# Patient Record
Sex: Male | Born: 1935 | Race: White | Hispanic: No | Marital: Married | Smoking: Former smoker
Health system: Southern US, Community
[De-identification: ages and names within clinical notes are randomized; demographics above are authoritative.]

## PROBLEM LIST (undated history)

## (undated) DIAGNOSIS — K219 Gastro-esophageal reflux disease without esophagitis: Secondary | ICD-10-CM

## (undated) DIAGNOSIS — F4310 Post-traumatic stress disorder, unspecified: Secondary | ICD-10-CM

## (undated) DIAGNOSIS — I6521 Occlusion and stenosis of right carotid artery: Secondary | ICD-10-CM

## (undated) DIAGNOSIS — J439 Emphysema, unspecified: Secondary | ICD-10-CM

## (undated) DIAGNOSIS — G5793 Unspecified mononeuropathy of bilateral lower limbs: Secondary | ICD-10-CM

## (undated) DIAGNOSIS — R0602 Shortness of breath: Secondary | ICD-10-CM

## (undated) DIAGNOSIS — G459 Transient cerebral ischemic attack, unspecified: Secondary | ICD-10-CM

## (undated) DIAGNOSIS — E785 Hyperlipidemia, unspecified: Secondary | ICD-10-CM

## (undated) DIAGNOSIS — J189 Pneumonia, unspecified organism: Secondary | ICD-10-CM

## (undated) DIAGNOSIS — D649 Anemia, unspecified: Secondary | ICD-10-CM

## (undated) DIAGNOSIS — N400 Enlarged prostate without lower urinary tract symptoms: Secondary | ICD-10-CM

## (undated) DIAGNOSIS — M1990 Unspecified osteoarthritis, unspecified site: Secondary | ICD-10-CM

## (undated) DIAGNOSIS — C801 Malignant (primary) neoplasm, unspecified: Secondary | ICD-10-CM

## (undated) DIAGNOSIS — J449 Chronic obstructive pulmonary disease, unspecified: Secondary | ICD-10-CM

## (undated) DIAGNOSIS — I10 Essential (primary) hypertension: Secondary | ICD-10-CM

## (undated) DIAGNOSIS — N3941 Urge incontinence: Secondary | ICD-10-CM

## (undated) DIAGNOSIS — L723 Sebaceous cyst: Secondary | ICD-10-CM

## (undated) HISTORY — DX: Gastro-esophageal reflux disease without esophagitis: K21.9

## (undated) HISTORY — PX: CYST EXCISION: SHX5701

## (undated) HISTORY — DX: Unspecified osteoarthritis, unspecified site: M19.90

## (undated) HISTORY — PX: SINUSOTOMY: SHX291

## (undated) HISTORY — DX: Sebaceous cyst: L72.3

## (undated) HISTORY — DX: Chronic obstructive pulmonary disease, unspecified: J44.9

## (undated) HISTORY — PX: HEMORRHOID SURGERY: SHX153

## (undated) HISTORY — DX: Emphysema, unspecified: J43.9

## (undated) HISTORY — DX: Anemia, unspecified: D64.9

## (undated) HISTORY — DX: Pneumonia, unspecified organism: J18.9

## (undated) HISTORY — DX: Essential (primary) hypertension: I10

## (undated) HISTORY — DX: Hyperlipidemia, unspecified: E78.5

---

## 2004-06-08 ENCOUNTER — Other Ambulatory Visit: Payer: Self-pay

## 2007-07-09 ENCOUNTER — Emergency Department: Payer: Self-pay | Admitting: Emergency Medicine

## 2008-07-27 ENCOUNTER — Ambulatory Visit: Payer: Self-pay | Admitting: Family Medicine

## 2009-07-11 ENCOUNTER — Ambulatory Visit: Payer: Self-pay | Admitting: Internal Medicine

## 2009-09-19 ENCOUNTER — Ambulatory Visit: Payer: Self-pay | Admitting: Gastroenterology

## 2010-04-25 ENCOUNTER — Ambulatory Visit: Payer: Self-pay | Admitting: Internal Medicine

## 2011-08-05 ENCOUNTER — Ambulatory Visit (INDEPENDENT_AMBULATORY_CARE_PROVIDER_SITE_OTHER): Payer: Medicare Other | Admitting: Internal Medicine

## 2011-08-05 DIAGNOSIS — Z23 Encounter for immunization: Secondary | ICD-10-CM

## 2011-08-14 ENCOUNTER — Other Ambulatory Visit: Payer: Self-pay | Admitting: Internal Medicine

## 2011-08-14 MED ORDER — AMLODIPINE BESYLATE 5 MG PO TABS: 5.0000 mg | ORAL_TABLET | Freq: Two times a day (BID) | ORAL | Status: DC

## 2011-08-14 NOTE — Telephone Encounter (Signed)
I phoned the Rx to [facility].

## 2011-08-14 NOTE — Telephone Encounter (Signed)
Patient stated pharmacy [facility]) faxed over a refill for amlodopine on Monday. Please call patient when complete. Patient upset and states that he always has issues with his refills. Please follow up.

## 2011-08-16 ENCOUNTER — Other Ambulatory Visit: Payer: Self-pay | Admitting: Internal Medicine

## 2011-09-10 ENCOUNTER — Ambulatory Visit (INDEPENDENT_AMBULATORY_CARE_PROVIDER_SITE_OTHER): Payer: Medicare Other | Admitting: Internal Medicine

## 2011-09-10 ENCOUNTER — Encounter: Payer: Self-pay | Admitting: Internal Medicine

## 2011-09-10 VITALS — BP 140/70 | HR 77 | Temp 98.6°F | Resp 16 | Ht 68.0 in | Wt 177.0 lb

## 2011-09-10 DIAGNOSIS — E785 Hyperlipidemia, unspecified: Secondary | ICD-10-CM

## 2011-09-10 DIAGNOSIS — I10 Essential (primary) hypertension: Secondary | ICD-10-CM

## 2011-09-10 MED ORDER — AMLODIPINE BESYLATE 5 MG PO TABS: 5.0000 mg | ORAL_TABLET | Freq: Two times a day (BID) | ORAL | Status: DC

## 2011-09-10 NOTE — Progress Notes (Signed)
  Subjective:    Patient ID: Donald [name], male    DOB: 1933-05-29, 77 y.o.   MRN: [id number]  HPI 77YO male with history of hypertension presents for followup. He reports that he's been feeling well. He denies any chest pain, palpitations, diaphoresis. He reports full compliance with his medications. When he checks his blood pressure, it typically runs less than 140/80. He notes occasional headache which he describes as fleeting lasting only a few minutes with no other associated symptoms. Headaches are described as mild and diffuse. He has not taken any medication for this.  Outpatient Encounter Prescriptions as of 09/10/2011  Medication Sig Dispense Refill  . amLODipine (NORVASC) 5 MG tablet Take 1 tablet (5 mg total) by mouth 2 (two) times daily.  180 tablet  4  . citalopram (CELEXA) 20 MG tablet Take 20 mg by mouth daily.        Marland Kitchen dutasteride (AVODART) 0.5 MG capsule Take 0.5 mg by mouth. Takes twice a week        . omeprazole (PRILOSEC) 20 MG capsule Take 20 mg by mouth daily.          Review of Systems  Constitutional: Negative for fever, chills, activity change, appetite change, fatigue and unexpected weight change.  Eyes: Negative for visual disturbance.  Respiratory: Negative for cough and shortness of breath.   Cardiovascular: Negative for chest pain, palpitations and leg swelling.  Gastrointestinal: Negative for abdominal pain and abdominal distention.  Genitourinary: Negative for dysuria, urgency and difficulty urinating.  Musculoskeletal: Negative for arthralgias and gait problem.  Skin: Negative for color change and rash.  Neurological: Positive for headaches (occasional, fleeting, does not take medication for).  Hematological: Negative for adenopathy.  Psychiatric/Behavioral: Negative for sleep disturbance and dysphoric mood. The patient is not nervous/anxious.    BP 140/70  Pulse 77  Temp(Src) 98.6 F (37 C) (Oral)  Resp 16  Ht 5\' 8"  (1.727 m)  Wt 177 lb (80.287  kg)  BMI 26.91 kg/m2  SpO2 99%     Objective:   Physical Exam  Constitutional: He is oriented to person, place, and time. He appears well-developed and well-nourished. No distress.  HENT:  Head: Normocephalic and atraumatic.  Right Ear: External ear normal.  Left Ear: External ear normal.  Nose: Nose normal.  Mouth/Throat: Oropharynx is clear and moist. No oropharyngeal exudate.  Eyes: Conjunctivae and EOM are normal. Pupils are equal, round, and reactive to light. Right eye exhibits no discharge. Left eye exhibits no discharge. No scleral icterus.  Neck: Normal range of motion. Neck supple. No tracheal deviation present. No thyromegaly present.  Cardiovascular: Normal rate, regular rhythm and normal heart sounds.  Exam reveals no gallop and no friction rub.   No murmur heard. Pulmonary/Chest: Effort normal and breath sounds normal. No respiratory distress. He has no wheezes. He has no rales. He exhibits no tenderness.  Musculoskeletal: Normal range of motion. He exhibits no edema.  Lymphadenopathy:    He has no cervical adenopathy.  Neurological: He is alert and oriented to person, place, and time. No cranial nerve deficit. Coordination normal.  Skin: Skin is warm and dry. No rash noted. He is not diaphoretic. No erythema. No pallor.  Psychiatric: He has a normal mood and affect. His behavior is normal. Judgment and thought content normal.          Assessment & Plan:  1. Hypertension - Well controlled on Amlodipine. Will check renal function with labs today. Follow up in 6 months.

## 2011-09-11 ENCOUNTER — Other Ambulatory Visit (INDEPENDENT_AMBULATORY_CARE_PROVIDER_SITE_OTHER): Payer: Medicare Other | Admitting: *Deleted

## 2011-09-11 DIAGNOSIS — E785 Hyperlipidemia, unspecified: Secondary | ICD-10-CM

## 2011-09-11 DIAGNOSIS — I10 Essential (primary) hypertension: Secondary | ICD-10-CM

## 2011-09-11 LAB — COMPREHENSIVE METABOLIC PANEL
ALT: 18 U/L (ref 0–53)
AST: 26 U/L (ref 0–37)
Albumin: 4.5 g/dL (ref 3.5–5.2)
Alkaline Phosphatase: 60 U/L (ref 39–117)
Potassium: 4.4 mEq/L (ref 3.5–5.1)
Sodium: 136 mEq/L (ref 135–145)
Total Bilirubin: 0.4 mg/dL (ref 0.3–1.2)
Total Protein: 7.8 g/dL (ref 6.0–8.3)

## 2011-09-11 LAB — CBC WITH DIFFERENTIAL/PLATELET
Basophils Absolute: 0.1 10*3/uL (ref 0.0–0.1)
Eosinophils Absolute: 0.6 10*3/uL (ref 0.0–0.7)
Eosinophils Relative: 5.4 % — ABNORMAL HIGH (ref 0.0–5.0)
MCHC: 33.2 g/dL (ref 30.0–36.0)
MCV: 90.3 fl (ref 78.0–100.0)
Monocytes Absolute: 0.6 10*3/uL (ref 0.1–1.0)
Neutrophils Relative %: 51.7 % (ref 43.0–77.0)
Platelets: 269 10*3/uL (ref 150.0–400.0)
RDW: 13.7 % (ref 11.5–14.6)
WBC: 10.3 10*3/uL (ref 4.5–10.5)

## 2011-09-11 LAB — LIPID PANEL
Total CHOL/HDL Ratio: 4
VLDL: 35.6 mg/dL (ref 0.0–40.0)

## 2011-09-11 LAB — LDL CHOLESTEROL, DIRECT: Direct LDL: 166.8 mg/dL

## 2011-09-13 ENCOUNTER — Telehealth: Payer: Self-pay | Admitting: *Deleted

## 2011-09-13 MED ORDER — ATORVASTATIN CALCIUM 20 MG PO TABS: 20.0000 mg | ORAL_TABLET | Freq: Every day | ORAL | Status: DC

## 2011-09-13 NOTE — Telephone Encounter (Signed)
Message copied by Vernie Murders on Fri Sep 13, 2011  6:59 PM ------      Message from: Ronna Polio A      Created: Fri Sep 13, 2011  4:13 PM       Start Lipitor 20mg  po daily, disp 30 with 3 refill. Repeat lipids and LFTs in 1 month.

## 2011-10-14 ENCOUNTER — Other Ambulatory Visit (INDEPENDENT_AMBULATORY_CARE_PROVIDER_SITE_OTHER): Payer: Medicare Other | Admitting: *Deleted

## 2011-10-14 DIAGNOSIS — E785 Hyperlipidemia, unspecified: Secondary | ICD-10-CM

## 2011-10-14 DIAGNOSIS — Z79899 Other long term (current) drug therapy: Secondary | ICD-10-CM

## 2011-10-14 LAB — LIPID PANEL
HDL: 51.3 mg/dL (ref >39.00)
Total CHOL/HDL Ratio: 3
Triglycerides: 116 mg/dL (ref 0.0–149.0)
VLDL: 23.2 mg/dL (ref 0.0–40.0)

## 2011-10-14 LAB — HEPATIC FUNCTION PANEL
Albumin: 4 g/dL (ref 3.5–5.2)
Alkaline Phosphatase: 61 U/L (ref 39–117)
Bilirubin, Direct: 0 mg/dL (ref 0.0–0.3)

## 2011-10-15 ENCOUNTER — Other Ambulatory Visit: Payer: Medicare Other

## 2011-10-22 ENCOUNTER — Telehealth: Payer: Self-pay | Admitting: *Deleted

## 2011-10-22 NOTE — Telephone Encounter (Signed)
Thanks. Cholesterol is excellent.

## 2011-10-22 NOTE — Telephone Encounter (Signed)
Wife was in for OV today and dropped off lab readings from the [place].  Total Cholesterol 153 LDL  71 HDL  58 Trig  118

## 2011-11-07 ENCOUNTER — Encounter: Payer: Self-pay | Admitting: Internal Medicine

## 2011-12-11 ENCOUNTER — Ambulatory Visit (INDEPENDENT_AMBULATORY_CARE_PROVIDER_SITE_OTHER): Payer: Medicare Other | Admitting: Internal Medicine

## 2011-12-11 ENCOUNTER — Encounter: Payer: Self-pay | Admitting: Internal Medicine

## 2011-12-11 DIAGNOSIS — F32A Depression, unspecified: Secondary | ICD-10-CM

## 2011-12-11 DIAGNOSIS — I10 Essential (primary) hypertension: Secondary | ICD-10-CM

## 2011-12-11 DIAGNOSIS — E7849 Other hyperlipidemia: Secondary | ICD-10-CM | POA: Insufficient documentation

## 2011-12-11 DIAGNOSIS — K219 Gastro-esophageal reflux disease without esophagitis: Secondary | ICD-10-CM

## 2011-12-11 DIAGNOSIS — F329 Major depressive disorder, single episode, unspecified: Secondary | ICD-10-CM

## 2011-12-11 DIAGNOSIS — E785 Hyperlipidemia, unspecified: Secondary | ICD-10-CM

## 2011-12-11 DIAGNOSIS — N400 Enlarged prostate without lower urinary tract symptoms: Secondary | ICD-10-CM

## 2011-12-11 MED ORDER — AMLODIPINE BESYLATE 5 MG PO TABS: 5.0000 mg | ORAL_TABLET | Freq: Two times a day (BID) | ORAL | Status: DC

## 2011-12-11 MED ORDER — OMEPRAZOLE 20 MG PO CPDR: 20.0000 mg | DELAYED_RELEASE_CAPSULE | Freq: Every day | ORAL | Status: DC

## 2011-12-11 MED ORDER — DUTASTERIDE 0.5 MG PO CAPS: 0.5000 mg | ORAL_CAPSULE | Freq: Every day | ORAL | Status: DC

## 2011-12-11 MED ORDER — CITALOPRAM HYDROBROMIDE 20 MG PO TABS: 20.0000 mg | ORAL_TABLET | Freq: Every day | ORAL | Status: DC

## 2011-12-11 MED ORDER — ATORVASTATIN CALCIUM 20 MG PO TABS: 20.0000 mg | ORAL_TABLET | Freq: Every day | ORAL | Status: DC

## 2011-12-11 NOTE — Progress Notes (Signed)
Subjective:    Patient ID: Donald [name], male    DOB: 12/28/1932, 78 y.o.   MRN: [id number]  HPI 78 year old male with history of hypertension and hyperlipidemia presents for followup. He reports that he has been feeling well. In regards to his blood pressure, he denies any recent chest pain, headache, or palpitations. He has been compliant with his medication. In regards to his cholesterol, he recently had lipid profile performed which showed marked improvement in his total cholesterol with reduction of 100 point and a total cholesterol of 145. His LDL cholesterol was 69. He has also been trying to improve his diet with decrease intake of saturated fat and increasing intake of fiber. He has been walking for exercise 2 miles daily. He denies any myalgia associated with statin medication.  Outpatient Encounter Prescriptions as of 12/11/2011  Medication Sig Dispense Refill  . amLODipine (NORVASC) 5 MG tablet Take 1 tablet (5 mg total) by mouth 2 (two) times daily.  180 tablet  4  . atorvastatin (LIPITOR) 20 MG tablet Take 1 tablet (20 mg total) by mouth daily.  30 tablet  6  . dutasteride (AVODART) 0.5 MG capsule Take 1 capsule (0.5 mg total) by mouth daily. Takes twice a week  30 capsule  6  . omeprazole (PRILOSEC) 20 MG capsule Take 1 capsule (20 mg total) by mouth daily.  30 capsule  6  . citalopram (CELEXA) 20 MG tablet Take 1 tablet (20 mg total) by mouth daily.  30 tablet  6    Review of Systems  Constitutional: Negative for fever, chills, activity change, appetite change, fatigue and unexpected weight change.  Eyes: Negative for visual disturbance.  Respiratory: Negative for cough and shortness of breath.   Cardiovascular: Negative for chest pain, palpitations and leg swelling.  Gastrointestinal: Negative for abdominal pain and abdominal distention.  Genitourinary: Negative for dysuria, urgency and difficulty urinating.  Musculoskeletal: Negative for arthralgias and gait problem.    Skin: Negative for color change and rash.  Hematological: Negative for adenopathy.  Psychiatric/Behavioral: Negative for sleep disturbance and dysphoric mood. The patient is not nervous/anxious.    BP 120/66  Pulse 80  Temp 98.6 F (37 C)  Resp 14  Ht 5\' 8"  (1.727 m)  Wt 184 lb 4 oz (83.575 kg)  BMI 28.02 kg/m2  SpO2 99%     Objective:   Physical Exam  Constitutional: He is oriented to person, place, and time. He appears well-developed and well-nourished. No distress.  HENT:  Head: Normocephalic and atraumatic.  Right Ear: External ear normal.  Left Ear: External ear normal.  Nose: Nose normal.  Mouth/Throat: Oropharynx is clear and moist. No oropharyngeal exudate.  Eyes: Conjunctivae and EOM are normal. Pupils are equal, round, and reactive to light. Right eye exhibits no discharge. Left eye exhibits no discharge. No scleral icterus.  Neck: Normal range of motion. Neck supple. No tracheal deviation present. No thyromegaly present.  Cardiovascular: Normal rate, regular rhythm and normal heart sounds.  Exam reveals no gallop and no friction rub.   No murmur heard. Pulmonary/Chest: Effort normal and breath sounds normal. No respiratory distress. He has no wheezes. He has no rales. He exhibits no tenderness.  Musculoskeletal: Normal range of motion. He exhibits no edema.  Lymphadenopathy:    He has no cervical adenopathy.  Neurological: He is alert and oriented to person, place, and time. No cranial nerve deficit. Coordination normal.  Skin: Skin is warm and dry. No rash noted. He is not diaphoretic.  No erythema. No pallor.  Psychiatric: He has a normal mood and affect. His behavior is normal. Judgment and thought content normal.          Assessment & Plan:

## 2011-12-11 NOTE — Assessment & Plan Note (Signed)
BP well controlled. Will continue current medications. Follow up with renal function in 3 months.

## 2011-12-11 NOTE — Assessment & Plan Note (Signed)
Lipids extremely well controlled with diet, exercise, and medication. Will continue. Repeat lipids in 1 year, LFTs in 02/2012.

## 2011-12-23 ENCOUNTER — Encounter: Payer: Self-pay | Admitting: Internal Medicine

## 2011-12-23 ENCOUNTER — Ambulatory Visit (INDEPENDENT_AMBULATORY_CARE_PROVIDER_SITE_OTHER): Payer: Medicare Other | Admitting: Internal Medicine

## 2011-12-23 VITALS — BP 122/52 | HR 72 | Temp 98.0°F | Ht 67.5 in | Wt 180.0 lb

## 2011-12-23 DIAGNOSIS — F4310 Post-traumatic stress disorder, unspecified: Secondary | ICD-10-CM

## 2011-12-23 DIAGNOSIS — E785 Hyperlipidemia, unspecified: Secondary | ICD-10-CM

## 2011-12-23 MED ORDER — ATORVASTATIN CALCIUM 20 MG PO TABS: 20.0000 mg | ORAL_TABLET | Freq: Every day | ORAL | Status: DC

## 2011-12-23 MED ORDER — BUPROPION HCL ER (XL) 150 MG PO TB24: 150.0000 mg | ORAL_TABLET | Freq: Every day | ORAL | Status: DC

## 2011-12-23 NOTE — Progress Notes (Signed)
Subjective:    Patient ID: Donald [name], male    DOB: 04-Mar-1933, 78 y.o.   MRN: [id number]  HPI 78YO male with h/o PTSD presents for acute visit.  He first developed PTSD after [place].  He has been previously treated at the [facility] for this.  He experiences irritability and anxiety associated with PTSD. He had been taking Celexa with improvement in symptoms until recently when the [place] insisted on reducing dose of Celexa to 20mg  daily because of his age.  He now has recurrent irritability and anxiety.  He would like to try another medication or increase his dose.  He notes impotence with Celexa, however so would be open to changing medications in effort to improve this.  Outpatient Encounter Prescriptions as of 12/23/2011  Medication Sig Dispense Refill  . amLODipine (NORVASC) 5 MG tablet Take 1 tablet (5 mg total) by mouth 2 (two) times daily.  180 tablet  4  . atorvastatin (LIPITOR) 20 MG tablet Take 1 tablet (20 mg total) by mouth daily.  30 tablet  6  . citalopram (CELEXA) 20 MG tablet Take 1 tablet (20 mg total) by mouth daily.  30 tablet  6  . dutasteride (AVODART) 0.5 MG capsule Take 0.5 mg by mouth 2 (two) times a week.      . Multiple Vitamins-Minerals (MEGA MULTIVITAMIN FOR MEN) TABS Take 2 capsules by mouth daily.      Marland Kitchen omeprazole (PRILOSEC) 20 MG capsule Take 1 capsule (20 mg total) by mouth daily.  30 capsule  6  . Tamsulosin HCl (FLOMAX) 0.4 MG CAPS Take 0.4 mg by mouth daily.      . valACYclovir (VALTREX) 500 MG tablet Take 500 mg by mouth every other day.      Marland Kitchen DISCONTD: atorvastatin (LIPITOR) 20 MG tablet Take 1 tablet (20 mg total) by mouth daily.  30 tablet  6  . DISCONTD: dutasteride (AVODART) 0.5 MG capsule Take 1 capsule (0.5 mg total) by mouth daily. Takes twice a week  30 capsule  6  . buPROPion (WELLBUTRIN XL) 150 MG 24 hr tablet Take 1 tablet (150 mg total) by mouth daily.  30 tablet  3    Review of Systems  Constitutional: Negative for fever, chills and  fatigue.  Respiratory: Negative for cough and shortness of breath.   Cardiovascular: Negative for chest pain.  Psychiatric/Behavioral: Positive for dysphoric mood and agitation. Negative for suicidal ideas and sleep disturbance. The patient is nervous/anxious.    BP 122/52  Pulse 72  Temp(Src) 98 F (36.7 C) (Oral)  Ht 5' 7.5" (1.715 m)  Wt 180 lb (81.647 kg)  BMI 27.78 kg/m2  SpO2 97%     Objective:   Physical Exam  Constitutional: He is oriented to person, place, and time. He appears well-developed and well-nourished. No distress.  HENT:  Head: Normocephalic and atraumatic.  Right Ear: External ear normal.  Left Ear: External ear normal.  Nose: Nose normal.  Mouth/Throat: Oropharynx is clear and moist. No oropharyngeal exudate.  Eyes: Conjunctivae and EOM are normal. Pupils are equal, round, and reactive to light. Right eye exhibits no discharge. Left eye exhibits no discharge. No scleral icterus.  Neck: Normal range of motion. Neck supple. No tracheal deviation present. No thyromegaly present.  Cardiovascular: Normal rate, regular rhythm and normal heart sounds.  Exam reveals no gallop and no friction rub.   No murmur heard. Pulmonary/Chest: Effort normal and breath sounds normal. No respiratory distress. He has no wheezes. He  has no rales. He exhibits no tenderness.  Musculoskeletal: Normal range of motion. He exhibits no edema.  Lymphadenopathy:    He has no cervical adenopathy.  Neurological: He is alert and oriented to person, place, and time. No cranial nerve deficit. Coordination normal.  Skin: Skin is warm and dry. No rash noted. He is not diaphoretic. No erythema. No pallor.  Psychiatric: He has a normal mood and affect. His behavior is normal. Judgment and thought content normal.          Assessment & Plan:

## 2011-12-23 NOTE — Assessment & Plan Note (Signed)
Symptoms worsened after decreasing dose of Celexa. Will try changing to Wellbutrin. Start by adding 150mg  wellbutrin daily.  If doing well, stop Celexa after 1 week. Follow up 1 month or sooner if any problems.

## 2012-01-23 ENCOUNTER — Ambulatory Visit: Payer: Medicare Other | Admitting: Internal Medicine

## 2012-01-23 ENCOUNTER — Ambulatory Visit (INDEPENDENT_AMBULATORY_CARE_PROVIDER_SITE_OTHER): Payer: Medicare Other | Admitting: Internal Medicine

## 2012-01-23 ENCOUNTER — Encounter: Payer: Self-pay | Admitting: Internal Medicine

## 2012-01-23 VITALS — BP 128/50 | HR 57 | Temp 97.4°F | Ht 68.0 in | Wt 180.0 lb

## 2012-01-23 DIAGNOSIS — F4310 Post-traumatic stress disorder, unspecified: Secondary | ICD-10-CM

## 2012-01-23 NOTE — Assessment & Plan Note (Signed)
Symptoms well controlled using Wellbutrin and Celexa. Will continue to monitor. Followup in 3 months.

## 2012-01-23 NOTE — Progress Notes (Signed)
Subjective:    Patient ID: Donald [name], male    DOB: Oct 24, 1933, 78 y.o.   MRN: [id number]  HPI 78 year old male with history of post-traumatic stress disorder presents for followup. We recently changed his medication and added Wellbutrin in the morning. He reports that he has been taking Wellbutrin in the morning and Celexa at night. He reports excellent control of his symptoms, including anxiety, with use of these medications. He denies any noted side effects from the medicines. He denies any concerns today.  Outpatient Encounter Prescriptions as of 01/23/2012  Medication Sig Dispense Refill  . amLODipine (NORVASC) 5 MG tablet Take 1 tablet (5 mg total) by mouth 2 (two) times daily.  180 tablet  4  . atorvastatin (LIPITOR) 20 MG tablet Take 1 tablet (20 mg total) by mouth daily.  30 tablet  6  . buPROPion (WELLBUTRIN XL) 150 MG 24 hr tablet Take 1 tablet (150 mg total) by mouth daily.  30 tablet  3  . citalopram (CELEXA) 20 MG tablet Take 1 tablet (20 mg total) by mouth daily.  30 tablet  6  . dutasteride (AVODART) 0.5 MG capsule Take 0.5 mg by mouth 2 (two) times a week.      . Multiple Vitamins-Minerals (MEGA MULTIVITAMIN FOR MEN) TABS Take 2 capsules by mouth daily.      Marland Kitchen omeprazole (PRILOSEC) 20 MG capsule Take 1 capsule (20 mg total) by mouth daily.  30 capsule  6  . Tamsulosin HCl (FLOMAX) 0.4 MG CAPS Take 0.4 mg by mouth daily.      . valACYclovir (VALTREX) 500 MG tablet Take 500 mg by mouth every other day.        Review of Systems  Constitutional: Negative for fever, chills, activity change, appetite change, fatigue and unexpected weight change.  Eyes: Negative for visual disturbance.  Respiratory: Negative for cough and shortness of breath.   Cardiovascular: Negative for chest pain, palpitations and leg swelling.  Gastrointestinal: Negative for abdominal pain and abdominal distention.  Genitourinary: Negative for dysuria, urgency and difficulty urinating.    Musculoskeletal: Negative for arthralgias and gait problem.  Skin: Negative for color change and rash.  Hematological: Negative for adenopathy.  Psychiatric/Behavioral: Negative for sleep disturbance and dysphoric mood. The patient is not nervous/anxious.    BP 128/50  Pulse 57  Temp(Src) 97.4 F (36.3 C) (Oral)  Ht 5\' 8"  (1.727 m)  Wt 180 lb (81.647 kg)  BMI 27.37 kg/m2  SpO2 98%     Objective:   Physical Exam  Constitutional: He is oriented to person, place, and time. He appears well-developed and well-nourished. No distress.  HENT:  Head: Normocephalic and atraumatic.  Right Ear: External ear normal.  Left Ear: External ear normal.  Nose: Nose normal.  Mouth/Throat: Oropharynx is clear and moist. No oropharyngeal exudate.  Eyes: Conjunctivae and EOM are normal. Pupils are equal, round, and reactive to light. Right eye exhibits no discharge. Left eye exhibits no discharge. No scleral icterus.  Neck: Normal range of motion. Neck supple. No tracheal deviation present. No thyromegaly present.  Cardiovascular: Normal rate, regular rhythm and normal heart sounds.  Exam reveals no gallop and no friction rub.   No murmur heard. Pulmonary/Chest: Effort normal and breath sounds normal. No respiratory distress. He has no wheezes. He has no rales. He exhibits no tenderness.  Musculoskeletal: Normal range of motion. He exhibits no edema.  Lymphadenopathy:    He has no cervical adenopathy.  Neurological: He is alert and oriented to  person, place, and time. No cranial nerve deficit. Coordination normal.  Skin: Skin is warm and dry. No rash noted. He is not diaphoretic. No erythema. No pallor.  Psychiatric: He has a normal mood and affect. His behavior is normal. Judgment and thought content normal.          Assessment & Plan:

## 2012-03-12 ENCOUNTER — Telehealth: Payer: Self-pay | Admitting: Internal Medicine

## 2012-03-12 ENCOUNTER — Ambulatory Visit: Payer: Medicare Other | Admitting: Internal Medicine

## 2012-03-12 MED ORDER — VALACYCLOVIR HCL 500 MG PO TABS: 500.0000 mg | ORAL_TABLET | ORAL | Status: DC

## 2012-03-12 NOTE — Telephone Encounter (Signed)
Patient notified, Rx has been called in. 

## 2012-03-12 NOTE — Telephone Encounter (Signed)
[phone] Pt has ? On dosage of meds valacycolvir Please advise pt

## 2012-03-12 NOTE — Telephone Encounter (Signed)
Patient stated valacyclovir was changed to take one tablet twice a week, he said he still has outbreaks with that and wants it changed back to take it every other day.  Please advise.

## 2012-03-12 NOTE — Telephone Encounter (Signed)
Fine to change back to every other day and refill 1 month supply

## 2012-03-16 ENCOUNTER — Telehealth: Payer: Self-pay | Admitting: Internal Medicine

## 2012-03-16 NOTE — Telephone Encounter (Signed)
This was filled on on 03-12-12. I called and spoke with pharmacy and they said that they did receive this on 5-2 and that they are not sure where the patient go the information that it had not been called in. I called patient back and notified him that this was called in.

## 2012-03-16 NOTE — Telephone Encounter (Signed)
[phone] Pt called to check on his rx for Donald [name] Valacyclovir Pt stated he call the drug store this morning and they told him they had not heard from [name]

## 2012-04-24 ENCOUNTER — Other Ambulatory Visit: Payer: Self-pay | Admitting: *Deleted

## 2012-04-24 DIAGNOSIS — F4310 Post-traumatic stress disorder, unspecified: Secondary | ICD-10-CM

## 2012-04-26 MED ORDER — BUPROPION HCL ER (XL) 150 MG PO TB24: 150.0000 mg | ORAL_TABLET | Freq: Every day | ORAL | Status: DC

## 2012-08-19 ENCOUNTER — Ambulatory Visit (INDEPENDENT_AMBULATORY_CARE_PROVIDER_SITE_OTHER): Payer: Medicare Other

## 2012-08-19 DIAGNOSIS — Z23 Encounter for immunization: Secondary | ICD-10-CM

## 2012-09-11 DIAGNOSIS — R972 Elevated prostate specific antigen [PSA]: Secondary | ICD-10-CM | POA: Insufficient documentation

## 2012-09-11 DIAGNOSIS — N3941 Urge incontinence: Secondary | ICD-10-CM | POA: Insufficient documentation

## 2012-09-11 DIAGNOSIS — N403 Nodular prostate with lower urinary tract symptoms: Secondary | ICD-10-CM

## 2012-09-11 DIAGNOSIS — N529 Male erectile dysfunction, unspecified: Secondary | ICD-10-CM | POA: Insufficient documentation

## 2012-09-11 DIAGNOSIS — E291 Testicular hypofunction: Secondary | ICD-10-CM | POA: Insufficient documentation

## 2012-09-11 DIAGNOSIS — N411 Chronic prostatitis: Secondary | ICD-10-CM | POA: Insufficient documentation

## 2012-09-11 DIAGNOSIS — N138 Other obstructive and reflux uropathy: Secondary | ICD-10-CM | POA: Insufficient documentation

## 2012-09-11 DIAGNOSIS — R339 Retention of urine, unspecified: Secondary | ICD-10-CM | POA: Insufficient documentation

## 2012-09-11 DIAGNOSIS — A6001 Herpesviral infection of penis: Secondary | ICD-10-CM | POA: Insufficient documentation

## 2012-10-12 ENCOUNTER — Other Ambulatory Visit: Payer: Self-pay | Admitting: General Practice

## 2012-10-12 ENCOUNTER — Telehealth: Payer: Self-pay | Admitting: General Practice

## 2012-10-12 MED ORDER — CLOBETASOL PROP EMOLLIENT BASE 0.05 % EX CREA: TOPICAL_CREAM | CUTANEOUS | Status: DC

## 2012-10-12 NOTE — Telephone Encounter (Signed)
Fine to fill as faxed 3 refill

## 2012-10-12 NOTE — Telephone Encounter (Signed)
Medication Request for Clobetasol. Ok to fill and with what sig and dose?

## 2012-10-12 NOTE — Telephone Encounter (Signed)
Med filled.  

## 2012-11-20 ENCOUNTER — Encounter: Payer: Self-pay | Admitting: Internal Medicine

## 2012-11-20 ENCOUNTER — Ambulatory Visit (INDEPENDENT_AMBULATORY_CARE_PROVIDER_SITE_OTHER): Payer: Medicare Other | Admitting: Internal Medicine

## 2012-11-20 VITALS — BP 135/60 | HR 49 | Temp 97.8°F | Ht 68.0 in | Wt 196.0 lb

## 2012-11-20 DIAGNOSIS — L301 Dyshidrosis [pompholyx]: Secondary | ICD-10-CM

## 2012-11-20 MED ORDER — CLOBETASOL PROP EMOLLIENT BASE 0.05 % EX CREA: 1.0000 "application " | TOPICAL_CREAM | Freq: Two times a day (BID) | CUTANEOUS | Status: DC

## 2012-11-20 NOTE — Progress Notes (Signed)
Subjective:    Patient ID: Donald [name], male    DOB: [birth year], 79 y.o.   MRN: [id number]  HPI 79 year old male presents for acute visit complaining of intermittent peeling and pain over his hands. He reports that the symptoms typically occur during the winter months. They're described as peeling and itchy skin over his hands, particularly over his arms. He has been using clobetasol on occasion with some improvement in symptoms. He does not use any specific hand cleansers moisturizers.  Outpatient Encounter Prescriptions as of 11/20/2012  Medication Sig Dispense Refill  . amLODipine (NORVASC) 5 MG tablet Take 1 tablet (5 mg total) by mouth 2 (two) times daily.  180 tablet  4  . atorvastatin (LIPITOR) 20 MG tablet Take 1 tablet (20 mg total) by mouth daily.  30 tablet  6  . citalopram (CELEXA) 20 MG tablet Take 1 tablet (20 mg total) by mouth daily.  30 tablet  6  . Clobetasol Prop Emollient Base (CLOBETASOL PROPIONATE E) 0.05 % emollient cream Apply 1 application topically 2 (two) times daily. Use topically as needed.  60 g  1  . dutasteride (AVODART) 0.5 MG capsule Take 0.5 mg by mouth 2 (two) times a week.      . fluocinonide cream (LIDEX) 0.05 % Apply topically 2 (two) times daily.      . Multiple Vitamins-Minerals (MEGA MULTIVITAMIN FOR MEN) TABS Take 2 capsules by mouth daily.      Marland Kitchen omeprazole (PRILOSEC) 20 MG capsule Take 1 capsule (20 mg total) by mouth daily.  30 capsule  6  . Tamsulosin HCl (FLOMAX) 0.4 MG CAPS Take 0.4 mg by mouth daily.      . valACYclovir (VALTREX) 500 MG tablet Take 1 tablet (500 mg total) by mouth every other day.  15 tablet  0  . [DISCONTINUED] Clobetasol Prop Emollient Base (CLOBETASOL PROPIONATE E) 0.05 % emollient cream Use topically as needed.  30 g  0  . buPROPion (WELLBUTRIN XL) 150 MG 24 hr tablet Take 1 tablet (150 mg total) by mouth daily.  30 tablet  3   BP 135/60  Pulse 49  Temp 97.8 F (36.6 C) (Oral)  Ht 5\' 8"  (1.727 m)  Wt 196 lb (88.905  kg)  BMI 29.80 kg/m2  SpO2 99%  Review of Systems  Constitutional: Negative for fever, chills, activity change, appetite change, fatigue and unexpected weight change.  Eyes: Negative for visual disturbance.  Respiratory: Negative for cough and shortness of breath.   Cardiovascular: Negative for chest pain, palpitations and leg swelling.  Gastrointestinal: Negative for abdominal pain and abdominal distention.  Genitourinary: Negative for dysuria, urgency and difficulty urinating.  Musculoskeletal: Negative for arthralgias and gait problem.  Skin: Positive for rash. Negative for color change.  Hematological: Negative for adenopathy.  Psychiatric/Behavioral: Negative for sleep disturbance and dysphoric mood. The patient is not nervous/anxious.        Objective:   Physical Exam  Constitutional: He is oriented to person, place, and time. He appears well-developed and well-nourished. No distress.  HENT:  Head: Normocephalic and atraumatic.  Right Ear: External ear normal.  Left Ear: External ear normal.  Nose: Nose normal.  Mouth/Throat: Oropharynx is clear and moist. No oropharyngeal exudate.  Eyes: Conjunctivae normal and EOM are normal. Pupils are equal, round, and reactive to light. Right eye exhibits no discharge. Left eye exhibits no discharge. No scleral icterus.  Neck: Normal range of motion. Neck supple. No tracheal deviation present. No thyromegaly present.  Cardiovascular: Normal  rate, regular rhythm and normal heart sounds.  Exam reveals no gallop and no friction rub.   No murmur heard. Pulmonary/Chest: Effort normal and breath sounds normal. No respiratory distress. He has no wheezes. He has no rales. He exhibits no tenderness.  Musculoskeletal: Normal range of motion. He exhibits no edema.  Lymphadenopathy:    He has no cervical adenopathy.  Neurological: He is alert and oriented to person, place, and time. No cranial nerve deficit. Coordination normal.  Skin: Skin is warm  and dry. Rash (over bilateral hands, most prominent over palms) noted. Rash is macular and maculopapular. He is not diaphoretic. There is erythema. No pallor.  Psychiatric: He has a normal mood and affect. His behavior is normal. Judgment and thought content normal.          Assessment & Plan:

## 2012-11-20 NOTE — Patient Instructions (Addendum)
Wash hands with Cetaphil. Avoid hand sanitizers. Apply Vanicream or Eucerin as a moisturizer at least twice daily. Apply Clobetasol twice daily.  Hand Dermatitis Hand dermatitis (dyshidrotic eczema) is a skin condition in which small, itchy, raised dots or fluid-filled blisters form over the palms of the hands. Outbreaks of hand dermatitis can last 3 to 4 weeks. CAUSES  The cause of hand dermatitis is unknown. However, it occurs most often in patients with a history of allergies such as:  Hay fever.  Allergic asthma.  Allergies to latex. Chemical exposure, injuries, and environmental irritants can make hand dermatitis worse. Washing your hands too frequently can remove natural oils, which can dry out the skin and contribute to outbreaks of hand dermatitis. SYMPTOMS  The most common symptom of hand dermatitis is intense itching. Cracks or grooves (fissures) on the fingers can also develop. Affected areas can be painful, especially areas where large blisters have formed. DIAGNOSIS Your caregiver can usually tell what the problem is by doing a physical exam. PREVENTION  Avoid excessive hand washing.  Avoid the use of harsh chemicals.  Wear protective gloves when handling products that can irritate your skin. TREATMENT  Steroid creams and ointments, such as over-the-counter 1% hydrocortisone cream, can reduce inflammation and improve moisture retention. These should be applied at least 2 to 4 times per day. Your caregiver may ask you to use a stronger prescription steroid cream to help speed the healing of blistered and cracked skin. In severe cases, oral steroid medicine may be needed. If you have an infection, antibiotics may be needed. Your caregiver may also prescribe antihistamines. These medicines help reduce itching. HOME CARE INSTRUCTIONS  Only take over-the-counter or prescription medicines as directed by your caregiver.  You may use wet or cold compresses. This can  help:  Alleviate itching.  Increase the effectiveness of topical creams.  Minimize blisters. SEEK MEDICAL CARE IF:  The rash is not better after 1 week of treatment.  Signs of infection develop, such as redness, tenderness, or yellowish-white fluid (pus).  The rash is spreading. Document Released: 10/28/2005 Document Revised: 01/20/2012 Document Reviewed: 03/27/2011 [facility] Patient Information 2013 [place], [place].

## 2012-11-20 NOTE — Assessment & Plan Note (Signed)
Symptoms most consistent with dyshidrotic eczema. Recommended use of gentle cleanser such as cetaphil. Recommended avoidance of harsh cleansers and hand sanitizer. Will treat with Clobetasol twice daily x 1 week. Will use moisturizing cream such as Vanicream. If no improvement, discussed use of Tacrolimus. Follow up prn.

## 2012-11-30 ENCOUNTER — Ambulatory Visit (INDEPENDENT_AMBULATORY_CARE_PROVIDER_SITE_OTHER): Payer: Medicare Other | Admitting: Internal Medicine

## 2012-11-30 ENCOUNTER — Encounter: Payer: Self-pay | Admitting: Internal Medicine

## 2012-11-30 VITALS — BP 112/60 | HR 56 | Temp 99.2°F | Ht 68.0 in | Wt 202.5 lb

## 2012-11-30 DIAGNOSIS — J069 Acute upper respiratory infection, unspecified: Secondary | ICD-10-CM

## 2012-11-30 DIAGNOSIS — J40 Bronchitis, not specified as acute or chronic: Secondary | ICD-10-CM

## 2012-11-30 MED ORDER — AMOXICILLIN-POT CLAVULANATE 875-125 MG PO TABS: 1.0000 | ORAL_TABLET | Freq: Two times a day (BID) | ORAL | Status: DC

## 2012-11-30 MED ORDER — HYDROCOD POLST-CHLORPHEN POLST 10-8 MG/5ML PO LQCR: 5.0000 mL | Freq: Two times a day (BID) | ORAL | Status: DC | PRN

## 2012-11-30 MED ORDER — BENZONATATE 200 MG PO CAPS: 200.0000 mg | ORAL_CAPSULE | Freq: Two times a day (BID) | ORAL | Status: DC | PRN

## 2012-11-30 NOTE — Progress Notes (Signed)
Subjective:    Patient ID: Donald [name], male    DOB: 06-10-1933, 79 y.o.   MRN: [id number]  HPI 79 year old male presents for acute visit complaining of three-day history of fever, chills, general malaise, fatigue, runny nose, and cough. He reports that symptoms began about 3 days ago with fever and chills. Over the weekend, he reports feeling extremely fatigued with cough that was initially productive of clear sputum which is now more purulent. He denies shortness of breath or chest pain. He denies myalgia. He denies any known sick contacts. He has been taking Tussionex at night with improvement in cough. He reports that, in general, symptoms seem slightly improved today.  Outpatient Encounter Prescriptions as of 11/30/2012  Medication Sig Dispense Refill  . amLODipine (NORVASC) 5 MG tablet Take 1 tablet (5 mg total) by mouth 2 (two) times daily.  180 tablet  4  . atorvastatin (LIPITOR) 20 MG tablet Take 1 tablet (20 mg total) by mouth daily.  30 tablet  6  . buPROPion (WELLBUTRIN XL) 150 MG 24 hr tablet Take 1 tablet (150 mg total) by mouth daily.  30 tablet  3  . citalopram (CELEXA) 20 MG tablet Take 10 mg by mouth daily.      . Clobetasol Prop Emollient Base (CLOBETASOL PROPIONATE E) 0.05 % emollient cream Apply 1 application topically 2 (two) times daily. Use topically as needed.  60 g  1  . dutasteride (AVODART) 0.5 MG capsule Take 0.5 mg by mouth 2 (two) times a week.      . fluocinonide cream (LIDEX) 0.05 % Apply topically 2 (two) times daily.      . Multiple Vitamins-Minerals (MEGA MULTIVITAMIN FOR MEN) TABS Take 2 capsules by mouth daily.      Marland Kitchen omeprazole (PRILOSEC) 20 MG capsule Take 1 capsule (20 mg total) by mouth daily.  30 capsule  6  . Tamsulosin HCl (FLOMAX) 0.4 MG CAPS Take 0.4 mg by mouth daily.      . valACYclovir (VALTREX) 500 MG tablet Take 1 tablet (500 mg total) by mouth every other day.  15 tablet  0  . [DISCONTINUED] citalopram (CELEXA) 20 MG tablet Take 1 tablet  (20 mg total) by mouth daily.  30 tablet  6  . amoxicillin-clavulanate (AUGMENTIN) 875-125 MG per tablet Take 1 tablet by mouth 2 (two) times daily.  20 tablet  0  . benzonatate (TESSALON) 200 MG capsule Take 1 capsule (200 mg total) by mouth 2 (two) times daily as needed for cough.  20 capsule  0  . chlorpheniramine-HYDROcodone (TUSSIONEX) 10-8 MG/5ML LQCR Take 5 mLs by mouth every 12 (twelve) hours as needed.  200 mL  0   BP 112/60  Pulse 56  Temp 99.2 F (37.3 C) (Oral)  Ht 5\' 8"  (1.727 m)  Wt 202 lb 8 oz (91.853 kg)  BMI 30.79 kg/m2  SpO2 97%  Review of Systems  Constitutional: Positive for fever, chills and fatigue. Negative for activity change.  HENT: Positive for congestion, rhinorrhea and postnasal drip. Negative for hearing loss, ear pain, nosebleeds, sore throat, sneezing, trouble swallowing, neck pain, neck stiffness, voice change, sinus pressure, tinnitus and ear discharge.   Eyes: Negative for discharge, redness, itching and visual disturbance.  Respiratory: Positive for cough. Negative for chest tightness, shortness of breath, wheezing and stridor.   Cardiovascular: Negative for chest pain and leg swelling.  Musculoskeletal: Negative for myalgias and arthralgias.  Skin: Negative for color change and rash.  Neurological: Negative for dizziness, facial  asymmetry and headaches.  Psychiatric/Behavioral: Negative for sleep disturbance.       Objective:   Physical Exam  Constitutional: He is oriented to person, place, and time. He appears well-developed and well-nourished. No distress.  HENT:  Head: Normocephalic and atraumatic.  Right Ear: External ear normal.  Left Ear: External ear normal.  Nose: Nose normal.  Mouth/Throat: Oropharynx is clear and moist. No oropharyngeal exudate.  Eyes: Conjunctivae normal and EOM are normal. Pupils are equal, round, and reactive to light. Right eye exhibits no discharge. Left eye exhibits no discharge. No scleral icterus.  Neck:  Normal range of motion. Neck supple. No tracheal deviation present. No thyromegaly present.  Cardiovascular: Normal rate, regular rhythm and normal heart sounds.  Exam reveals no gallop and no friction rub.   No murmur heard. Pulmonary/Chest: Effort normal and breath sounds normal. No accessory muscle usage. Not tachypneic. No respiratory distress. He has no decreased breath sounds. He has no wheezes. He has no rhonchi. He has no rales. He exhibits no tenderness.  Musculoskeletal: Normal range of motion. He exhibits no edema.  Lymphadenopathy:    He has no cervical adenopathy.  Neurological: He is alert and oriented to person, place, and time. No cranial nerve deficit. Coordination normal.  Skin: Skin is warm and dry. No rash noted. He is not diaphoretic. No erythema. No pallor.  Psychiatric: He has a normal mood and affect. His behavior is normal. Judgment and thought content normal.          Assessment & Plan:

## 2012-11-30 NOTE — Assessment & Plan Note (Signed)
Symptoms are most consistent with viral upper respiratory infection with cough. Encouraged patient to continue to use Tussionex as needed at night for cough. He will use Tessalon during the day as needed. He will use over-the-counter antihistamines. Recommended increased fluid intake and rest. If symptoms are persistent with productive cough, shortness of breath, and fever over the next 48 hours, he will start Augmentin for secondary bronchitis. He will e-mail with an update in 48 hours.

## 2012-12-10 ENCOUNTER — Other Ambulatory Visit: Payer: Self-pay | Admitting: *Deleted

## 2012-12-10 DIAGNOSIS — E785 Hyperlipidemia, unspecified: Secondary | ICD-10-CM

## 2012-12-10 MED ORDER — ATORVASTATIN CALCIUM 20 MG PO TABS: 20.0000 mg | ORAL_TABLET | Freq: Every day | ORAL | Status: DC

## 2012-12-10 NOTE — Telephone Encounter (Signed)
Med filled.  

## 2013-02-10 ENCOUNTER — Encounter: Payer: Self-pay | Admitting: Internal Medicine

## 2013-02-10 ENCOUNTER — Ambulatory Visit (INDEPENDENT_AMBULATORY_CARE_PROVIDER_SITE_OTHER): Payer: Medicare Other | Admitting: Internal Medicine

## 2013-02-10 VITALS — BP 120/60 | HR 52 | Temp 97.8°F | Wt 195.0 lb

## 2013-02-10 DIAGNOSIS — F4310 Post-traumatic stress disorder, unspecified: Secondary | ICD-10-CM

## 2013-02-10 MED ORDER — CITALOPRAM HYDROBROMIDE 20 MG PO TABS: 20.0000 mg | ORAL_TABLET | Freq: Every day | ORAL | Status: DC

## 2013-02-10 NOTE — Progress Notes (Signed)
Subjective:    Patient ID: Donald [name], male    DOB: Mar 12, 1933, 79 y.o.   MRN: [id number]  HPI 79 year old male with history of PTSD presents for acute visit. He is also followed at the [facility]. He reports that they have tried to taper down his dose of citalopram from 20 mg to 5 mg. With that taper, he has developed increased irritability, outbursts of anger, and depression. He would like to resume 20 mg dose of citalopram. He reports that symptoms had previously been well-controlled.  Outpatient Encounter Prescriptions as of 02/10/2013  Medication Sig Dispense Refill  . amLODipine (NORVASC) 5 MG tablet Take 1 tablet (5 mg total) by mouth 2 (two) times daily.  180 tablet  4  . atorvastatin (LIPITOR) 20 MG tablet Take 1 tablet (20 mg total) by mouth daily.  30 tablet  6  . citalopram (CELEXA) 20 MG tablet Take 1 tablet (20 mg total) by mouth daily.  90 tablet  3  . clobetasol cream (TEMOVATE) 0.05 %       . Clobetasol Prop Emollient Base (CLOBETASOL PROPIONATE E) 0.05 % emollient cream Apply 1 application topically 2 (two) times daily. Use topically as needed.  60 g  1  . dutasteride (AVODART) 0.5 MG capsule Take 0.5 mg by mouth 2 (two) times a week.      . fluocinonide cream (LIDEX) 0.05 % Apply topically 2 (two) times daily.      . Multiple Vitamins-Minerals (MEGA MULTIVITAMIN FOR MEN) TABS Take 2 capsules by mouth daily.      Marland Kitchen omeprazole (PRILOSEC) 20 MG capsule Take 1 capsule (20 mg total) by mouth daily.  30 capsule  6  . Tamsulosin HCl (FLOMAX) 0.4 MG CAPS Take 0.4 mg by mouth daily.      . valACYclovir (VALTREX) 500 MG tablet Take 1 tablet (500 mg total) by mouth every other day.  15 tablet  0  . benzonatate (TESSALON) 200 MG capsule Take 1 capsule (200 mg total) by mouth 2 (two) times daily as needed for cough.  20 capsule  0  . buPROPion (WELLBUTRIN XL) 150 MG 24 hr tablet Take 1 tablet (150 mg total) by mouth daily.  30 tablet  3   No facility-administered encounter  medications on file as of 02/10/2013.   BP 120/60  Pulse 52  Temp(Src) 97.8 F (36.6 C) (Oral)  Wt 195 lb (88.451 kg)  BMI 29.66 kg/m2  SpO2 97%  Review of Systems  Constitutional: Negative for fever, chills, activity change, appetite change, fatigue and unexpected weight change.  Eyes: Negative for visual disturbance.  Respiratory: Negative for cough and shortness of breath.   Cardiovascular: Negative for chest pain, palpitations and leg swelling.  Gastrointestinal: Negative for abdominal pain and abdominal distention.  Genitourinary: Negative for dysuria, urgency and difficulty urinating.  Musculoskeletal: Negative for arthralgias and gait problem.  Skin: Negative for color change and rash.  Hematological: Negative for adenopathy.  Psychiatric/Behavioral: Positive for behavioral problems, dysphoric mood and agitation. Negative for sleep disturbance. The patient is nervous/anxious.        Objective:   Physical Exam  Constitutional: He is oriented to person, place, and time. He appears well-developed and well-nourished. No distress.  HENT:  Head: Normocephalic and atraumatic.  Right Ear: External ear normal.  Left Ear: External ear normal.  Nose: Nose normal.  Mouth/Throat: Oropharynx is clear and moist.  Eyes: Conjunctivae and EOM are normal. Pupils are equal, round, and reactive to light. Right eye  exhibits no discharge. Left eye exhibits no discharge. No scleral icterus.  Neck: Normal range of motion. Neck supple. No tracheal deviation present. No thyromegaly present.  Cardiovascular: Normal rate, regular rhythm and normal heart sounds.  Exam reveals no gallop and no friction rub.   No murmur heard. Pulmonary/Chest: Effort normal and breath sounds normal. No respiratory distress. He has no wheezes. He has no rales. He exhibits no tenderness.  Musculoskeletal: Normal range of motion. He exhibits no edema.  Lymphadenopathy:    He has no cervical adenopathy.  Neurological: He  is alert and oriented to person, place, and time. No cranial nerve deficit. Coordination normal.  Skin: Skin is warm and dry. No rash noted. He is not diaphoretic. No erythema. No pallor.  Psychiatric: He has a normal mood and affect. His speech is normal and behavior is normal. Judgment and thought content normal. Cognition and memory are normal.          Assessment & Plan:

## 2013-02-10 NOTE — Assessment & Plan Note (Signed)
Will increase Citalopram back to 20mg  daily. Pt will call if any problems with this medication. Follow up in 1-2 months.

## 2013-02-12 ENCOUNTER — Other Ambulatory Visit: Payer: Self-pay | Admitting: *Deleted

## 2013-02-12 DIAGNOSIS — I10 Essential (primary) hypertension: Secondary | ICD-10-CM

## 2013-02-12 MED ORDER — AMLODIPINE BESYLATE 5 MG PO TABS: 5.0000 mg | ORAL_TABLET | Freq: Two times a day (BID) | ORAL | Status: DC

## 2013-02-19 ENCOUNTER — Other Ambulatory Visit: Payer: Self-pay | Admitting: *Deleted

## 2013-02-19 DIAGNOSIS — L301 Dyshidrosis [pompholyx]: Secondary | ICD-10-CM

## 2013-02-19 MED ORDER — CLOBETASOL PROP EMOLLIENT BASE 0.05 % EX CREA: 1.0000 "application " | TOPICAL_CREAM | Freq: Two times a day (BID) | CUTANEOUS | Status: DC

## 2013-02-19 NOTE — Telephone Encounter (Signed)
Eprescribed.

## 2013-05-05 ENCOUNTER — Ambulatory Visit (INDEPENDENT_AMBULATORY_CARE_PROVIDER_SITE_OTHER): Payer: Medicare Other | Admitting: Internal Medicine

## 2013-05-05 ENCOUNTER — Encounter: Payer: Self-pay | Admitting: Internal Medicine

## 2013-05-05 VITALS — BP 144/60 | HR 58 | Temp 97.7°F | Ht 67.25 in | Wt 197.0 lb

## 2013-05-05 DIAGNOSIS — R1314 Dysphagia, pharyngoesophageal phase: Secondary | ICD-10-CM

## 2013-05-05 DIAGNOSIS — Z0000 Encounter for general adult medical examination without abnormal findings: Secondary | ICD-10-CM

## 2013-05-05 DIAGNOSIS — I10 Essential (primary) hypertension: Secondary | ICD-10-CM

## 2013-05-05 DIAGNOSIS — E785 Hyperlipidemia, unspecified: Secondary | ICD-10-CM

## 2013-05-05 DIAGNOSIS — N390 Urinary tract infection, site not specified: Secondary | ICD-10-CM

## 2013-05-05 DIAGNOSIS — F4310 Post-traumatic stress disorder, unspecified: Secondary | ICD-10-CM

## 2013-05-05 LAB — COMPREHENSIVE METABOLIC PANEL
Albumin: 4.2 g/dL (ref 3.5–5.2)
BUN: 18 mg/dL (ref 6–23)
CO2: 23 mEq/L (ref 19–32)
Calcium: 9.8 mg/dL (ref 8.4–10.5)
Chloride: 103 mEq/L (ref 96–112)
GFR: 56.18 mL/min — ABNORMAL LOW (ref >60.00)
Glucose, Bld: 102 mg/dL — ABNORMAL HIGH (ref 70–99)
Potassium: 4.7 mEq/L (ref 3.5–5.1)
Sodium: 136 mEq/L (ref 135–145)
Total Protein: 6.9 g/dL (ref 6.0–8.3)

## 2013-05-05 LAB — POCT URINALYSIS DIPSTICK
Bilirubin, UA: NEGATIVE
Blood, UA: NEGATIVE
Ketones, UA: NEGATIVE
Nitrite, UA: NEGATIVE
Spec Grav, UA: <=1.005
pH, UA: 6

## 2013-05-05 LAB — LDL CHOLESTEROL, DIRECT: Direct LDL: 47.4 mg/dL

## 2013-05-05 LAB — MICROALBUMIN / CREATININE URINE RATIO: Microalb Creat Ratio: 1 mg/g (ref 0.0–30.0)

## 2013-05-05 LAB — LIPID PANEL
Cholesterol: 187 mg/dL (ref 0–200)
Triglycerides: 204 mg/dL — ABNORMAL HIGH (ref 0.0–149.0)

## 2013-05-05 MED ORDER — ATORVASTATIN CALCIUM 20 MG PO TABS: 20.0000 mg | ORAL_TABLET | Freq: Every day | ORAL | Status: DC

## 2013-05-05 NOTE — Assessment & Plan Note (Signed)
BP Readings from Last 3 Encounters:  05/05/13 144/60  02/10/13 120/60  11/30/12 112/60   Blood pressure slightly elevated today but generally has been well-controlled in the past and at home. We'll continue amlodipine. Patient will call if blood pressure consistently greater than 140/90.

## 2013-05-05 NOTE — Assessment & Plan Note (Signed)
Symptoms of PTSD well-controlled with Celexa. We'll continue.

## 2013-05-05 NOTE — Progress Notes (Signed)
Subjective:    Patient ID: Donald [name], male    DOB: 1933-08-03, 79 y.o.   MRN: [id number]  HPI The patient is here for annual Medicare wellness examination and management of other chronic and acute problems.   The risk factors are reflected in the social history.  The roster of all physicians providing medical care to patient - is listed in the Snapshot section of the chart.  Activities of daily living:  The patient is 100% independent in all ADLs: dressing, toileting, feeding as well as independent mobility  Home safety : The patient has smoke detectors in the home. They wear seatbelts.  There are no firearms at home. There is no violence in the home.   There is no risks for hepatitis, STDs or HIV. There is no history of blood transfusion. They have no travel history to infectious disease endemic areas of the world.  The patient has not seen their dentist in the last six month. Has dentures in place. They have [facility] their [facility] Has hearing aids, but does not wear them.  They have deferred audiologic testing in the last year.   They do not  have excessive sun exposure. Discussed the need for sun protection: hats, long sleeves and use of sunscreen if there is significant sun exposure.   Diet: the importance of a healthy diet is discussed. They do have a healthy diet.  The benefits of regular aerobic exercise were discussed. Walks 2 miles every morning.  Depression screen: there are no signs or vegative symptoms of depression- irritability, change in appetite, anhedonia, sadness/tearfullness.  Cognitive assessment: the patient manages all their financial and personal affairs and is actively engaged. They could relate day,date,year and events.  The following portions of the patient's history were reviewed and updated as appropriate: allergies, current medications, past family history, past medical history,  past surgical history, past social  history  and problem list.  Visual acuity was not assessed per patient preference since he has regular follow up with his ophthalmologist. Hearing and body mass index were assessed and reviewed.   During the course of the visit the patient was educated and counseled about appropriate screening and preventive services including : fall prevention , diabetes screening, nutrition counseling, colorectal cancer screening, and recommended immunizations.    Patient is concerned today about several week history of difficulty swallowing. He reports that he gets choked frequently on both liquids and small solid foods such as peanuts. This occurs several times per day. He has never had an upper endoscopy. He has no history of esophageal narrowing. He denies pain with swallowing. He denies weight loss. He denies nausea, change in appetite, or change in bowel habits.  Outpatient Encounter Prescriptions as of 05/05/2013  Medication Sig Dispense Refill  . amLODipine (NORVASC) 5 MG tablet Take 1 tablet (5 mg total) by mouth 2 (two) times daily.  180 tablet  1  . atorvastatin (LIPITOR) 20 MG tablet Take 1 tablet (20 mg total) by mouth daily.  90 tablet  4  . citalopram (CELEXA) 20 MG tablet Take 1 tablet (20 mg total) by mouth daily.  90 tablet  3  . clobetasol cream (TEMOVATE) 0.05 %       . Clobetasol Prop Emollient Base (CLOBETASOL PROPIONATE E) 0.05 % emollient cream Apply 1 application topically 2 (two) times daily. Use topically as needed.  60 g  1  . dutasteride (AVODART) 0.5 MG capsule Take 0.5 mg  by mouth 2 (two) times a week.      . fluocinonide cream (LIDEX) 0.05 % Apply topically 2 (two) times daily.      . Multiple Vitamins-Minerals (MEGA MULTIVITAMIN FOR MEN) TABS Take 2 capsules by mouth daily.      Marland Kitchen omeprazole (PRILOSEC) 20 MG capsule Take 1 capsule (20 mg total) by mouth daily.  30 capsule  6  . Tamsulosin HCl (FLOMAX) 0.4 MG CAPS Take 0.4 mg by mouth daily.      . valACYclovir (VALTREX) 500 MG  tablet Take 1 tablet (500 mg total) by mouth every other day.  15 tablet  0  . buPROPion (WELLBUTRIN XL) 150 MG 24 hr tablet Take 1 tablet (150 mg total) by mouth daily.  30 tablet  3   No facility-administered encounter medications on file as of 05/05/2013.   BP 144/60  Pulse 58  Temp(Src) 97.7 F (36.5 C) (Oral)  Ht 5' 7.25" (1.708 m)  Wt 197 lb (89.359 kg)  BMI 30.63 kg/m2  SpO2 97%   Review of Systems  Constitutional: Negative for fever, chills, activity change, appetite change, fatigue and unexpected weight change.  HENT: Positive for trouble swallowing. Negative for congestion, sore throat, dental problem, voice change and postnasal drip.   Eyes: Negative for visual disturbance.  Respiratory: Negative for cough and shortness of breath.   Cardiovascular: Negative for chest pain, palpitations and leg swelling.  Gastrointestinal: Negative for abdominal pain and abdominal distention.  Genitourinary: Negative for dysuria, urgency and difficulty urinating.  Musculoskeletal: Negative for arthralgias and gait problem.  Skin: Negative for color change and rash.  Hematological: Negative for adenopathy.  Psychiatric/Behavioral: Negative for sleep disturbance and dysphoric mood. The patient is not nervous/anxious.        Objective:   Physical Exam  Constitutional: He is oriented to person, place, and time. He appears well-developed and well-nourished. No distress.  HENT:  Head: Normocephalic and atraumatic.  Right Ear: External ear normal.  Left Ear: External ear normal.  Nose: Nose normal.  Mouth/Throat: Oropharynx is clear and moist. No oropharyngeal exudate.  Eyes: Conjunctivae and EOM are normal. Pupils are equal, round, and reactive to light. Right eye exhibits no discharge. Left eye exhibits no discharge. No scleral icterus.  Neck: Normal range of motion. Neck supple. No tracheal deviation present. No thyromegaly present.  Cardiovascular: Normal rate, regular rhythm and normal  heart sounds.  Exam reveals no gallop and no friction rub.   No murmur heard. Pulmonary/Chest: Effort normal and breath sounds normal. No respiratory distress. He has no wheezes. He has no rales. He exhibits no tenderness.  Abdominal: Soft. Bowel sounds are normal. He exhibits no distension and no mass. There is no tenderness. There is no rebound and no guarding.  Musculoskeletal: Normal range of motion. He exhibits no edema.  Lymphadenopathy:    He has no cervical adenopathy.  Neurological: He is alert and oriented to person, place, and time. No cranial nerve deficit. Coordination normal.  Skin: Skin is warm and dry. No rash noted. He is not diaphoretic. No erythema. No pallor.  Psychiatric: He has a normal mood and affect. His behavior is normal. Judgment and thought content normal.          Assessment & Plan:  .

## 2013-05-05 NOTE — Assessment & Plan Note (Signed)
Will check lipids and LFTs today. Continue atorvastatin. 

## 2013-05-05 NOTE — Assessment & Plan Note (Addendum)
General medical exam normal today. Health maintenance is UTD. Note that prostate screening is performed by urologist. Appropriate screening performed. Will check labs today including CMP, CBC, lipids.  Encouraged healthy diet and regular physical activity.

## 2013-05-05 NOTE — Assessment & Plan Note (Signed)
Symptoms concerned for esophageal narrowing. Will get barium swallow for initial evaluation. Continue PPI. Discussed potential referral for upper endoscopy if symptoms persist. Follow up 4 weeks.

## 2013-05-07 ENCOUNTER — Encounter: Payer: Self-pay | Admitting: *Deleted

## 2013-05-07 LAB — URINE CULTURE: Organism ID, Bacteria: NO GROWTH

## 2013-05-10 ENCOUNTER — Ambulatory Visit
Admission: RE | Admit: 2013-05-10 | Discharge: 2013-05-10 | Disposition: A | Discharge status: Home / Self Care | Payer: Medicare Other | Source: Ambulatory Visit | Attending: Internal Medicine | Admitting: Internal Medicine

## 2013-05-10 DIAGNOSIS — Z0000 Encounter for general adult medical examination without abnormal findings: Secondary | ICD-10-CM

## 2013-05-10 DIAGNOSIS — R1314 Dysphagia, pharyngoesophageal phase: Secondary | ICD-10-CM

## 2013-05-12 ENCOUNTER — Encounter: Payer: Self-pay | Admitting: *Deleted

## 2013-07-07 ENCOUNTER — Telehealth: Payer: Self-pay | Admitting: *Deleted

## 2013-07-07 MED ORDER — FLUTICASONE PROPIONATE 50 MCG/ACT NA SUSP: 2.0000 | Freq: Every day | NASAL | Status: DC

## 2013-07-07 NOTE — Telephone Encounter (Signed)
Patient wife brought in a refill request for Flonase, not on med list and was originally prescribed by another physician. Ok to send in refill?

## 2013-07-07 NOTE — Telephone Encounter (Signed)
Rx sent to pharmacy per patient wife request.

## 2013-07-07 NOTE — Telephone Encounter (Signed)
Fine to send in refill x 6 months.

## 2013-08-06 ENCOUNTER — Encounter: Payer: Self-pay | Admitting: Internal Medicine

## 2013-08-06 ENCOUNTER — Ambulatory Visit (INDEPENDENT_AMBULATORY_CARE_PROVIDER_SITE_OTHER): Payer: Medicare Other | Admitting: Internal Medicine

## 2013-08-06 VITALS — BP 148/58 | HR 57 | Temp 98.3°F | Wt 206.0 lb

## 2013-08-06 DIAGNOSIS — R0600 Dyspnea, unspecified: Secondary | ICD-10-CM

## 2013-08-06 DIAGNOSIS — R0609 Other forms of dyspnea: Secondary | ICD-10-CM

## 2013-08-06 DIAGNOSIS — R0989 Other specified symptoms and signs involving the circulatory and respiratory systems: Secondary | ICD-10-CM

## 2013-08-06 DIAGNOSIS — R0602 Shortness of breath: Secondary | ICD-10-CM | POA: Insufficient documentation

## 2013-08-06 NOTE — Progress Notes (Signed)
Subjective:    Patient ID: Donald [name], male    DOB: [birth year], 79 y.o.   MRN: [id number]  HPI 79YO male with h/o hypertension, hyperlipidemia and former tobacco use presents for acute visit. He reports that over last several months, when he walks his 2 miles in the morning, it takes him nearly an hour, rather than his usual because he is short of breath and has to take frequent stops. He also notes shortness of breath when walking across his home. He denies chest pain, palpitations, diaphoresis. He had PFTs in distant past which reportedly showed "COPD." However, he has never been treated for this. He also had a stress test many years ago which he reports was normal.  Outpatient Encounter Prescriptions as of 08/06/2013  Medication Sig Dispense Refill  . amLODipine (NORVASC) 5 MG tablet Take 1 tablet (5 mg total) by mouth 2 (two) times daily.  180 tablet  1  . atorvastatin (LIPITOR) 20 MG tablet Take 1 tablet (20 mg total) by mouth daily.  90 tablet  4  . buPROPion (WELLBUTRIN XL) 150 MG 24 hr tablet Take 1 tablet (150 mg total) by mouth daily.  30 tablet  3  . citalopram (CELEXA) 20 MG tablet Take 1 tablet (20 mg total) by mouth daily.  90 tablet  3  . clobetasol cream (TEMOVATE) 0.05 %       . Clobetasol Prop Emollient Base (CLOBETASOL PROPIONATE E) 0.05 % emollient cream Apply 1 application topically 2 (two) times daily. Use topically as needed.  60 g  1  . dutasteride (AVODART) 0.5 MG capsule Take 0.5 mg by mouth 2 (two) times a week.      . Multiple Vitamins-Minerals (MEGA MULTIVITAMIN FOR MEN) TABS Take 2 capsules by mouth daily.      Marland Kitchen omeprazole (PRILOSEC) 20 MG capsule Take 1 capsule (20 mg total) by mouth daily.  30 capsule  6  . Tamsulosin HCl (FLOMAX) 0.4 MG CAPS Take 0.4 mg by mouth daily.      . valACYclovir (VALTREX) 500 MG tablet Take 1 tablet (500 mg total) by mouth every other day.  15 tablet  0  . fluocinonide cream (LIDEX) 0.05 % Apply topically 2 (two) times  daily.      . fluticasone (FLONASE) 50 MCG/ACT nasal spray Place 2 sprays into the nose daily.  16 g  6   No facility-administered encounter medications on file as of 08/06/2013.   BP 148/58  Pulse 57  Temp(Src) 98.3 F (36.8 C) (Oral)  Wt 206 lb (93.441 kg)  BMI 32.03 kg/m2  SpO2 96%  Review of Systems  Constitutional: Negative for fever, chills, activity change, appetite change, fatigue and unexpected weight change.  Eyes: Negative for visual disturbance.  Respiratory: Positive for shortness of breath. Negative for cough.   Cardiovascular: Negative for chest pain, palpitations and leg swelling.  Gastrointestinal: Negative for abdominal pain and abdominal distention.  Genitourinary: Negative for dysuria, urgency and difficulty urinating.  Musculoskeletal: Negative for arthralgias and gait problem.  Skin: Negative for color change and rash.  Hematological: Negative for adenopathy.  Psychiatric/Behavioral: Negative for sleep disturbance and dysphoric mood. The patient is not nervous/anxious.        Objective:   Physical Exam  Constitutional: He is oriented to person, place, and time. He appears well-developed and well-nourished. No distress.  HENT:  Head: Normocephalic and atraumatic.  Right Ear: External ear normal.  Left Ear: External ear normal.  Nose: Nose normal.  Mouth/Throat:  Oropharynx is clear and moist. No oropharyngeal exudate.  Eyes: Conjunctivae and EOM are normal. Pupils are equal, round, and reactive to light. Right eye exhibits no discharge. Left eye exhibits no discharge. No scleral icterus.  Neck: Normal range of motion. Neck supple. No tracheal deviation present. No thyromegaly present.  Cardiovascular: Normal rate, regular rhythm and normal heart sounds.  Exam reveals no gallop and no friction rub.   No murmur heard. Pulmonary/Chest: Effort normal and breath sounds normal. No respiratory distress. He has no wheezes. He has no rales. He exhibits no tenderness.   Musculoskeletal: Normal range of motion. He exhibits no edema.  Lymphadenopathy:    He has no cervical adenopathy.  Neurological: He is alert and oriented to person, place, and time. No cranial nerve deficit. Coordination normal.  Skin: Skin is warm and dry. No rash noted. He is not diaphoretic. No erythema. No pallor.  Psychiatric: He has a normal mood and affect. His behavior is normal. Judgment and thought content normal.          Assessment & Plan:

## 2013-08-06 NOTE — Assessment & Plan Note (Signed)
New onset dyspnea on exertion. Exam normal today. Previously diagnosed with COPD, however has never been treated for this. Quit smoking approx 2000. Recent CXR 04/2013 was normal. Discussed potentially trying medications such as Advair to see if any improvement, but pt prefers to proceed with testing first. Will set up pulmonary evaluation with PFTs.  We also discussed possibility of CAD with anginal equivalent as cause of dyspnea. Will set up cardiology evaluation with stress test. Follow up 4 weeks and prn.

## 2013-08-11 ENCOUNTER — Ambulatory Visit (INDEPENDENT_AMBULATORY_CARE_PROVIDER_SITE_OTHER): Payer: Medicare Other | Admitting: Cardiovascular Disease

## 2013-08-11 ENCOUNTER — Encounter: Payer: Self-pay | Admitting: Cardiovascular Disease

## 2013-08-11 VITALS — BP 100/69 | HR 59 | Ht 68.0 in | Wt 204.5 lb

## 2013-08-11 DIAGNOSIS — R079 Chest pain, unspecified: Secondary | ICD-10-CM

## 2013-08-11 DIAGNOSIS — E785 Hyperlipidemia, unspecified: Secondary | ICD-10-CM

## 2013-08-11 DIAGNOSIS — R0609 Other forms of dyspnea: Secondary | ICD-10-CM

## 2013-08-11 DIAGNOSIS — Z87891 Personal history of nicotine dependence: Secondary | ICD-10-CM | POA: Insufficient documentation

## 2013-08-11 DIAGNOSIS — R0602 Shortness of breath: Secondary | ICD-10-CM

## 2013-08-11 DIAGNOSIS — I10 Essential (primary) hypertension: Secondary | ICD-10-CM

## 2013-08-11 DIAGNOSIS — R0600 Dyspnea, unspecified: Secondary | ICD-10-CM

## 2013-08-11 DIAGNOSIS — R0989 Other specified symptoms and signs involving the circulatory and respiratory systems: Secondary | ICD-10-CM

## 2013-08-11 MED ORDER — ALBUTEROL SULFATE HFA 108 (90 BASE) MCG/ACT IN AERS: 2.0000 | INHALATION_SPRAY | Freq: Four times a day (QID) | RESPIRATORY_TRACT | Status: DC | PRN

## 2013-08-11 NOTE — Assessment & Plan Note (Signed)
50 year smoking history. Likely has some component of COPD which could be contributing to his symptoms. Currently on no inhalers. He is scheduled to see Dr. Kendrick Fries

## 2013-08-11 NOTE — Assessment & Plan Note (Signed)
Etiology of his shortness of breath, mild chest tightness with exertion it is uncertain. Concerning for ischemia given his long smoking history. By symptoms, uncertain if he would be able to treadmill. We will order a Myoview. Subtle EKG changes also noted. If stress test is normal, would need pulmonary workup.

## 2013-08-11 NOTE — Assessment & Plan Note (Signed)
We have encouraged him to stay on his Lipitor 

## 2013-08-11 NOTE — Progress Notes (Signed)
Patient ID: Donald [name], male    DOB: 06/02/1933, 79 y.o.   MRN: [id number]  HPI Comments: 79 YO male with h/o hypertension, hyperlipidemia, 50 years of smoking who stopped 14 years ago, who presents by referral from Dr. walker with symptoms of shortness of breath.  He reports that typically he is very active. He walks 2 miles per day every day. In the past 2 or 3 months, he reports having increasing shortness of breath with his walking as well as with other activities. His usual to mile walk  takes him nearly an hour, rather than his usual because he is short of breath and has to take frequent stops. 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 This is a change for him. Now he has shortness of breath, has to stop frequently. With his shortness of breath, he will pant, some chest tightness. He's not on any inhalers. Denies any wheezing with exertion, just unable to catch his breath. Denies any weight change, lower extremity edema, abdominal swelling. No PND or orthopnea.  Reports having PFTs in the past which showed "COPD" per the notes. Prior stress test many years ago. Is uncertain if he would be able to complete a treadmill stress test but would like to try. He reports that his blood pressure at home is typically well controlled, 130/60 on routine basis with heart rates in the high 50s  EKG shows normal sinus rhythm with rate 59 beats per minute, right bundle branch block, T-wave abnormality anterior precordial leads   Outpatient Encounter Prescriptions as of 08/11/2013  Medication Sig Dispense Refill  . amLODipine (NORVASC) 5 MG tablet Take 1 tablet (5 mg total) by mouth 2 (two) times daily.  180 tablet  1  . atorvastatin (LIPITOR) 20 MG tablet Take 1 tablet (20 mg total) by mouth daily.  90 tablet  4  . citalopram (CELEXA) 20 MG tablet Take 1 tablet (20 mg total) by mouth daily.  90 tablet  3  . clobetasol cream (TEMOVATE) 0.05 %       . Clobetasol Prop Emollient Base (CLOBETASOL  PROPIONATE E) 0.05 % emollient cream Apply 1 application topically 2 (two) times daily. Use topically as needed.  60 g  1  . dutasteride (AVODART) 0.5 MG capsule Take 0.5 mg by mouth 2 (two) times a week.      . fluocinonide cream (LIDEX) 0.05 % Apply topically 2 (two) times daily.      . fluticasone (FLONASE) 50 MCG/ACT nasal spray Place 2 sprays into the nose daily.  16 g  6  . Multiple Vitamins-Minerals (MEGA MULTIVITAMIN FOR MEN) TABS Take 2 capsules by mouth daily.      Marland Kitchen omeprazole (PRILOSEC) 20 MG capsule Take 1 capsule (20 mg total) by mouth daily.  30 capsule  6  . Tamsulosin HCl (FLOMAX) 0.4 MG CAPS Take 0.4 mg by mouth daily.      . valACYclovir (VALTREX) 500 MG tablet Take 1 tablet (500 mg total) by mouth every other day.  15 tablet  0  . albuterol (PROVENTIL HFA;VENTOLIN HFA) 108 (90 BASE) MCG/ACT inhaler Inhale 2 puffs into the lungs every 6 (six) hours as needed for wheezing.  1 Inhaler  2  . [DISCONTINUED] buPROPion (WELLBUTRIN XL) 150 MG 24 hr tablet Take 1 tablet (150 mg total) by mouth daily.  30 tablet  3   No facility-administered encounter medications on file as of 08/11/2013.     Review of Systems  Constitutional: Negative.   HENT: Negative.   Eyes: Negative.   Respiratory: Positive for chest tightness and shortness of breath.   Cardiovascular: Negative.   Gastrointestinal: Negative.   Endocrine: Negative.   Musculoskeletal: Negative.   Skin: Negative.   Allergic/Immunologic: Negative.   Neurological: Negative.   Hematological: Negative.   Psychiatric/Behavioral: Negative.   All other systems reviewed and are negative.    BP 100/69  Pulse 59  Ht 5\' 8"  (1.727 m)  Wt 204 lb 8 oz (92.761 kg)  BMI 31.1 kg/m2  Physical Exam  Nursing note and vitals reviewed. Constitutional: He is oriented to person, place, and time. He appears well-developed and well-nourished.  HENT:  Head: Normocephalic.  Nose: Nose normal.  Mouth/Throat: Oropharynx is clear and moist.   Eyes: Conjunctivae are normal. Pupils are equal, round, and reactive to light.  Neck: Normal range of motion. Neck supple. No JVD present.  Cardiovascular: Normal rate, regular rhythm, S1 normal, S2 normal, normal heart sounds and intact distal pulses.  Exam reveals no gallop and no friction rub.   No murmur heard. Pulmonary/Chest: Effort normal and breath sounds normal. No respiratory distress. He has no wheezes. He has no rales. He exhibits no tenderness.  Abdominal: Soft. Bowel sounds are normal. He exhibits no distension. There is no tenderness.  Musculoskeletal: Normal range of motion. He exhibits no edema and no tenderness.  Lymphadenopathy:    He has no cervical adenopathy.  Neurological: He is alert and oriented to person, place, and time. Coordination normal.  Skin: Skin is warm and dry. No rash noted. No erythema.  Psychiatric: He has a normal mood and affect. His behavior is normal. Judgment and thought content normal.      Assessment and Plan

## 2013-08-11 NOTE — Assessment & Plan Note (Signed)
Blood pressure is well controlled on today's visit. No changes made to the medications. 

## 2013-08-11 NOTE — Patient Instructions (Addendum)
You are doing well. No medication changes were made.  We will schedule a stress test at [facility] Hold the amlodipine the night before and morning of the test No caffeine for 24 hours before the test  Please call us if you have new issues that need to be addressed before your next appt.    [facility] MYOVIEW  Your caregiver has ordered a Stress Test with nuclear imaging. The purpose of this test is to evaluate the blood supply to your heart muscle. This procedure is referred to as a "Non-Invasive Stress Test." This is because other than having an IV started in your vein, nothing is inserted or "invades" your body. Cardiac stress tests are done to find areas of poor blood flow to the heart by determining the extent of coronary artery disease (CAD). Some patients exercise on a treadmill, which naturally increases the blood flow to your heart, while others who are  unable to walk on a treadmill due to physical limitations have a pharmacologic/chemical stress agent called Lexiscan . This medicine will mimic walking on a treadmill by temporarily increasing your coronary blood flow.   Please note: these test may take anywhere between 2-4 hours to complete  PLEASE REPORT TO [facility] ENTRANCE  THE VOLUNTEERS AT THE FIRST DESK WILL DIRECT YOU WHERE TO GO  Date of Procedure:_______TUESDAY, OCT 7______________________________  Arrival Time for Procedure:________7:00 am______________________  Instructions regarding medication:    ____:  Hold AMLODIPINE the night before and morning of test.   PLEASE NOTIFY THE OFFICE AT LEAST 24 HOURS IN ADVANCE IF YOU ARE UNABLE TO KEEP YOUR APPOINTMENT.  [phone] AND  PLEASE NOTIFY NUCLEAR MEDICINE AT [facility] AT LEAST 24 HOURS IN ADVANCE IF YOU ARE UNABLE TO KEEP YOUR APPOINTMENT. [phone]  How to prepare for your Myoview test:  1. Do not eat or drink after midnight 2. No caffeine for 24 hours prior to test 3. No smoking 24 hours prior to test. 4. Your  medication may be taken with water.  If your doctor stopped a medication because of this test, do not take that medication. 5. Ladies, please do not wear dresses.  Skirts or pants are appropriate. Please wear a short sleeve shirt. 6. No perfume, cologne or lotion. 7. Wear comfortable walking shoes. No heels!

## 2013-08-17 ENCOUNTER — Ambulatory Visit: Payer: Self-pay | Admitting: Cardiovascular Disease

## 2013-08-17 ENCOUNTER — Other Ambulatory Visit: Payer: Self-pay

## 2013-08-17 DIAGNOSIS — R079 Chest pain, unspecified: Secondary | ICD-10-CM

## 2013-08-17 DIAGNOSIS — R0602 Shortness of breath: Secondary | ICD-10-CM

## 2013-08-20 ENCOUNTER — Telehealth: Payer: Self-pay | Admitting: *Deleted

## 2013-08-20 NOTE — Telephone Encounter (Signed)
Patient wants results of stress test. Thanks

## 2013-08-25 ENCOUNTER — Telehealth: Payer: Self-pay | Admitting: Internal Medicine

## 2013-08-25 ENCOUNTER — Telehealth: Payer: Self-pay

## 2013-08-25 NOTE — Telephone Encounter (Signed)
Normal stress test

## 2013-08-25 NOTE — Telephone Encounter (Signed)
FYI to Dr. Dan Humphreys

## 2013-08-25 NOTE — Telephone Encounter (Signed)
We can add him as an overbook at [facility].

## 2013-08-25 NOTE — Telephone Encounter (Signed)
Pt would like stress test results.  

## 2013-08-25 NOTE — Telephone Encounter (Signed)
Called to make an appt for coughing up white phlegm x2 weeks.  No appt available today.  Donald [name] states if he keeps it up he will just go to the walk-in clinic at [place].

## 2013-08-26 NOTE — Telephone Encounter (Signed)
Left message to call back  

## 2013-08-26 NOTE — Telephone Encounter (Signed)
Spoke w/ pt.  He is aware of results.  

## 2013-08-30 ENCOUNTER — Telehealth: Payer: Self-pay

## 2013-08-30 NOTE — Telephone Encounter (Signed)
Message copied by Marilynne Halsted on Mon Aug 30, 2013  9:58 AM ------      Message from: Antonieta Iba      Created: Sun Aug 29, 2013  6:59 PM       Normal stress myoview on 10/7 ------

## 2013-08-30 NOTE — Telephone Encounter (Signed)
Spoke with patient, he state he never did go to the walk- in clinic. He is feeling better, took him some OTC cough syrup and aspirin.

## 2013-08-31 ENCOUNTER — Encounter: Payer: Self-pay | Admitting: Pulmonary Disease

## 2013-08-31 ENCOUNTER — Ambulatory Visit (INDEPENDENT_AMBULATORY_CARE_PROVIDER_SITE_OTHER): Payer: Medicare Other | Admitting: Pulmonary Disease

## 2013-08-31 VITALS — BP 110/62 | HR 32 | Temp 98.6°F | Ht 67.5 in | Wt 206.0 lb

## 2013-08-31 DIAGNOSIS — R0989 Other specified symptoms and signs involving the circulatory and respiratory systems: Secondary | ICD-10-CM

## 2013-08-31 DIAGNOSIS — R0609 Other forms of dyspnea: Secondary | ICD-10-CM

## 2013-08-31 DIAGNOSIS — I498 Other specified cardiac arrhythmias: Secondary | ICD-10-CM

## 2013-08-31 DIAGNOSIS — R0600 Dyspnea, unspecified: Secondary | ICD-10-CM

## 2013-08-31 DIAGNOSIS — R001 Bradycardia, unspecified: Secondary | ICD-10-CM

## 2013-08-31 NOTE — Patient Instructions (Signed)
Use the spiriva daily We will set up a pulmonary function test for you at [facility] If you develop light headedness, ankle swelling, or worsening shortness of breath call us, Dr. Dan Humphreys, or Dr. Mariah Milling  We will see you back in 4-6 weeks or sooner if needed

## 2013-08-31 NOTE — Assessment & Plan Note (Signed)
He was asymptomatic today and had some bigeminy with a HR of 57.  Discussed with cariology and reviewed old EKG's which showed prior episodes of this.  At this point will watch carefully and have him come back to [name] or cardiology if he develops lightheadedness, chest tightness, or leg swelling.

## 2013-08-31 NOTE — Progress Notes (Signed)
Subjective:    Patient ID: Donald [name], male    DOB: 02/17/1933, 79 y.o.   MRN: [id number]  HPI  This is a very pleasant 79 year old male who comes to our clinic today for evaluation of gradual onset of shortness of breath. He was referred to [name] by Dr. Mariah Milling with cardiology. He states that this is been a gradually progressive process for many years. Several years ago he was told that he had COPD after seeing Dr. Maple Hudson in [place] and having spirometry. He was given albuterol and used it for about 6 months or so but has not used any inhaler since then. He has noticed that when he walks from the kitchen table and to the living room he feels winded. However despite his shortness of breath he manages to walk 2 miles every morning for exercise which takes him about 40 minutes. During this time he does feel short of breath but he manages to press on the right. To his knowledge, he has never been on any sort of long acting bronchodilator or inhaled steroid. Several weeks ago he had a bad cough after experiencing a cold, but this resolved. He does not have a cough on a daily basis. Every once in a while he has food get "hung up in his throat". He had a recent barium swallow which showed a hiatal hernia and normal esophageal motility.  He previously smoked up to 2 packs a day for over 50 years and quit in the year 2000.    Past Medical History  Diagnosis Date  . Hyperlipidemia   . Sebaceous cyst   . Arthritis   . Esophageal reflux   . Hypertension      Family History  Problem Relation Age of Onset  . Heart disease Mother   . Cancer Maternal Aunt     lung     History   Social History  . Marital Status: Married    Spouse Name: N/A    Number of Children: N/A  . Years of Education: N/A   Occupational History  . Not on file.   Social History Main Topics  . Smoking status: Former Smoker -- 1.50 packs/day for 0 years    Types: Cigarettes    Quit date: 09/10/1999  . Smokeless  tobacco: Never Used  . Alcohol Use: No  . Drug Use: No  . Sexual Activity: Not on file   Other Topics Concern  . Not on file   Social History Narrative   Lives in [place] with wife. Dog and cat in home.      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  Recruitment consultant.      Diet - regular      Exercise - Walking     No Known Allergies   Outpatient Prescriptions Prior to Visit  Medication Sig Dispense Refill  . albuterol (PROVENTIL HFA;VENTOLIN HFA) 108 (90 BASE) MCG/ACT inhaler Inhale 2 puffs into the lungs every 6 (six) hours as needed for wheezing.  1 Inhaler  2  . amLODipine (NORVASC) 5 MG tablet Take 1 tablet (5 mg total) by mouth 2 (two) times daily.  180 tablet  1  . atorvastatin (LIPITOR) 20 MG tablet Take 1 tablet (20 mg total) by mouth daily.  90 tablet  4  . citalopram (CELEXA) 20 MG tablet Take 1 tablet (20 mg total) by mouth daily.  90 tablet  3  . clobetasol cream (TEMOVATE) 0.05 %       . Clobetasol Prop  Emollient Base (CLOBETASOL PROPIONATE E) 0.05 % emollient cream Apply 1 application topically 2 (two) times daily. Use topically as needed.  60 g  1  . dutasteride (AVODART) 0.5 MG capsule Take 0.5 mg by mouth 2 (two) times a week.      . fluocinonide cream (LIDEX) 0.05 % Apply topically 2 (two) times daily.      . fluticasone (FLONASE) 50 MCG/ACT nasal spray Place 2 sprays into the nose daily.  16 g  6  . Multiple Vitamins-Minerals (MEGA MULTIVITAMIN FOR MEN) TABS Take 2 capsules by mouth daily.      Marland Kitchen omeprazole (PRILOSEC) 20 MG capsule Take 1 capsule (20 mg total) by mouth daily.  30 capsule  6  . Tamsulosin HCl (FLOMAX) 0.4 MG CAPS Take 0.4 mg by mouth daily.      . valACYclovir (VALTREX) 500 MG tablet Take 1 tablet (500 mg total) by mouth every other day.  15 tablet  0   No facility-administered medications prior to visit.      Review of Systems  Constitutional: Negative for fever, chills, diaphoresis, activity change, appetite change, fatigue and unexpected  weight change.  HENT: Positive for congestion and trouble swallowing. Negative for dental problem, ear discharge, ear pain, facial swelling, hearing loss, mouth sores, nosebleeds, postnasal drip, rhinorrhea, sinus pressure, sneezing, sore throat, tinnitus and voice change.   Eyes: Negative for photophobia, discharge, itching and visual disturbance.  Respiratory: Positive for cough and shortness of breath. Negative for apnea, choking, chest tightness, wheezing and stridor.   Cardiovascular: Negative for chest pain, palpitations and leg swelling.  Gastrointestinal: Negative for nausea, vomiting, abdominal pain, constipation, blood in stool and abdominal distention.  Genitourinary: Negative for dysuria, urgency, frequency, hematuria, flank pain, decreased urine volume and difficulty urinating.  Musculoskeletal: Positive for arthralgias and joint swelling. Negative for back pain, gait problem, myalgias, neck pain and neck stiffness.  Skin: Negative for color change, pallor and rash.  Neurological: Negative for dizziness, tremors, seizures, syncope, speech difficulty, weakness, light-headedness, numbness and headaches.  Hematological: Negative for adenopathy. Does not bruise/bleed easily.  Psychiatric/Behavioral: Negative for confusion, sleep disturbance and agitation. The patient is not nervous/anxious.        Objective:   Physical Exam  Filed Vitals:   08/31/13 0948  BP: 110/62  Pulse: 32  Temp: 98.6 F (37 C)  TempSrc: Oral  Height: 5' 7.5" (1.715 m)  Weight: 206 lb (93.441 kg)  SpO2: 97%   Gen: well appearing, no acute distress HEENT: NCAT, PERRL, EOMi, OP clear, neck supple without masses PULM: CTA B CV: bradycardic, no mgr, no JVD AB: BS+, soft, nontender, no hsm Ext: warm, no edema, no clubbing, no cyanosis Derm: no rash or skin breakdown Neuro: A&Ox4, CN II-XII intact, strength 5/5 in all 4 extremities       Assessment & Plan:   Dyspnea This is most likely due to COPD  given his prior heavy smoking history and prior spirometry results showing COPD.  His recent Chest X-ray did not show other obvious pathology.    Plan: -full PFT -flu shot UTD -Spiriva samples given with albuterol -stay active -f/u 4 weeks  Bradycardia He was asymptomatic today and had some bigeminy with a HR of 57.  Discussed with cariology and reviewed old EKG's which showed prior episodes of this.  At this point will watch carefully and have him come back to [name] or cardiology if he develops lightheadedness, chest tightness, or leg swelling.   Updated Medication List Outpatient  Encounter Prescriptions as of 08/31/2013  Medication Sig Dispense Refill  . albuterol (PROVENTIL HFA;VENTOLIN HFA) 108 (90 BASE) MCG/ACT inhaler Inhale 2 puffs into the lungs every 6 (six) hours as needed for wheezing.  1 Inhaler  2  . amLODipine (NORVASC) 5 MG tablet Take 1 tablet (5 mg total) by mouth 2 (two) times daily.  180 tablet  1  . atorvastatin (LIPITOR) 20 MG tablet Take 1 tablet (20 mg total) by mouth daily.  90 tablet  4  . citalopram (CELEXA) 20 MG tablet Take 1 tablet (20 mg total) by mouth daily.  90 tablet  3  . clobetasol cream (TEMOVATE) 0.05 %       . Clobetasol Prop Emollient Base (CLOBETASOL PROPIONATE E) 0.05 % emollient cream Apply 1 application topically 2 (two) times daily. Use topically as needed.  60 g  1  . dutasteride (AVODART) 0.5 MG capsule Take 0.5 mg by mouth 2 (two) times a week.      . fluocinonide cream (LIDEX) 0.05 % Apply topically 2 (two) times daily.      . fluticasone (FLONASE) 50 MCG/ACT nasal spray Place 2 sprays into the nose daily.  16 g  6  . Multiple Vitamins-Minerals (MEGA MULTIVITAMIN FOR MEN) TABS Take 2 capsules by mouth daily.      Marland Kitchen omeprazole (PRILOSEC) 20 MG capsule Take 1 capsule (20 mg total) by mouth daily.  30 capsule  6  . Tamsulosin HCl (FLOMAX) 0.4 MG CAPS Take 0.4 mg by mouth daily.      . valACYclovir (VALTREX) 500 MG tablet Take 1 tablet (500 mg  total) by mouth every other day.  15 tablet  0   No facility-administered encounter medications on file as of 08/31/2013.

## 2013-08-31 NOTE — Assessment & Plan Note (Addendum)
This is most likely due to COPD given his prior heavy smoking history and prior spirometry results showing COPD.  His recent Chest X-ray did not show other obvious pathology.    Plan: -full PFT -flu shot UTD -Spiriva samples given with albuterol -stay active -f/u 4 weeks

## 2013-09-03 ENCOUNTER — Ambulatory Visit (INDEPENDENT_AMBULATORY_CARE_PROVIDER_SITE_OTHER): Payer: Medicare Other | Admitting: Internal Medicine

## 2013-09-03 ENCOUNTER — Encounter: Payer: Self-pay | Admitting: Internal Medicine

## 2013-09-03 VITALS — BP 122/40 | HR 30 | Temp 98.1°F | Wt 206.0 lb

## 2013-09-03 DIAGNOSIS — R0600 Dyspnea, unspecified: Secondary | ICD-10-CM

## 2013-09-03 DIAGNOSIS — R0989 Other specified symptoms and signs involving the circulatory and respiratory systems: Secondary | ICD-10-CM

## 2013-09-03 DIAGNOSIS — R001 Bradycardia, unspecified: Secondary | ICD-10-CM

## 2013-09-03 DIAGNOSIS — R0609 Other forms of dyspnea: Secondary | ICD-10-CM

## 2013-09-03 DIAGNOSIS — I498 Other specified cardiac arrhythmias: Secondary | ICD-10-CM

## 2013-09-03 MED ORDER — VALACYCLOVIR HCL 500 MG PO TABS: 500.0000 mg | ORAL_TABLET | ORAL | Status: DC

## 2013-09-03 NOTE — Assessment & Plan Note (Signed)
Persistent bradycardia. HR initially measured by nurse in 30s today, 55 on EKG. Will forward EKG from today to his cardiologist. Question if bradycardia might be contributing to dyspnea. Question if Holter monitor might be helpful for further evaluation.

## 2013-09-03 NOTE — Progress Notes (Signed)
Subjective:    Patient ID: Donald [name], male    DOB: October 28, 1933, 79 y.o.   MRN: [id number]  HPI 79 year old male with history of COPD, hypertension, hyperlipidemia, and PTSD presents for followup of recent dyspnea on exertion. He was recently evaluated by pulmonary specialist who felt symptoms were most consistent with COPD. He was started on Spiriva. He reports significant improvement and shortness of breath with walking with use of spiriva. No side effects noted from this medication. He denies any chest pain, palpitations. He reports he is generally feeling well.  Outpatient Prescriptions Prior to Visit  Medication Sig Dispense Refill  . albuterol (PROVENTIL HFA;VENTOLIN HFA) 108 (90 BASE) MCG/ACT inhaler Inhale 2 puffs into the lungs every 6 (six) hours as needed for wheezing.  1 Inhaler  2  . amLODipine (NORVASC) 5 MG tablet Take 1 tablet (5 mg total) by mouth 2 (two) times daily.  180 tablet  1  . atorvastatin (LIPITOR) 20 MG tablet Take 1 tablet (20 mg total) by mouth daily.  90 tablet  4  . citalopram (CELEXA) 20 MG tablet Take 1 tablet (20 mg total) by mouth daily.  90 tablet  3  . clobetasol cream (TEMOVATE) 0.05 %       . Clobetasol Prop Emollient Base (CLOBETASOL PROPIONATE E) 0.05 % emollient cream Apply 1 application topically 2 (two) times daily. Use topically as needed.  60 g  1  . dutasteride (AVODART) 0.5 MG capsule Take 0.5 mg by mouth 2 (two) times a week.      . fluocinonide cream (LIDEX) 0.05 % Apply topically 2 (two) times daily.      . fluticasone (FLONASE) 50 MCG/ACT nasal spray Place 2 sprays into the nose daily.  16 g  6  . Multiple Vitamins-Minerals (MEGA MULTIVITAMIN FOR MEN) TABS Take 2 capsules by mouth daily.      Marland Kitchen omeprazole (PRILOSEC) 20 MG capsule Take 1 capsule (20 mg total) by mouth daily.  30 capsule  6  . Tamsulosin HCl (FLOMAX) 0.4 MG CAPS Take 0.4 mg by mouth daily.      . valACYclovir (VALTREX) 500 MG tablet Take 1 tablet (500 mg total) by mouth  every other day.  15 tablet  0   No facility-administered medications prior to visit.      Review of Systems  Constitutional: Negative for fever, chills, activity change, appetite change, fatigue and unexpected weight change.  Eyes: Negative for visual disturbance.  Respiratory: Negative for cough and shortness of breath.   Cardiovascular: Negative for chest pain, palpitations and leg swelling.  Gastrointestinal: Negative for abdominal pain and abdominal distention.  Genitourinary: Negative for dysuria, urgency and difficulty urinating.  Musculoskeletal: Negative for arthralgias and gait problem.  Skin: Negative for color change and rash.  Hematological: Negative for adenopathy.  Psychiatric/Behavioral: Negative for sleep disturbance and dysphoric mood. The patient is not nervous/anxious.        Objective:   Physical Exam  Constitutional: He is oriented to person, place, and time. He appears well-developed and well-nourished. No distress.  HENT:  Head: Normocephalic and atraumatic.  Right Ear: External ear normal.  Left Ear: External ear normal.  Nose: Nose normal.  Mouth/Throat: Oropharynx is clear and moist. No oropharyngeal exudate.  Eyes: Conjunctivae and EOM are normal. Pupils are equal, round, and reactive to light. Right eye exhibits no discharge. Left eye exhibits no discharge. No scleral icterus.  Neck: Normal range of motion. Neck supple. No tracheal deviation present. No thyromegaly present.  Cardiovascular: Regular rhythm and normal heart sounds.  Bradycardia present.  Exam reveals no gallop and no friction rub.   No murmur heard. Pulmonary/Chest: Effort normal and breath sounds normal. No respiratory distress. He has no wheezes. He has no rales. He exhibits no tenderness.  Musculoskeletal: Normal range of motion. He exhibits no edema.  Lymphadenopathy:    He has no cervical adenopathy.  Neurological: He is alert and oriented to person, place, and time. No cranial  nerve deficit. Coordination normal.  Skin: Skin is warm and dry. No rash noted. He is not diaphoretic. No erythema. No pallor.  Psychiatric: He has a normal mood and affect. His behavior is normal. Judgment and thought content normal.          Assessment & Plan:

## 2013-09-03 NOTE — Assessment & Plan Note (Signed)
Likely secondary to COPD. Marked improvement with Spiriva. Will continue to monitor.

## 2013-09-07 ENCOUNTER — Telehealth: Payer: Self-pay

## 2013-09-07 NOTE — Telephone Encounter (Signed)
Spoke w/ pt.  He is aware of results.   He states that his heart rate has been consistently running in the 30-40s.  He feels like he has a "teeny iny headache in my temple and a wisp of dizziness, but not enough to make it feel like I'm gonna fall out." States that he just found out that his rate was low and he wants to know if there is an explanation. On visit w/ Dr. Dan Humphreys last week, she mentioned to pt that he wear a monitor.   Informed pt that I would find out if Dr. Mariah Milling would like for him to wear one, and if so, for how long.

## 2013-09-07 NOTE — Telephone Encounter (Signed)
Pt would like stress test results. States also his HR averages in the 40. Please call

## 2013-09-08 ENCOUNTER — Emergency Department: Payer: Self-pay | Admitting: Emergency Medicine

## 2013-09-08 ENCOUNTER — Telehealth: Payer: Self-pay | Admitting: Internal Medicine

## 2013-09-08 ENCOUNTER — Other Ambulatory Visit: Payer: Self-pay

## 2013-09-08 ENCOUNTER — Ambulatory Visit: Payer: Medicare Other | Admitting: Cardiovascular Disease

## 2013-09-08 ENCOUNTER — Ambulatory Visit (INDEPENDENT_AMBULATORY_CARE_PROVIDER_SITE_OTHER): Payer: Medicare Other

## 2013-09-08 ENCOUNTER — Telehealth: Payer: Self-pay

## 2013-09-08 DIAGNOSIS — I498 Other specified cardiac arrhythmias: Secondary | ICD-10-CM

## 2013-09-08 DIAGNOSIS — R001 Bradycardia, unspecified: Secondary | ICD-10-CM

## 2013-09-08 LAB — CBC
HCT: 36.2 % — ABNORMAL LOW (ref 40.0–52.0)
MCH: 30.3 pg (ref 26.0–34.0)
MCV: 88 fL (ref 80–100)
Platelet: 179 10*3/uL (ref 150–440)

## 2013-09-08 LAB — COMPREHENSIVE METABOLIC PANEL
Albumin: 4.1 g/dL (ref 3.4–5.0)
Anion Gap: 3 — ABNORMAL LOW (ref 7–16)
Bilirubin,Total: 0.3 mg/dL (ref 0.2–1.0)
Chloride: 107 mmol/L (ref 98–107)
Co2: 25 mmol/L (ref 21–32)
Creatinine: 1.36 mg/dL — ABNORMAL HIGH (ref 0.60–1.30)
EGFR (Non-African Amer.): 49 — ABNORMAL LOW
Glucose: 96 mg/dL (ref 65–99)
Osmolality: 273 (ref 275–301)
Potassium: 4.2 mmol/L (ref 3.5–5.1)
SGPT (ALT): 22 U/L (ref 12–78)
Sodium: 135 mmol/L — ABNORMAL LOW (ref 136–145)
Total Protein: 7.3 g/dL (ref 6.4–8.2)

## 2013-09-08 LAB — CK TOTAL AND CKMB (NOT AT ARMC): CK-MB: 2.3 ng/mL (ref 0.5–3.6)

## 2013-09-08 NOTE — Telephone Encounter (Signed)
Amber - Can you set up referral to Dr. Gwen Pounds?

## 2013-09-08 NOTE — Telephone Encounter (Signed)
Please read below and advise.

## 2013-09-08 NOTE — Telephone Encounter (Signed)
He presented to the emergency room Has been set up with a 48-hour Holter

## 2013-09-08 NOTE — Telephone Encounter (Signed)
The patient wants to be referred to [facility] . He is not happy with [facility]

## 2013-09-08 NOTE — Telephone Encounter (Signed)
Pt called back, stating that he wanted to cancel his 2:30 appt this afternoon. When I asked why, he stated "I'm going somewhere else" and hung up.

## 2013-09-08 NOTE — Telephone Encounter (Signed)
LVM for patient to call our office  

## 2013-09-08 NOTE — Telephone Encounter (Signed)
Pt to see Dr. Gwen Pounds 09/14/13 @ 1045 am

## 2013-09-08 NOTE — Telephone Encounter (Signed)
Pt called this am stating the he is very anxious about finding out what is making his heart rate so slow.  Pt upset that the has not received a reply from his call yesterday. States that several doctors have seen him, but "nobody can figure it out". Reports that his wife went to a class today at the hospital and "it scared her to death.  She wants me to go to the emergency room, but that will cost too much." Offered pt appt this afternoon with Dr. Mariah Milling, but pt hung up on me.  Pt called back inquiring if I made the appt for him.   Pt sched to see Dr. Mariah Milling today at 2:30.

## 2013-09-09 ENCOUNTER — Ambulatory Visit: Payer: Self-pay | Admitting: Pulmonary Disease

## 2013-09-09 LAB — PULMONARY FUNCTION TEST

## 2013-09-13 ENCOUNTER — Other Ambulatory Visit: Payer: Self-pay | Admitting: Internal Medicine

## 2013-09-13 NOTE — Telephone Encounter (Signed)
Patient called checking on status of this prescription. He says this is there 2nd request.

## 2013-09-15 ENCOUNTER — Encounter: Payer: Self-pay | Admitting: Pulmonary Disease

## 2013-09-15 ENCOUNTER — Telehealth: Payer: Self-pay

## 2013-09-15 ENCOUNTER — Telehealth: Payer: Self-pay | Admitting: *Deleted

## 2013-09-15 NOTE — Telephone Encounter (Signed)
Patient called wanting the 24 hr monitor results. Thanks

## 2013-09-15 NOTE — Telephone Encounter (Signed)
Called Pt on mobile phone to discuss PFT results.  He is aware and will continue on Spiriva as directed.   Donald [name]

## 2013-09-15 NOTE — Telephone Encounter (Signed)
Message copied by Velvet Bathe on Wed Sep 15, 2013 10:20 AM ------      Message from: Max Fickle B      Created: Wed Sep 15, 2013 10:14 AM       L,            Please let him know that his PFTs showed COPD and that he needs to continue taking the Spiriva we gave him.            Thanks,      B ------

## 2013-09-15 NOTE — Telephone Encounter (Signed)
Will call pt w results

## 2013-09-16 ENCOUNTER — Telehealth: Payer: Self-pay | Admitting: Pulmonary Disease

## 2013-09-16 MED ORDER — TIOTROPIUM BROMIDE MONOHYDRATE 18 MCG IN CAPS: 18.0000 ug | ORAL_CAPSULE | Freq: Every day | RESPIRATORY_TRACT | Status: DC

## 2013-09-16 NOTE — Telephone Encounter (Signed)
Pt aware RX will be sent. Nothing further needed

## 2013-09-17 ENCOUNTER — Ambulatory Visit (INDEPENDENT_AMBULATORY_CARE_PROVIDER_SITE_OTHER): Payer: Medicare Other | Admitting: Cardiovascular Disease

## 2013-09-17 ENCOUNTER — Ambulatory Visit (INDEPENDENT_AMBULATORY_CARE_PROVIDER_SITE_OTHER): Payer: Medicare Other | Admitting: *Deleted

## 2013-09-17 ENCOUNTER — Telehealth: Payer: Self-pay

## 2013-09-17 ENCOUNTER — Encounter: Payer: Self-pay | Admitting: Cardiovascular Disease

## 2013-09-17 VITALS — BP 110/60

## 2013-09-17 VITALS — BP 128/64 | HR 53 | Ht 68.0 in | Wt 210.0 lb

## 2013-09-17 DIAGNOSIS — R001 Bradycardia, unspecified: Secondary | ICD-10-CM

## 2013-09-17 DIAGNOSIS — R0600 Dyspnea, unspecified: Secondary | ICD-10-CM

## 2013-09-17 DIAGNOSIS — I10 Essential (primary) hypertension: Secondary | ICD-10-CM

## 2013-09-17 DIAGNOSIS — R0989 Other specified symptoms and signs involving the circulatory and respiratory systems: Secondary | ICD-10-CM

## 2013-09-17 DIAGNOSIS — I498 Other specified cardiac arrhythmias: Secondary | ICD-10-CM

## 2013-09-17 DIAGNOSIS — R0609 Other forms of dyspnea: Secondary | ICD-10-CM

## 2013-09-17 DIAGNOSIS — E785 Hyperlipidemia, unspecified: Secondary | ICD-10-CM

## 2013-09-17 NOTE — Telephone Encounter (Signed)
Spoke w/ pt's wife.  She states that pt's heart rate is 36.   Reports he is "feeling tired", but otherwise feels fine. Pt very upset that he has not gotten holter results.  I can hear pt yelling in the background and he refuses to speak with me on the phone. Pt agrees to come in this afternoon to see Dr. Mariah Milling, but states "I ain't coming if you don't have those results.  Call somebody right now and get them."

## 2013-09-17 NOTE — Progress Notes (Signed)
Patient ID: Donald [name], male    DOB: [birth year], 79 y.o.   MRN: [id number]  HPI Comments: 79 Yo male with h/o hypertension, hyperlipidemia, 50 years of smoking who stopped 14 years ago, previously presenting for shortness of breath, presents for followup for bradycardia.  He reports that typically he is very active. He walks 2 miles per day every day. n in general he reports today that he is doing well with no complaints. He does report that his wife was recently at a conference about cardiac issues and pacemakers. He knew that his heart rate ran low at times and she encouraged him to go to the emergency room for evaluation. Our office was called from the emergency room and a detailed that his heart rate was in the 50s, blood pressure was excellent and he was asymptomatic he was discharged to our office for a Holter monitor. He presents today for followup of his Holter monitor. Unfortunately the Holter monitor is not available from the [place] office.   Review of the records shows that his heart rate typically runs in the 50s, blood pressure typically 110-120 systolic. There was a report of heart rate of 32 when he saw Dr. Kendrick Fries in pulmonary. At the time he was having bigeminal PVCs. Previous EKGs have shown sporadic PVCs. He is asymptomatic from his ectopy. He reports having heart rates in the 50s for many years with no significant symptoms. He does report having very rare episodes of dizziness if he stands up too quickly.  Reports having PFTs in the past which showed "COPD" per the notes. Prior stress test many years ago.  EKG shows normal sinus rhythm with rate 53 beats per minute, right bundle branch block   Outpatient Encounter Prescriptions as of 09/17/2013  Medication Sig  . albuterol (PROVENTIL HFA;VENTOLIN HFA) 108 (90 BASE) MCG/ACT inhaler Inhale 2 puffs into the lungs every 6 (six) hours as needed for wheezing.  Marland Kitchen amLODipine (NORVASC) 5 MG tablet TAKE ONE TABLET TWICE DAILY   . atorvastatin (LIPITOR) 20 MG tablet Take 1 tablet (20 mg total) by mouth daily.  . citalopram (CELEXA) 20 MG tablet Take 1 tablet (20 mg total) by mouth daily.  . clobetasol cream (TEMOVATE) 0.05 %   . Clobetasol Prop Emollient Base (CLOBETASOL PROPIONATE E) 0.05 % emollient cream Apply 1 application topically 2 (two) times daily. Use topically as needed.  . dutasteride (AVODART) 0.5 MG capsule Take 0.5 mg by mouth 2 (two) times a week.  . fluocinonide cream (LIDEX) 0.05 % Apply topically 2 (two) times daily.  . fluticasone (FLONASE) 50 MCG/ACT nasal spray Place 2 sprays into the nose daily.  . Multiple Vitamins-Minerals (MEGA MULTIVITAMIN FOR MEN) TABS Take 2 capsules by mouth daily.  Marland Kitchen omeprazole (PRILOSEC) 20 MG capsule Take 1 capsule (20 mg total) by mouth daily.  . Tamsulosin HCl (FLOMAX) 0.4 MG CAPS Take 0.4 mg by mouth daily.  Marland Kitchen tiotropium (SPIRIVA) 18 MCG inhalation capsule Place 1 capsule (18 mcg total) into inhaler and inhale daily.  . valACYclovir (VALTREX) 500 MG tablet Take 1 tablet (500 mg total) by mouth every other day.     Review of Systems  Constitutional: Negative.   HENT: Negative.   Eyes: Negative.   Cardiovascular: Negative.   Gastrointestinal: Negative.   Endocrine: Negative.   Musculoskeletal: Negative.   Skin: Negative.   Allergic/Immunologic: Negative.   Neurological: Negative.   Hematological: Negative.   Psychiatric/Behavioral: Negative.   All other systems reviewed and are negative.  BP 128/64  Pulse 53  Ht 5\' 8"  (1.727 m)  Wt 210 lb (95.255 kg)  BMI 31.94 kg/m2  Physical Exam  Nursing note and vitals reviewed. Constitutional: He is oriented to person, place, and time. He appears well-developed and well-nourished.  HENT:  Head: Normocephalic.  Nose: Nose normal.  Mouth/Throat: Oropharynx is clear and moist.  Eyes: Conjunctivae are normal. Pupils are equal, round, and reactive to light.  Neck: Normal range of motion. Neck supple. No JVD  present.  Cardiovascular: Regular rhythm, S1 normal, S2 normal, normal heart sounds and intact distal pulses.  Bradycardia present.  Exam reveals no gallop and no friction rub.   No murmur heard. Pulmonary/Chest: Effort normal and breath sounds normal. No respiratory distress. He has no wheezes. He has no rales. He exhibits no tenderness.  Abdominal: Soft. Bowel sounds are normal. He exhibits no distension. There is no tenderness.  Musculoskeletal: Normal range of motion. He exhibits no edema and no tenderness.  Lymphadenopathy:    He has no cervical adenopathy.  Neurological: He is alert and oriented to person, place, and time. Coordination normal.  Skin: Skin is warm and dry. No rash noted. No erythema.  Psychiatric: He has a normal mood and affect. His behavior is normal. Judgment and thought content normal.      Assessment and Plan

## 2013-09-17 NOTE — Patient Instructions (Signed)
You are doing well. No medication changes were made.  Please call us if you have new issues that need to be addressed before your next appt.  Your physician wants you to follow-up in: 6 months.  You will receive a reminder letter in the mail two months in advance. If you don't receive a letter, please call our office to schedule the follow-up appointment.   

## 2013-09-17 NOTE — Assessment & Plan Note (Signed)
Blood pressure is well controlled on today's visit. No changes made to the medications. 

## 2013-09-17 NOTE — Assessment & Plan Note (Signed)
Currently on inhalers. Followed by Dr. Kendrick Fries

## 2013-09-17 NOTE — Assessment & Plan Note (Signed)
Suggested he stay on his Lipitor

## 2013-09-17 NOTE — Assessment & Plan Note (Signed)
Asymptomatic bradycardia. Typically heart rate runs in the 50s, blood pressure 110 to 120s. Very rare episodes of dizziness if he stands up too quickly. Prior EKGs showing very low heart rates in the setting of bigeminal PVCs. He is asymptomatic from his PVCs. We have suggested to he and his wife that we monitor him for now as he is asymptomatic. We'll try to obtain the Holter monitor that he has recently worn for further evaluation.

## 2013-09-17 NOTE — Telephone Encounter (Signed)
Pt wife called and would like monitor results

## 2013-09-28 ENCOUNTER — Telehealth: Payer: Self-pay

## 2013-09-28 NOTE — Telephone Encounter (Signed)
Spoke w/ pt.  Informed him of holter results.   He is aware and will call the office with any problems or concerns.

## 2013-09-29 ENCOUNTER — Encounter: Payer: Self-pay | Admitting: Pulmonary Disease

## 2013-10-05 ENCOUNTER — Ambulatory Visit: Payer: Medicare Other | Admitting: Pulmonary Disease

## 2013-10-12 ENCOUNTER — Encounter: Payer: Self-pay | Admitting: Pulmonary Disease

## 2013-10-12 ENCOUNTER — Ambulatory Visit (INDEPENDENT_AMBULATORY_CARE_PROVIDER_SITE_OTHER): Payer: Medicare Other | Admitting: Pulmonary Disease

## 2013-10-12 VITALS — BP 116/54 | HR 45 | Temp 97.8°F | Ht 69.0 in | Wt 209.0 lb

## 2013-10-12 DIAGNOSIS — J449 Chronic obstructive pulmonary disease, unspecified: Secondary | ICD-10-CM | POA: Insufficient documentation

## 2013-10-12 DIAGNOSIS — J441 Chronic obstructive pulmonary disease with (acute) exacerbation: Secondary | ICD-10-CM

## 2013-10-12 MED ORDER — DOXYCYCLINE HYCLATE 100 MG PO TABS: 100.0000 mg | ORAL_TABLET | Freq: Two times a day (BID) | ORAL | Status: DC

## 2013-10-12 MED ORDER — TIOTROPIUM BROMIDE MONOHYDRATE 18 MCG IN CAPS: 18.0000 ug | ORAL_CAPSULE | Freq: Every day | RESPIRATORY_TRACT | Status: DC

## 2013-10-12 NOTE — Assessment & Plan Note (Signed)
He is having a flare of COPD right now.  It is mild.  Plan: -doxycycline 100mg  po bid bid with yogurt

## 2013-10-12 NOTE — Progress Notes (Signed)
Subjective:    Patient ID: Donald [name], male    DOB: [birth year], 79 y.o.   MRN: [id number]  Synopsis: Donald [name] smoked heavily for many years and was diagnosed with COPD on his first visit to the [facility] pulmonary office in 09/2013.  He Smoked 1.5 ppd for 55 years, quit 2000 09/09/2013 PFT > ratio 61%, FEV1 2.22 L (89% pred), TLC 5.59 L (95% pred) RV 1.98L (78% pred), DLCO 16.2 (80% pred)  HPI  10/12/2013 ROV > Donald [name] says that the Spiriva is helping with this breathing.  He has been taking it every morning before a 2 mile walk and says that he is not feeling as short of breath now. He is currently having some cough with mucus production.  He is a little more short of breath than usual.    Past Medical History  Diagnosis Date  . Hyperlipidemia   . Sebaceous cyst   . Arthritis   . Esophageal reflux   . Hypertension      Review of Systems  Constitutional: Negative for fever, chills and fatigue.  HENT: Negative for postnasal drip, rhinorrhea and sinus pressure.   Respiratory: Positive for cough and shortness of breath. Negative for wheezing.   Cardiovascular: Negative for chest pain, palpitations and leg swelling.       Objective:   Physical Exam Filed Vitals:   10/12/13 0845  BP: 116/54  Pulse: 45  Temp: 97.8 F (36.6 C)  TempSrc: Oral  Height: 5\' 9"  (1.753 m)  Weight: 209 lb (94.802 kg)  SpO2: 97%  RA  Gen: well appearing HEENT: NCAT, OP clear PULM: CTA B, no wheezing CV: Slow, regular, no mgr AB: BS+, soft, nontender Ext: warm, acyanotic       Assessment & Plan:   COPD (chronic obstructive pulmonary disease) Based on his spirometry results and smoking history he has COPD which is Mild by the old spirometry staging criteria.  Given that he has had two exacerbations this year (both mild), he is a GOLD grade C.  He is benefiting from the Spiriva.  Plan: -Continue spiriva daily -flu shot UTD -stay active    COPD exacerbation He  is having a flare of COPD right now.  It is mild.  Plan: -doxycycline 100mg  po bid bid with yogurt   Updated Medication List Outpatient Encounter Prescriptions as of 10/12/2013  Medication Sig  . albuterol (PROVENTIL HFA;VENTOLIN HFA) 108 (90 BASE) MCG/ACT inhaler Inhale 2 puffs into the lungs every 6 (six) hours as needed for wheezing.  Marland Kitchen amLODipine (NORVASC) 5 MG tablet TAKE ONE TABLET TWICE DAILY  . atorvastatin (LIPITOR) 20 MG tablet Take 1 tablet (20 mg total) by mouth daily.  . citalopram (CELEXA) 20 MG tablet Take 1 tablet (20 mg total) by mouth daily.  . clobetasol cream (TEMOVATE) 0.05 %   . Clobetasol Prop Emollient Base (CLOBETASOL PROPIONATE E) 0.05 % emollient cream Apply 1 application topically 2 (two) times daily. Use topically as needed.  . dutasteride (AVODART) 0.5 MG capsule Take 0.5 mg by mouth 2 (two) times a week.  . fluocinonide cream (LIDEX) 0.05 % Apply topically 2 (two) times daily.  . fluticasone (FLONASE) 50 MCG/ACT nasal spray Place 2 sprays into the nose daily.  . Multiple Vitamins-Minerals (MEGA MULTIVITAMIN FOR MEN) TABS Take 2 capsules by mouth daily.  Marland Kitchen omeprazole (PRILOSEC) 20 MG capsule Take 1 capsule (20 mg total) by mouth daily.  . Tamsulosin HCl (FLOMAX) 0.4 MG CAPS Take 0.4 mg by  mouth daily.  Marland Kitchen tiotropium (SPIRIVA) 18 MCG inhalation capsule Place 1 capsule (18 mcg total) into inhaler and inhale daily.  . valACYclovir (VALTREX) 500 MG tablet Take 1 tablet (500 mg total) by mouth every other day.  . [DISCONTINUED] tiotropium (SPIRIVA) 18 MCG inhalation capsule Place 1 capsule (18 mcg total) into inhaler and inhale daily.  Marland Kitchen doxycycline (VIBRA-TABS) 100 MG tablet Take 1 tablet (100 mg total) by mouth 2 (two) times daily.

## 2013-10-12 NOTE — Assessment & Plan Note (Signed)
Based on his spirometry results and smoking history he has COPD which is Mild by the old spirometry staging criteria.  Given that he has had two exacerbations this year (both mild), he is a GOLD grade C.  He is benefiting from the Spiriva.  Plan: -Continue spiriva daily -flu shot UTD -stay active

## 2013-10-12 NOTE — Patient Instructions (Signed)
Take the doxycycline 100mg  by mouth twice a day for 7 days with yogurt for the bronchitis Take spiriva every day no matter how you feel We will see you back in one year or sooner if needed

## 2013-12-08 ENCOUNTER — Ambulatory Visit: Payer: Medicare Other | Admitting: Internal Medicine

## 2013-12-16 ENCOUNTER — Encounter: Payer: Self-pay | Admitting: Internal Medicine

## 2013-12-16 ENCOUNTER — Ambulatory Visit (INDEPENDENT_AMBULATORY_CARE_PROVIDER_SITE_OTHER): Payer: Medicare HMO | Admitting: Internal Medicine

## 2013-12-16 VITALS — BP 140/60 | HR 59 | Temp 97.7°F | Wt 205.0 lb

## 2013-12-16 DIAGNOSIS — I498 Other specified cardiac arrhythmias: Secondary | ICD-10-CM

## 2013-12-16 DIAGNOSIS — I10 Essential (primary) hypertension: Secondary | ICD-10-CM

## 2013-12-16 DIAGNOSIS — E785 Hyperlipidemia, unspecified: Secondary | ICD-10-CM

## 2013-12-16 DIAGNOSIS — R001 Bradycardia, unspecified: Secondary | ICD-10-CM

## 2013-12-16 LAB — COMPREHENSIVE METABOLIC PANEL WITH GFR
ALT: 19 U/L (ref 0–53)
AST: 23 U/L (ref 0–37)
Albumin: 4.2 g/dL (ref 3.5–5.2)
Alkaline Phosphatase: 57 U/L (ref 39–117)
BUN: 23 mg/dL (ref 6–23)
CO2: 24 meq/L (ref 19–32)
Calcium: 9.4 mg/dL (ref 8.4–10.5)
Chloride: 107 meq/L (ref 96–112)
Creatinine, Ser: 1.1 mg/dL (ref 0.4–1.5)
GFR: 67.21 mL/min
Glucose, Bld: 104 mg/dL — ABNORMAL HIGH (ref 70–99)
Potassium: 4.5 meq/L (ref 3.5–5.1)
Sodium: 137 meq/L (ref 135–145)
Total Bilirubin: 0.5 mg/dL (ref 0.3–1.2)
Total Protein: 6.7 g/dL (ref 6.0–8.3)

## 2013-12-16 LAB — CBC WITH DIFFERENTIAL/PLATELET
Basophils Absolute: 0.1 K/uL (ref 0.0–0.1)
Basophils Relative: 0.7 % (ref 0.0–3.0)
Eosinophils Absolute: 0.6 K/uL (ref 0.0–0.7)
Eosinophils Relative: 6.4 % — ABNORMAL HIGH (ref 0.0–5.0)
HCT: 38.5 % — ABNORMAL LOW (ref 39.0–52.0)
Hemoglobin: 12.4 g/dL — ABNORMAL LOW (ref 13.0–17.0)
Lymphocytes Relative: 40 % (ref 12.0–46.0)
Lymphs Abs: 3.8 K/uL (ref 0.7–4.0)
MCHC: 32.4 g/dL (ref 30.0–36.0)
MCV: 90.1 fl (ref 78.0–100.0)
Monocytes Absolute: 0.8 K/uL (ref 0.1–1.0)
Monocytes Relative: 8.2 % (ref 3.0–12.0)
Neutro Abs: 4.3 K/uL (ref 1.4–7.7)
Neutrophils Relative %: 44.7 % (ref 43.0–77.0)
Platelets: 188 K/uL (ref 150.0–400.0)
RBC: 4.27 Mil/uL (ref 4.22–5.81)
RDW: 13.7 % (ref 11.5–14.6)
WBC: 9.5 K/uL (ref 4.5–10.5)

## 2013-12-16 LAB — TSH: TSH: 1.61 u[IU]/mL (ref 0.35–5.50)

## 2013-12-16 NOTE — Progress Notes (Signed)
Subjective:    Patient ID: Donald [name], male    DOB: 04-22-1933, 80 y.o.   MRN: [id number]  HPI 80YO male with hypertension and hyperlipidemia presents for follow up.  Concerned about heart skipping beats, when he checks pulse. No chest pain, dyspnea. Denies noticeable palpitations except on rare occasion. Denies use of new medications, supplements.  Review of Systems  Constitutional: Negative for fever, chills, activity change, appetite change, fatigue and unexpected weight change.  Eyes: Negative for visual disturbance.  Respiratory: Negative for cough and shortness of breath.   Cardiovascular: Positive for palpitations. Negative for chest pain and leg swelling.  Gastrointestinal: Negative for abdominal pain and abdominal distention.  Genitourinary: Negative for dysuria, urgency and difficulty urinating.  Musculoskeletal: Negative for arthralgias and gait problem.  Skin: Negative for color change and rash.  Hematological: Negative for adenopathy.  Psychiatric/Behavioral: Negative for sleep disturbance and dysphoric mood. The patient is not nervous/anxious.        Objective:    BP 140/60  Pulse 59  Temp(Src) 97.7 F (36.5 C) (Oral)  Wt 205 lb (92.987 kg)  SpO2 97% Physical Exam  Constitutional: He is oriented to person, place, and time. He appears well-developed and well-nourished. No distress.  HENT:  Head: Normocephalic and atraumatic.  Right Ear: External ear normal.  Left Ear: External ear normal.  Nose: Nose normal.  Mouth/Throat: Oropharynx is clear and moist. No oropharyngeal exudate.  Eyes: Conjunctivae and EOM are normal. Pupils are equal, round, and reactive to light. Right eye exhibits no discharge. Left eye exhibits no discharge. No scleral icterus.  Neck: Normal range of motion. Neck supple. No tracheal deviation present. No thyromegaly present.  Cardiovascular: Regular rhythm and normal heart sounds.  Frequent extrasystoles are present. Bradycardia  present.  Exam reveals no gallop and no friction rub.   No murmur heard. Pulmonary/Chest: Effort normal and breath sounds normal. No accessory muscle usage. Not tachypneic. No respiratory distress. He has no decreased breath sounds. He has no wheezes. He has no rhonchi. He has no rales. He exhibits no tenderness.  Musculoskeletal: Normal range of motion. He exhibits no edema.  Lymphadenopathy:    He has no cervical adenopathy.  Neurological: He is alert and oriented to person, place, and time. No cranial nerve deficit. Coordination normal.  Skin: Skin is warm and dry. No rash noted. He is not diaphoretic. No erythema. No pallor.  Psychiatric: He has a normal mood and affect. His behavior is normal. Judgment and thought content normal.          Assessment & Plan:   Problem List Items Addressed This Visit   Bradycardia - Primary     Exam and EKG today consistent with bradycardia with frequent PVCs. Pt is generally asymptomatic. No change in EKG compared with previous. Reviewed recent EKGs and Myoview with pt today. Reviewed recent Holter monitor. If symptoms worsening, then we discussed referral back to his cardiologist. Will also check electrolytes, TSH, CBC with labs today.    Relevant Orders      EKG 12-Lead (Completed)      Comp Met (CMET)      CBC w/Diff      TSH   Hyperlipidemia     Will check LFTs with labs today. Continue Atrovastatin.    Hypertension      BP Readings from Last 3 Encounters:  12/16/13 140/60  10/12/13 116/54  09/17/13 128/64   BP well controlled generally on Amlodipine. Will continue.  Return in about 3 months (around 03/15/2014) for Recheck.

## 2013-12-16 NOTE — Assessment & Plan Note (Signed)
Exam and EKG today consistent with bradycardia with frequent PVCs. Pt is generally asymptomatic. No change in EKG compared with previous. Reviewed recent EKGs and Myoview with pt today. Reviewed recent Holter monitor. If symptoms worsening, then we discussed referral back to his cardiologist. Will also check electrolytes, TSH, CBC with labs today.

## 2013-12-16 NOTE — Progress Notes (Signed)
Pre-visit discussion using our clinic review tool. No additional management support is needed unless otherwise documented below in the visit note.  

## 2013-12-16 NOTE — Assessment & Plan Note (Signed)
BP Readings from Last 3 Encounters:  12/16/13 140/60  10/12/13 116/54  09/17/13 128/64   BP well controlled generally on Amlodipine. Will continue.

## 2013-12-16 NOTE — Assessment & Plan Note (Signed)
Will check LFTs with labs today. Continue Atrovastatin.

## 2013-12-17 ENCOUNTER — Encounter: Payer: Self-pay | Admitting: *Deleted

## 2013-12-17 ENCOUNTER — Telehealth: Payer: Self-pay | Admitting: Internal Medicine

## 2013-12-17 NOTE — Telephone Encounter (Signed)
Relevant patient education mailed to patient.  

## 2014-02-01 LAB — LIPID PANEL
Cholesterol: 204 mg/dL — AB (ref 0–200)
HDL: 54 mg/dL (ref 35–70)
LDL Cholesterol: 122 mg/dL
TRIGLYCERIDES: 174 mg/dL — AB (ref 40–160)

## 2014-02-15 ENCOUNTER — Other Ambulatory Visit: Payer: Self-pay | Admitting: Internal Medicine

## 2014-02-15 ENCOUNTER — Telehealth: Payer: Self-pay | Admitting: Internal Medicine

## 2014-02-22 ENCOUNTER — Ambulatory Visit (INDEPENDENT_AMBULATORY_CARE_PROVIDER_SITE_OTHER): Payer: Commercial Managed Care - HMO | Admitting: Internal Medicine

## 2014-02-22 ENCOUNTER — Encounter: Payer: Self-pay | Admitting: Internal Medicine

## 2014-02-22 VITALS — BP 140/70 | HR 53 | Temp 98.3°F | Wt 206.0 lb

## 2014-02-22 DIAGNOSIS — L259 Unspecified contact dermatitis, unspecified cause: Secondary | ICD-10-CM

## 2014-02-22 DIAGNOSIS — L309 Dermatitis, unspecified: Secondary | ICD-10-CM

## 2014-02-22 NOTE — Progress Notes (Signed)
   Subjective:    Patient ID: Donald [name], male    DOB: April 28, 1933, 80 y.o.   MRN: [id number]  HPI 80YO male presents for acute visit.  Rash - Scaling, itchy rash left thumb for several weeks. Tried topical anti-fungal and steroid cream including hydrocortisone and clobetasol with no improvement.  Review of Systems  Constitutional: Negative for fever and chills.  Skin: Positive for color change and rash.       Objective:    BP 140/70  Pulse 53  Temp(Src) 98.3 F (36.8 C) (Oral)  Wt 206 lb (93.441 kg)  SpO2 95% Physical Exam  Constitutional: He is oriented to person, place, and time. He appears well-developed and well-nourished. No distress.  HENT:  Head: Normocephalic and atraumatic.  Eyes: Conjunctivae and EOM are normal. Pupils are equal, round, and reactive to light.  Neck: Normal range of motion.  Musculoskeletal: Normal range of motion.  Neurological: He is alert and oriented to person, place, and time.  Skin: Skin is warm and dry. Rash noted. He is not diaphoretic. There is erythema.             Assessment & Plan:   Problem List Items Addressed This Visit   Dermatitis - Primary     Rash is most consistent in appearance with psoriasis versus dyshidrotic eczema. However, no improvement over 8 weeks with topical steroids applied by patient. Will set up dermatology referral. Question if biopsy might be helpful for diagnosis.    Relevant Orders      Ambulatory referral to Dermatology       Return if symptoms worsen or fail to improve.

## 2014-02-22 NOTE — Assessment & Plan Note (Signed)
Rash is most consistent in appearance with psoriasis versus dyshidrotic eczema. However, no improvement over 8 weeks with topical steroids applied by patient. Will set up dermatology referral. Question if biopsy might be helpful for diagnosis.

## 2014-02-23 ENCOUNTER — Encounter: Payer: Self-pay | Admitting: Emergency Medicine

## 2014-03-16 ENCOUNTER — Ambulatory Visit (INDEPENDENT_AMBULATORY_CARE_PROVIDER_SITE_OTHER): Payer: Medicare HMO | Admitting: Cardiovascular Disease

## 2014-03-16 ENCOUNTER — Encounter: Payer: Self-pay | Admitting: Cardiovascular Disease

## 2014-03-16 VITALS — BP 120/60 | HR 56 | Ht 68.0 in | Wt 206.5 lb

## 2014-03-16 DIAGNOSIS — I498 Other specified cardiac arrhythmias: Secondary | ICD-10-CM

## 2014-03-16 DIAGNOSIS — E785 Hyperlipidemia, unspecified: Secondary | ICD-10-CM

## 2014-03-16 DIAGNOSIS — R001 Bradycardia, unspecified: Secondary | ICD-10-CM

## 2014-03-16 DIAGNOSIS — J449 Chronic obstructive pulmonary disease, unspecified: Secondary | ICD-10-CM

## 2014-03-16 DIAGNOSIS — I10 Essential (primary) hypertension: Secondary | ICD-10-CM

## 2014-03-16 NOTE — Assessment & Plan Note (Signed)
Blood pressure is well controlled on today's visit. No changes made to the medications. 

## 2014-03-16 NOTE — Assessment & Plan Note (Addendum)
Labs pending. Suggested he continue on his Lipitor

## 2014-03-16 NOTE — Assessment & Plan Note (Signed)
Reports that he uses albuterol periodically, does not use Spiriva

## 2014-03-16 NOTE — Progress Notes (Signed)
Patient ID: Donald [name], male    DOB: 1932/11/25, 80 y.o.   MRN: [id number]  HPI Comments: 80 Yo male with h/o hypertension, hyperlipidemia, 50 years of smoking who stopped 14 years ago, previously presenting for shortness of breath, presents for routine followup. Previously noted bradycardia, he symptomatic  He continues to be very active, walks daily. Trying to get his wife to walk with him. No new complaints.  heart rate typically runs in the 50s, blood pressure typically 628-366 systolic.  There was a report of heart rate of 32 when he saw Dr. Lake Bells in pulmonary. At the time he was having bigeminal PVCs.  Previous EKGs have shown sporadic PVCs. He is asymptomatic from his ectopy. No new changes on today's visit.  Reports having PFTs in the past which showed "COPD" per the notes. Prior stress test many years ago.  EKG shows normal sinus rhythm with rate 56 beats per minute, right bundle branch block, he continues to have PVCs in a bigeminal pattern, asymptomatic   Outpatient Encounter Prescriptions as of 03/16/2014  Medication Sig  . albuterol (PROVENTIL HFA;VENTOLIN HFA) 108 (90 BASE) MCG/ACT inhaler Inhale 2 puffs into the lungs every 6 (six) hours as needed for wheezing.  Marland Kitchen amLODipine (NORVASC) 5 MG tablet TAKE ONE TABLET TWICE DAILY  . atorvastatin (LIPITOR) 20 MG tablet Take 1 tablet (20 mg total) by mouth daily.  . citalopram (CELEXA) 20 MG tablet TAKE ONE (1) TABLET EACH DAY  . clobetasol cream (TEMOVATE) 0.05 %   . Clobetasol Prop Emollient Base (CLOBETASOL PROPIONATE E) 0.05 % emollient cream Apply 1 application topically 2 (two) times daily. Use topically as needed.  . dutasteride (AVODART) 0.5 MG capsule Take 0.5 mg by mouth 2 (two) times a week.  . fluocinonide cream (LIDEX) 0.05 % Apply topically 2 (two) times daily.  . fluticasone (FLONASE) 50 MCG/ACT nasal spray Place 2 sprays into the nose daily.  . Multiple Vitamins-Minerals (MEGA MULTIVITAMIN FOR MEN) TABS Take  2 capsules by mouth daily.  Marland Kitchen omeprazole (PRILOSEC) 20 MG capsule Take 1 capsule (20 mg total) by mouth daily.  . Tamsulosin HCl (FLOMAX) 0.4 MG CAPS Take 0.4 mg by mouth daily.  Marland Kitchen tiotropium (SPIRIVA) 18 MCG inhalation capsule Place 1 capsule (18 mcg total) into inhaler and inhale daily.  . valACYclovir (VALTREX) 500 MG tablet Take 1 tablet (500 mg total) by mouth every other day.     Review of Systems  Constitutional: Negative.   HENT: Negative.   Eyes: Negative.   Respiratory: Negative.   Cardiovascular: Negative.   Gastrointestinal: Negative.   Endocrine: Negative.   Musculoskeletal: Negative.   Skin: Negative.   Allergic/Immunologic: Negative.   Neurological: Negative.   Hematological: Negative.   Psychiatric/Behavioral: Negative.   All other systems reviewed and are negative.   BP 120/60  Pulse 56  Ht 5\' 8"  (1.727 m)  Wt 206 lb 8 oz (93.668 kg)  BMI 31.41 kg/m2  Physical Exam  Nursing note and vitals reviewed. Constitutional: He is oriented to person, place, and time. He appears well-developed and well-nourished.  HENT:  Head: Normocephalic.  Nose: Nose normal.  Mouth/Throat: Oropharynx is clear and moist.  Eyes: Conjunctivae are normal. Pupils are equal, round, and reactive to light.  Neck: Normal range of motion. Neck supple. No JVD present.  Cardiovascular: S1 normal, S2 normal, normal heart sounds and intact distal pulses.  A regularly irregular rhythm present. Bradycardia present.  Exam reveals no gallop and no friction rub.  No murmur heard. Pulmonary/Chest: Effort normal and breath sounds normal. No respiratory distress. He has no wheezes. He has no rales. He exhibits no tenderness.  Abdominal: Soft. Bowel sounds are normal. He exhibits no distension. There is no tenderness.  Musculoskeletal: Normal range of motion. He exhibits no edema and no tenderness.  Lymphadenopathy:    He has no cervical adenopathy.  Neurological: He is alert and oriented to person,  place, and time. Coordination normal.  Skin: Skin is warm and dry. No rash noted. No erythema.  Psychiatric: He has a normal mood and affect. His behavior is normal. Judgment and thought content normal.      Assessment and Plan

## 2014-03-16 NOTE — Patient Instructions (Signed)
You are doing well. No medication changes were made.  Please call us if you have new issues that need to be addressed before your next appt.  Your physician wants you to follow-up in: 6 months.  You will receive a reminder letter in the mail two months in advance. If you don't receive a letter, please call our office to schedule the follow-up appointment.   

## 2014-03-16 NOTE — Assessment & Plan Note (Signed)
Asymptomatic bradycardia. Rate can appear low in the setting of bigeminal PVCs. No pacer at this time as he is asymptomatic.

## 2014-03-18 ENCOUNTER — Ambulatory Visit (INDEPENDENT_AMBULATORY_CARE_PROVIDER_SITE_OTHER): Payer: Medicare HMO | Admitting: Internal Medicine

## 2014-03-18 ENCOUNTER — Encounter: Payer: Self-pay | Admitting: Internal Medicine

## 2014-03-18 VITALS — BP 102/58 | HR 62 | Temp 98.3°F | Wt 205.0 lb

## 2014-03-18 DIAGNOSIS — I10 Essential (primary) hypertension: Secondary | ICD-10-CM

## 2014-03-18 DIAGNOSIS — Z23 Encounter for immunization: Secondary | ICD-10-CM

## 2014-03-18 DIAGNOSIS — E785 Hyperlipidemia, unspecified: Secondary | ICD-10-CM

## 2014-03-18 DIAGNOSIS — L259 Unspecified contact dermatitis, unspecified cause: Secondary | ICD-10-CM

## 2014-03-18 DIAGNOSIS — Z136 Encounter for screening for cardiovascular disorders: Secondary | ICD-10-CM | POA: Insufficient documentation

## 2014-03-18 DIAGNOSIS — L309 Dermatitis, unspecified: Secondary | ICD-10-CM

## 2014-03-18 NOTE — Progress Notes (Signed)
Subjective:    Patient ID: Donald [name], male    DOB: 12/22/1932, 80 y.o.   MRN: [id number]  HPI 80YO male presents for follow up. Feeling well. No concerns today. Brought records on recent lipids from [place]. Cholesterol has been stable. Compliant with meds. Continues to follow healthy diet and walks daily. Recently followed up with cardiology for bradycardia. No further interventions at present, as he is asymptomatic.  Red rash on hands has improved without intervention. Typically improves during warmer months per his report. No change noted with steroid creams.  Review of Systems  Constitutional: Negative for fever, chills, activity change, appetite change, fatigue and unexpected weight change.  Eyes: Negative for visual disturbance.  Respiratory: Negative for cough and shortness of breath.   Cardiovascular: Negative for chest pain, palpitations and leg swelling.  Gastrointestinal: Negative for abdominal pain and abdominal distention.  Genitourinary: Negative for dysuria, urgency and difficulty urinating.  Musculoskeletal: Negative for arthralgias and gait problem.  Skin: Negative for color change and rash.  Hematological: Negative for adenopathy.  Psychiatric/Behavioral: Negative for sleep disturbance and dysphoric mood. The patient is not nervous/anxious.        Objective:    BP 102/58  Pulse 62  Temp(Src) 98.3 F (36.8 C) (Oral)  Wt 205 lb (92.987 kg)  SpO2 96% Physical Exam  Constitutional: He is oriented to person, place, and time. He appears well-developed and well-nourished. No distress.  HENT:  Head: Normocephalic and atraumatic.  Right Ear: External ear normal.  Left Ear: External ear normal.  Nose: Nose normal.  Mouth/Throat: Oropharynx is clear and moist. No oropharyngeal exudate.  Eyes: Conjunctivae and EOM are normal. Pupils are equal, round, and reactive to light. Right eye exhibits no discharge. Left eye exhibits no discharge. No scleral icterus.    Neck: Normal range of motion. Neck supple. No tracheal deviation present. No thyromegaly present.  Cardiovascular: Normal rate, regular rhythm and normal heart sounds.  Exam reveals no gallop and no friction rub.   No murmur heard. Pulmonary/Chest: Effort normal and breath sounds normal. No accessory muscle usage. Not tachypneic. No respiratory distress. He has no decreased breath sounds. He has no wheezes. He has no rhonchi. He has no rales. He exhibits no tenderness.  Musculoskeletal: Normal range of motion. He exhibits no edema.  Lymphadenopathy:    He has no cervical adenopathy.  Neurological: He is alert and oriented to person, place, and time. No cranial nerve deficit. Coordination normal.  Skin: Skin is warm and dry. No rash noted. He is not diaphoretic. No erythema. No pallor.  Psychiatric: He has a normal mood and affect. His behavior is normal. Judgment and thought content normal.          Assessment & Plan:   Problem List Items Addressed This Visit   Dermatitis     Symptoms have improved without intervention. Most consistent with dyshidrotic eczema. Will continue to monitor.    Hyperlipidemia     Cholesterol has been stable. Continue Atorvastatin. Repeat lipids and LFTs in 6 months.    Hypertension - Primary      BP Readings from Last 3 Encounters:  03/18/14 102/58  03/16/14 120/60  02/22/14 140/70   BP well controlled on Amlodipine. Will continue.    Screening for abdominal aortic aneurysm     Current guidelines would recommend screening for AAA with [name] given h/o tobacco use. Will schedule.    Relevant Orders      Ambulatory referral to Vascular Surgery  Other Visit Diagnoses   Need for prophylactic vaccination against Streptococcus pneumoniae (pneumococcus)        Relevant Orders       Pneumococcal conjugate vaccine 13-valent (Completed)        Return in about 3 months (around 06/18/2014) for Wellness Visit.

## 2014-03-18 NOTE — Assessment & Plan Note (Signed)
BP Readings from Last 3 Encounters:  03/18/14 102/58  03/16/14 120/60  02/22/14 140/70   BP well controlled on Amlodipine. Will continue.

## 2014-03-18 NOTE — Progress Notes (Signed)
Pre visit review using our clinic review tool, if applicable. No additional management support is needed unless otherwise documented below in the visit note. 

## 2014-03-18 NOTE — Assessment & Plan Note (Signed)
Current guidelines would recommend screening for AAA with [name] given h/o tobacco use. Will schedule.

## 2014-03-18 NOTE — Assessment & Plan Note (Signed)
Symptoms have improved without intervention. Most consistent with dyshidrotic eczema. Will continue to monitor.

## 2014-03-18 NOTE — Assessment & Plan Note (Signed)
Cholesterol has been stable. Continue Atorvastatin. Repeat lipids and LFTs in 6 months.

## 2014-03-19 ENCOUNTER — Telehealth: Payer: Self-pay | Admitting: Internal Medicine

## 2014-03-19 NOTE — Telephone Encounter (Signed)
Relevant patient education assigned to patient using Emmi. ° °

## 2014-03-24 ENCOUNTER — Telehealth: Payer: Self-pay | Admitting: Internal Medicine

## 2014-03-24 NOTE — Telephone Encounter (Signed)
Received fax from [facility] back Treating Provider Leotis Pain # of Visits 6 Start Date 03/28/2014 End Date 06/26/2014 CPT 99499 DX V81.2

## 2014-04-05 ENCOUNTER — Telehealth: Payer: Self-pay | Admitting: Internal Medicine

## 2014-04-05 NOTE — Telephone Encounter (Signed)
The patient is needing his results from [facility] . He has been requesting them since last Thursday.

## 2014-04-05 NOTE — Telephone Encounter (Signed)
They have not sent any results over yet. We can request them.

## 2014-04-08 ENCOUNTER — Telehealth: Payer: Self-pay | Admitting: Internal Medicine

## 2014-04-08 NOTE — Telephone Encounter (Signed)
Ultrasound of the aorta was normal

## 2014-04-19 ENCOUNTER — Other Ambulatory Visit: Payer: Self-pay | Admitting: Internal Medicine

## 2014-04-19 DIAGNOSIS — L301 Dyshidrosis [pompholyx]: Secondary | ICD-10-CM

## 2014-04-19 MED ORDER — CLOBETASOL PROP EMOLLIENT BASE 0.05 % EX CREA: 1.0000 "application " | TOPICAL_CREAM | Freq: Two times a day (BID) | CUTANEOUS | Status: DC

## 2014-06-20 ENCOUNTER — Encounter: Payer: Self-pay | Admitting: Internal Medicine

## 2014-06-20 ENCOUNTER — Ambulatory Visit (INDEPENDENT_AMBULATORY_CARE_PROVIDER_SITE_OTHER): Payer: Medicare HMO | Admitting: Internal Medicine

## 2014-06-20 VITALS — BP 124/58 | HR 55 | Temp 98.3°F | Ht 68.0 in | Wt 205.8 lb

## 2014-06-20 DIAGNOSIS — E785 Hyperlipidemia, unspecified: Secondary | ICD-10-CM

## 2014-06-20 DIAGNOSIS — E669 Obesity, unspecified: Secondary | ICD-10-CM | POA: Insufficient documentation

## 2014-06-20 DIAGNOSIS — Z0000 Encounter for general adult medical examination without abnormal findings: Secondary | ICD-10-CM

## 2014-06-20 DIAGNOSIS — I10 Essential (primary) hypertension: Secondary | ICD-10-CM

## 2014-06-20 LAB — COMPREHENSIVE METABOLIC PANEL
ALT: 16 U/L (ref 0–53)
AST: 26 U/L (ref 0–37)
Albumin: 4.2 g/dL (ref 3.5–5.2)
Alkaline Phosphatase: 63 U/L (ref 39–117)
BILIRUBIN TOTAL: 0.5 mg/dL (ref 0.2–1.2)
BUN: 22 mg/dL (ref 6–23)
CALCIUM: 9.5 mg/dL (ref 8.4–10.5)
CHLORIDE: 105 meq/L (ref 96–112)
CO2: 26 mEq/L (ref 19–32)
CREATININE: 1.4 mg/dL (ref 0.4–1.5)
GFR: 51.04 mL/min — ABNORMAL LOW (ref >60.00)
Glucose, Bld: 100 mg/dL — ABNORMAL HIGH (ref 70–99)
Potassium: 4.9 mEq/L (ref 3.5–5.1)
Sodium: 136 mEq/L (ref 135–145)
Total Protein: 7.1 g/dL (ref 6.0–8.3)

## 2014-06-20 LAB — LIPID PANEL
Cholesterol: 180 mg/dL (ref 0–200)
HDL: 48 mg/dL (ref >39.00)
NonHDL: 132
Total CHOL/HDL Ratio: 4
Triglycerides: 205 mg/dL — ABNORMAL HIGH (ref 0.0–149.0)
VLDL: 41 mg/dL — ABNORMAL HIGH (ref 0.0–40.0)

## 2014-06-20 LAB — MICROALBUMIN / CREATININE URINE RATIO
Creatinine,U: 99.3 mg/dL
Microalb Creat Ratio: 2.4 mg/g (ref 0.0–30.0)
Microalb, Ur: 2.4 mg/dL — ABNORMAL HIGH (ref 0.0–1.9)

## 2014-06-20 LAB — LDL CHOLESTEROL, DIRECT: LDL DIRECT: 110.3 mg/dL

## 2014-06-20 NOTE — Assessment & Plan Note (Signed)
Wt Readings from Last 3 Encounters:  06/20/14 205 lb 12 oz (93.328 kg)  03/18/14 205 lb (92.987 kg)  03/16/14 206 lb 8 oz (93.668 kg)   Body mass index is 31.29 kg/(m^2).   Encouraged healthy diet and regular physical activity with goal of weight loss.

## 2014-06-20 NOTE — Progress Notes (Signed)
Pre visit review using our clinic review tool, if applicable. No additional management support is needed unless otherwise documented below in the visit note. 

## 2014-06-20 NOTE — Progress Notes (Signed)
Subjective:    Patient ID: Donald [name], male    DOB: 24-Oct-1933, 80 y.o.   MRN: [id number]  HPI The patient is here for annual Medicare wellness examination and management of other chronic and acute problems.   The risk factors are reflected in the social history.  The roster of all physicians providing medical care to patient - is listed in the Snapshot section of the chart.  Activities of daily living:  The patient is 100% independent in all ADLs: dressing, toileting, feeding as well as independent mobility.  Lives in home, 1 story, with wife and two dogs.  Hardwood floors.  Home safety : The patient has smoke detectors in the home. They wear seatbelts.  There are no firearms at home. There is no violence in the home.   There is no risks for hepatitis, STDs or HIV. There is no history of blood transfusion. They have no travel history to infectious disease endemic areas of the world.  The patient has not seen their dentist in the last six month. Has dentures in place. They have [facility] their [facility]. Has hearing aids, but does not wear them.  They have deferred audiologic testing in the last year.   They do not  have excessive sun exposure. Discussed the need for sun protection: hats, long sleeves and use of sunscreen if there is significant sun exposure.  Dermatology - Dr. Nehemiah Massed.  Diet: the importance of a healthy diet is discussed. They do have a healthy diet.  The benefits of regular aerobic exercise were discussed. Walks 2 miles every morning.  Depression screen: there are no signs or vegative symptoms of depression- irritability, change in appetite, anhedonia, sadness/tearfullness.  Cognitive assessment: the patient manages all their financial and personal affairs and is actively engaged. They could relate day,date,year and events.  The following portions of the patient's history were reviewed and updated as appropriate: allergies,  current medications, past family history, past medical history,  past surgical history, past social history  and problem list.  Visual acuity was not assessed per patient preference since he has regular follow up with his ophthalmologist. Hearing and body mass index were assessed and reviewed.   During the course of the visit the patient was educated and counseled about appropriate screening and preventive services including : fall prevention , diabetes screening, nutrition counseling, colorectal cancer screening, and recommended immunizations.     Review of Systems  Constitutional: Negative for fever, chills, activity change, appetite change, fatigue and unexpected weight change.  Eyes: Negative for visual disturbance.  Respiratory: Negative for cough and shortness of breath.   Cardiovascular: Negative for chest pain, palpitations and leg swelling.  Gastrointestinal: Negative for abdominal pain and abdominal distention.  Genitourinary: Negative for dysuria, urgency and difficulty urinating.  Musculoskeletal: Negative for arthralgias and gait problem.  Skin: Negative for color change and rash.  Hematological: Negative for adenopathy.  Psychiatric/Behavioral: Negative for sleep disturbance and dysphoric mood. The patient is not nervous/anxious.        Objective:    BP 124/58  Pulse 55  Temp(Src) 98.3 F (36.8 C) (Oral)  Ht 5\' 8"  (1.727 m)  Wt 205 lb 12 oz (93.328 kg)  BMI 31.29 kg/m2  SpO2 96% Physical Exam  Constitutional: He is oriented to person, place, and time. He appears well-developed and well-nourished. No distress.  HENT:  Head: Normocephalic and atraumatic.  Right Ear: External ear normal.  Left Ear: External  ear normal.  Nose: Nose normal.  Mouth/Throat: Oropharynx is clear and moist. No oropharyngeal exudate.  Eyes: Conjunctivae and EOM are normal. Pupils are equal, round, and reactive to light. Right eye exhibits no discharge. Left eye exhibits no discharge. No  scleral icterus.  Neck: Normal range of motion. Neck supple. No tracheal deviation present. No thyromegaly present.  Cardiovascular: Normal rate, regular rhythm and normal heart sounds.  Exam reveals no gallop and no friction rub.   No murmur heard. Pulmonary/Chest: Effort normal and breath sounds normal. No respiratory distress. He has no wheezes. He has no rales. He exhibits no tenderness.  Abdominal: Soft. Bowel sounds are normal. He exhibits no distension and no mass. There is no tenderness. There is no rebound and no guarding.  Musculoskeletal: Normal range of motion. He exhibits no edema.  Lymphadenopathy:    He has no cervical adenopathy.  Neurological: He is alert and oriented to person, place, and time. No cranial nerve deficit. Coordination normal.  Skin: Skin is warm and dry. No rash noted. He is not diaphoretic. No erythema. No pallor.  Psychiatric: He has a normal mood and affect. His behavior is normal. Judgment and thought content normal.          Assessment & Plan:   Problem List Items Addressed This Visit     Unprioritized   Hyperlipidemia   Relevant Orders      Lipid panel   Hypertension   Relevant Orders      Comprehensive metabolic panel      Microalbumin / creatinine urine ratio   Medicare annual wellness visit, subsequent - Primary     General medical exam normal today. Health maintenance is UTD. Note that prostate screening is performed by urologist. Appropriate screening performed. Will check labs today including CMP, CBC, lipids.  Encouraged healthy diet and regular physical activity. Immunizations are UTD.      Obesity (BMI 30-39.9)      Wt Readings from Last 3 Encounters:  06/20/14 205 lb 12 oz (93.328 kg)  03/18/14 205 lb (92.987 kg)  03/16/14 206 lb 8 oz (93.668 kg)   Body mass index is 31.29 kg/(m^2).   Encouraged healthy diet and regular physical activity with goal of weight loss.        Return in about 6 months (around 12/21/2014) for  Recheck.

## 2014-06-20 NOTE — Assessment & Plan Note (Addendum)
General medical exam normal today. Health maintenance is UTD. Note that prostate screening is performed by urologist. Appropriate screening performed. Will check labs today including CMP, CBC, lipids.  Encouraged healthy diet and regular physical activity. Immunizations are UTD.

## 2014-06-20 NOTE — Patient Instructions (Signed)

## 2014-07-12 ENCOUNTER — Telehealth: Payer: Self-pay

## 2014-07-12 DIAGNOSIS — L500 Allergic urticaria: Secondary | ICD-10-CM

## 2014-07-12 NOTE — Telephone Encounter (Signed)
The patient asked if he could be referred to [facility] for ongoing hives and skin rash.

## 2014-07-20 ENCOUNTER — Ambulatory Visit (INDEPENDENT_AMBULATORY_CARE_PROVIDER_SITE_OTHER): Payer: Medicare HMO | Admitting: *Deleted

## 2014-07-20 DIAGNOSIS — Z23 Encounter for immunization: Secondary | ICD-10-CM

## 2014-11-23 ENCOUNTER — Ambulatory Visit: Payer: Medicare HMO | Admitting: Cardiovascular Disease

## 2014-11-24 ENCOUNTER — Ambulatory Visit (INDEPENDENT_AMBULATORY_CARE_PROVIDER_SITE_OTHER): Payer: Commercial Managed Care - HMO | Admitting: Cardiovascular Disease

## 2014-11-24 ENCOUNTER — Encounter: Payer: Self-pay | Admitting: Cardiovascular Disease

## 2014-11-24 VITALS — BP 130/60 | HR 52 | Ht 68.0 in | Wt 214.0 lb

## 2014-11-24 DIAGNOSIS — I10 Essential (primary) hypertension: Secondary | ICD-10-CM | POA: Diagnosis not present

## 2014-11-24 DIAGNOSIS — R001 Bradycardia, unspecified: Secondary | ICD-10-CM | POA: Diagnosis not present

## 2014-11-24 DIAGNOSIS — E785 Hyperlipidemia, unspecified: Secondary | ICD-10-CM | POA: Diagnosis not present

## 2014-11-24 DIAGNOSIS — J441 Chronic obstructive pulmonary disease with (acute) exacerbation: Secondary | ICD-10-CM | POA: Diagnosis not present

## 2014-11-24 DIAGNOSIS — E669 Obesity, unspecified: Secondary | ICD-10-CM

## 2014-11-24 NOTE — Progress Notes (Signed)
Patient ID: Donald [name], male    DOB: 07/25/1933, 81 y.o.   MRN: [id number]  HPI Comments: 81 Yo male with h/o hypertension, hyperlipidemia, 50 years of smoking who stopped 14 years ago, previously presenting for shortness of breath, presents for routine followup of his bradycardia.  In follow-up today, he reports that he has a upper respiratory infection. He has almost finished antibiotics. Also had cough syrup, nasal spray. Wife also coming down with the same thing. His cough has gone from bloody now to brown Typically he walks 2 miles per day. Wife does not walk with him. Denies any chest pain or shortness of breath with exertion. Recently he has changed his diet, cutting back on his sweets/desserts  Prior total cholesterol 180. 3 years ago was 130s Continues to take Lipitor 20 g daily EKG today shows normal sinus rhythm with rate 52 bpm, right bundle branch block, no significant ST or T-wave changes Previously had PVCs. He denies any palpitations on today's visit Blood pressure typically well controlled at home  Other past medical history Previous heart rate of 32 when he saw Dr. Lake Bells in pulmonary. At the time he was having bigeminal PVCs.  He is asymptomatic from his ectopy.  Reports having PFTs in the past which showed "COPD" per the notes. Prior stress test many years ago.   Outpatient Encounter Prescriptions as of 11/24/2014  Medication Sig  . albuterol (PROVENTIL HFA;VENTOLIN HFA) 108 (90 BASE) MCG/ACT inhaler Inhale 2 puffs into the lungs every 6 (six) hours as needed for wheezing.  Marland Kitchen amLODipine (NORVASC) 5 MG tablet TAKE ONE TABLET TWICE DAILY  . atorvastatin (LIPITOR) 20 MG tablet Take 1 tablet (20 mg total) by mouth daily.  . citalopram (CELEXA) 20 MG tablet TAKE ONE (1) TABLET EACH DAY  . dutasteride (AVODART) 0.5 MG capsule Take 0.5 mg by mouth 2 (two) times a week.  . Multiple Vitamins-Minerals (MEGA MULTIVITAMIN FOR MEN) TABS Take 2 capsules by mouth daily.  Marland Kitchen  omeprazole (PRILOSEC) 20 MG capsule Take 1 capsule (20 mg total) by mouth daily.  . Tamsulosin HCl (FLOMAX) 0.4 MG CAPS Take 0.4 mg by mouth daily.  . valACYclovir (VALTREX) 500 MG tablet Take 1 tablet (500 mg total) by mouth every other day.    Review of Systems  Constitutional: Negative.   HENT: Positive for postnasal drip and rhinorrhea.   Respiratory: Positive for cough.   Cardiovascular: Negative.   Gastrointestinal: Negative.   Endocrine: Negative.   Musculoskeletal: Negative.   Neurological: Negative.   Hematological: Negative.   Psychiatric/Behavioral: Negative.   All other systems reviewed and are negative.   BP 130/60 mmHg  Pulse 52  Ht 5\' 8"  (1.727 m)  Wt 214 lb (97.07 kg)  BMI 32.55 kg/m2   Physical Exam  Constitutional: He is oriented to person, place, and time. He appears well-developed and well-nourished.  HENT:  Head: Normocephalic.  Nose: Nose normal.  Mouth/Throat: Oropharynx is clear and moist.  Eyes: Conjunctivae are normal. Pupils are equal, round, and reactive to light.  Neck: Normal range of motion. Neck supple. No JVD present.  Cardiovascular: S1 normal, S2 normal, normal heart sounds and intact distal pulses.  A regularly irregular rhythm present. Bradycardia present.  Exam reveals no gallop and no friction rub.   No murmur heard. Pulmonary/Chest: Effort normal and breath sounds normal. No respiratory distress. He has no wheezes. He has no rales. He exhibits no tenderness.  Abdominal: Soft. Bowel sounds are normal. He exhibits no distension.  There is no tenderness.  Musculoskeletal: Normal range of motion. He exhibits no edema or tenderness.  Lymphadenopathy:    He has no cervical adenopathy.  Neurological: He is alert and oriented to person, place, and time. Coordination normal.  Skin: Skin is warm and dry. No rash noted. No erythema.  Psychiatric: He has a normal mood and affect. His behavior is normal. Judgment and thought content normal.       Assessment and Plan   Nursing note and vitals reviewed.

## 2014-11-24 NOTE — Assessment & Plan Note (Signed)
Blood pressure is well controlled on today's visit. No changes made to the medications. 

## 2014-11-24 NOTE — Assessment & Plan Note (Signed)
Asymptomatic bradycardia. Will monitor for now

## 2014-11-24 NOTE — Assessment & Plan Note (Signed)
Underlying COPD, recent bronchitis. Slowly getting better  almost completed antibiotics

## 2014-11-24 NOTE — Patient Instructions (Signed)
You are doing well. No medication changes were made.  Please call us if you have new issues that need to be addressed before your next appt.  Your physician wants you to follow-up in: 12 months.  You will receive a reminder letter in the mail two months in advance. If you don't receive a letter, please call our office to schedule the follow-up appointment. 

## 2014-11-24 NOTE — Assessment & Plan Note (Signed)
Cholesterol has increased over the past several years. Encouraged weight loss, diet He is high risk of coronary artery disease given his 50 years of smoking. Discussed cholesterol goals with him. He will continue his strict diet which she started 3 weeks ago Follow-up lab work with Dr. Gilford Rile

## 2014-11-24 NOTE — Assessment & Plan Note (Signed)
We have encouraged continued exercise, careful diet management in an effort to lose weight. 

## 2014-12-07 ENCOUNTER — Telehealth: Payer: Self-pay

## 2014-12-07 ENCOUNTER — Other Ambulatory Visit: Payer: Self-pay | Admitting: *Deleted

## 2014-12-07 DIAGNOSIS — E785 Hyperlipidemia, unspecified: Secondary | ICD-10-CM

## 2014-12-07 MED ORDER — AMLODIPINE BESYLATE 5 MG PO TABS: 5.0000 mg | ORAL_TABLET | Freq: Two times a day (BID) | ORAL | Status: DC

## 2014-12-07 MED ORDER — ATORVASTATIN CALCIUM 20 MG PO TABS: 20.0000 mg | ORAL_TABLET | Freq: Every day | ORAL | Status: DC

## 2014-12-07 NOTE — Telephone Encounter (Signed)
The patient is hoping to get a refill on his Amlodipine and Atorvastatin medication.  He is hoping for 10mg  tabs for his atorvastain so he does have to cut his tablet in half.  He states he only has one day left before he is completely out.  [facility]   Pt callback - [phone]

## 2015-01-27 ENCOUNTER — Telehealth: Payer: Self-pay | Admitting: Internal Medicine

## 2015-02-13 ENCOUNTER — Ambulatory Visit (INDEPENDENT_AMBULATORY_CARE_PROVIDER_SITE_OTHER): Payer: Commercial Managed Care - HMO | Admitting: Cardiovascular Disease

## 2015-02-13 ENCOUNTER — Encounter: Payer: Self-pay | Admitting: Cardiovascular Disease

## 2015-02-13 VITALS — BP 140/58 | HR 55 | Ht 68.0 in | Wt 215.2 lb

## 2015-02-13 DIAGNOSIS — E785 Hyperlipidemia, unspecified: Secondary | ICD-10-CM

## 2015-02-13 DIAGNOSIS — I10 Essential (primary) hypertension: Secondary | ICD-10-CM

## 2015-02-13 DIAGNOSIS — E669 Obesity, unspecified: Secondary | ICD-10-CM

## 2015-02-13 DIAGNOSIS — J449 Chronic obstructive pulmonary disease, unspecified: Secondary | ICD-10-CM

## 2015-02-13 NOTE — Progress Notes (Signed)
Patient ID: Donald [name], male    DOB: 28-May-1933, 81 y.o.   MRN: [id number]  HPI Comments: 81 Yo male with h/o hypertension, hyperlipidemia, 50 years of smoking who stopped 14 years ago, previously presenting for shortness of breath, presents for routine followup of his bradycardia.  In follow-up today, he reports that he is doing well. Recently seen by the [facility] hospital, started on vitamin D. Reports that his wife is not doing as well, does not want to go out very much, likely depressed. He continues to do his walking daily with no symptoms of shortness of breath or chest pain. No excessive fatigue. No lightheadedness or dizziness. Review of lab work with him shows total cholesterol 180, LDL 110  EKG on today's visit shows normal sinus rhythm with rate 55 bpm, right bundle branch block, no significant ST or T-wave changes  Other past medical history  Continues to take Lipitor 20 mg daily Previously had PVCs.  Blood pressure typically well controlled at home  Previous heart rate of 32 when he saw Dr. Lake Bells in pulmonary. At the time he was having bigeminal PVCs.  He is asymptomatic from his ectopy.  Reports having PFTs in the past which showed "COPD" per the notes. Prior stress test many years ago.   No Known Allergies  Outpatient Encounter Prescriptions as of 02/13/2015  Medication Sig  . albuterol (PROVENTIL HFA;VENTOLIN HFA) 108 (90 BASE) MCG/ACT inhaler Inhale 2 puffs into the lungs every 6 (six) hours as needed for wheezing.  Marland Kitchen amLODipine (NORVASC) 5 MG tablet Take 1 tablet (5 mg total) by mouth 2 (two) times daily.  Marland Kitchen atorvastatin (LIPITOR) 20 MG tablet Take 1 tablet (20 mg total) by mouth daily.  . cholecalciferol (VITAMIN D) 1000 UNITS tablet Take 1,000 Units by mouth daily.  . citalopram (CELEXA) 20 MG tablet TAKE ONE (1) TABLET EACH DAY  . dutasteride (AVODART) 0.5 MG capsule Take 0.5 mg by mouth 2 (two) times a week.  . Multiple Vitamins-Minerals (MEGA MULTIVITAMIN  FOR MEN) TABS Take 2 capsules by mouth daily.  Marland Kitchen omeprazole (PRILOSEC) 20 MG capsule Take 1 capsule (20 mg total) by mouth daily.  . Tamsulosin HCl (FLOMAX) 0.4 MG CAPS Take 0.4 mg by mouth daily.  . valACYclovir (VALTREX) 500 MG tablet Take 1 tablet (500 mg total) by mouth every other day.    Past Medical History  Diagnosis Date  . Hyperlipidemia   . Sebaceous cyst   . Arthritis   . Esophageal reflux   . Hypertension     Past Surgical History  Procedure Laterality Date  . Hemorrhoid surgery      Social History  reports that he quit smoking about 15 years ago. His smoking use included Cigarettes. He smoked 1.50 packs per day for 0 years. He has never used smokeless tobacco. He reports that he does not drink alcohol or use illicit drugs.  Family History family history includes Cancer in his maternal aunt; Heart disease in his mother.   Review of Systems  Constitutional: Negative.   HENT: Negative.   Respiratory: Negative.   Cardiovascular: Negative.   Gastrointestinal: Negative.   Musculoskeletal: Negative.   Neurological: Negative.   Hematological: Negative.   Psychiatric/Behavioral: Negative.   All other systems reviewed and are negative.   BP 140/58 mmHg  Pulse 55  Ht 5\' 8"  (1.727 m)  Wt 215 lb 4 oz (97.637 kg)  BMI 32.74 kg/m2   Physical Exam  Constitutional: He is oriented to person, place,  and time. He appears well-developed and well-nourished.  HENT:  Head: Normocephalic.  Nose: Nose normal.  Mouth/Throat: Oropharynx is clear and moist.  Eyes: Conjunctivae are normal. Pupils are equal, round, and reactive to light.  Neck: Normal range of motion. Neck supple. No JVD present.  Cardiovascular: S1 normal, S2 normal, normal heart sounds and intact distal pulses.  A regularly irregular rhythm present. Bradycardia present.  Exam reveals no gallop and no friction rub.   No murmur heard. Pulmonary/Chest: Effort normal and breath sounds normal. No respiratory  distress. He has no wheezes. He has no rales. He exhibits no tenderness.  Abdominal: Soft. Bowel sounds are normal. He exhibits no distension. There is no tenderness.  Musculoskeletal: Normal range of motion. He exhibits no edema or tenderness.  Lymphadenopathy:    He has no cervical adenopathy.  Neurological: He is alert and oriented to person, place, and time. Coordination normal.  Skin: Skin is warm and dry. No rash noted. No erythema.  Psychiatric: He has a normal mood and affect. His behavior is normal. Judgment and thought content normal.      Assessment and Plan   Nursing note and vitals reviewed.

## 2015-02-13 NOTE — Assessment & Plan Note (Signed)
He reports his breathing is stable. Denies having any COPD exacerbation

## 2015-02-13 NOTE — Assessment & Plan Note (Signed)
Recommended that he stay on his Lipitor, work on his diet and weight loss

## 2015-02-13 NOTE — Assessment & Plan Note (Signed)
Blood pressure is well controlled on today's visit. No changes made to the medications. 

## 2015-02-13 NOTE — Patient Instructions (Signed)
You are doing well. No medication changes were made.  Please call us if you have new issues that need to be addressed before your next appt.  Your physician wants you to follow-up in: 6 months.  You will receive a reminder letter in the mail two months in advance. If you don't receive a letter, please call our office to schedule the follow-up appointment.   

## 2015-02-13 NOTE — Assessment & Plan Note (Signed)
We have encouraged continued exercise, careful diet management in an effort to lose weight. 

## 2015-03-03 DIAGNOSIS — N3941 Urge incontinence: Secondary | ICD-10-CM | POA: Diagnosis not present

## 2015-03-03 DIAGNOSIS — N400 Enlarged prostate without lower urinary tract symptoms: Secondary | ICD-10-CM | POA: Diagnosis not present

## 2015-03-03 DIAGNOSIS — N411 Chronic prostatitis: Secondary | ICD-10-CM | POA: Diagnosis not present

## 2015-03-03 DIAGNOSIS — N138 Other obstructive and reflux uropathy: Secondary | ICD-10-CM | POA: Diagnosis not present

## 2015-03-03 DIAGNOSIS — R3914 Feeling of incomplete bladder emptying: Secondary | ICD-10-CM | POA: Diagnosis not present

## 2015-03-03 DIAGNOSIS — R972 Elevated prostate specific antigen [PSA]: Secondary | ICD-10-CM | POA: Diagnosis not present

## 2015-03-03 DIAGNOSIS — N403 Nodular prostate with lower urinary tract symptoms: Secondary | ICD-10-CM | POA: Diagnosis not present

## 2015-03-03 DIAGNOSIS — R339 Retention of urine, unspecified: Secondary | ICD-10-CM | POA: Diagnosis not present

## 2015-03-03 DIAGNOSIS — E291 Testicular hypofunction: Secondary | ICD-10-CM | POA: Diagnosis not present

## 2015-03-03 DIAGNOSIS — A6001 Herpesviral infection of penis: Secondary | ICD-10-CM | POA: Diagnosis not present

## 2015-06-14 DIAGNOSIS — J440 Chronic obstructive pulmonary disease with acute lower respiratory infection: Secondary | ICD-10-CM | POA: Diagnosis not present

## 2015-08-11 ENCOUNTER — Ambulatory Visit: Payer: Commercial Managed Care - HMO | Admitting: *Deleted

## 2015-08-14 ENCOUNTER — Encounter (INDEPENDENT_AMBULATORY_CARE_PROVIDER_SITE_OTHER): Payer: Commercial Managed Care - HMO | Admitting: Cardiovascular Disease

## 2015-08-14 ENCOUNTER — Encounter: Payer: Self-pay | Admitting: Cardiovascular Disease

## 2015-08-14 VITALS — BP 132/60 | HR 52 | Ht 68.0 in | Wt 206.0 lb

## 2015-08-14 DIAGNOSIS — I10 Essential (primary) hypertension: Secondary | ICD-10-CM

## 2015-08-16 DIAGNOSIS — H04121 Dry eye syndrome of right lacrimal gland: Secondary | ICD-10-CM | POA: Diagnosis not present

## 2015-09-06 ENCOUNTER — Encounter: Payer: Self-pay | Admitting: Cardiovascular Disease

## 2015-09-06 ENCOUNTER — Ambulatory Visit (INDEPENDENT_AMBULATORY_CARE_PROVIDER_SITE_OTHER): Payer: Commercial Managed Care - HMO | Admitting: Cardiovascular Disease

## 2015-09-06 VITALS — BP 124/50 | HR 53 | Ht 68.0 in | Wt 211.5 lb

## 2015-09-06 DIAGNOSIS — I10 Essential (primary) hypertension: Secondary | ICD-10-CM | POA: Diagnosis not present

## 2015-09-06 DIAGNOSIS — E785 Hyperlipidemia, unspecified: Secondary | ICD-10-CM

## 2015-09-06 DIAGNOSIS — J449 Chronic obstructive pulmonary disease, unspecified: Secondary | ICD-10-CM

## 2015-09-06 DIAGNOSIS — J441 Chronic obstructive pulmonary disease with (acute) exacerbation: Secondary | ICD-10-CM | POA: Diagnosis not present

## 2015-09-06 DIAGNOSIS — E669 Obesity, unspecified: Secondary | ICD-10-CM

## 2015-09-06 DIAGNOSIS — R001 Bradycardia, unspecified: Secondary | ICD-10-CM

## 2015-09-06 MED ORDER — ATORVASTATIN CALCIUM 40 MG PO TABS: 40.0000 mg | ORAL_TABLET | Freq: Every day | ORAL | Status: DC

## 2015-09-06 NOTE — Assessment & Plan Note (Signed)
We will try to obtain his cholesterol for our records from 2016. High risk of coronary disease given long history of smoking, male. Recommended he increase his Lipitor up to 40 mg daily

## 2015-09-06 NOTE — Progress Notes (Signed)
Patient ID: Donald [name], male    DOB: November 11, 1933, 81 y.o.   MRN: [id number]  HPI Comments: 81 Yo male with h/o hypertension, hyperlipidemia, 50 years of smoking who stopped 14 years ago, previously presenting for shortness of breath, presents for routine followup of his bradycardia. Receives some of his medications from the [facility] Asymptomatic bradycardia, occasional PVCs  In follow-up today, he reports that he is doing well.he does have some very mild shortness of breath. Previously on inhalers for COPD, does not take any at this time. Most recent cholesterol panel not available. Previous total cholesterol in 2015 was 180 Walks everyday, in fact went for a walk this morning. Denies any chest pain symptoms He reports blood pressure is well controlled  EKG on today's visit shows normal sinus rhythm with rate 53 bpm, right bundle branch block, no significant ST or T-wave changes  Other past medical history Continues to take Lipitor 20 mg daily  Previous heart rate of 32 when he saw Dr. Lake Bells in pulmonary. At the time he was having bigeminal PVCs.  He is asymptomatic from his ectopy.  Reports having PFTs in the past which showed "COPD" per the notes. Prior stress test many years ago.   Allergies  Allergen Reactions  . Cephalexin Rash    Outpatient Encounter Prescriptions as of 09/06/2015  Medication Sig  . amLODipine (NORVASC) 5 MG tablet Take 1 tablet (5 mg total) by mouth 2 (two) times daily.  Marland Kitchen atorvastatin (LIPITOR) 40 MG tablet Take 1 tablet (40 mg total) by mouth daily.  . cholecalciferol (VITAMIN D) 1000 UNITS tablet Take 1,000 Units by mouth daily.  . citalopram (CELEXA) 20 MG tablet TAKE ONE (1) TABLET EACH DAY  . dutasteride (AVODART) 0.5 MG capsule Take 0.5 mg by mouth 2 (two) times a week.  . Multiple Vitamins-Minerals (MEGA MULTIVITAMIN FOR MEN) TABS Take 2 capsules by mouth daily.  Marland Kitchen omeprazole (PRILOSEC) 20 MG capsule Take 1 capsule (20 mg total) by mouth  daily.  . Tamsulosin HCl (FLOMAX) 0.4 MG CAPS Take 0.4 mg by mouth daily.  . valACYclovir (VALTREX) 500 MG tablet Take 1 tablet (500 mg total) by mouth every other day.  . [DISCONTINUED] atorvastatin (LIPITOR) 20 MG tablet Take 1 tablet (20 mg total) by mouth daily.   No facility-administered encounter medications on file as of 09/06/2015.    Past Medical History  Diagnosis Date  . Hyperlipidemia   . Sebaceous cyst   . Arthritis   . Esophageal reflux   . Hypertension     Past Surgical History  Procedure Laterality Date  . Hemorrhoid surgery      Social History  reports that he quit smoking about 16 years ago. His smoking use included Cigarettes. He smoked 1.50 packs per day for 0 years. He has never used smokeless tobacco. He reports that he does not drink alcohol or use illicit drugs.  Family History family history includes Cancer in his maternal aunt; Heart disease in his mother.   Review of Systems  Constitutional: Negative.   Respiratory: Negative.   Cardiovascular: Negative.   Gastrointestinal: Negative.   Musculoskeletal: Negative.   Neurological: Negative.   Hematological: Negative.   Psychiatric/Behavioral: Negative.   All other systems reviewed and are negative.   BP 124/50 mmHg  Pulse 53  Ht '5\' 8"'$  (1.727 m)  Wt 211 lb 8 oz (95.936 kg)  BMI 32.17 kg/m2   Physical Exam  Constitutional: He is oriented to person, place, and time. He  appears well-developed and well-nourished.  HENT:  Head: Normocephalic.  Nose: Nose normal.  Mouth/Throat: Oropharynx is clear and moist.  Eyes: Conjunctivae are normal. Pupils are equal, round, and reactive to light.  Neck: Normal range of motion. Neck supple. No JVD present.  Cardiovascular: Regular rhythm, S1 normal, S2 normal, normal heart sounds and intact distal pulses.  Bradycardia present.  Exam reveals no gallop and no friction rub.   No murmur heard. Pulmonary/Chest: Effort normal and breath sounds normal. No  respiratory distress. He has no wheezes. He has no rales. He exhibits no tenderness.  Abdominal: Soft. Bowel sounds are normal. He exhibits no distension. There is no tenderness.  Musculoskeletal: Normal range of motion. He exhibits no edema or tenderness.  Lymphadenopathy:    He has no cervical adenopathy.  Neurological: He is alert and oriented to person, place, and time. Coordination normal.  Skin: Skin is warm and dry. No rash noted. No erythema.  Psychiatric: He has a normal mood and affect. His behavior is normal. Judgment and thought content normal.      Assessment and Plan   Nursing note and vitals reviewed.

## 2015-09-06 NOTE — Assessment & Plan Note (Signed)
Currently not on inhalers. Reports that he has mild shortness of breath with exertion, stable Does not feel that he needs inhalers at this time

## 2015-09-06 NOTE — Assessment & Plan Note (Addendum)
Blood pressure is well controlled on today's visit. No changes made to the medications. Suggested he monitor his blood pressure. If this runs low, would decrease amlodipine down to 5 mg daily. This was discussed with him

## 2015-09-06 NOTE — Assessment & Plan Note (Signed)
Recommended he continue his exercise program for weight loss  Dietary changes

## 2015-09-06 NOTE — Patient Instructions (Signed)
You are doing well.  Please increase the lipitor/atorvastatin up to 40 mg once a day Aspirin every other day  Please call us if you have new issues that need to be addressed before your next appt.  Your physician wants you to follow-up in: 12 months.  You will receive a reminder letter in the mail two months in advance. If you don't receive a letter, please call our office to schedule the follow-up appointment.

## 2015-09-06 NOTE — Assessment & Plan Note (Signed)
Asymptomatic bradycardia. Currently not on any beta blockers or calcium channel blockers

## 2015-09-08 DIAGNOSIS — H26492 Other secondary cataract, left eye: Secondary | ICD-10-CM | POA: Diagnosis not present

## 2015-09-20 NOTE — Progress Notes (Signed)
This encounter was created in error - please disregard.

## 2015-10-02 ENCOUNTER — Ambulatory Visit (INDEPENDENT_AMBULATORY_CARE_PROVIDER_SITE_OTHER): Payer: Commercial Managed Care - HMO

## 2015-10-02 VITALS — BP 130/62 | HR 55 | Temp 98.0°F | Resp 14 | Ht 68.0 in | Wt 212.8 lb

## 2015-10-02 DIAGNOSIS — Z0000 Encounter for general adult medical examination without abnormal findings: Secondary | ICD-10-CM

## 2015-10-02 NOTE — Progress Notes (Signed)
Annual Wellness Visit as completed by Health Coach was reviewed in full.  

## 2015-10-02 NOTE — Progress Notes (Signed)
Subjective:   Donald [name] is a 81 y.o. male who presents for Medicare Annual/Subsequent preventive examination.  Review of Systems:  No ROS.  Medicare Wellness Visit.  Cardiac Risk Factors include: none;advanced age (>21mn, >>48women);male gender     Objective:    Vitals: BP 130/62 mmHg  Pulse 55  Temp(Src) 98 F (36.7 C) (Oral)  Resp 14  Ht '5\' 8"'$  (1.727 m)  Wt 212 lb 12.8 oz (96.525 kg)  BMI 32.36 kg/m2  SpO2 96%  Tobacco History  Smoking status  . Former Smoker -- 1.50 packs/day for 0 years  . Types: Cigarettes  . Quit date: 09/10/1999  Smokeless tobacco  . Never Used     Counseling given: Not Answered   Past Medical History  Diagnosis Date  . Hyperlipidemia   . Sebaceous cyst   . Arthritis   . Esophageal reflux   . Hypertension    Past Surgical History  Procedure Laterality Date  . Hemorrhoid surgery     Family History  Problem Relation Age of Onset  . Heart disease Mother   . Cancer Maternal Aunt     lung   History  Sexual Activity  . Sexual Activity: No    Outpatient Encounter Prescriptions as of 10/02/2015  Medication Sig  . amLODipine (NORVASC) 5 MG tablet Take 1 tablet (5 mg total) by mouth 2 (two) times daily.  .Marland Kitchenatorvastatin (LIPITOR) 40 MG tablet Take 1 tablet (40 mg total) by mouth daily.  . cholecalciferol (VITAMIN D) 1000 UNITS tablet Take 1,000 Units by mouth daily.  . citalopram (CELEXA) 20 MG tablet TAKE ONE (1) TABLET EACH DAY  . dutasteride (AVODART) 0.5 MG capsule Take 0.5 mg by mouth 2 (two) times a week.  . Multiple Vitamins-Minerals (MEGA MULTIVITAMIN FOR MEN) TABS Take 2 capsules by mouth daily.  .Marland Kitchenomeprazole (PRILOSEC) 20 MG capsule Take 1 capsule (20 mg total) by mouth daily.  . Tamsulosin HCl (FLOMAX) 0.4 MG CAPS Take 0.4 mg by mouth daily.  . valACYclovir (VALTREX) 500 MG tablet Take 1 tablet (500 mg total) by mouth every other day.   No facility-administered encounter medications on file as of 10/02/2015.     Activities of Daily Living In your present state of health, do you have any difficulty performing the following activities: 10/02/2015  Hearing? Y  Vision? N  Difficulty concentrating or making decisions? N  Walking or climbing stairs? N  Dressing or bathing? N  Doing errands, shopping? N  Preparing Food and eating ? N  Using the Toilet? N  In the past six months, have you accidently leaked urine? N  Do you have problems with loss of bowel control? N  Managing your Medications? N  Managing your Finances? N  Housekeeping or managing your Housekeeping? N    Patient Care Team: JJackolyn Confer MD as PCP - General (Internal Medicine)   Assessment:    This is a routine wellness examination for KAmed The goal of the wellness visit is to assist the patient how to close the gaps in care and create a preventative care plan for the patient.   Bone Density/Osteoporosis risk discussed; taking calcium VIT D.  Medications reviewed; taking as prescribed and without barriers.  Safety issues reviewed; smoke detectors in the home. Firearms locked in a secure area. Wears seatbelts when driving or riding with others. No violence in the home.  The patient was oriented x 3; appropriate in dress and manner and no objective failures at  ADL's or IADL's.   Patient concerns: None at this time.  Follow up with PCP as needed.   Exercise Activities and Dietary recommendations Current Exercise Habits:: Home exercise routine, Type of exercise: walking, Time (Minutes): 45, Frequency (Times/Week): 6, Weekly Exercise (Minutes/Week): 270, Intensity: Mild  Goals    . Reduce sugar intake     Patient centered goal is to decrease sugar intake by cutting portions in half and/or switching to fudge pops to help with portion control.      Fall Risk Fall Risk  10/02/2015 06/20/2014 05/05/2013 11/20/2012  Falls in the past year? No No No No   Depression Screen PHQ 2/9 Scores 10/02/2015 06/20/2014  05/05/2013 11/20/2012  PHQ - 2 Score 0 0 0 0    Cognitive Testing MMSE - Mini Mental State Exam 10/02/2015  Orientation to time 5  Orientation to Place 5  Registration 3  Attention/ Calculation 5  Recall 3  Language- name 2 objects 2  Language- repeat 1  Language- follow 3 step command 3  Language- read & follow direction 1  Write a sentence 1  Copy design 1  Total score 30    Immunization History  Administered Date(s) Administered  . Influenza Split 08/05/2011, 08/19/2012, 07/29/2013  . Influenza,inj,Quad PF,36+ Mos 07/20/2014  . Influenza-Unspecified 08/11/2015  . Pneumococcal Conjugate-13 03/18/2014  . Pneumococcal Polysaccharide-23 03/18/2008  . Tdap 09/10/2011   Screening Tests Health Maintenance  Topic Date Due  . INFLUENZA VACCINE  06/11/2016  . TETANUS/TDAP  09/09/2021  . ZOSTAVAX  Addressed  . PNA vac Low Risk Adult  Completed      Plan:    End of life planning; Advance aging; Advanced directives discussed. Living Will complete. Copy requested.   Return in 2 months for scheduled CPE with PCP.  During the course of the visit the patient was educated and counseled about the following appropriate screening and preventive services:   Vaccines to include Pneumoccal, Influenza, Hepatitis B, Td, Zostavax, HCV  Electrocardiogram  Cardiovascular Disease  Colorectal cancer screening  Diabetes screening  Prostate Cancer Screening  Glaucoma screening  Nutrition counseling   Smoking cessation counseling  Patient Instructions (the written plan) was given to the patient.    Varney Biles, LPN  [id number]

## 2015-10-02 NOTE — Patient Instructions (Addendum)
[name],  Thank you for taking time to come for your Medicare Wellness Visit.  I appreciate your ongoing commitment to your health goals. Please review the following plan we discussed and let me know if I can assist you in the future.  Bring a copy of advanced directives  Return in 2 months for physical and fasting labs with PCP  Return in 1 year for annual wellness visit with [facility], Male A healthy lifestyle and preventative care can promote health and wellness.  Maintain regular health, dental, and eye exams.  Eat a healthy diet. Foods like vegetables, fruits, whole grains, low-fat dairy products, and lean protein foods contain the nutrients you need and are low in calories. Decrease your intake of foods high in solid fats, added sugars, and salt. Get information about a proper diet from your health care provider, if necessary.  Regular physical exercise is one of the most important things you can do for your health. Most adults should get at least 150 minutes of moderate-intensity exercise (any activity that increases your heart rate and causes you to sweat) each week. In addition, most adults need muscle-strengthening exercises on 2 or more days a week.   Maintain a healthy weight. The body mass index (BMI) is a screening tool to identify possible weight problems. It provides an estimate of body fat based on height and weight. Your health care provider can find your BMI and can help you achieve or maintain a healthy weight. For males 20 years and older:  A BMI below 18.5 is considered underweight.  A BMI of 18.5 to 24.9 is normal.  A BMI of 25 to 29.9 is considered overweight.  A BMI of 30 and above is considered obese.  Maintain normal blood lipids and cholesterol by exercising and minimizing your intake of saturated fat. Eat a balanced diet with plenty of fruits and vegetables. Blood tests for lipids and cholesterol should begin at age 42 and be  repeated every 5 years. If your lipid or cholesterol levels are high, you are over age 87, or you are at high risk for heart disease, you may need your cholesterol levels checked more frequently.Ongoing high lipid and cholesterol levels should be treated with medicines if diet and exercise are not working.  If you smoke, find out from your health care provider how to quit. If you do not use tobacco, do not start.  Lung cancer screening is recommended for adults aged 98-80 years who are at high risk for developing lung cancer because of a history of smoking. A yearly low-dose CT scan of the lungs is recommended for people who have at least a 30-pack-year history of smoking and are current smokers or have quit within the past 15 years. A pack year of smoking is smoking an average of 1 pack of cigarettes a day for 1 year (for example, a 30-pack-year history of smoking could mean smoking 1 pack a day for 30 years or 2 packs a day for 15 years). Yearly screening should continue until the smoker has stopped smoking for at least 15 years. Yearly screening should be stopped for people who develop a health problem that would prevent them from having lung cancer treatment.  If you choose to drink alcohol, do not have more than 2 drinks per day. One drink is considered to be 12 oz (360 mL) of beer, 5 oz (150 mL) of wine, or 1.5 oz (45 mL) of liquor.  Avoid the use  of street drugs. Do not share needles with anyone. Ask for help if you need support or instructions about stopping the use of drugs.  High blood pressure causes heart disease and increases the risk of stroke. High blood pressure is more likely to develop in:  People who have blood pressure in the end of the normal range (100-139/85-89 mm Hg).  People who are overweight or obese.  People who are African American.  If you are 32-70 years of age, have your blood pressure checked every 3-5 years. If you are 66 years of age or older, have your blood  pressure checked every year. You should have your blood pressure measured twice--once when you are at a hospital or clinic, and once when you are not at a hospital or clinic. Record the average of the two measurements. To check your blood pressure when you are not at a hospital or clinic, you can use:  An automated blood pressure machine at a pharmacy.  A home blood pressure monitor.  If you are 66-65 years old, ask your health care provider if you should take aspirin to prevent heart disease.  Diabetes screening involves taking a blood sample to check your fasting blood sugar level. This should be done once every 3 years after age 18 if you are at a normal weight and without risk factors for diabetes. Testing should be considered at a younger age or be carried out more frequently if you are overweight and have at least 1 risk factor for diabetes.  Colorectal cancer can be detected and often prevented. Most routine colorectal cancer screening begins at the age of 73 and continues through age 61. However, your health care provider may recommend screening at an earlier age if you have risk factors for colon cancer. On a yearly basis, your health care provider may provide home test kits to check for hidden blood in the stool. A small camera at the end of a tube may be used to directly examine the colon (sigmoidoscopy or colonoscopy) to detect the earliest forms of colorectal cancer. Talk to your health care provider about this at age 81 when routine screening begins. A direct exam of the colon should be repeated every 5-10 years through age 46, unless early forms of precancerous polyps or small growths are found.  People who are at an increased risk for hepatitis B should be screened for this virus. You are considered at high risk for hepatitis B if:  You were born in a country where hepatitis B occurs often. Talk with your health care provider about which countries are considered high risk.  Your  parents were born in a high-risk country and you have not received a shot to protect against hepatitis B (hepatitis B vaccine).  You have HIV or AIDS.  You use needles to inject street drugs.  You live with, or have sex with, someone who has hepatitis B.  You are a man who has sex with other men (MSM).  You get hemodialysis treatment.  You take certain medicines for conditions like cancer, organ transplantation, and autoimmune conditions.  Hepatitis C blood testing is recommended for all people born from [birth year] through 1963 and any individual with known risk factors for hepatitis C.  Healthy men should no longer receive prostate-specific antigen (PSA) blood tests as part of routine cancer screening. Talk to your health care provider about prostate cancer screening.  Testicular cancer screening is not recommended for adolescents or adult males who have no symptoms.  Screening includes self-exam, a health care provider exam, and other screening tests. Consult with your health care provider about any symptoms you have or any concerns you have about testicular cancer.  Practice safe sex. Use condoms and avoid high-risk sexual practices to reduce the spread of sexually transmitted infections (STIs).  You should be screened for STIs, including gonorrhea and chlamydia if:  You are sexually active and are younger than 24 years.  You are older than 24 years, and your health care provider tells you that you are at risk for this type of infection.  Your sexual activity has changed since you were last screened, and you are at an increased risk for chlamydia or gonorrhea. Ask your health care provider if you are at risk.  If you are at risk of being infected with HIV, it is recommended that you take a prescription medicine daily to prevent HIV infection. This is called pre-exposure prophylaxis (PrEP). You are considered at risk if:  You are a man who has sex with other men (MSM).  You are a  heterosexual man who is sexually active with multiple partners.  You take drugs by injection.  You are sexually active with a partner who has HIV.  Talk with your health care provider about whether you are at high risk of being infected with HIV. If you choose to begin PrEP, you should first be tested for HIV. You should then be tested every 3 months for as long as you are taking PrEP.  Use sunscreen. Apply sunscreen liberally and repeatedly throughout the day. You should seek shade when your shadow is shorter than you. Protect yourself by wearing long sleeves, pants, a wide-brimmed hat, and sunglasses year round whenever you are outdoors.  Tell your health care provider of new moles or changes in moles, especially if there is a change in shape or color. Also, tell your health care provider if a mole is larger than the size of a pencil eraser.  A one-time screening for abdominal aortic aneurysm (AAA) and surgical repair of large AAAs by ultrasound is recommended for men aged 24-75 years who are current or former smokers.  Stay current with your vaccines (immunizations).   This information is not intended to replace advice given to you by your health care provider. Make sure you discuss any questions you have with your health care provider.   Document Released: 04/25/2008 Document Revised: 11/18/2014 Document Reviewed: 03/25/2011 Elsevier Interactive Patient Education [employer].

## 2015-10-26 ENCOUNTER — Telehealth: Payer: Self-pay | Admitting: *Deleted

## 2015-10-26 NOTE — Telephone Encounter (Signed)
Spoke with patient ,he will come tomorrow to see you.

## 2015-10-26 NOTE — Telephone Encounter (Signed)
Patient requested a referral to the pulmonary,- Dr Rosita Fire is preferred . Please Advise

## 2015-10-26 NOTE — Telephone Encounter (Signed)
We should make a 70mn follow up with me first. He may need other evaluation for dyspnea, such as a cardiac workup.

## 2015-10-26 NOTE — Telephone Encounter (Signed)
Spoke with the patient, he stated he was Diagnosised with Copd, years ago, and used to use breathing treatments (Referred to the pulmonary doctors that used to be in our office) from the past providers and then was told he didn;t have COPD anymore.  He has been feeling like he gets SOB at times and would like the breathing treatments back and to be evaluated by a pulmonologist .   Please advise?

## 2015-10-26 NOTE — Telephone Encounter (Signed)
Please advise referral?  

## 2015-10-26 NOTE — Telephone Encounter (Signed)
What is this for

## 2015-10-27 ENCOUNTER — Encounter: Payer: Self-pay | Admitting: Internal Medicine

## 2015-10-27 ENCOUNTER — Ambulatory Visit (INDEPENDENT_AMBULATORY_CARE_PROVIDER_SITE_OTHER): Payer: Commercial Managed Care - HMO | Admitting: Internal Medicine

## 2015-10-27 VITALS — BP 146/50 | HR 55 | Temp 97.7°F | Wt 217.0 lb

## 2015-10-27 DIAGNOSIS — J441 Chronic obstructive pulmonary disease with (acute) exacerbation: Secondary | ICD-10-CM | POA: Diagnosis not present

## 2015-10-27 MED ORDER — AMOXICILLIN-POT CLAVULANATE 875-125 MG PO TABS: 1.0000 | ORAL_TABLET | Freq: Two times a day (BID) | ORAL | Status: DC

## 2015-10-27 MED ORDER — HYDROCOD POLST-CPM POLST ER 10-8 MG/5ML PO SUER: 5.0000 mL | Freq: Two times a day (BID) | ORAL | Status: DC | PRN

## 2015-10-27 MED ORDER — PREDNISONE 10 MG PO TABS: ORAL_TABLET | ORAL | Status: DC

## 2015-10-27 MED ORDER — ALBUTEROL SULFATE HFA 108 (90 BASE) MCG/ACT IN AERS: 2.0000 | INHALATION_SPRAY | Freq: Four times a day (QID) | RESPIRATORY_TRACT | Status: DC | PRN

## 2015-10-27 NOTE — Patient Instructions (Signed)
Start Augmentin and Prednisone. Use Tussionex for cough. Use Albuterol as needed for shortness of breath. We will set up referral to Pulmonary.  Acute Bronchitis Bronchitis is inflammation of the airways that extend from the windpipe into the lungs (bronchi). The inflammation often causes mucus to develop. This leads to a cough, which is the most common symptom of bronchitis.  In acute bronchitis, the condition usually develops suddenly and goes away over time, usually in a couple weeks. Smoking, allergies, and asthma can make bronchitis worse. Repeated episodes of bronchitis may cause further lung problems.  CAUSES Acute bronchitis is most often caused by the same virus that causes a cold. The virus can spread from person to person (contagious) through coughing, sneezing, and touching contaminated objects. SIGNS AND SYMPTOMS   Cough.   Fever.   Coughing up mucus.   Body aches.   Chest congestion.   Chills.   Shortness of breath.   Sore throat.  DIAGNOSIS  Acute bronchitis is usually diagnosed through a physical exam. Your health care provider will also ask you questions about your medical history. Tests, such as chest X-rays, are sometimes done to rule out other conditions.  TREATMENT  Acute bronchitis usually goes away in a couple weeks. Oftentimes, no medical treatment is necessary. Medicines are sometimes given for relief of fever or cough. Antibiotic medicines are usually not needed but may be prescribed in certain situations. In some cases, an inhaler may be recommended to help reduce shortness of breath and control the cough. A cool mist vaporizer may also be used to help thin bronchial secretions and make it easier to clear the chest.  HOME CARE INSTRUCTIONS  Get plenty of rest.   Drink enough fluids to keep your urine clear or pale yellow (unless you have a medical condition that requires fluid restriction). Increasing fluids may help thin your respiratory  secretions (sputum) and reduce chest congestion, and it will prevent dehydration.   Take medicines only as directed by your health care provider.  If you were prescribed an antibiotic medicine, finish it all even if you start to feel better.  Avoid smoking and secondhand smoke. Exposure to cigarette smoke or irritating chemicals will make bronchitis worse. If you are a smoker, consider using nicotine gum or skin patches to help control withdrawal symptoms. Quitting smoking will help your lungs heal faster.   Reduce the chances of another bout of acute bronchitis by washing your hands frequently, avoiding people with cold symptoms, and trying not to touch your hands to your mouth, nose, or eyes.   Keep all follow-up visits as directed by your health care provider.  SEEK MEDICAL CARE IF: Your symptoms do not improve after 1 week of treatment.  SEEK IMMEDIATE MEDICAL CARE IF:  You develop an increased fever or chills.   You have chest pain.   You have severe shortness of breath.  You have bloody sputum.   You develop dehydration.  You faint or repeatedly feel like you are going to pass out.  You develop repeated vomiting.  You develop a severe headache. MAKE SURE YOU:   Understand these instructions.  Will watch your condition.  Will get help right away if you are not doing well or get worse.   This information is not intended to replace advice given to you by your health care provider. Make sure you discuss any questions you have with your health care provider.   Document Released: 12/05/2004 Document Revised: 11/18/2014 Document Reviewed: 04/20/2013 Elsevier Interactive  Patient Education 2016 [employer].

## 2015-10-27 NOTE — Assessment & Plan Note (Signed)
Symptoms most consistent with COPD exacerbation with bronchitis. Will start Augmentin and Prednisone taper. Tussionex for cough. Albuterol prn for dyspnea. Will set up follow up with pulmonary. Follow up here if symptoms are not improving.

## 2015-10-27 NOTE — Progress Notes (Signed)
Subjective:    Patient ID: [place] [name], male    DOB: [birth year], 81 y.o.   MRN: [id number]  HPI  81YO male presents for acute visit.  Feeling more short of breath in general, and much worse last few days. Coughing over last few days. Cough productive of green mucous last few days. No fever. Previously seen by Dr. [name]. At that time, inhalers were stopped. Former smoker. Quit about 17 years ago. Not taking any medication for current symptoms.  Walks every morning and recently has to stop and rest after 1/10 mile. No chest pain.  Recently seen by Cardiology and evaluation was normal.  Wt Readings from Last 3 Encounters:  10/27/15 217 lb (98.431 kg)  10/02/15 212 lb 12.8 oz (96.525 kg)  09/06/15 211 lb 8 oz (95.936 kg)   BP Readings from Last 3 Encounters:  10/27/15 146/50  10/02/15 130/62  09/06/15 124/50    Past Medical History  Diagnosis Date  . Hyperlipidemia   . Sebaceous cyst   . Arthritis   . Esophageal reflux   . Hypertension    Family History  Problem Relation Age of Onset  . Heart disease Mother   . Cancer Maternal Aunt     lung   Past Surgical History  Procedure Laterality Date  . Hemorrhoid surgery     Social History   Social History  . Marital Status: Married    Spouse Name: N/A  . Number of Children: N/A  . Years of Education: N/A   Social History Main Topics  . Smoking status: Former Smoker -- 1.50 packs/day for 0 years    Types: Cigarettes    Quit date: 09/10/1999  . Smokeless tobacco: Never Used  . Alcohol Use: No  . Drug Use: No  . Sexual Activity: No   Other Topics Concern  . None   Social History Narrative   Lives in [name] with wife. Dog and cat in home.      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  Dietitian.      Diet - regular      Exercise - Walking    Review of Systems  Constitutional: Positive for fatigue. Negative for fever, chills and activity change.  HENT: Positive for congestion. Negative for ear  discharge, ear pain, hearing loss, nosebleeds, postnasal drip, rhinorrhea, sinus pressure, sneezing, sore throat, tinnitus, trouble swallowing and voice change.   Eyes: Negative for discharge, redness, itching and visual disturbance.  Respiratory: Positive for cough, chest tightness and shortness of breath. Negative for wheezing and stridor.   Cardiovascular: Negative for chest pain, palpitations and leg swelling.  Musculoskeletal: Negative for myalgias, arthralgias, neck pain and neck stiffness.  Skin: Negative for color change and rash.  Neurological: Negative for dizziness, facial asymmetry and headaches.  Psychiatric/Behavioral: Negative for sleep disturbance.       Objective:    BP 146/50 mmHg  Pulse 55  Temp(Src) 97.7 F (36.5 C) (Oral)  Wt 217 lb (98.431 kg)  SpO2 97% Physical Exam  Constitutional: He is oriented to person, place, and time. He appears well-developed and well-nourished. No distress.  HENT:  Head: Normocephalic and atraumatic.  Right Ear: External ear normal.  Left Ear: External ear normal.  Nose: Nose normal.  Mouth/Throat: Oropharynx is clear and moist. No oropharyngeal exudate.  Eyes: Conjunctivae and EOM are normal. Pupils are equal, round, and reactive to light. Right eye exhibits no discharge. Left eye exhibits no discharge. No scleral icterus.  Neck: Normal range  of motion. Neck supple. No tracheal deviation present. No thyromegaly present.  Cardiovascular: Normal rate, regular rhythm and normal heart sounds.  Exam reveals no gallop and no friction rub.   No murmur heard. Pulmonary/Chest: Effort normal. No accessory muscle usage. No tachypnea. No respiratory distress. He has decreased breath sounds (prolonged expiration). He has no wheezes. He has rhonchi (scattered). He has no rales. He exhibits no tenderness.  Musculoskeletal: Normal range of motion. He exhibits no edema.  Lymphadenopathy:    He has no cervical adenopathy.  Neurological: He is alert  and oriented to person, place, and time. No cranial nerve deficit. Coordination normal.  Skin: Skin is warm and dry. No rash noted. He is not diaphoretic. No erythema. No pallor.  Psychiatric: He has a normal mood and affect. His behavior is normal. Judgment and thought content normal.          Assessment & Plan:   Problem List Items Addressed This Visit      Unprioritized   COPD exacerbation ([name]) - Primary    Symptoms most consistent with COPD exacerbation with bronchitis. Will start Augmentin and Prednisone taper. Tussionex for cough. Albuterol prn for dyspnea. Will set up follow up with pulmonary. Follow up here if symptoms are not improving.      Relevant Medications   predniSONE (DELTASONE) 10 MG tablet   albuterol (PROVENTIL HFA;VENTOLIN HFA) 108 (90 BASE) MCG/ACT inhaler   amoxicillin-clavulanate (AUGMENTIN) 875-125 MG tablet   chlorpheniramine-HYDROcodone (TUSSIONEX PENNKINETIC ER) 10-8 MG/5ML SUER   Other Relevant Orders   Ambulatory referral to Pulmonology       Return if symptoms worsen or fail to improve.

## 2015-10-27 NOTE — Progress Notes (Signed)
Pre visit review using our clinic review tool, if applicable. No additional management support is needed unless otherwise documented below in the visit note. 

## 2015-11-06 DIAGNOSIS — J441 Chronic obstructive pulmonary disease with (acute) exacerbation: Secondary | ICD-10-CM | POA: Diagnosis not present

## 2015-11-06 DIAGNOSIS — R05 Cough: Secondary | ICD-10-CM | POA: Diagnosis not present

## 2015-11-17 ENCOUNTER — Encounter: Payer: Self-pay | Admitting: Internal Medicine

## 2015-11-17 ENCOUNTER — Ambulatory Visit (INDEPENDENT_AMBULATORY_CARE_PROVIDER_SITE_OTHER): Payer: Commercial Managed Care - HMO | Admitting: Internal Medicine

## 2015-11-17 VITALS — BP 150/63 | HR 56 | Temp 98.0°F | Ht 67.5 in | Wt 213.2 lb

## 2015-11-17 DIAGNOSIS — Z0000 Encounter for general adult medical examination without abnormal findings: Secondary | ICD-10-CM

## 2015-11-17 DIAGNOSIS — Z125 Encounter for screening for malignant neoplasm of prostate: Secondary | ICD-10-CM | POA: Diagnosis not present

## 2015-11-17 DIAGNOSIS — I10 Essential (primary) hypertension: Secondary | ICD-10-CM

## 2015-11-17 DIAGNOSIS — E785 Hyperlipidemia, unspecified: Secondary | ICD-10-CM

## 2015-11-17 LAB — LIPID PANEL
CHOL/HDL RATIO: 3
Cholesterol: 168 mg/dL (ref 0–200)
HDL: 50.7 mg/dL (ref >39.00)
LDL CALC: 83 mg/dL (ref 0–99)
NonHDL: 117.49
TRIGLYCERIDES: 172 mg/dL — AB (ref 0.0–149.0)
VLDL: 34.4 mg/dL (ref 0.0–40.0)

## 2015-11-17 LAB — COMPREHENSIVE METABOLIC PANEL
ALT: 16 U/L (ref 0–53)
AST: 24 U/L (ref 0–37)
Albumin: 4.3 g/dL (ref 3.5–5.2)
Alkaline Phosphatase: 72 U/L (ref 39–117)
BUN: 24 mg/dL — AB (ref 6–23)
CHLORIDE: 105 meq/L (ref 96–112)
CO2: 26 mEq/L (ref 19–32)
CREATININE: 1.34 mg/dL (ref 0.40–1.50)
Calcium: 9.8 mg/dL (ref 8.4–10.5)
GFR: 54.38 mL/min — ABNORMAL LOW (ref >60.00)
Glucose, Bld: 100 mg/dL — ABNORMAL HIGH (ref 70–99)
POTASSIUM: 4.9 meq/L (ref 3.5–5.1)
SODIUM: 137 meq/L (ref 135–145)
TOTAL PROTEIN: 6.6 g/dL (ref 6.0–8.3)
Total Bilirubin: 0.4 mg/dL (ref 0.2–1.2)

## 2015-11-17 NOTE — Assessment & Plan Note (Signed)
General medical exam normal today. Health maintenance is UTD. Note that prostate screening is performed by urologist. Discussed limitations of PSA screening. Will check PSA today. Additional labs today including CMP, CBC, lipids.  Encouraged healthy diet and regular physical activity. Immunizations are UTD.

## 2015-11-17 NOTE — Patient Instructions (Signed)

## 2015-11-17 NOTE — Progress Notes (Signed)
Subjective:    Patient ID: Donald [name], male    DOB: April 30, 1933, 82 y.o.   MRN: [id number]  HPI  The patient is here for annual Medicare wellness examination and management of other chronic and acute problems.   The risk factors are reflected in the social history.  The roster of all physicians providing medical care to patient - is listed in the Snapshot section of the chart.  Activities of daily living:  The patient is 100% independent in all ADLs: dressing, toileting, feeding as well as independent mobility.  Lives in home, 1 story, with wife and two dogs.  Hardwood floors.  Home safety : The patient has smoke detectors in the home. They wear seatbelts.  There are no firearms at home. There is no violence in the home.   There is no risks for hepatitis, STDs or HIV. There is no history of blood transfusion. They have no travel history to infectious disease endemic areas of the world.  The patient has not seen their dentist in the last six month. Has dentures in place. They [facility] Has hearing aids, but does not wear them.  They have deferred audiologic testing in the last year.   They do not  have excessive sun exposure. Discussed the need for sun protection: hats, long sleeves and use of sunscreen if there is significant sun exposure.  Dermatology - Dr. Nehemiah Massed. Pulmonary - evaluation pending  Diet: the importance of a healthy diet is discussed. They do have a healthy diet.  The benefits of regular aerobic exercise were discussed. Walks 2 miles every morning.  Depression screen: there are no signs or vegative symptoms of depression- irritability, change in appetite, anhedonia, sadness/tearfullness.  Cognitive assessment: the patient manages all their financial and personal affairs and is actively engaged. They could relate day,date,year and events.  The following portions of the patient's history were reviewed and updated  as appropriate: allergies, current medications, past family history, past medical history,  past surgical history, past social history  and problem list.  Visual acuity was not assessed per patient preference since he has regular follow up with his ophthalmologist. Hearing and body mass index were assessed and reviewed.   During the course of the visit the patient was educated and counseled about appropriate screening and preventive services including : fall prevention , diabetes screening, nutrition counseling, colorectal cancer screening, and recommended immunizations.      Wt Readings from Last 3 Encounters:  11/17/15 213 lb 4 oz (96.73 kg)  10/27/15 217 lb (98.431 kg)  10/02/15 212 lb 12.8 oz (96.525 kg)   BP Readings from Last 3 Encounters:  11/17/15 150/63  10/27/15 146/50  10/02/15 130/62    Past Medical History  Diagnosis Date  . Hyperlipidemia   . Sebaceous cyst   . Arthritis   . Esophageal reflux   . Hypertension    Family History  Problem Relation Age of Onset  . Heart disease Mother   . Cancer Maternal Aunt     lung   Past Surgical History  Procedure Laterality Date  . Hemorrhoid surgery     Social History   Social History  . Marital Status: Married    Spouse Name: N/A  . Number of Children: N/A  . Years of Education: N/A   Social History Main Topics  . Smoking status: Former Smoker -- 1.50 packs/day for 0 years    Types: Cigarettes  Quit date: 09/10/1999  . Smokeless tobacco: Never Used  . Alcohol Use: No  . Drug Use: No  . Sexual Activity: No   Other Topics Concern  . None   Social History Narrative   Lives in [place] with wife. Dog and cat in home.      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  Dietitian.      Diet - regular      Exercise - Walking    Review of Systems  Constitutional: Negative for fever, chills, activity change, appetite change, fatigue and unexpected weight change.  Eyes: Negative for visual disturbance.    Respiratory: Negative for cough, shortness of breath and wheezing.   Cardiovascular: Negative for chest pain, palpitations and leg swelling.  Gastrointestinal: Negative for nausea, vomiting, abdominal pain, diarrhea, constipation and abdominal distention.  Genitourinary: Negative for dysuria, urgency and difficulty urinating.  Musculoskeletal: Negative for arthralgias and gait problem.  Skin: Negative for color change and rash.  Hematological: Negative for adenopathy.  Psychiatric/Behavioral: Negative for suicidal ideas, sleep disturbance and dysphoric mood. The patient is not nervous/anxious.        Objective:    BP 150/63 mmHg  Pulse 56  Temp(Src) 98 F (36.7 C) (Oral)  Ht 5' 7.5" (1.715 m)  Wt 213 lb 4 oz (96.73 kg)  BMI 32.89 kg/m2  SpO2 99% Physical Exam  Constitutional: He is oriented to person, place, and time. He appears well-developed and well-nourished. No distress.  HENT:  Head: Normocephalic and atraumatic.  Right Ear: External ear normal.  Left Ear: External ear normal.  Nose: Nose normal.  Mouth/Throat: Oropharynx is clear and moist. No oropharyngeal exudate.  Eyes: Conjunctivae and EOM are normal. Pupils are equal, round, and reactive to light. Right eye exhibits no discharge. Left eye exhibits no discharge. No scleral icterus.  Neck: Normal range of motion. Neck supple. No tracheal deviation present. No thyromegaly present.  Cardiovascular: Normal rate, regular rhythm and normal heart sounds.  Exam reveals no gallop and no friction rub.   No murmur heard. Pulmonary/Chest: Effort normal and breath sounds normal. No accessory muscle usage. No tachypnea. No respiratory distress. He has no decreased breath sounds. He has no wheezes. He has no rhonchi. He has no rales. He exhibits no tenderness.  Abdominal: Soft. Bowel sounds are normal. He exhibits no distension and no mass. There is no tenderness. There is no rebound and no guarding.  Musculoskeletal: Normal range of  motion. He exhibits no edema.  Lymphadenopathy:    He has no cervical adenopathy.  Neurological: He is alert and oriented to person, place, and time. No cranial nerve deficit. Coordination normal.  Skin: Skin is warm and dry. No rash noted. He is not diaphoretic. No erythema. No pallor.  Psychiatric: He has a normal mood and affect. His behavior is normal. Judgment and thought content normal.          Assessment & Plan:  Patient was given a handout regarding current recommendations for health maintenance and preventative care on the AVS.  Problem List Items Addressed This Visit      Unprioritized   Hyperlipidemia   Relevant Orders   Comprehensive metabolic panel   Lipid panel   Hypertension   Relevant Orders   Microalbumin / creatinine urine ratio   Medicare annual wellness visit, subsequent - Primary    General medical exam normal today. Health maintenance is UTD. Note that prostate screening is performed by urologist. Discussed limitations of PSA screening. Will check PSA  today. Additional labs today including CMP, CBC, lipids.  Encouraged healthy diet and regular physical activity. Immunizations are UTD.           Other Visit Diagnoses    Screening for prostate cancer        Relevant Orders    PSA, total and free        Return in about 6 months (around 05/16/2016) for Recheck.

## 2015-11-17 NOTE — Progress Notes (Signed)
Pre visit review using our clinic review tool, if applicable. No additional management support is needed unless otherwise documented below in the visit note. 

## 2015-11-18 LAB — PSA, TOTAL AND FREE
PSA FREE PCT: 17 % — AB (ref >25)
PSA FREE: 0.3 ng/mL
PSA: 1.78 ng/mL (ref <=4.00)

## 2015-11-27 ENCOUNTER — Ambulatory Visit (INDEPENDENT_AMBULATORY_CARE_PROVIDER_SITE_OTHER): Payer: Commercial Managed Care - HMO | Admitting: Pulmonary Disease

## 2015-11-27 ENCOUNTER — Encounter: Payer: Self-pay | Admitting: Pulmonary Disease

## 2015-11-27 VITALS — BP 136/66 | HR 67 | Ht 68.0 in | Wt 216.0 lb

## 2015-11-27 DIAGNOSIS — Z87891 Personal history of nicotine dependence: Secondary | ICD-10-CM | POA: Diagnosis not present

## 2015-11-27 DIAGNOSIS — J410 Simple chronic bronchitis: Secondary | ICD-10-CM | POA: Diagnosis not present

## 2015-11-27 DIAGNOSIS — J449 Chronic obstructive pulmonary disease, unspecified: Secondary | ICD-10-CM | POA: Diagnosis not present

## 2015-11-27 DIAGNOSIS — R0600 Dyspnea, unspecified: Secondary | ICD-10-CM | POA: Diagnosis not present

## 2015-11-27 MED ORDER — UMECLIDINIUM BROMIDE 62.5 MCG/INH IN AEPB: 1.0000 | INHALATION_SPRAY | Freq: Every day | RESPIRATORY_TRACT | Status: AC

## 2015-11-27 MED ORDER — UMECLIDINIUM BROMIDE 62.5 MCG/INH IN AEPB
1.0000 | INHALATION_SPRAY | Freq: Every day | RESPIRATORY_TRACT | Status: DC
Start: 2015-11-27 — End: 2016-10-14

## 2015-11-27 MED ORDER — UMECLIDINIUM BROMIDE 62.5 MCG/INH IN AEPB
1.0000 | INHALATION_SPRAY | Freq: Every day | RESPIRATORY_TRACT | Status: DC
Start: 2015-11-27 — End: 2016-05-20

## 2015-11-27 NOTE — Addendum Note (Signed)
Addended by: Oscar La R on: 11/27/2015 09:54 AM   Modules accepted: Orders

## 2015-11-27 NOTE — Progress Notes (Signed)
PULMONARY CONSULT NOTE  Requesting MD/Service: self referred Date of consult: 11/27/15 Reason for consultation: dyspnea, cough, former smoker, mild COPD  PT PROFILE: 82 y.o. M smoker of > 50 PYs previously seen by Dr Lake Bells and diagnosed with COPD. Self referred for eval of DOE and occasional cough productive of scant discolored mucus. PFTs 2014 revealed very mild obstruction   HPI:  As above. Presents for eval of mild DOE with some day to day variation and occasional cough productive of scant discolored mucus. He is able to do activities od daily living without limitation. He is fully independent and remains active. He was followed years ago by Dr Lake Bells and diagnosed with COPD. He was previously on Spiriva and felt that it was beneficial. He has used albuterol in past and does not believe it was very helpful. He denies CP, fever, hemoptysis, LE edema, calf tenderness, PND, orthopnea. He had a respiratory tract infection a few weeks ago and had CXR performed @ [facility]  Past Medical History  Diagnosis Date  . Hyperlipidemia   . Sebaceous cyst   . Arthritis   . Esophageal reflux   . Hypertension        Sinus bradycardia      BPH  Past Surgical History  Procedure Laterality Date  . Hemorrhoid surgery      MEDICATIONS: I have reviewed all medications and confirmed regimen as documented  Social History   Social History  . Marital Status: Married    Spouse Name: N/A  . Number of Children: N/A  . Years of Education: N/A   Occupational History  . Not on file.   Social History Main Topics  . Smoking status: Former Smoker -- 1.50 packs/day for > 50 years    Types: Cigarettes    Quit date: 09/10/1999  . Smokeless tobacco: Never Used  . Alcohol Use: No  . Drug Use: No  . Sexual Activity: No   Other Topics Concern  . Not on file   Social History Narrative   Lives in [place] with wife. Dog and cat in home.      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  Actuary.      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 Heart disease Mother   . Cancer Maternal Aunt     lung    ROS: No fever, myalgias/arthralgias, unexplained weight loss or weight gain No new focal weakness or sensory deficits No otalgia, hearing loss, visual changes, nasal and sinus symptoms, mouth and throat problems No neck pain or adenopathy No abdominal pain, N/V/D, diarrhea, change in bowel pattern No dysuria, change in urinary pattern No LE edema or calf tenderness   Filed Vitals:   11/27/15 0858  BP: 136/66  Pulse: 67  Height: '5\' 8"'$  (1.727 m)  Weight: 216 lb (97.977 kg)  SpO2: 96%     EXAM:  Gen: Vigorous appearing, No respiratory distress HEENT: NCAT, TMs and canals normal, sclera white, nares and nasal mucosa normal, oropharynx normal Neck: Supple without LAN, thyromegaly, JVD Lungs: breath sounds slightly diminished, percussion note normal, No adventitious sounds Cardiovascular: Reg rhythm, rate normal, no murmurs Abdomen: Mildly obese, soft, nontender, normal BS Ext: without clubbing, cyanosis, edema Neuro: CNs grossly intact, motor and sensory intact, DTRs symmetric Skin: Limited exam, no lesions noted  DATA:   BMP Latest  Ref Rng 11/17/2015 06/20/2014 12/16/2013  Glucose 70 - 99 mg/dL 100(H) 100(H) 104(H)  BUN 6 - 23 mg/dL 24(H) 22 23  Creatinine 0.40 - 1.50 mg/dL 1.34 1.4 1.1  Sodium 135 - 145 mEq/L 137 136 137  Potassium 3.5 - 5.1 mEq/L 4.9 4.9 4.5  Chloride 96 - 112 mEq/L 105 105 107  CO2 19 - 32 mEq/L '26 26 24  '$ Calcium 8.4 - 10.5 mg/dL 9.8 9.5 9.4    CBC Latest Ref Rng 12/16/2013 09/08/2013 09/11/2011  WBC 4.5 - 10.5 K/uL 9.5 10.9(H) 10.3  Hemoglobin 13.0 - 17.0 g/dL 12.4(L) 12.5(L) 12.8(L)  Hematocrit 39.0 - 52.0 % 38.5(L) 36.2(L) 38.5(L)  Platelets 150.0 - 400.0 K/uL 188.0 179 269.0    CXR:  From 2014 - NACPD. Report from  11/06/15: mild atelectasis  IMPRESSION:   1) DOE, mild - overall he is well compensated and remains active and vigorous 2) Former smoker 3) COPD, chronic bronchitis, with mild obstruction by PFTs 2014  Previously benefited from Spiriva   PLAN:  Sample of Incruse provided and prescription entered  I will attempt to obtain images of recent CXR for my review He is outside the recommended age range for LDCT screening and this was not discussed ROV 6 weeks to assess response to Incruse   Wilhelmina Mcardle, MD [facility]

## 2016-01-09 ENCOUNTER — Ambulatory Visit: Payer: Commercial Managed Care - HMO | Admitting: Pulmonary Disease

## 2016-01-09 ENCOUNTER — Encounter: Payer: Self-pay | Admitting: *Deleted

## 2016-03-01 ENCOUNTER — Encounter: Payer: Self-pay | Admitting: Pulmonary Disease

## 2016-03-01 ENCOUNTER — Ambulatory Visit (INDEPENDENT_AMBULATORY_CARE_PROVIDER_SITE_OTHER): Payer: Commercial Managed Care - HMO | Admitting: Pulmonary Disease

## 2016-03-01 VITALS — BP 142/64 | HR 56 | Ht 68.0 in | Wt 207.0 lb

## 2016-03-01 DIAGNOSIS — J449 Chronic obstructive pulmonary disease, unspecified: Secondary | ICD-10-CM | POA: Diagnosis not present

## 2016-03-01 DIAGNOSIS — R0600 Dyspnea, unspecified: Secondary | ICD-10-CM

## 2016-03-04 NOTE — Progress Notes (Signed)
PULMONARY OFFICE FOLLOW UP NOTE  Requesting MD/Service: self referred Date of initial consultation: 11/27/15 Reason for consultation: dyspnea, cough, former smoker, mild COPD  PT PROFILE: 82 y.o. M smoker of > 50 PYs previously seen by Dr Lake Bells and diagnosed with COPD. Self referred for eval of DOE and occasional cough productive of scant discolored mucus. PFTs 2014 revealed very mild obstruction  INITIAL IMP/PLAN (11/27/15): 1) DOE, mild - overall he is well compensated and remains active and vigorous. 2) Former smoker, 3) COPD, chronic bronchitis, with mild obstruction by PFTs 2014.  Sample of Incruse provided and prescription entered, I will attempt to obtain images of recent CXR for my review. He is outside the recommended age range for LDCT screening and this was not discussed. ROV 6 weeks to assess response to Incruse  PROBLEMS: COPD - mild by PFTs 2014 Former smoker Class I dyspnea  INTERVAL HISTORY: No major events  SUBJ: Feels that his dyspnea is improved on Incruse. No new complaints. Denies CP, fever, purulent sputum, hemoptysis, LE edema and calf tenderness  OBJ: Filed Vitals:   03/01/16 1015  BP: 142/64  Pulse: 56  Height: '5\' 8"'$  (1.727 m)  Weight: 207 lb (93.895 kg)  SpO2: 97%   EXAM:  Gen: NAD HEENT: NCAT, sclera white, oropharynx normal Neck: Supple without LAN, thyromegaly, JVD Lungs: breath sounds slightly diminished, percussion note normal, no wheezes Cardiovascular: Reg rhythm, rate normal, no murmurs Abdomen: Mildly obese, soft, nontender, normal BS Ext: without clubbing, cyanosis, edema Neuro: grossly intact  DATA: No new data  IMPRESSION: Chronic obstructive pulmonary disease, unspecified COPD type (HCC)  Favorable response to Incruse Dyspnea - Class I. Little DTD variation  PLAN: Cont Incruse Cont PRN albuterol ROV 6 months    Merton Border, MD PCCM service Mobile [phone] Pager [phone] 03/04/2016

## 2016-04-02 DIAGNOSIS — N411 Chronic prostatitis: Secondary | ICD-10-CM | POA: Diagnosis not present

## 2016-04-02 DIAGNOSIS — R339 Retention of urine, unspecified: Secondary | ICD-10-CM | POA: Diagnosis not present

## 2016-04-02 DIAGNOSIS — N138 Other obstructive and reflux uropathy: Secondary | ICD-10-CM | POA: Diagnosis not present

## 2016-04-02 DIAGNOSIS — R972 Elevated prostate specific antigen [PSA]: Secondary | ICD-10-CM | POA: Diagnosis not present

## 2016-04-02 DIAGNOSIS — N403 Nodular prostate with lower urinary tract symptoms: Secondary | ICD-10-CM | POA: Diagnosis not present

## 2016-05-20 ENCOUNTER — Ambulatory Visit (INDEPENDENT_AMBULATORY_CARE_PROVIDER_SITE_OTHER): Payer: Commercial Managed Care - HMO | Admitting: Internal Medicine

## 2016-05-20 ENCOUNTER — Encounter: Payer: Self-pay | Admitting: Internal Medicine

## 2016-05-20 VITALS — BP 138/30 | HR 52 | Ht 68.0 in | Wt 205.6 lb

## 2016-05-20 DIAGNOSIS — Q846 Other congenital malformations of nails: Secondary | ICD-10-CM | POA: Diagnosis not present

## 2016-05-20 DIAGNOSIS — I10 Essential (primary) hypertension: Secondary | ICD-10-CM | POA: Diagnosis not present

## 2016-05-20 DIAGNOSIS — J449 Chronic obstructive pulmonary disease, unspecified: Secondary | ICD-10-CM

## 2016-05-20 LAB — COMPREHENSIVE METABOLIC PANEL
ALK PHOS: 66 U/L (ref 39–117)
ALT: 17 U/L (ref 0–53)
AST: 21 U/L (ref 0–37)
Albumin: 4.2 g/dL (ref 3.5–5.2)
BILIRUBIN TOTAL: 0.4 mg/dL (ref 0.2–1.2)
BUN: 27 mg/dL — ABNORMAL HIGH (ref 6–23)
CALCIUM: 9.9 mg/dL (ref 8.4–10.5)
CO2: 26 meq/L (ref 19–32)
Chloride: 109 mEq/L (ref 96–112)
Creatinine, Ser: 1.43 mg/dL (ref 0.40–1.50)
GFR: 50.39 mL/min — AB (ref >60.00)
Glucose, Bld: 98 mg/dL (ref 70–99)
POTASSIUM: 4.7 meq/L (ref 3.5–5.1)
Sodium: 141 mEq/L (ref 135–145)
TOTAL PROTEIN: 6.8 g/dL (ref 6.0–8.3)

## 2016-05-20 LAB — VITAMIN B12: Vitamin B-12: 482 pg/mL (ref 211–911)

## 2016-05-20 MED ORDER — AMLODIPINE BESYLATE 5 MG PO TABS: 5.0000 mg | ORAL_TABLET | Freq: Two times a day (BID) | ORAL | Status: DC

## 2016-05-20 NOTE — Progress Notes (Signed)
Pre visit review using our clinic review tool, if applicable. No additional management support is needed unless otherwise documented below in the visit note. 

## 2016-05-20 NOTE — Progress Notes (Signed)
Subjective:    Patient ID: Donald [name], male    DOB: [birth year], 82 y.o.   MRN: [id number]  HPI  82YO male presents for follow up.  Feeling well.  HTN - BP was higher at home 170/60. No CP, HA. Compliant with medications.  Worried about nails breaking and splitting on both hands. Sometimes applies clear polish with improvement. Ongoing for years off and on.  COPD - No recent dyspnea, cough. Compliant with medications.  Wt Readings from Last 3 Encounters:  05/20/16 205 lb 9.6 oz (93.26 kg)  03/01/16 207 lb (93.895 kg)  11/27/15 216 lb (97.977 kg)   BP Readings from Last 3 Encounters:  05/20/16 138/30  03/01/16 142/64  11/27/15 136/66    Past Medical History  Diagnosis Date  . Hyperlipidemia   . Sebaceous cyst   . Arthritis   . Esophageal reflux   . Hypertension    Family History  Problem Relation Age of Onset  . Heart disease Mother   . Cancer Maternal Aunt     lung   Past Surgical History  Procedure Laterality Date  . Hemorrhoid surgery     Social History   Social History  . Marital Status: Married    Spouse Name: N/A  . Number of Children: N/A  . Years of Education: N/A   Social History Main Topics  . Smoking status: Former Smoker -- 1.50 packs/day for 50 years    Types: Cigarettes    Quit date: 09/10/1999  . Smokeless tobacco: Never Used  . Alcohol Use: No  . Drug Use: No  . Sexual Activity: No   Other Topics Concern  . None   Social History Narrative   Lives in [place] with wife. Dog and cat in home.      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  Dietitian.      Diet - regular      Exercise - Walking    Review of Systems  Constitutional: Negative for fever, chills, activity change, appetite change, fatigue and unexpected weight change.  Eyes: Negative for visual disturbance.  Respiratory: Negative for cough and shortness of breath.   Cardiovascular: Negative for chest pain, palpitations and leg swelling.  Gastrointestinal:  Negative for nausea, vomiting, abdominal pain, diarrhea, constipation and abdominal distention.  Genitourinary: Negative for dysuria, urgency and difficulty urinating.  Musculoskeletal: Negative for arthralgias and gait problem.  Skin: Negative for color change and rash.  Hematological: Negative for adenopathy.  Psychiatric/Behavioral: Negative for sleep disturbance and dysphoric mood. The patient is not nervous/anxious.        Objective:    BP 138/30 mmHg  Pulse 52  Ht '5\' 8"'$  (1.727 m)  Wt 205 lb 9.6 oz (93.26 kg)  BMI 31.27 kg/m2  SpO2 97% Physical Exam  Constitutional: He is oriented to person, place, and time. He appears well-developed and well-nourished. No distress.  HENT:  Head: Normocephalic and atraumatic.  Right Ear: External ear normal.  Left Ear: External ear normal.  Nose: Nose normal.  Mouth/Throat: Oropharynx is clear and moist. No oropharyngeal exudate.  Eyes: Conjunctivae and EOM are normal. Pupils are equal, round, and reactive to light. Right eye exhibits no discharge. Left eye exhibits no discharge. No scleral icterus.  Neck: Normal range of motion. Neck supple. No tracheal deviation present. No thyromegaly present.  Cardiovascular: Normal rate, regular rhythm and normal heart sounds.  Exam reveals no gallop and no friction rub.   No murmur heard. Pulmonary/Chest: Effort normal and breath  sounds normal. No accessory muscle usage. No tachypnea. No respiratory distress. He has no decreased breath sounds. He has no wheezes. He has no rhonchi. He has no rales. He exhibits no tenderness.  Musculoskeletal: Normal range of motion. He exhibits no edema.  Lymphadenopathy:    He has no cervical adenopathy.  Neurological: He is alert and oriented to person, place, and time. No cranial nerve deficit. Coordination normal.  Skin: Skin is warm and dry. No rash noted. He is not diaphoretic. No erythema. No pallor.  Psychiatric: He has a normal mood and affect. His behavior is  normal. Judgment and thought content normal.          Assessment & Plan:   Problem List Items Addressed This Visit      Unprioritized   COPD (chronic obstructive pulmonary disease) (HCC) (Chronic)    Symptomatically, doing well. Continue Incruse and prn Albuterol. Follow up in 4 weeks with new PCP and prn.      Hypertension - Primary (Chronic)    BP Readings from Last 3 Encounters:  05/20/16 138/30  03/01/16 142/64  11/27/15 136/66   BP well controlled. Renal function with labs. Continue current medications.      Relevant Medications   amLODipine (NORVASC) 5 MG tablet   Other Relevant Orders   Comprehensive metabolic panel   Nail anomaly    Will check B12 with labs.      Relevant Orders   B12       Return in about 4 weeks (around 06/17/2016) for New Patient.  Ronette Deter, MD Internal Medicine [facility]

## 2016-05-20 NOTE — Assessment & Plan Note (Signed)
Will check B12 with labs.

## 2016-05-20 NOTE — Patient Instructions (Signed)
Labs today.  Follow up in 4 weeks with Dr. Caryl Bis.

## 2016-05-20 NOTE — Assessment & Plan Note (Signed)
BP Readings from Last 3 Encounters:  05/20/16 138/30  03/01/16 142/64  11/27/15 136/66   BP well controlled. Renal function with labs. Continue current medications.

## 2016-05-20 NOTE — Assessment & Plan Note (Signed)
Symptomatically, doing well. Continue Incruse and prn Albuterol. Follow up in 4 weeks with new PCP and prn.

## 2016-06-20 ENCOUNTER — Ambulatory Visit: Payer: Commercial Managed Care - HMO | Admitting: Family Medicine

## 2016-06-25 DIAGNOSIS — L501 Idiopathic urticaria: Secondary | ICD-10-CM | POA: Diagnosis not present

## 2016-08-05 ENCOUNTER — Ambulatory Visit (INDEPENDENT_AMBULATORY_CARE_PROVIDER_SITE_OTHER): Payer: Commercial Managed Care - HMO | Admitting: Family Medicine

## 2016-08-05 ENCOUNTER — Encounter: Payer: Self-pay | Admitting: Family Medicine

## 2016-08-05 ENCOUNTER — Other Ambulatory Visit: Payer: Self-pay | Admitting: Surgical

## 2016-08-05 DIAGNOSIS — I10 Essential (primary) hypertension: Secondary | ICD-10-CM

## 2016-08-05 DIAGNOSIS — J449 Chronic obstructive pulmonary disease, unspecified: Secondary | ICD-10-CM

## 2016-08-05 DIAGNOSIS — Z23 Encounter for immunization: Secondary | ICD-10-CM

## 2016-08-05 DIAGNOSIS — K219 Gastro-esophageal reflux disease without esophagitis: Secondary | ICD-10-CM | POA: Insufficient documentation

## 2016-08-05 DIAGNOSIS — E785 Hyperlipidemia, unspecified: Secondary | ICD-10-CM

## 2016-08-05 NOTE — Assessment & Plan Note (Signed)
Well-controlled at this time. Continue current medications. As needed albuterol.

## 2016-08-05 NOTE — Assessment & Plan Note (Signed)
Well-controlled at this time. Occasional cough with liquids. Continue omeprazole at this time. Continue to monitor.

## 2016-08-05 NOTE — Patient Instructions (Signed)
Nice to see you. Please continue your current blood pressure and COPD medications as well as your cholesterol medication. Please monitor for worsening of your COPD with increased cough, breathing issues, or productive cough. If this occurs let us know. If you have trouble breathing seek medical attention immediately.

## 2016-08-05 NOTE — Progress Notes (Signed)
Pre visit review using our clinic review tool, if applicable. No additional management support is needed unless otherwise documented below in the visit note. 

## 2016-08-05 NOTE — Progress Notes (Signed)
  Tommi Rumps, MD Phone: [phone]  Donald [name] is a 82 y.o. male who presents today for follow-up.  HYPERTENSION Disease Monitoring: Blood pressure range-120s over 60s Chest pain- no      Dyspnea- no Medications: Compliance- taking Norvasc    Edema- no  HYPERLIPIDEMIA Disease Monitoring: See symptoms for Hypertension Medications: Compliance- taking Lipitor Right upper quadrant pain- no  Muscle aches- no  COPD: Well controlled. Really only uses his albuterol in the winter. Has not used albuterol in some time. Is also on incruse. No cough.  GERD: Stable on omeprazole. Does get some cough occasionally when he swallows liquids though not solids. Reports he has had extensive workup for this in the past with EGD.   PMH: Former smoker   ROS see history of present illness  Objective  Physical Exam Vitals:   08/05/16 0912  BP: 124/66  Pulse: 62  Temp: 98.2 F (36.8 C)    BP Readings from Last 3 Encounters:  08/05/16 124/66  05/20/16 (!) 138/30  03/01/16 (!) 142/64   Wt Readings from Last 3 Encounters:  08/05/16 208 lb 3.2 oz (94.4 kg)  05/20/16 205 lb 9.6 oz (93.3 kg)  03/01/16 207 lb (93.9 kg)    Physical Exam  Constitutional: He is well-developed, well-nourished, and in no distress.  HENT:  Head: Normocephalic and atraumatic.  Cardiovascular: Normal rate, regular rhythm and normal heart sounds.   Pulmonary/Chest: Effort normal and breath sounds normal.  Abdominal: Soft. Bowel sounds are normal. He exhibits no distension. There is no tenderness.  Neurological: He is alert. Gait normal.  Skin: Skin is warm and dry. He is not diaphoretic.     Assessment/Plan: Please see individual problem list.  Hypertension At goal. Continue Norvasc.  COPD (chronic obstructive pulmonary disease) () Well-controlled at this time. Continue current medications. As needed albuterol.  Hyperlipidemia Tolerating Lipitor. Plan on rechecking labs in January.  GERD  (gastroesophageal reflux disease) Well-controlled at this time. Occasional cough with liquids. Continue omeprazole at this time. Continue to monitor.   Orders Placed This Encounter  Procedures  . Flu vaccine HIGH DOSE PF    Tommi Rumps, MD [place]

## 2016-08-05 NOTE — Assessment & Plan Note (Signed)
At goal. Continue Norvasc.

## 2016-08-05 NOTE — Assessment & Plan Note (Signed)
Tolerating Lipitor. Plan on rechecking labs in January.

## 2016-08-26 ENCOUNTER — Ambulatory Visit (INDEPENDENT_AMBULATORY_CARE_PROVIDER_SITE_OTHER): Payer: Commercial Managed Care - HMO

## 2016-08-26 ENCOUNTER — Ambulatory Visit (INDEPENDENT_AMBULATORY_CARE_PROVIDER_SITE_OTHER): Payer: Medicare HMO | Admitting: Family Medicine

## 2016-08-26 VITALS — BP 134/62 | HR 56 | Temp 98.2°F | Wt 209.4 lb

## 2016-08-26 DIAGNOSIS — M79662 Pain in left lower leg: Secondary | ICD-10-CM | POA: Diagnosis not present

## 2016-08-26 DIAGNOSIS — M25562 Pain in left knee: Secondary | ICD-10-CM

## 2016-08-26 DIAGNOSIS — M25572 Pain in left ankle and joints of left foot: Secondary | ICD-10-CM

## 2016-08-26 DIAGNOSIS — M19072 Primary osteoarthritis, left ankle and foot: Secondary | ICD-10-CM | POA: Diagnosis not present

## 2016-08-26 DIAGNOSIS — M898X6 Other specified disorders of bone, lower leg: Secondary | ICD-10-CM

## 2016-08-26 DIAGNOSIS — M179 Osteoarthritis of knee, unspecified: Secondary | ICD-10-CM | POA: Diagnosis not present

## 2016-08-26 NOTE — Assessment & Plan Note (Signed)
Patient with joint pain in left ankle, knee, and occasionally hip. Mild tenderness above his left ankle. No other obvious abnormalities. Does not sound as though this is coming from his back given his description. We will obtain x-rays of his knee, tibia, and ankle as these are bothering him the most. He can continue gabapentin. He'll increase to 300 mg nightly. He can continue Motrin as well though advised he take this with food. He'll continue to monitor. We'll determine the next step in management after x-rays return.

## 2016-08-26 NOTE — Progress Notes (Signed)
Pre visit review using our clinic review tool, if applicable. No additional management support is needed unless otherwise documented below in the visit note. 

## 2016-08-26 NOTE — Patient Instructions (Signed)
Nice to see you. We're going to obtain some x-rays to evaluate her knee, lower leg, and ankle. You can try Tylenol or Motrin over-the-counter. You need to take the Motrin with food. She should continue the gabapentin that the [place] prescribed. If you develop numbness, weakness, worsening pain, or any new or changing symptoms please seek medical attention.

## 2016-08-26 NOTE — Progress Notes (Signed)
Tommi Rumps, MD Phone: [phone]  Donald [name] is a 82 y.o. male who presents today for same-day visit.  Patient reports for the last 5-6 weeks he's had scattered pains in his left leg. Notes they bounce around from his ankle to his knee to his hip. Sometimes it's all 3 and other times it's 2 of the 3. Has had a couple cramps as well in his thigh. No swelling. No back pain. No numbness or weakness. No loss of bowel or bladder function, saddle anesthesia, or fevers. He saw his doctor at the [place] and they prescribed gabapentin. He is currently taking 200 mg of gabapentin at night with no benefit. He saw a chiropractor and they thought he had a pinched nerve in his back. They did stretch him. He additionally notes his anterior tibia hurts at times though he has no calf pain with this. He occasionally takes Motrin for this.   ROS see history of present illness  Objective  Physical Exam Vitals:   08/26/16 1312  BP: 134/62  Pulse: (!) 56  Temp: 98.2 F (36.8 C)    BP Readings from Last 3 Encounters:  08/26/16 134/62  08/05/16 124/66  05/20/16 (!) 138/30   Wt Readings from Last 3 Encounters:  08/26/16 209 lb 6.4 oz (95 kg)  08/05/16 208 lb 3.2 oz (94.4 kg)  05/20/16 205 lb 9.6 oz (93.3 kg)    Physical Exam  Constitutional: He is well-developed, well-nourished, and in no distress.  Cardiovascular: Normal rate, regular rhythm and normal heart sounds.   Pulmonary/Chest: Effort normal and breath sounds normal.  Musculoskeletal: He exhibits no edema.  Left lower extremity with no swelling, there is mild tenderness just superior of the left medial and lateral malleoli, no tenderness of the tibia or fibula, no calf tenderness or cords, no joint line tenderness of the knee, no swelling or warmth or erythema of the knee, no tenderness of the hip, no discomfort on internal or external range of motion of the left hip, right lower extremity is no swelling, ankle tenderness, calf  tenderness, tibial tenderness, knee tenderness, warmth, or swelling, or hip tenderness, right hip with no pain on internal or external range of motion, no midline spine tenderness or midline spine step-off, no muscular back tenderness  Neurological: He is alert. Gait normal.  5 out of 5 strength bilateral quads, hamstrings, plantar flexion, and dorsiflexion, sensation to light touch intact in bilateral lower extremities, absent patellar reflexes bilaterally     Assessment/Plan: Please see individual problem list.  Pain in joint involving left lower leg Patient with joint pain in left ankle, knee, and occasionally hip. Mild tenderness above his left ankle. No other obvious abnormalities. Does not sound as though this is coming from his back given his description. We will obtain x-rays of his knee, tibia, and ankle as these are bothering him the most. He can continue gabapentin. He'll increase to 300 mg nightly. He can continue Motrin as well though advised he take this with food. He'll continue to monitor. We'll determine the next step in management after x-rays return.   Orders Placed This Encounter  Procedures  . DG Knee Complete 4 Views Left    Standing Status:   Future    Number of Occurrences:   1    Standing Expiration Date:   10/26/2017    Order Specific Question:   Reason for Exam (SYMPTOM  OR DIAGNOSIS REQUIRED)    Answer:   left knee pain for 5-6 weeks  Order Specific Question:   Preferred imaging location?    Answer:   [facility]  . DG Ankle Complete Left    Standing Status:   Future    Number of Occurrences:   1    Standing Expiration Date:   10/26/2017    Order Specific Question:   Reason for Exam (SYMPTOM  OR DIAGNOSIS REQUIRED)    Answer:   left ankle pain for 5-6 weeks intermittently    Order Specific Question:   Preferred imaging location?    Answer:   [facility]  . DG Tibia/Fibula Left    Standing Status:   Future    Number of  Occurrences:   1    Standing Expiration Date:   10/26/2017    Order Specific Question:   Reason for Exam (SYMPTOM  OR DIAGNOSIS REQUIRED)    Answer:   left tibia pain for 5-6 weeks, no injury    Order Specific Question:   Preferred imaging location?    Answer:   [facility]    Tommi Rumps, MD [place]

## 2016-08-29 ENCOUNTER — Telehealth: Payer: Self-pay

## 2016-08-29 NOTE — Telephone Encounter (Signed)
Patient walked in requesting his Xray results from Monday.  Asked PCP for results, verbally reviewed with patient.  He will follow up at the [place] with a back Xray as it is free there.  He will continue to try the meds and let us know if they don't help (taking gabapentin and Motrin). And then will follow up with you. thanks

## 2016-09-06 ENCOUNTER — Ambulatory Visit (INDEPENDENT_AMBULATORY_CARE_PROVIDER_SITE_OTHER): Payer: Medicare HMO | Admitting: Cardiovascular Disease

## 2016-09-06 ENCOUNTER — Encounter: Payer: Self-pay | Admitting: Cardiovascular Disease

## 2016-09-06 VITALS — BP 140/58 | HR 58 | Ht 68.0 in | Wt 209.8 lb

## 2016-09-06 DIAGNOSIS — E7800 Pure hypercholesterolemia, unspecified: Secondary | ICD-10-CM | POA: Diagnosis not present

## 2016-09-06 DIAGNOSIS — I10 Essential (primary) hypertension: Secondary | ICD-10-CM | POA: Diagnosis not present

## 2016-09-06 DIAGNOSIS — R001 Bradycardia, unspecified: Secondary | ICD-10-CM | POA: Diagnosis not present

## 2016-09-06 DIAGNOSIS — J449 Chronic obstructive pulmonary disease, unspecified: Secondary | ICD-10-CM | POA: Diagnosis not present

## 2016-09-06 NOTE — Patient Instructions (Signed)

## 2016-09-06 NOTE — Progress Notes (Signed)
Cardiology Office Note  Date:  09/06/2016   ID:  Donald [name], DOB 04-01-1933, MRN [id number]  PCP:  Tommi Rumps, MD   Chief Complaint  Patient presents with  . other    1 year f/u.    HPI:  82 Yo male with h/o hypertension, hyperlipidemia, 50 years of smoking who stopped 14 years ago, previously presenting for shortness of breath, presents for routine followup of his bradycardia, Shortness of breath Receives some of his medications from the [facility] Asymptomatic bradycardia, occasional PVCs Previous stress test 10/ 2014 no ischemia  In follow-up today he reports that he feels well Chronic mild shortness of breath, feels this is stable on his current inhalers He has chronic Left leg pain, pain seems to move around, possibly from his lower back Feels like a nerve pain  SBP 120 - 130 at home Denies any chest pain concerning for angina  Labs reviewed with him Total chol 168, LDL 83 Watching his diet  EKG on today's visit shows normal sinus rhythm with rate 58 bpm, right bundle branch block  Other past medical history Continues to take Lipitor 40 mg daily  Previous heart rate of 32 when he saw Dr. Lake Bells in pulmonary. At the time he was having bigeminal PVCs.  He is asymptomatic from his ectopy.  Reports having PFTs in the past which showed "COPD" per the notes. Prior stress test many years ago.  PMH:   has a past medical history of Arthritis; Esophageal reflux; Hyperlipidemia; Hypertension; and Sebaceous cyst.  PSH:    Past Surgical History:  Procedure Laterality Date  . HEMORRHOID SURGERY      Current Outpatient Prescriptions  Medication Sig Dispense Refill  . albuterol (PROVENTIL HFA;VENTOLIN HFA) 108 (90 BASE) MCG/ACT inhaler Inhale 2 puffs into the lungs every 6 (six) hours as needed for wheezing or shortness of breath. 1 Inhaler 0  . amLODipine (NORVASC) 5 MG tablet Take 1 tablet (5 mg total) by mouth 2 (two) times daily. 180 tablet 1  .  atorvastatin (LIPITOR) 40 MG tablet Take 1 tablet (40 mg total) by mouth daily. 30 tablet 12  . cholecalciferol (VITAMIN D) 1000 UNITS tablet Take 1,000 Units by mouth daily.    . citalopram (CELEXA) 20 MG tablet TAKE ONE (1) TABLET EACH DAY 90 tablet 1  . dutasteride (AVODART) 0.5 MG capsule Take 0.5 mg by mouth 2 (two) times a week.    . Multiple Vitamins-Minerals (MEGA MULTIVITAMIN FOR MEN) TABS Take 2 capsules by mouth daily.    Marland Kitchen omeprazole (PRILOSEC) 20 MG capsule Take 1 capsule (20 mg total) by mouth daily. 30 capsule 6  . Tamsulosin HCl (FLOMAX) 0.4 MG CAPS Take 0.4 mg by mouth daily.    Marland Kitchen Umeclidinium Bromide (INCRUSE ELLIPTA) 62.5 MCG/INH AEPB Inhale 1 puff into the lungs daily. 30 each 5  . valACYclovir (VALTREX) 500 MG tablet Take 1 tablet (500 mg total) by mouth every other day. 15 tablet 3   No current facility-administered medications for this visit.      Allergies:   Cephalexin   Social History:  The patient  reports that he quit smoking about 17 years ago. His smoking use included Cigarettes. He has a 75.00 pack-year smoking history. He has never used smokeless tobacco. He reports that he does not drink alcohol or use drugs.   Family History:   family history includes Cancer in his maternal aunt; Heart disease in his mother.    Review of Systems: Review of  Systems  Constitutional: Negative.   Respiratory: Positive for shortness of breath.   Cardiovascular: Negative.   Gastrointestinal: Negative.   Musculoskeletal: Negative.   Neurological: Negative.   Psychiatric/Behavioral: Negative.   All other systems reviewed and are negative.    PHYSICAL EXAM: VS:  BP (!) 140/58 (BP Location: Left Arm, Patient Position: Sitting, Cuff Size: Normal)   Pulse (!) 58   Ht '5\' 8"'$  (1.727 m)   Wt 209 lb 12 oz (95.1 kg)   BMI 31.89 kg/m  , BMI Body mass index is 31.89 kg/m. GEN: Well nourished, well developed, in no acute distress  HEENT: normal  Neck: no JVD, carotid bruits,  or masses Cardiac: RRR; no murmurs, rubs, or gallops,no edema  Respiratory:  Mildly decreased breath sounds throughout,  normal work of breathing GI: soft, nontender, nondistended, + BS MS: no deformity or atrophy  Skin: warm and dry, no rash Neuro:  Strength and sensation are intact Psych: euthymic mood, full affect    Recent Labs: 05/20/2016: ALT 17; BUN 27; Creatinine, Ser 1.43; Potassium 4.7; Sodium 141    Lipid Panel Lab Results  Component Value Date   CHOL 168 11/17/2015   HDL 50.70 11/17/2015   LDLCALC 83 11/17/2015   TRIG 172.0 (H) 11/17/2015      Wt Readings from Last 3 Encounters:  09/06/16 209 lb 12 oz (95.1 kg)  08/26/16 209 lb 6.4 oz (95 kg)  08/05/16 208 lb 3.2 oz (94.4 kg)       ASSESSMENT AND PLAN:  Hypertension, unspecified type - Plan: EKG 12-Lead Blood pressure is well controlled on today's visit. No changes made to the medications.  Chronic obstructive pulmonary disease, unspecified COPD type ([place]) Managed by pulmonary, stable on his inhalers  Bradycardia Stable heart rate, asymptomatic  Pure hypercholesterolemia Cholesterol is at goal on the current lipid regimen. No changes to the medications were made.   Total encounter time more than 15 minutes  Greater than 50% was spent in counseling and coordination of care with the patient   Disposition:   F/U  6 months   Orders Placed This Encounter  Procedures  . EKG 12-Lead     Signed, Esmond Plants, M.D., Ph.D. 09/06/2016  [place], [place]

## 2016-09-11 ENCOUNTER — Telehealth: Payer: Self-pay | Admitting: Family Medicine

## 2016-09-11 DIAGNOSIS — M25562 Pain in left knee: Principal | ICD-10-CM

## 2016-09-11 DIAGNOSIS — G8929 Other chronic pain: Secondary | ICD-10-CM

## 2016-09-11 NOTE — Telephone Encounter (Signed)
Pt called and requested a referral for an orthopedic doctor in [place]. He c/o left leg pain still along with his knee and hip.  Call pt @ [phone]

## 2016-09-11 NOTE — Telephone Encounter (Signed)
You saw patient for this 08/26/16

## 2016-09-12 NOTE — Telephone Encounter (Signed)
Referral placed.

## 2016-09-24 ENCOUNTER — Telehealth: Payer: Self-pay | Admitting: *Deleted

## 2016-09-24 NOTE — Telephone Encounter (Signed)
Pt stated that he received a call in reference to a orthopedic referral. He requested a call to discuss this referral. Pt contact [phone]

## 2016-09-26 ENCOUNTER — Ambulatory Visit (INDEPENDENT_AMBULATORY_CARE_PROVIDER_SITE_OTHER): Payer: Medicare HMO | Admitting: Family Medicine

## 2016-09-26 DIAGNOSIS — M5416 Radiculopathy, lumbar region: Secondary | ICD-10-CM | POA: Diagnosis not present

## 2016-09-26 DIAGNOSIS — M25562 Pain in left knee: Secondary | ICD-10-CM

## 2016-09-26 NOTE — Patient Instructions (Signed)
Nice to see you. We will have you try ibuprofen as discussed. Please take this with food. Please proceed with the x-ray through the [place] on Monday. If your pain gets worse please seek medical attention.

## 2016-09-26 NOTE — Progress Notes (Signed)
  Tommi Rumps, MD Phone: [phone]  Donald [name] is a 82 y.o. male who presents today for follow-up.  Patient presents in follow-up for left leg pain. He was having pain in his hip, knee, and ankle previously. X-rays were obtained revealing degenerative changes in his knee and ankle. He notes the hip pain and knee pain have improved. Notes ankle still bothers him. Notes some tingling in the ankle. Hurts from his medial malleolus through to his lateral malleolus. Notes the more he walks more hurts. Is better when he sleeps in a recliner. He saw his physician at the [place] and they have ordered an x-ray of his low back to be obtained next week. They started him on gabapentin as well which has not been that beneficial. He has not been taking any ibuprofen. No numbness or weakness.   ROS see history of present illness  Objective  Physical Exam Vitals:   09/26/16 1603  BP: 128/62  Pulse: (!) 59  Temp: 98.2 F (36.8 C)    BP Readings from Last 3 Encounters:  09/26/16 128/62  09/06/16 (!) 140/58  08/26/16 134/62   Wt Readings from Last 3 Encounters:  09/26/16 209 lb 9.6 oz (95.1 kg)  09/06/16 209 lb 12 oz (95.1 kg)  08/26/16 209 lb 6.4 oz (95 kg)    Physical Exam  Constitutional: He is well-developed, well-nourished, and in no distress.  Musculoskeletal:  Left ankle with no swelling, there is tenderness of the medial and lateral malleolus, there is no bony defects, right ankle with no tenderness or swelling, bilateral knees with no tenderness or swelling, joint line tenderness, muscular tenderness, or ligamentous laxity, negative McMurray's, left hip with mild decreased range of motion on internal rotation, full external range of motion, no discomfort on range of motion internally or externally rotating     Assessment/Plan: Please see individual problem list.  Pain in joint involving left lower leg Has improved. Still some discomfort in his left ankle. Notes some tingling  in the ankle only. He has a scheduled x-ray of his low back. He will continue gabapentin. He will start on ibuprofen. He will take this with food. If he develops upset stomach he'll let us know. If he has worsening discomfort he will be evaluated again.   Tommi Rumps, MD [place]

## 2016-09-26 NOTE — Progress Notes (Signed)
Pre visit review using our clinic review tool, if applicable. No additional management support is needed unless otherwise documented below in the visit note. 

## 2016-09-26 NOTE — Assessment & Plan Note (Signed)
Has improved. Still some discomfort in his left ankle. Notes some tingling in the ankle only. He has a scheduled x-ray of his low back. He will continue gabapentin. He will start on ibuprofen. He will take this with food. If he develops upset stomach he'll let us know. If he has worsening discomfort he will be evaluated again.

## 2016-09-30 ENCOUNTER — Telehealth: Payer: Self-pay | Admitting: Family Medicine

## 2016-09-30 NOTE — Telephone Encounter (Signed)
Pt dropped off cd of his CT scan and results.Marland Kitchen wpuld like Dr. Caryl Bis to look over them and let him know what he thinks.. Placed in folder up front

## 2016-10-01 ENCOUNTER — Ambulatory Visit: Payer: Medicare HMO | Admitting: Pulmonary Disease

## 2016-10-01 ENCOUNTER — Ambulatory Visit: Payer: Commercial Managed Care - HMO

## 2016-10-01 NOTE — Telephone Encounter (Signed)
Disk put on your desk

## 2016-10-07 NOTE — Telephone Encounter (Signed)
Disc and report reviewed. Reveals degenerative changes and also reveals diffuse idiopathic skeletal hyperostosis. If patient has continued to have symptoms we could consider referral to orthopedics versus rheumatology given the findings of DISH. Please see if the patient has continued to have any symptoms.

## 2016-10-08 ENCOUNTER — Ambulatory Visit (INDEPENDENT_AMBULATORY_CARE_PROVIDER_SITE_OTHER): Payer: Medicare HMO

## 2016-10-08 VITALS — BP 126/66 | HR 60 | Temp 97.7°F | Resp 14 | Ht 68.0 in | Wt 210.2 lb

## 2016-10-08 DIAGNOSIS — Z0000 Encounter for general adult medical examination without abnormal findings: Secondary | ICD-10-CM

## 2016-10-08 NOTE — Telephone Encounter (Signed)
Notified patient of results during visit, verbalized understanding.  He is still having symptoms and has an appointment scheduled with ortho at the [place], [facility].  He plans to follow up with PCP once completed.

## 2016-10-08 NOTE — Patient Instructions (Addendum)
  Mr. Donald [name] , Thank you for taking time to come for your Medicare Wellness Visit. I appreciate your ongoing commitment to your health goals. Please review the following plan we discussed and let me know if I can assist you in the future.   These are the goals we discussed: Goals    . Healthy Lifestyle          Stay hydrated and drink plenty of fluids Stay active and continue walking for exercise Low carb foods     . Reduce sugar intake          Patient centered goal is to decrease sugar intake by cutting portions in half and/or switching to fudge pops to help with portion control.       This is a list of the screening recommended for you and due dates:  Health Maintenance  Topic Date Due  . Tetanus Vaccine  09/09/2021  . Flu Shot  Completed  . Shingles Vaccine  Addressed  . Pneumonia vaccines  Completed

## 2016-10-08 NOTE — Progress Notes (Signed)
Subjective:   Donald [name] is a 82 y.o. male who presents for Medicare Annual/Subsequent preventive examination.  Review of Systems:  No ROS.  Medicare Wellness Visit.  Cardiac Risk Factors include: advanced age (>69mn, >>85women);male gender;hypertension     Objective:    Vitals: BP 126/66 (BP Location: Left Arm, Patient Position: Sitting, Cuff Size: Normal)   Pulse 60   Temp 97.7 F (36.5 C) (Oral)   Resp 14   Ht '5\' 8"'$  (1.727 m)   Wt 210 lb 3.2 oz (95.3 kg)   SpO2 95%   BMI 31.96 kg/m   Body mass index is 31.96 kg/m.  Tobacco History  Smoking Status  . Former Smoker  . Packs/day: 1.50  . Years: 50.00  . Types: Cigarettes  . Quit date: 09/10/1999  Smokeless Tobacco  . Never Used     Counseling given: Not Answered   Past Medical History:  Diagnosis Date  . Arthritis   . Esophageal reflux   . Hyperlipidemia   . Hypertension   . Sebaceous cyst    Past Surgical History:  Procedure Laterality Date  . HEMORRHOID SURGERY     Family History  Problem Relation Age of Onset  . Heart disease Mother   . Cancer Maternal Aunt     lung   History  Sexual Activity  . Sexual activity: No    Outpatient Encounter Prescriptions as of 10/08/2016  Medication Sig  . amLODipine (NORVASC) 5 MG tablet Take 1 tablet (5 mg total) by mouth 2 (two) times daily.  .Marland Kitchenatorvastatin (LIPITOR) 40 MG tablet Take 1 tablet (40 mg total) by mouth daily.  . cholecalciferol (VITAMIN D) 1000 UNITS tablet Take 1,000 Units by mouth daily.  . citalopram (CELEXA) 20 MG tablet TAKE ONE (1) TABLET EACH DAY  . dutasteride (AVODART) 0.5 MG capsule Take 0.5 mg by mouth 2 (two) times a week.  . Multiple Vitamins-Minerals (MEGA MULTIVITAMIN FOR MEN) TABS Take 2 capsules by mouth daily.  .Marland Kitchenomeprazole (PRILOSEC) 20 MG capsule Take 1 capsule (20 mg total) by mouth daily.  . Tamsulosin HCl (FLOMAX) 0.4 MG CAPS Take 0.4 mg by mouth daily.  .Marland KitchenUmeclidinium Bromide (INCRUSE ELLIPTA) 62.5 MCG/INH AEPB  Inhale 1 puff into the lungs daily.  . valACYclovir (VALTREX) 500 MG tablet Take 1 tablet (500 mg total) by mouth every other day.   No facility-administered encounter medications on file as of 10/08/2016.     Activities of Daily Living In your present state of health, do you have any difficulty performing the following activities: 10/08/2016  Hearing? Y  Vision? N  Difficulty concentrating or making decisions? N  Walking or climbing stairs? N  Dressing or bathing? N  Doing errands, shopping? N  Preparing Food and eating ? N  Using the Toilet? N  In the past six months, have you accidently leaked urine? N  Do you have problems with loss of bowel control? N  Managing your Medications? N  Managing your Finances? N  Housekeeping or managing your Housekeeping? N  Some recent data might be hidden    Patient Care Team: ELeone Haven MD as PCP - General (Family Medicine) TMinna Merritts MD as Consulting Physician (Cardiology)   Assessment:    This is a routine wellness examination for Donald [name] The goal of the wellness visit is to assist the patient how to close the gaps in care and create a preventative care plan for the patient.   Taking calcium VIT D  as appropriate/Osteoporosis reviewed.  Medications reviewed; taking without issues or barriers.  Safety issues reviewed; lives with wife. Smoke detectors in the home. No firearms in the home. Wears seatbelts when driving or riding with others. No violence in the home.  No identified risk were noted; The patient was oriented x 3; appropriate in dress and manner and no objective failures at ADL's or IADL's.   BMI; discussed the importance of a healthy diet, water intake and exercise. Educational material provided.  HTN; controlled.  Stable and followed by PCP.  Health maintenance gaps; closed.  Patient Concerns: None at this time. Follow up with PCP as needed.  Exercise Activities and Dietary  recommendations Current Exercise Habits: Home exercise routine, Type of exercise: walking, Time (Minutes): 30, Frequency (Times/Week): 7, Weekly Exercise (Minutes/Week): 210, Intensity: Mild  Goals    . Healthy Lifestyle          Stay hydrated and drink plenty of fluids Stay active and continue walking for exercise Low carb foods     . Reduce sugar intake          Patient centered goal is to decrease sugar intake by cutting portions in half and/or switching to fudge pops to help with portion control.      Fall Risk Fall Risk  10/08/2016 05/20/2016 11/17/2015 10/02/2015 06/20/2014  Falls in the past year? No No No No No   Depression Screen PHQ 2/9 Scores 10/08/2016 05/20/2016 11/17/2015 10/02/2015  PHQ - 2 Score 0 0 0 0    Cognitive Function MMSE - Mini Mental State Exam 10/02/2015  Orientation to time 5  Orientation to Place 5  Registration 3  Attention/ Calculation 5  Recall 3  Language- name 2 objects 2  Language- repeat 1  Language- follow 3 step command 3  Language- read & follow direction 1  Write a sentence 1  Copy design 1  Total score 30     6CIT Screen 10/08/2016  What Year? 0 points  What month? 0 points  What time? 0 points  Count back from 20 0 points  Months in reverse 0 points  Repeat phrase 0 points  Total Score 0    Immunization History  Administered Date(s) Administered  . Influenza Split 08/05/2011, 08/19/2012, 07/29/2013  . Influenza, High Dose Seasonal PF 08/05/2016  . Influenza,inj,Quad PF,36+ Mos 07/20/2014  . Influenza-Unspecified 08/11/2015  . Pneumococcal Conjugate-13 03/18/2014  . Pneumococcal Polysaccharide-23 03/18/2008  . Tdap 09/10/2011   Screening Tests Health Maintenance  Topic Date Due  . TETANUS/TDAP  09/09/2021  . INFLUENZA VACCINE  Completed  . ZOSTAVAX  Addressed  . PNA vac Low Risk Adult  Completed      Plan:    End of life planning; Advance aging; Advanced directives discussed. Copy of current HCPOA/Living Will  requested.  Medicare Attestation I have personally reviewed: The patient's medical and social history Their use of alcohol, tobacco or illicit drugs Their current medications and supplements The patient's functional ability including ADLs,fall risks, home safety risks, cognitive, and hearing and visual impairment Diet and physical activities Evidence for depression   The patient's weight, height, BMI, and visual acuity have been recorded in the chart.  I have made referrals and provided education to the patient based on review of the above and I have provided the patient with a written personalized care plan for preventive services.    During the course of the visit the patient was educated and counseled about the following appropriate screening and preventive services:  Vaccines to include Pneumoccal, Influenza, Hepatitis B, Td, Zostavax, HCV  Electrocardiogram  Cardiovascular Disease  Colorectal cancer screening  Diabetes screening  Prostate Cancer Screening  Glaucoma screening  Nutrition counseling   Smoking cessation counseling  Patient Instructions (the written plan) was given to the patient.    Varney Biles, LPN  03/52/4818

## 2016-10-08 NOTE — Progress Notes (Signed)
Care was provided under my supervision. I agree with the management as indicated in the note.  Areana Kosanke DO  

## 2016-10-08 NOTE — Telephone Encounter (Signed)
Patient came in for appointment with Health Coach so she gave results to patient.

## 2016-10-14 ENCOUNTER — Ambulatory Visit (INDEPENDENT_AMBULATORY_CARE_PROVIDER_SITE_OTHER): Payer: Medicare HMO | Admitting: Pulmonary Disease

## 2016-10-14 ENCOUNTER — Encounter: Payer: Self-pay | Admitting: Pulmonary Disease

## 2016-10-14 VITALS — BP 132/72 | HR 60 | Ht 68.0 in | Wt 211.8 lb

## 2016-10-14 DIAGNOSIS — J449 Chronic obstructive pulmonary disease, unspecified: Secondary | ICD-10-CM | POA: Diagnosis not present

## 2016-10-14 MED ORDER — UMECLIDINIUM BROMIDE 62.5 MCG/INH IN AEPB: 1.0000 | INHALATION_SPRAY | Freq: Every day | RESPIRATORY_TRACT | 5 refills | Status: DC

## 2016-10-14 MED ORDER — ALBUTEROL SULFATE HFA 108 (90 BASE) MCG/ACT IN AERS: 1.0000 | INHALATION_SPRAY | RESPIRATORY_TRACT | 10 refills | Status: DC | PRN

## 2016-10-14 NOTE — Progress Notes (Signed)
PULMONARY OFFICE FOLLOW UP NOTE  Requesting MD/Service: self referred Date of initial consultation: 11/27/15 Reason for consultation: dyspnea, cough, former smoker, mild COPD  PT PROFILE: 82 y.o. M smoker of > 50 PYs previously seen by Dr Lake Bells and diagnosed with COPD. Self referred for eval of DOE and occasional cough productive of scant discolored mucus. PFTs 2014 revealed very mild obstruction  PROBLEMS: COPD - mild by PFTs 2014 Former smoker Class I dyspnea  INTERVAL HISTORY: No major events  SUBJ: Remains on Incruse. Overall no major changes in pulmonary/respiratory status but has noted increased DOE when ambulating in the cold morning air. Denies CP, fever, purulent sputum, hemoptysis, LE edema and calf tenderness.  OBJ: Vitals:   10/14/16 1009  BP: 132/72  Pulse: 60  SpO2: 96%  Weight: 211 lb 12.8 oz (96.1 kg)  Height: '5\' 8"'$  (1.727 m)   EXAM:  Gen: NAD HEENT: WNL Neck: No JVD Lungs: mildly diminished BS, no wheezes Cardiovascular: Reg, no murmurs Abdomen: NABS, soft Ext: without clubbing, cyanosis, edema Neuro: grossly intact  DATA: No new data  IMPRESSION: Mild to moderate COPD. Overall well compensated but with increased symptoms when exerting himself in the cold air. He has no rescue inhaler  PLAN: Cont Incruse - refill prescribed PRN albuterol ordered this visit ROV 6 months or PRN    Merton Border, MD PCCM service Mobile [phone] Pager [phone] 10/14/2016

## 2016-10-14 NOTE — Patient Instructions (Addendum)
-   Continue Incruse inhaler - I have refilled today  - Add albuterol rescue inhaler - 2 sprays as needed but you may use it in anticipation if you know you are going out to walk in the cold weather  - Merry Christmas, Happy Holidays  - Follow up in 6 months or as needed

## 2016-12-05 ENCOUNTER — Encounter: Payer: Self-pay | Admitting: Family Medicine

## 2016-12-05 ENCOUNTER — Ambulatory Visit (INDEPENDENT_AMBULATORY_CARE_PROVIDER_SITE_OTHER): Payer: Medicare HMO | Admitting: Family Medicine

## 2016-12-05 VITALS — BP 160/66 | HR 60 | Temp 97.8°F | Wt 216.4 lb

## 2016-12-05 DIAGNOSIS — I10 Essential (primary) hypertension: Secondary | ICD-10-CM | POA: Diagnosis not present

## 2016-12-05 DIAGNOSIS — E7800 Pure hypercholesterolemia, unspecified: Secondary | ICD-10-CM | POA: Diagnosis not present

## 2016-12-05 DIAGNOSIS — M25562 Pain in left knee: Secondary | ICD-10-CM

## 2016-12-05 DIAGNOSIS — R0989 Other specified symptoms and signs involving the circulatory and respiratory systems: Secondary | ICD-10-CM | POA: Diagnosis not present

## 2016-12-05 DIAGNOSIS — F4310 Post-traumatic stress disorder, unspecified: Secondary | ICD-10-CM | POA: Diagnosis not present

## 2016-12-05 NOTE — Assessment & Plan Note (Signed)
Above goal today though has been very well controlled at home. Was well controlled yesterday per his report at the [place]. We will have him monitor his blood pressure at home. He was given parameters to call with. He'll continue amlodipine. He notes he is due for lab work through the [place] next month and he will bring a copy to [name] when this is done.

## 2016-12-05 NOTE — Assessment & Plan Note (Signed)
Slight worsening of depression. He sees his psychiatrist in the next several weeks at the [place] and will discuss this further with them. Continue Celexa for now.

## 2016-12-05 NOTE — Progress Notes (Signed)
Pre visit review using our clinic review tool, if applicable. No additional management support is needed unless otherwise documented below in the visit note. 

## 2016-12-05 NOTE — Assessment & Plan Note (Signed)
Tolerating Lipitor. Possible claudication. Does have some decreased pedal pulses in his bilateral feet and thus we'll obtain ABIs to evaluate this. He will continue his Lipitor and have lab work as planned next month through the [place].

## 2016-12-05 NOTE — Patient Instructions (Signed)
Nice to see you. We'll get you set up for ABIs to evaluate your blood flow in your legs. Please keep your appointment for MRI. Please keep your appointment with psychiatrist in February. When you get blood work through the [place] please contact us.

## 2016-12-05 NOTE — Assessment & Plan Note (Signed)
Continues to have issues in his left ankle. Sounds as though it may be neuropathy particularly given the reported numbness in his plantar surfaces of his feet. He did not respond to gabapentin. Discussed that we may try Lyrica though we will obtain ABIs first to evaluate blood flow. He'll continue to monitor.

## 2016-12-05 NOTE — Progress Notes (Signed)
Tommi Rumps, MD Phone: [phone]  Donald [name] is a 83 y.o. male who presents today for follow-up.  Hypertension: Patient checks his blood pressure infrequently though notes it is typically 130-140/60's. Notes it was 120/56 yesterday at the [place]. Taking his amlodipine. No chest pain, shortness of breath. Has some swelling in his left ankle though this is been chronic and unchanged.  Hyperlipidemia: Taking Lipitor. Does note his legs do hurt somewhat when he walks. No right upper quadrant pain. No myalgias.  Continues to have issues with his left ankle. Describes it as a tingling sensation to start with and then a pain. His left hip and knee pain has resolved. Notes they tried gabapentin previously and he did titrate up to fairly high-dose though there is no benefit. He notes some numbness in his bilateral plantar surfaces of his feet as well. Notes he has an MRI of his lumbar spine scheduled for later today.  PTSD: Taking Celexa. Does note some mild worsening of depression over the last year. No SI or HI. Sees a psychiatrist at the [place] and sees them in February. Does not see a Social worker.  PMH: Former smoker   ROS see history of present illness  Objective  Physical Exam Vitals:   12/05/16 0802  BP: (!) 160/66  Pulse: 60  Temp: 97.8 F (36.6 C)    BP Readings from Last 3 Encounters:  12/05/16 (!) 160/66  10/14/16 132/72  10/08/16 126/66   Wt Readings from Last 3 Encounters:  12/05/16 216 lb 6.4 oz (98.2 kg)  10/14/16 211 lb 12.8 oz (96.1 kg)  10/08/16 210 lb 3.2 oz (95.3 kg)    Physical Exam  Constitutional: No distress.  Cardiovascular: Normal rate, regular rhythm and normal heart sounds.   2+ right DP pulse, decreased right PT pulse and left DP and PT pulses, bilateral feet are warm and well-perfused, sensation to light touch intact in bilateral feet  Pulmonary/Chest: Effort normal and breath sounds normal.  Musculoskeletal: He exhibits no edema.  Left ankle  nontender with no swelling, warmth, or erythema  Neurological: He is alert. Gait normal.  Skin: Skin is warm and dry. He is not diaphoretic.     Assessment/Plan: Please see individual problem list.  Hypertension Above goal today though has been very well controlled at home. Was well controlled yesterday per his report at the [place]. We will have him monitor his blood pressure at home. He was given parameters to call with. He'll continue amlodipine. He notes he is due for lab work through the [place] next month and he will bring a copy to [name] when this is done.  Hyperlipidemia Tolerating Lipitor. Possible claudication. Does have some decreased pedal pulses in his bilateral feet and thus we'll obtain ABIs to evaluate this. He will continue his Lipitor and have lab work as planned next month through the [place].  PTSD (post-traumatic stress disorder) Slight worsening of depression. He sees his psychiatrist in the next several weeks at the [place] and will discuss this further with them. Continue Celexa for now.  Pain in joint involving left lower leg Continues to have issues in his left ankle. Sounds as though it may be neuropathy particularly given the reported numbness in his plantar surfaces of his feet. He did not respond to gabapentin. Discussed that we may try Lyrica though we will obtain ABIs first to evaluate blood flow. He'll continue to monitor.   Orders Placed This Encounter  Procedures  . [name] ABI W/WO TBI  Standing Status:   Future    Standing Expiration Date:   02/02/2018    Order Specific Question:   Reason for Exam (SYMPTOM  OR DIAGNOSIS REQUIRED)    Answer:   possible claudication, decreased left foot pulses    Order Specific Question:   Preferred imaging location?    Answer:   [facility]   Tommi Rumps, MD [place]

## 2017-01-13 DIAGNOSIS — M9903 Segmental and somatic dysfunction of lumbar region: Secondary | ICD-10-CM | POA: Diagnosis not present

## 2017-01-13 DIAGNOSIS — M5442 Lumbago with sciatica, left side: Secondary | ICD-10-CM | POA: Diagnosis not present

## 2017-01-13 DIAGNOSIS — M9904 Segmental and somatic dysfunction of sacral region: Secondary | ICD-10-CM | POA: Diagnosis not present

## 2017-01-13 DIAGNOSIS — M461 Sacroiliitis, not elsewhere classified: Secondary | ICD-10-CM | POA: Diagnosis not present

## 2017-01-17 DIAGNOSIS — M9903 Segmental and somatic dysfunction of lumbar region: Secondary | ICD-10-CM | POA: Diagnosis not present

## 2017-01-17 DIAGNOSIS — M5442 Lumbago with sciatica, left side: Secondary | ICD-10-CM | POA: Diagnosis not present

## 2017-01-17 DIAGNOSIS — M461 Sacroiliitis, not elsewhere classified: Secondary | ICD-10-CM | POA: Diagnosis not present

## 2017-01-17 DIAGNOSIS — M9904 Segmental and somatic dysfunction of sacral region: Secondary | ICD-10-CM | POA: Diagnosis not present

## 2017-03-05 ENCOUNTER — Ambulatory Visit (INDEPENDENT_AMBULATORY_CARE_PROVIDER_SITE_OTHER): Payer: Medicare HMO | Admitting: Family Medicine

## 2017-03-05 ENCOUNTER — Encounter: Payer: Self-pay | Admitting: Family Medicine

## 2017-03-05 DIAGNOSIS — J449 Chronic obstructive pulmonary disease, unspecified: Secondary | ICD-10-CM | POA: Diagnosis not present

## 2017-03-05 DIAGNOSIS — G8929 Other chronic pain: Secondary | ICD-10-CM

## 2017-03-05 DIAGNOSIS — I10 Essential (primary) hypertension: Secondary | ICD-10-CM

## 2017-03-05 DIAGNOSIS — M549 Dorsalgia, unspecified: Secondary | ICD-10-CM

## 2017-03-05 DIAGNOSIS — E7800 Pure hypercholesterolemia, unspecified: Secondary | ICD-10-CM

## 2017-03-05 DIAGNOSIS — M545 Low back pain: Secondary | ICD-10-CM | POA: Diagnosis not present

## 2017-03-05 NOTE — Progress Notes (Signed)
  Donald Rumps, MD Phone: [phone]  Donald [name] is a 83 y.o. male who presents today for follow-up.  HYPERTENSION  Disease Monitoring  Home BP Monitoring 135-140/60's Chest pain- no    Dyspnea- no Medications  Compliance-  Taking amlodipine, reports he is taking an additional medication through the [place] though he can't remember the name.  Edema- no  HYPERLIPIDEMIA Symptoms Chest pain on exertion:  no   Leg claudication:   no Medications: Compliance- taking lipitor Right upper quadrant pain- no  Muscle aches- no Patient reports he had ABIs done through the [place] and states they turned out okay.  The pain he was having in his legs with walking has gone away after the chiropractor has worked on his back. Does note some low back discomfort that is chronic that typically only bothers him when he walks. He notes no radiation down his legs. No numbness or weakness. No loss of bowel or bladder function. No saddle anesthesia. He takes Motrin occasionally for his back. Stops bothering him if he sits down. He sees the [place] for his back pain.  COPD: Taking Spiriva. Has not had to use his albuterol in the last month. Does note some minimal cough that is unchanged from previously. No production. No wheezing.  PMH: Former smoker   ROS see history of present illness  Objective  Physical Exam Vitals:   03/05/17 0846  BP: (!) 146/62  Pulse: (!) 57  Temp: 98.2 F (36.8 C)    BP Readings from Last 3 Encounters:  03/05/17 (!) 146/62  12/05/16 (!) 160/66  10/14/16 132/72   Wt Readings from Last 3 Encounters:  03/05/17 217 lb 9.6 oz (98.7 kg)  12/05/16 216 lb 6.4 oz (98.2 kg)  10/14/16 211 lb 12.8 oz (96.1 kg)    Physical Exam  Constitutional: No distress.  Cardiovascular: Normal rate, regular rhythm and normal heart sounds.   Pulmonary/Chest: Effort normal and breath sounds normal.  Musculoskeletal: He exhibits no edema.  No midline spine tenderness, no midline spine step-off,  no muscular back tenderness  Neurological: He is alert. Gait normal.  5 out of 5 strength bilateral quads, hamstrings, plantar flexion, and dorsiflexion, sensation light touch intact in bilateral lower extremities  Skin: Skin is warm and dry. He is not diaphoretic.     Assessment/Plan: Please see individual problem list.  COPD (chronic obstructive pulmonary disease) (HCC) Stable. Continue current medications.  Hypertension Well-controlled for age. He'll continue his current medications. He'll contact us to let us know what medication he is taking to the [place].  Hyperlipidemia Tolerating Lipitor. He'll continue this. Due for lab work through the [place] in the next 1-2 months. He'll have them fax it to [name].  Chronic back pain Patient with chronic low back pain. Followed by pain clinic at the [facility]. The pain that was moving down his legs has improved and potentially could've been nerve impingement or related to some kind of stenosis given that it was bothering him with walking. He had evaluation for claudication per his report through the [place] with negative ABIs. We'll request those records. [facility] see if [facility] would be beneficial.   Donald Rumps, MD [place]

## 2017-03-05 NOTE — Assessment & Plan Note (Signed)
Patient with chronic low back pain. Followed by pain clinic at the [facility]. The pain that was moving down his legs has improved and potentially could've been nerve impingement or related to some kind of stenosis given that it was bothering him with walking. He had evaluation for claudication per his report through the [place] with negative ABIs. We'll request those records. Encouraged [facility] to [facility] to see if injections would be beneficial.

## 2017-03-05 NOTE — Progress Notes (Signed)
Pre visit review using our clinic review tool, if applicable. No additional management support is needed unless otherwise documented below in the visit note. 

## 2017-03-05 NOTE — Patient Instructions (Signed)
Nice to see you. We will request records from the [place]. Please continue to monitor blood pressure.

## 2017-03-05 NOTE — Assessment & Plan Note (Signed)
Tolerating Lipitor. He'll continue this. Due for lab work through the [place] in the next 1-2 months. He'll have them fax it to [name].

## 2017-03-05 NOTE — Assessment & Plan Note (Signed)
Well-controlled for age. He'll continue his current medications. He'll contact us to let us know what medication he is taking to the [place].

## 2017-03-05 NOTE — Assessment & Plan Note (Signed)
Stable.  Continue current medications.

## 2017-04-25 ENCOUNTER — Other Ambulatory Visit: Payer: Self-pay | Admitting: Family Medicine

## 2017-04-25 DIAGNOSIS — M545 Low back pain: Principal | ICD-10-CM

## 2017-04-25 DIAGNOSIS — G8929 Other chronic pain: Secondary | ICD-10-CM

## 2017-05-02 ENCOUNTER — Telehealth: Payer: Self-pay | Admitting: Family Medicine

## 2017-05-08 ENCOUNTER — Other Ambulatory Visit: Payer: Self-pay | Admitting: *Deleted

## 2017-05-08 ENCOUNTER — Encounter: Payer: Self-pay | Admitting: *Deleted

## 2017-05-09 ENCOUNTER — Encounter: Payer: Self-pay | Admitting: Pulmonary Disease

## 2017-05-09 ENCOUNTER — Ambulatory Visit (INDEPENDENT_AMBULATORY_CARE_PROVIDER_SITE_OTHER): Payer: Medicare HMO | Admitting: Pulmonary Disease

## 2017-05-09 VITALS — BP 160/60 | HR 70 | Resp 16 | Ht 68.0 in | Wt 219.0 lb

## 2017-05-09 DIAGNOSIS — J449 Chronic obstructive pulmonary disease, unspecified: Secondary | ICD-10-CM | POA: Diagnosis not present

## 2017-05-09 NOTE — Patient Instructions (Signed)
Continue Spiriva Continue albuterol as needed Follow-up in 6 months or sooner as needed

## 2017-05-11 NOTE — Progress Notes (Signed)
PULMONARY OFFICE FOLLOW UP NOTE  Requesting MD/Service: self referred Date of initial consultation: 11/27/15 Reason for consultation: dyspnea, cough, former smoker, mild COPD  PT PROFILE: 83 y.o. M smoker of > 50 PYs previously seen by Dr Lake Bells and diagnosed with COPD. Self referred for eval of DOE and occasional cough productive of scant discolored mucus. PFTs 2014 revealed very mild obstruction  PROBLEMS: COPD - mild by PFTs 2014 Former smoker Class I dyspnea  INTERVAL HISTORY: No major events since last visit 10/14/16  SUBJ: No new complaints. Incruse changed to Spiriva by [facility]. Rarely uses albuterol. Limited more by back pain than dyspnea. Denies CP, fever, purulent sputum, hemoptysis, LE edema and calf tenderness.  OBJ: Vitals:   05/09/17 0944  BP: (!) 160/60  Pulse: 70  Resp: 16  SpO2: 91%  Weight: 219 lb (99.3 kg)  Height: 5\' 8"  (1.727 m)   EXAM:  Gen: NAD HEENT: WNL Neck: No JVD Lungs: mildly diminished BS, no wheezes Cardiovascular: Reg, no murmurs Abdomen: NABS, soft Ext: without clubbing, cyanosis, edema Neuro: grossly intact  DATA: No new data  IMPRESSION: Mild COPD. Overall well compensated  PLAN: Continue Spiriva Continue albuterol as needed Follow-up in 6 months or sooner as needed   Merton Border, MD PCCM service Mobile [phone] Pager [phone] 05/11/2017

## 2017-05-20 DIAGNOSIS — N3941 Urge incontinence: Secondary | ICD-10-CM | POA: Diagnosis not present

## 2017-05-20 DIAGNOSIS — N411 Chronic prostatitis: Secondary | ICD-10-CM | POA: Diagnosis not present

## 2017-05-20 DIAGNOSIS — R972 Elevated prostate specific antigen [PSA]: Secondary | ICD-10-CM | POA: Diagnosis not present

## 2017-05-20 DIAGNOSIS — N403 Nodular prostate with lower urinary tract symptoms: Secondary | ICD-10-CM | POA: Diagnosis not present

## 2017-05-20 DIAGNOSIS — N138 Other obstructive and reflux uropathy: Secondary | ICD-10-CM | POA: Diagnosis not present

## 2017-05-20 DIAGNOSIS — R339 Retention of urine, unspecified: Secondary | ICD-10-CM | POA: Diagnosis not present

## 2017-06-04 ENCOUNTER — Encounter: Payer: Self-pay | Admitting: Family Medicine

## 2017-06-04 ENCOUNTER — Ambulatory Visit (INDEPENDENT_AMBULATORY_CARE_PROVIDER_SITE_OTHER): Payer: Medicare HMO | Admitting: Family Medicine

## 2017-06-04 VITALS — BP 146/64 | HR 64 | Temp 98.6°F | Wt 221.2 lb

## 2017-06-04 DIAGNOSIS — R1314 Dysphagia, pharyngoesophageal phase: Secondary | ICD-10-CM

## 2017-06-04 DIAGNOSIS — G8929 Other chronic pain: Secondary | ICD-10-CM | POA: Diagnosis not present

## 2017-06-04 DIAGNOSIS — J449 Chronic obstructive pulmonary disease, unspecified: Secondary | ICD-10-CM

## 2017-06-04 DIAGNOSIS — M5442 Lumbago with sciatica, left side: Secondary | ICD-10-CM

## 2017-06-04 DIAGNOSIS — I10 Essential (primary) hypertension: Secondary | ICD-10-CM | POA: Diagnosis not present

## 2017-06-04 DIAGNOSIS — R1310 Dysphagia, unspecified: Secondary | ICD-10-CM | POA: Diagnosis not present

## 2017-06-04 NOTE — Assessment & Plan Note (Signed)
Chronic issue. Stable. He is awaiting to find out if he can be seen at [facility] clinic by physical medicine and rehabilitation. We will check into this for him.

## 2017-06-04 NOTE — Progress Notes (Signed)
  Tommi Rumps, MD Phone: [phone]  Donald [name] DOBIE is a 83 y.o. male who presents today for follow-up.  Hypertension: 140/55-60. Taking amlodipine. No chest pain. Notes stable breathing. Notes stable minimal edema bilateral ankles. No orthopnea or PND. Reports had lab work recently through the [place].  COPD: Patient has been on Spiriva. Seldomly uses his Combivent. No wheezing. Get somewhat short of breath when he exerts himself though this is similar to previous and has been stable since going back on Spiriva. He feels his back and leg pain limits him more on exertion than his breathing.  Low back and left leg pain: Has had an MRI. Notes chronic low back pain that does radiate down his left leg at times. Has been followed at the [facility] for this. Takes ibuprofen and Tylenol at times. He is awaiting referral to physical medicine and rehabilitation. No saddle anesthesia or bowel or bladder incontinence. No numbness or weakness.  GERD: Taking Prilosec. No abdominal pain, reflux symptoms, or blood in his stool. Does feel like he gets choked up at times. He left the cough when this occurs. Reports he had an evaluation where they directly looked at his throat 4-5 years ago. He notes this was normal.  PMH: Former smoker   ROS see history of present illness  Objective  Physical Exam Vitals:   06/04/17 0845  BP: (!) 146/64  Pulse: 64  Temp: 98.6 F (37 C)    BP Readings from Last 3 Encounters:  06/04/17 (!) 146/64  05/09/17 (!) 160/60  03/05/17 (!) 146/62   Wt Readings from Last 3 Encounters:  06/04/17 221 lb 3.2 oz (100.3 kg)  05/09/17 219 lb (99.3 kg)  03/05/17 217 lb 9.6 oz (98.7 kg)    Physical Exam  Constitutional: No distress.  Cardiovascular: Normal rate, regular rhythm and normal heart sounds.   Pulmonary/Chest: Effort normal and breath sounds normal.  Abdominal: Soft. Bowel sounds are normal. He exhibits no distension. There is no tenderness. There is no rebound and no  guarding.  Musculoskeletal:  No midline spine tenderness, no midline spine step-off, no muscular back tenderness  Neurological: He is alert. Gait normal.  5/5 strength bilateral quads, hamstrings, plantar flexion, and dorsiflexion, sensation to light touch intact bilaterally extremities  Skin: Skin is warm and dry. He is not diaphoretic.     Assessment/Plan: Please see individual problem list.  Chronic obstructive pulmonary disease (HCC) Stable. Continue to follow with pulmonology. Continue current inhalers.  Essential hypertension At goal for age. Continue current medication.  Dysphagia, pharyngoesophageal phase Continues to have issues with this. He had prior evaluation which he reports is negative. It appears this was a GI evaluation. Slight hiatal hernia on barium swallow and slight reflux. We'll refer to ENT for direct visualization of his throat. Advised to return precautions.  Chronic back pain Chronic issue. Stable. He is awaiting to find out if he can be seen at [facility] clinic by physical medicine and rehabilitation. We will check into this for him.   Orders Placed This Encounter  Procedures  . Ambulatory referral to ENT    Referral Priority:   Routine    Referral Type:   Consultation    Referral Reason:   Specialty Services Required    Requested Specialty:   Otolaryngology    Number of Visits Requested:   [place], MD [place]

## 2017-06-04 NOTE — Patient Instructions (Signed)
Nice to see you. We'll get you to see ENT for trouble swallowing. If you have persistent trouble with this please be evaluated immediately.  Please monitor your breathing and if it worsens or you develop chest pain please be evaluated as well.

## 2017-06-04 NOTE — Assessment & Plan Note (Signed)
At goal for age. Continue current medication.

## 2017-06-04 NOTE — Assessment & Plan Note (Signed)
Stable. Continue to follow with pulmonology. Continue current inhalers.

## 2017-06-04 NOTE — Assessment & Plan Note (Signed)
Continues to have issues with this. He had prior evaluation which he reports is negative. It appears this was a GI evaluation. Slight hiatal hernia on barium swallow and slight reflux. We'll refer to ENT for direct visualization of his throat. Advised to return precautions.

## 2017-06-23 DIAGNOSIS — K219 Gastro-esophageal reflux disease without esophagitis: Secondary | ICD-10-CM | POA: Diagnosis not present

## 2017-06-23 DIAGNOSIS — R1314 Dysphagia, pharyngoesophageal phase: Secondary | ICD-10-CM | POA: Diagnosis not present

## 2017-06-24 ENCOUNTER — Other Ambulatory Visit: Payer: Self-pay | Admitting: Otolaryngology

## 2017-06-24 DIAGNOSIS — R1310 Dysphagia, unspecified: Secondary | ICD-10-CM

## 2017-06-30 ENCOUNTER — Telehealth: Payer: Self-pay | Admitting: *Deleted

## 2017-06-30 NOTE — Telephone Encounter (Signed)
The medication is Buloxetinehcl 25mg  capsule.

## 2017-06-30 NOTE — Telephone Encounter (Signed)
Please advise 

## 2017-06-30 NOTE — Telephone Encounter (Signed)
Please see if it was duloxetine. Please see why they switch this and how it is not helping. Thanks.

## 2017-06-30 NOTE — Telephone Encounter (Signed)
Patient states he was taken off of citalopram and given a different medication which is not helping. Patient will call back with the name of the med he was given.

## 2017-06-30 NOTE — Telephone Encounter (Signed)
Pt has a question in regards to a medication -the wife called with this request and was not sure of the medication he had questions about  Pt contact [phone]

## 2017-07-01 NOTE — Telephone Encounter (Signed)
Patient states he is on duloxetine, he states with his ptsd his temper and moods are not improving. Patient states the celexa was no longer helping and they informed him that he was already on the maximum dose. He has been on Cymbalta 6-8 weeks

## 2017-07-01 NOTE — Telephone Encounter (Signed)
Please find out what other medications he has tried for PTSD. Please see if he has contacted the [place] regarding the Cymbalta. If he has please see what their plan was. Thanks.

## 2017-07-02 NOTE — Telephone Encounter (Signed)
Patient states he has not tried anything else, he was on citalopram for 20 years. He states the [place] informed him to give it more time and he does not have a follow up with them until December.

## 2017-07-03 NOTE — Telephone Encounter (Signed)
Please check to see if he would like to go back on the Celexa if he feels it was more beneficial. If he does not feel it was beneficial and he does not feel the Cymbalta has been beneficial and he does not want to give it any more time we could consider an alternative medication to these 2 medications. Thanks.

## 2017-07-03 NOTE — Telephone Encounter (Signed)
Patient states it did work better then duloxetine but it was not as effective any longer.

## 2017-07-04 NOTE — Telephone Encounter (Signed)
error 

## 2017-07-04 NOTE — Telephone Encounter (Signed)
Patient would like to try a new medication

## 2017-07-04 NOTE — Telephone Encounter (Signed)
Please see what the patient wants to do regarding my prior message. He could continue on the Cymbalta and see if it will be beneficial down the line or we could try another medication.

## 2017-07-06 NOTE — Telephone Encounter (Signed)
I am willing to try him on a new medication. Please confirm the dose of the Cymbalta. I do not believe there is a 25 mg dose. I will need to know what the dose is to consider tapering him off and then getting him on another medication. Thanks.

## 2017-07-07 MED ORDER — SERTRALINE HCL 50 MG PO TABS: 50.0000 mg | ORAL_TABLET | Freq: Every day | ORAL | 3 refills | Status: DC

## 2017-07-07 NOTE — Telephone Encounter (Signed)
Patient states the cymbalta was 20 mg, patient has been taking Cymbalta 6 weeks

## 2017-07-07 NOTE — Telephone Encounter (Signed)
Patient can discontinue the Cymbalta. I will send in Zoloft for him to start on. He should follow up in about a month. Thanks.

## 2017-07-08 NOTE — Telephone Encounter (Signed)
Patient notified and scheduled 

## 2017-07-10 ENCOUNTER — Ambulatory Visit: Admission: RE | Admit: 2017-07-10 | Payer: Medicare HMO | Source: Ambulatory Visit

## 2017-08-04 ENCOUNTER — Ambulatory Visit: Payer: Medicare HMO | Admitting: Family Medicine

## 2017-08-04 DIAGNOSIS — Z0289 Encounter for other administrative examinations: Secondary | ICD-10-CM

## 2017-08-05 DIAGNOSIS — M5126 Other intervertebral disc displacement, lumbar region: Secondary | ICD-10-CM | POA: Diagnosis not present

## 2017-08-05 DIAGNOSIS — M47816 Spondylosis without myelopathy or radiculopathy, lumbar region: Secondary | ICD-10-CM | POA: Diagnosis not present

## 2017-08-05 DIAGNOSIS — M5416 Radiculopathy, lumbar region: Secondary | ICD-10-CM | POA: Diagnosis not present

## 2017-08-05 DIAGNOSIS — M48062 Spinal stenosis, lumbar region with neurogenic claudication: Secondary | ICD-10-CM | POA: Diagnosis not present

## 2017-08-11 DIAGNOSIS — M5126 Other intervertebral disc displacement, lumbar region: Secondary | ICD-10-CM | POA: Diagnosis not present

## 2017-08-11 DIAGNOSIS — M48062 Spinal stenosis, lumbar region with neurogenic claudication: Secondary | ICD-10-CM | POA: Diagnosis not present

## 2017-08-11 DIAGNOSIS — M5416 Radiculopathy, lumbar region: Secondary | ICD-10-CM | POA: Diagnosis not present

## 2017-09-05 ENCOUNTER — Ambulatory Visit: Payer: Medicare HMO | Admitting: Family Medicine

## 2017-09-07 NOTE — Progress Notes (Signed)
Cardiology Office Note  Date:  09/08/2017   ID:  DENALI BECVAR, DOB 1932/12/27, MRN [id number]  PCP:  [name], MD   Chief Complaint  Patient presents with  . other    12 month follow up. Meds reviewed by the pt. verbally. "doing well."     HPI:  83 Yo male with h/o  hypertension,  hyperlipidemia,  50 years of smoking who stopped 14 years ago,  previously presenting for shortness of breath,  Frances [place] some of his medications from [facility] Asymptomatic bradycardia, occasional PVCs Previous stress test 10/ 2014 no ischemia presents for routine followup of his bradycardia, Shortness of breath  In follow-up today he reports that he feels well Denies any chest pain or claudication type symptoms Tolerating his cholesterol medication  Chronic mild shortness of breath, on inhalers No regular exercise program Prior history of chronic Left leg pain,  Feels like a nerve pain Denies any chest pain concerning for angina  Labs reviewed with him No recent cholesterol lab work since January 2017 Total chol 168, LDL 83  EKG on today's visit shows normal sinus rhythm with rate 59 bpm, right bundle branch block No change from previous EKG  Other past medical history Continues to take Lipitor 40 mg daily  Previous heart rate of 32 when he saw Dr. Lake Bells in pulmonary. At the time he was having bigeminal PVCs.  He is asymptomatic from his ectopy.  Reports having PFTs in the past which showed "COPD" per the notes. Prior stress test many years ago.  PMH:   has a past medical history of Arthritis; Esophageal reflux; Hyperlipidemia; Hypertension; and Sebaceous cyst.  PSH:    Past Surgical History:  Procedure Laterality Date  . HEMORRHOID SURGERY      Current Outpatient Prescriptions  Medication Sig Dispense Refill  . albuterol (PROVENTIL HFA;VENTOLIN HFA) 108 (90 Base) MCG/ACT inhaler Inhale 1-2 puffs into the lungs every 4 (four) hours as needed for wheezing or  shortness of breath. 1 Inhaler 10  . amLODipine (NORVASC) 5 MG tablet Take 1 tablet (5 mg total) by mouth 2 (two) times daily. 180 tablet 1  . atorvastatin (LIPITOR) 40 MG tablet Take 1 tablet (40 mg total) by mouth daily. 30 tablet 12  . cholecalciferol (VITAMIN D) 1000 UNITS tablet Take 1,000 Units by mouth daily.    Marland Kitchen dutasteride (AVODART) 0.5 MG capsule Take 0.5 mg by mouth 2 (two) times a week.    . Multiple Vitamins-Minerals (MEGA MULTIVITAMIN FOR MEN) TABS Take 2 capsules by mouth daily.    Marland Kitchen omeprazole (PRILOSEC) 20 MG capsule Take 1 capsule (20 mg total) by mouth daily. 30 capsule 6  . sertraline (ZOLOFT) 50 MG tablet Take 1 tablet (50 mg total) by mouth daily. 30 tablet 3  . Tamsulosin HCl (FLOMAX) 0.4 MG CAPS Take 0.4 mg by mouth daily.    Marland Kitchen tiotropium (SPIRIVA) 18 MCG inhalation capsule Place 18 mcg into inhaler and inhale daily.    . valACYclovir (VALTREX) 500 MG tablet Take 1 tablet (500 mg total) by mouth every other day. 15 tablet 3   No current facility-administered medications for this visit.      Allergies:   Cephalexin   Social History:  The patient  reports that he quit smoking about 18 years ago. His smoking use included Cigarettes. He has a 75.00 pack-year smoking history. He has never used smokeless tobacco. He reports that he does not drink alcohol or use drugs.   Family  History:   family history includes Cancer in his maternal aunt; Heart disease in his mother.    Review of Systems: Review of Systems  Constitutional: Negative.   Respiratory: Positive for shortness of breath.   Cardiovascular: Negative.   Gastrointestinal: Negative.   Musculoskeletal: Positive for back pain and joint pain.  Neurological: Negative.   Psychiatric/Behavioral: Negative.   All other systems reviewed and are negative. No new findings on review of systems   PHYSICAL EXAM: VS:  BP (!) 150/60 (BP Location: Left Arm, Patient Position: Sitting, Cuff Size: Normal)   Pulse (!) 59    Ht 5\' 8"  (1.727 m)   Wt 218 lb 8 oz (99.1 kg)   BMI 33.22 kg/m  , BMI Body mass index is 33.22 kg/m.  No change from previous exam GEN: Well nourished, well developed, in no acute distress , obese HEENT: normal  Neck: no JVD, carotid bruits, or masses Cardiac: RRR; no murmurs, rubs, or gallops,no edema  Respiratory:  Mildly decreased breath sounds throughout,  normal work of breathing GI: soft, nontender, nondistended, + BS MS: no deformity or atrophy  Skin: warm and dry, no rash Neuro:  Strength and sensation are intact Psych: euthymic mood, full affect    Recent Labs: No results found for requested labs within last 8760 hours.    Lipid Panel Lab Results  Component Value Date   CHOL 168 11/17/2015   HDL 50.70 11/17/2015   LDLCALC 83 11/17/2015   TRIG 172.0 (H) 11/17/2015      Wt Readings from Last 3 Encounters:  09/08/17 218 lb 8 oz (99.1 kg)  06/04/17 221 lb 3.2 oz (100.3 kg)  05/09/17 219 lb (99.3 kg)       ASSESSMENT AND PLAN:  Hypertension, unspecified type - Plan: EKG 12-Lead Blood pressure is well controlled on today's visit after repeat by myself.  Initial systolic was elevated, improved on recheck.  no changes made to the medications.  Chronic obstructive pulmonary disease, unspecified COPD type ([place]) Managed by pulmonary, stable on his inhalers Recommended regular walking program for weight loss and conditioning  Bradycardia Asymptomatic, rate is adequate on today's visit  Pure hypercholesterolemia Recommended he stay on his statin, repeat lab work with primary care and follow-up   Total encounter time more than 15 minutes  Greater than 50% was spent in counseling and coordination of care with the patient   Disposition:   F/U  12 months   No orders of the defined types were placed in this encounter.    Signed, Esmond Plants, M.D., Ph.D. 09/08/2017  [place], [place]

## 2017-09-08 ENCOUNTER — Encounter: Payer: Self-pay | Admitting: Cardiovascular Disease

## 2017-09-08 ENCOUNTER — Ambulatory Visit (INDEPENDENT_AMBULATORY_CARE_PROVIDER_SITE_OTHER): Payer: Medicare HMO | Admitting: Cardiovascular Disease

## 2017-09-08 VITALS — BP 140/60 | HR 59 | Ht 68.0 in | Wt 218.5 lb

## 2017-09-08 DIAGNOSIS — J449 Chronic obstructive pulmonary disease, unspecified: Secondary | ICD-10-CM

## 2017-09-08 DIAGNOSIS — E7849 Other hyperlipidemia: Secondary | ICD-10-CM | POA: Diagnosis not present

## 2017-09-08 DIAGNOSIS — Z87891 Personal history of nicotine dependence: Secondary | ICD-10-CM | POA: Diagnosis not present

## 2017-09-08 DIAGNOSIS — I10 Essential (primary) hypertension: Secondary | ICD-10-CM | POA: Diagnosis not present

## 2017-09-08 NOTE — Patient Instructions (Addendum)

## 2017-09-10 DIAGNOSIS — M5416 Radiculopathy, lumbar region: Secondary | ICD-10-CM | POA: Diagnosis not present

## 2017-09-10 DIAGNOSIS — M5126 Other intervertebral disc displacement, lumbar region: Secondary | ICD-10-CM | POA: Diagnosis not present

## 2017-09-10 DIAGNOSIS — M48062 Spinal stenosis, lumbar region with neurogenic claudication: Secondary | ICD-10-CM | POA: Diagnosis not present

## 2017-10-08 ENCOUNTER — Ambulatory Visit (INDEPENDENT_AMBULATORY_CARE_PROVIDER_SITE_OTHER): Payer: Medicare HMO

## 2017-10-08 VITALS — BP 140/64 | HR 73 | Temp 97.9°F | Resp 16 | Ht 68.0 in | Wt 217.8 lb

## 2017-10-08 DIAGNOSIS — Z0000 Encounter for general adult medical examination without abnormal findings: Secondary | ICD-10-CM

## 2017-10-08 NOTE — Progress Notes (Signed)
Subjective:   Donald [name] is a 83 y.o. male who presents for Medicare Annual/Subsequent preventive examination.  Review of Systems:  No ROS.  Medicare Wellness Visit. Additional risk factors are reflected in the social history.  Cardiac Risk Factors include: advanced age (>72men, >97 women);hypertension;obesity (BMI >30kg/m2)     Objective:    Vitals: BP 140/64 (BP Location: Left Arm, Patient Position: Sitting, Cuff Size: Normal)   Pulse 73   Temp 97.9 F (36.6 C)   Resp 16   Ht 5\' 8"  (1.727 m)   Wt 217 lb 12.8 oz (98.8 kg)   SpO2 94%   BMI 33.12 kg/m   Body mass index is 33.12 kg/m.  Tobacco Social History   Tobacco Use  Smoking Status Former Smoker  . Packs/day: 1.50  . Years: 50.00  . Pack years: 75.00  . Types: Cigarettes  . Last attempt to quit: 09/10/1999  . Years since quitting: 18.0  Smokeless Tobacco Never Used     Counseling given: Not Answered   Past Medical History:  Diagnosis Date  . Arthritis   . Esophageal reflux   . Hyperlipidemia   . Hypertension   . Sebaceous cyst    Past Surgical History:  Procedure Laterality Date  . HEMORRHOID SURGERY     Family History  Problem Relation Age of Onset  . Heart disease Mother   . Cancer Maternal Aunt        lung   Social History   Substance and Sexual Activity  Sexual Activity No    Outpatient Encounter Medications as of 10/08/2017  Medication Sig  . albuterol (PROVENTIL HFA;VENTOLIN HFA) 108 (90 Base) MCG/ACT inhaler Inhale 1-2 puffs into the lungs every 4 (four) hours as needed for wheezing or shortness of breath.  Marland Kitchen amLODipine (NORVASC) 5 MG tablet Take 1 tablet (5 mg total) by mouth 2 (two) times daily.  . cholecalciferol (VITAMIN D) 1000 UNITS tablet Take 1,000 Units by mouth daily.  Marland Kitchen dutasteride (AVODART) 0.5 MG capsule Take 0.5 mg by mouth 2 (two) times a week.  . Multiple Vitamins-Minerals (MEGA MULTIVITAMIN FOR MEN) TABS Take 2 capsules by mouth daily.  Marland Kitchen omeprazole  (PRILOSEC) 20 MG capsule Take 1 capsule (20 mg total) by mouth daily.  . sertraline (ZOLOFT) 50 MG tablet Take 1 tablet (50 mg total) by mouth daily.  . Tamsulosin HCl (FLOMAX) 0.4 MG CAPS Take 0.4 mg by mouth daily.  Marland Kitchen tiotropium (SPIRIVA) 18 MCG inhalation capsule Place 18 mcg into inhaler and inhale daily.  . valACYclovir (VALTREX) 500 MG tablet Take 1 tablet (500 mg total) by mouth every other day.  Marland Kitchen atorvastatin (LIPITOR) 40 MG tablet Take 1 tablet (40 mg total) by mouth daily.   No facility-administered encounter medications on file as of 10/08/2017.     Activities of Daily Living In your present state of health, do you have any difficulty performing the following activities: 10/08/2017 10/08/2016  Hearing? [name]  Comment [name]? N N  Difficulty concentrating or making decisions? N N  Walking or climbing stairs? N N  Dressing or bathing? N N  Doing errands, shopping? N N  Preparing Food and eating ? N N  Using the Toilet? N N  In the past six months, have you accidently leaked urine? N N  Do you have problems with loss of bowel control? N N  Managing your Medications? N N  Managing your Finances? N N  Housekeeping or managing your Housekeeping? N  N  Some recent data might be hidden    Patient Care Team: Donald Haven, MD as PCP - General (Family Medicine) Donald Merritts, MD as Consulting Physician (Cardiology)   Assessment:    This is a routine wellness examination for Donald [name]. The goal of the wellness visit is to assist the patient how to close the gaps in care and create a preventative care plan for the patient.   The roster of all physicians providing medical care to patient is listed in the Snapshot section of the chart.  Taking calcium VIT D as appropriate/Osteoporosis risk reviewed.    Safety issues reviewed; Smoke and carbon monoxide detectors in the home.  Firearms locked up in the home. Wears seatbelts when driving or riding with others.  Patient does wear sunscreen or protective clothing when in direct sunlight. No violence in the home.  Depression- PHQ 2 &9 complete.  No signs/symptoms or verbal communication regarding little pleasure in doing things, feeling down, depressed or hopeless. No changes in sleeping, energy, eating, concentrating.  No thoughts of self harm or harm towards others.  Time spent on this topic is 9 minutes.   Patient is alert, normal appearance, oriented to person/place/and time. Correctly identified the president of the [place], recall of 3/3 words, and performing simple calculations. Displays appropriate judgement and can read correct time from watch face.   No new identified risk were noted.  No failures at ADL's or IADL's.    BMI- discussed the importance of a healthy diet, water intake and the benefits of aerobic exercise. Educational material provided.   24 hour diet recall: Breakfast: cereal, fruit Lunch: deli sandwich, yogurt Dinner: carrots, green beans, boiled potatoes  Daily fluid intake: 4 cups of caffeine, 6 cups of water  Dental- dentures.  Eye- Visual acuity not assessed per patient preference since they have regular follow up with the ophthalmologist.  Wears corrective lenses.  Sleep patterns- Sleeps 6-7 hours at night. Nocturia x2. Wakes feeling rested.  Health maintenance gaps- closed.  Patient Concerns: None at this time. Follow up with PCP as needed.  Exercise Activities and Dietary recommendations Current Exercise Habits: Home exercise routine, Type of exercise: walking, Time (Minutes): 30, Frequency (Times/Week): 7, Weekly Exercise (Minutes/Week): 210, Intensity: Mild  Goals    . Increase physical activity     Walk for exercise      Fall Risk Fall Risk  10/08/2017 10/08/2016 05/20/2016 11/17/2015 10/02/2015  Falls in the past year? Yes No No No No  Number falls in past yr: 1 - - - -  Injury with Fall? No - - - -  Follow up Falls prevention discussed;Education provided  - - - -   Depression Screen PHQ 2/9 Scores 10/08/2017 10/08/2016 05/20/2016 11/17/2015  PHQ - 2 Score 0 0 0 0  PHQ- 9 Score 0 - - -    Cognitive Function MMSE - Mini Mental State Exam 10/08/2017 10/02/2015  Orientation to time 5 5  Orientation to Place 5 5  Registration 3 3  Attention/ Calculation 5 5  Recall 3 3  Language- name 2 objects 2 2  Language- repeat 1 1  Language- follow 3 step command 3 3  Language- read & follow direction 1 1  Write a sentence 1 1  Copy design 1 1  Total score 30 30     6CIT Screen 10/08/2016  What Year? 0 points  What month? 0 points  What time? 0 points  Count back from 20 0 points  Months in reverse 0 points  Repeat phrase 0 points  Total Score 0    Immunization History  Administered Date(s) Administered  . Influenza Split 08/05/2011, 08/19/2012, 07/29/2013  . Influenza, High Dose Seasonal PF 08/05/2016  . Influenza,inj,Quad PF,6+ Mos 07/20/2014  . Influenza-Unspecified 08/11/2015  . Pneumococcal Conjugate-13 03/18/2014  . Pneumococcal Polysaccharide-23 03/18/2008  . Tdap 09/10/2011   Screening Tests Health Maintenance  Topic Date Due  . TETANUS/TDAP  09/09/2021  . INFLUENZA VACCINE  Completed  . PNA vac Low Risk Adult  Completed      Plan:    End of life planning; Advance aging; Advanced directives discussed. Copy of current HCPOA/Living Will requested.    I have personally reviewed and noted the following in the patient's chart:   . Medical and social history . Use of alcohol, tobacco or illicit drugs  . Current medications and supplements . Functional ability and status . Nutritional status . Physical activity . Advanced directives . List of other physicians . Hospitalizations, surgeries, and ER visits in previous 12 months . Vitals . Screenings to include cognitive, depression, and falls . Referrals and appointments  In addition, I have reviewed and discussed with patient certain preventive protocols, quality  metrics, and best practice recommendations. A written personalized care plan for preventive services as well as general preventive health recommendations were provided to patient.     Varney Biles, LPN  05/22/1974

## 2017-10-08 NOTE — Patient Instructions (Addendum)
  Donald [name] , Thank you for taking time to come for your Medicare Wellness Visit. I appreciate your ongoing commitment to your health goals. Please review the following plan we discussed and let me know if I can assist you in the future.   These are the goals we discussed: Goals    . Increase physical activity     Walk for exercise       This is a list of the screening recommended for you and due dates:  Health Maintenance  Topic Date Due  . Tetanus Vaccine  09/09/2021  . Flu Shot  Completed  . Pneumonia vaccines  Completed

## 2017-10-09 ENCOUNTER — Ambulatory Visit: Payer: Medicare HMO | Admitting: Family Medicine

## 2017-10-09 ENCOUNTER — Encounter: Payer: Self-pay | Admitting: Family Medicine

## 2017-10-09 VITALS — BP 146/60 | HR 65 | Temp 97.7°F | Resp 14 | Wt 217.2 lb

## 2017-10-09 DIAGNOSIS — K219 Gastro-esophageal reflux disease without esophagitis: Secondary | ICD-10-CM | POA: Diagnosis not present

## 2017-10-09 DIAGNOSIS — E7849 Other hyperlipidemia: Secondary | ICD-10-CM | POA: Diagnosis not present

## 2017-10-09 DIAGNOSIS — N403 Nodular prostate with lower urinary tract symptoms: Secondary | ICD-10-CM

## 2017-10-09 DIAGNOSIS — J441 Chronic obstructive pulmonary disease with (acute) exacerbation: Secondary | ICD-10-CM | POA: Diagnosis not present

## 2017-10-09 DIAGNOSIS — I10 Essential (primary) hypertension: Secondary | ICD-10-CM | POA: Diagnosis not present

## 2017-10-09 DIAGNOSIS — N138 Other obstructive and reflux uropathy: Secondary | ICD-10-CM | POA: Diagnosis not present

## 2017-10-09 LAB — LIPID PANEL
CHOL/HDL RATIO: 4
CHOLESTEROL: 180 mg/dL (ref 0–200)
HDL: 49.2 mg/dL (ref >39.00)
NonHDL: 130.32
TRIGLYCERIDES: 229 mg/dL — AB (ref 0.0–149.0)
VLDL: 45.8 mg/dL — ABNORMAL HIGH (ref 0.0–40.0)

## 2017-10-09 LAB — COMPREHENSIVE METABOLIC PANEL
ALBUMIN: 4.1 g/dL (ref 3.5–5.2)
ALT: 36 U/L (ref 0–53)
AST: 32 U/L (ref 0–37)
Alkaline Phosphatase: 76 U/L (ref 39–117)
BUN: 21 mg/dL (ref 6–23)
CALCIUM: 9.6 mg/dL (ref 8.4–10.5)
CHLORIDE: 103 meq/L (ref 96–112)
CO2: 26 mEq/L (ref 19–32)
CREATININE: 1.15 mg/dL (ref 0.40–1.50)
GFR: 64.57 mL/min (ref >60.00)
Glucose, Bld: 112 mg/dL — ABNORMAL HIGH (ref 70–99)
Potassium: 4.8 mEq/L (ref 3.5–5.1)
Sodium: 136 mEq/L (ref 135–145)
Total Bilirubin: 0.4 mg/dL (ref 0.2–1.2)
Total Protein: 7 g/dL (ref 6.0–8.3)

## 2017-10-09 LAB — HEMOGLOBIN A1C: Hgb A1c MFr Bld: 6.6 % — ABNORMAL HIGH (ref 4.6–6.5)

## 2017-10-09 LAB — LDL CHOLESTEROL, DIRECT: Direct LDL: 88 mg/dL

## 2017-10-09 LAB — PSA, MEDICARE: PSA: 2.16 ng/ml (ref 0.10–4.00)

## 2017-10-09 MED ORDER — PREDNISONE 20 MG PO TABS: 40.0000 mg | ORAL_TABLET | Freq: Every day | ORAL | 0 refills | Status: DC

## 2017-10-09 NOTE — Assessment & Plan Note (Signed)
Due for labs. Well controlled for age.

## 2017-10-09 NOTE — Assessment & Plan Note (Signed)
Follows with urology.  Continue current regimen.

## 2017-10-09 NOTE — Patient Instructions (Signed)
Nice to see you. You likely have a mild COPD exacerbation.  We will treat you with prednisone. Please continue your omeprazole.  If you have trouble swallowing please let us know. If you develop shortness of breath, cough productive of blood, or fevers please be reevaluated.

## 2017-10-09 NOTE — Assessment & Plan Note (Signed)
Controlled.  No recent dysphasia.  Continue omeprazole.  If he has issues with swallowing he will let us know so we can refer back to ENT.

## 2017-10-09 NOTE — Assessment & Plan Note (Signed)
Due for labs

## 2017-10-09 NOTE — Assessment & Plan Note (Signed)
History and exam consistent with mild COPD exacerbation.  Will treat with prednisone.  He will use his inhalers.  If not improving could consider addition of antibiotic.  Given return precautions.

## 2017-10-09 NOTE — Progress Notes (Signed)
Tommi Rumps, MD Phone: [phone]  Donald [name] is a 83 y.o. male who presents today for follow-up.  COPD exacerbation: Patient notes over the last couple of weeks he has had a persistent cough.  Over the last 5 days has become productive of yellow mucus.  No wheezing.  No shortness of breath.  No significant nasal or chest congestion.  No fevers.  His wife has been sick.  He does not feel ill.  Some muscular soreness.  Albuterol is not been very beneficial.  GERD: Patient notes no reflux symptoms.  Omeprazole is very beneficial.  Has not had any issues with swallowing over the last year per his report.  Only has issues if he misses his omeprazole.  Occasionally he will gag.  He is not interested in seeing ENT.  He follows with urology for his prostate.  His PSA has been okay.  He urinates 1-2 times a night.  No straining.  Some urgency.  He does take his Flomax and Avodart.  Social History   Tobacco Use  Smoking Status Former Smoker  . Packs/day: 1.50  . Years: 50.00  . Pack years: 75.00  . Types: Cigarettes  . Last attempt to quit: 09/10/1999  . Years since quitting: 18.0  Smokeless Tobacco Never Used     ROS see history of present illness  Objective  Physical Exam Vitals:   10/09/17 0833  BP: (!) 146/60  Pulse: 65  Resp: 14  Temp: 97.7 F (36.5 C)  SpO2: 96%    BP Readings from Last 3 Encounters:  10/09/17 (!) 146/60  10/08/17 140/64  09/08/17 140/60   Wt Readings from Last 3 Encounters:  10/09/17 217 lb 3.2 oz (98.5 kg)  10/08/17 217 lb 12.8 oz (98.8 kg)  09/08/17 218 lb 8 oz (99.1 kg)    Physical Exam  Constitutional: No distress.  Cardiovascular: Normal rate, regular rhythm and normal heart sounds.  Pulmonary/Chest: Effort normal. No respiratory distress. He has no wheezes. He has no rales.  Scattered coarse breath sounds  Musculoskeletal: He exhibits no edema.  Neurological: He is alert. Gait normal.  Skin: He is not diaphoretic.      Assessment/Plan: Please see individual problem list.  COPD exacerbation History and exam consistent with mild COPD exacerbation.  Will treat with prednisone.  He will use his inhalers.  If not improving could consider addition of antibiotic.  Given return precautions.  GERD (gastroesophageal reflux disease) Controlled.  No recent dysphasia.  Continue omeprazole.  If he has issues with swallowing he will let us know so we can refer back to ENT.   Nodular prostate with urinary obstruction Follows with urology.  Continue current regimen.  Essential hypertension Due for labs. Well controlled for age.   Familial multiple lipoprotein-type hyperlipidemia Due for labs.    Donald [name] was seen today for follow-up and cough.  Diagnoses and all orders for this visit:  Familial multiple lipoprotein-type hyperlipidemia -     Comp Met (CMET) -     Lipid panel  COPD exacerbation ([place])  Gastroesophageal reflux disease, esophagitis presence not specified  Nodular prostate with urinary obstruction -     PSA, Medicare  Essential hypertension -     HgB A1c  Other orders -     predniSONE (DELTASONE) 20 MG tablet; Take 2 tablets (40 mg total) by mouth daily with breakfast.    Orders Placed This Encounter  Procedures  . PSA, Medicare  . Comp Met (CMET)  . Lipid panel  .  HgB A1c    Meds ordered this encounter  Medications  . predniSONE (DELTASONE) 20 MG tablet    Sig: Take 2 tablets (40 mg total) by mouth daily with breakfast.    Dispense:  10 tablet    Refill:  0     Tommi Rumps, MD [place]

## 2017-10-11 ENCOUNTER — Encounter: Payer: Self-pay | Admitting: Family Medicine

## 2017-10-11 DIAGNOSIS — E119 Type 2 diabetes mellitus without complications: Secondary | ICD-10-CM | POA: Insufficient documentation

## 2017-11-12 ENCOUNTER — Ambulatory Visit: Payer: Medicare HMO | Admitting: Pulmonary Disease

## 2017-11-12 ENCOUNTER — Encounter: Payer: Self-pay | Admitting: Pulmonary Disease

## 2017-11-12 VITALS — BP 164/70 | HR 77 | Resp 16 | Ht 68.0 in | Wt 218.0 lb

## 2017-11-12 DIAGNOSIS — J449 Chronic obstructive pulmonary disease, unspecified: Secondary | ICD-10-CM | POA: Diagnosis not present

## 2017-11-12 DIAGNOSIS — R0609 Other forms of dyspnea: Secondary | ICD-10-CM

## 2017-11-12 DIAGNOSIS — R0600 Dyspnea, unspecified: Secondary | ICD-10-CM

## 2017-11-12 MED ORDER — FLUTICASONE FUROATE-VILANTEROL 100-25 MCG/INH IN AEPB: 1.0000 | INHALATION_SPRAY | Freq: Every day | RESPIRATORY_TRACT | 0 refills | Status: DC

## 2017-11-12 NOTE — Progress Notes (Signed)
Patient seen in the office today and instructed on use of Breo.  Patient expressed understanding and demonstrated technique.

## 2017-11-12 NOTE — Progress Notes (Signed)
PULMONARY OFFICE FOLLOW UP NOTE  Requesting MD/Service: self referred Date of initial consultation: 11/27/15 Reason for consultation: dyspnea, cough, former smoker, mild COPD  PT PROFILE: 84 y.o. M smoker of > 50 PYs previously seen by Dr Lake Bells and diagnosed with COPD. Self referred for eval of DOE and occasional cough productive of scant discolored mucus. PFTs 2014 revealed very mild obstruction  PROBLEMS: COPD - mild by PFTs 2014 Former smoker Class I dyspnea  INTERVAL HISTORY: No major events since last visit 05/09/17  SUBJ: No new complaints. Remains on Spiriva. Continues to rarely use albuterol.  Stopped exercising due to LE pain.  Now he notes increased DOE.  Denies CP, fever, purulent sputum, hemoptysis, LE edema and calf tenderness.  OBJ: Vitals:   11/12/17 0930 11/12/17 0940  BP:  (!) 164/70  Pulse:  77  Resp: 16   SpO2:  96%  Weight: 98.9 kg (218 lb)   Height: 5\' 8"  (1.727 m)    EXAM:  Gen: NAD HEENT: WNL Neck: No JVD Lungs: mildly diminished BS, no wheezes Cardiovascular: Reg, no murmurs Abdomen: NABS, soft Ext: without clubbing, cyanosis, edema Neuro: grossly intact  DATA: No new data  IMPRESSION: 1) Moderate COPD  2) Worsening exertional dyspnea    Worsening dyspnea is likely due to deconditioning and loss of compensation  PLAN: Continue Spiriva inhaler Trial of Breo inhaler -sample provided. If he feels that the [facility] inhaler is beneficial, he is to contact our office and we will provide a prescription for this medicine or an equivalent (we could consolidate all three into Trelegy) Referral to pulmonary rehab program Follow-up in 3-4 months   Merton Border, MD PCCM service Mobile [phone] Pager [phone] 11/12/2017 4:30 PM

## 2017-11-12 NOTE — Patient Instructions (Signed)
Continue Spiriva inhaler Trial of Breo inhaler -sample provided. If you feel that the Breo inhaler was beneficial, contact our office and we will provide a prescription for this medicine or an equivalent Referral to pulmonary rehab program Follow-up in 3-4 months

## 2017-12-23 ENCOUNTER — Ambulatory Visit (INDEPENDENT_AMBULATORY_CARE_PROVIDER_SITE_OTHER): Payer: Medicare HMO | Admitting: Internal Medicine

## 2017-12-23 ENCOUNTER — Encounter: Payer: Self-pay | Admitting: Internal Medicine

## 2017-12-23 VITALS — BP 126/68 | HR 63 | Temp 97.5°F | Resp 18 | Wt 217.0 lb

## 2017-12-23 DIAGNOSIS — R490 Dysphonia: Secondary | ICD-10-CM

## 2017-12-23 DIAGNOSIS — K1370 Unspecified lesions of oral mucosa: Secondary | ICD-10-CM | POA: Diagnosis not present

## 2017-12-23 DIAGNOSIS — R1314 Dysphagia, pharyngoesophageal phase: Secondary | ICD-10-CM | POA: Diagnosis not present

## 2017-12-23 DIAGNOSIS — J449 Chronic obstructive pulmonary disease, unspecified: Secondary | ICD-10-CM

## 2017-12-23 DIAGNOSIS — K219 Gastro-esophageal reflux disease without esophagitis: Secondary | ICD-10-CM | POA: Diagnosis not present

## 2017-12-23 DIAGNOSIS — Z87891 Personal history of nicotine dependence: Secondary | ICD-10-CM | POA: Diagnosis not present

## 2017-12-23 MED ORDER — FIRST-DUKES MOUTHWASH MT SUSP: OROMUCOSAL | 0 refills | Status: DC
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
## 2017-12-25 ENCOUNTER — Encounter: Payer: Self-pay | Admitting: Internal Medicine

## 2017-12-25 NOTE — Assessment & Plan Note (Signed)
Persistent sore mouth lesion as outlined.  Small area of erythema - posterior oropharynx.  Dukes mouthwash.  Have ENT evaluate.  Pt agreeable.

## 2017-12-25 NOTE — Assessment & Plan Note (Signed)
History of tobacco use.  Given persistent mouth soreness/lesion, hoarseness and dysphagia/odynophagia, refer to ENT and GI as outlined.

## 2017-12-25 NOTE — Assessment & Plan Note (Signed)
On omeprazole.  GI evaluation as outlined.

## 2017-12-25 NOTE — Assessment & Plan Note (Signed)
Persistent issues with dysphagia.  Appears to be worsening.  Also with odynophagia.  Discussed with him today.  Per report has had GI evaluation previously with slight hernia and slight reflux found on barium swallow.  Given persistent and worsening issues, will refer to GI for reevaluation.  Pt comfortable with this plan.  On omeprazole.  Eat slowly.  Chew food well.  Small bites.

## 2017-12-25 NOTE — Assessment & Plan Note (Signed)
Breathing stable. On spiriva.  Just evaluated by pulmonary 11/12/17.

## 2017-12-25 NOTE — Assessment & Plan Note (Signed)
Hoarseness as outlined.  Continue omeprazole.  Refer to ENT for further evaluation.

## 2018-01-03 ENCOUNTER — Other Ambulatory Visit: Payer: Self-pay

## 2018-01-03 ENCOUNTER — Emergency Department: Payer: Medicare HMO

## 2018-01-03 ENCOUNTER — Encounter: Payer: Self-pay | Admitting: Emergency Medicine

## 2018-01-03 ENCOUNTER — Observation Stay
Admission: EM | Admit: 2018-01-03 | Discharge: 2018-01-04 | Disposition: A | Discharge status: Home / Self Care | Payer: Medicare HMO | Attending: Internal Medicine | Admitting: Internal Medicine

## 2018-01-03 DIAGNOSIS — Z87891 Personal history of nicotine dependence: Secondary | ICD-10-CM | POA: Diagnosis not present

## 2018-01-03 DIAGNOSIS — Z6831 Body mass index (BMI) 31.0-31.9, adult: Secondary | ICD-10-CM | POA: Insufficient documentation

## 2018-01-03 DIAGNOSIS — Z79899 Other long term (current) drug therapy: Secondary | ICD-10-CM | POA: Diagnosis not present

## 2018-01-03 DIAGNOSIS — I10 Essential (primary) hypertension: Secondary | ICD-10-CM | POA: Insufficient documentation

## 2018-01-03 DIAGNOSIS — F329 Major depressive disorder, single episode, unspecified: Secondary | ICD-10-CM | POA: Insufficient documentation

## 2018-01-03 DIAGNOSIS — N400 Enlarged prostate without lower urinary tract symptoms: Secondary | ICD-10-CM | POA: Insufficient documentation

## 2018-01-03 DIAGNOSIS — E119 Type 2 diabetes mellitus without complications: Secondary | ICD-10-CM | POA: Insufficient documentation

## 2018-01-03 DIAGNOSIS — D72829 Elevated white blood cell count, unspecified: Secondary | ICD-10-CM | POA: Diagnosis not present

## 2018-01-03 DIAGNOSIS — J440 Chronic obstructive pulmonary disease with acute lower respiratory infection: Secondary | ICD-10-CM | POA: Diagnosis not present

## 2018-01-03 DIAGNOSIS — J189 Pneumonia, unspecified organism: Principal | ICD-10-CM | POA: Diagnosis present

## 2018-01-03 DIAGNOSIS — K219 Gastro-esophageal reflux disease without esophagitis: Secondary | ICD-10-CM | POA: Diagnosis not present

## 2018-01-03 DIAGNOSIS — Z7951 Long term (current) use of inhaled steroids: Secondary | ICD-10-CM | POA: Insufficient documentation

## 2018-01-03 DIAGNOSIS — R531 Weakness: Secondary | ICD-10-CM

## 2018-01-03 DIAGNOSIS — R0602 Shortness of breath: Secondary | ICD-10-CM

## 2018-01-03 DIAGNOSIS — E669 Obesity, unspecified: Secondary | ICD-10-CM | POA: Diagnosis not present

## 2018-01-03 DIAGNOSIS — E7849 Other hyperlipidemia: Secondary | ICD-10-CM | POA: Diagnosis not present

## 2018-01-03 LAB — COMPREHENSIVE METABOLIC PANEL
ALK PHOS: 72 U/L (ref 38–126)
ALT: 23 U/L (ref 17–63)
ANION GAP: 11 (ref 5–15)
AST: 31 U/L (ref 15–41)
Albumin: 4.1 g/dL (ref 3.5–5.0)
BUN: 28 mg/dL — ABNORMAL HIGH (ref 6–20)
CALCIUM: 9.2 mg/dL (ref 8.9–10.3)
CO2: 18 mmol/L — AB (ref 22–32)
Chloride: 109 mmol/L (ref 101–111)
Creatinine, Ser: 1.46 mg/dL — ABNORMAL HIGH (ref 0.61–1.24)
GFR, EST AFRICAN AMERICAN: 49 mL/min — AB (ref >60)
GFR, EST NON AFRICAN AMERICAN: 43 mL/min — AB (ref >60)
Glucose, Bld: 167 mg/dL — ABNORMAL HIGH (ref 65–99)
Potassium: 4 mmol/L (ref 3.5–5.1)
SODIUM: 138 mmol/L (ref 135–145)
Total Bilirubin: 0.4 mg/dL (ref 0.3–1.2)
Total Protein: 7.4 g/dL (ref 6.5–8.1)

## 2018-01-03 LAB — CBC
HCT: 36.9 % — ABNORMAL LOW (ref 40.0–52.0)
HEMOGLOBIN: 12.1 g/dL — AB (ref 13.0–18.0)
MCH: 28.5 pg (ref 26.0–34.0)
MCHC: 32.9 g/dL (ref 32.0–36.0)
MCV: 86.7 fL (ref 80.0–100.0)
Platelets: 246 10*3/uL (ref 150–440)
RBC: 4.25 MIL/uL — ABNORMAL LOW (ref 4.40–5.90)
RDW: 13.9 % (ref 11.5–14.5)
WBC: 24.1 10*3/uL — ABNORMAL HIGH (ref 3.8–10.6)

## 2018-01-03 LAB — INFLUENZA PANEL BY PCR (TYPE A & B)
INFLAPCR: NEGATIVE
INFLBPCR: NEGATIVE

## 2018-01-03 LAB — PROCALCITONIN: PROCALCITONIN: 0.75 ng/mL

## 2018-01-03 LAB — TROPONIN I
TROPONIN I: 0.06 ng/mL — AB (ref <0.03)
TROPONIN I: 0.05 ng/mL — AB (ref <0.03)

## 2018-01-03 MED ORDER — HEPARIN SODIUM (PORCINE) 5000 UNIT/ML IJ SOLN
5000.0000 [IU] | Freq: Three times a day (TID) | INTRAMUSCULAR | Status: DC
  Administered 2018-01-03 – 2018-01-04 (×2): 5000 [IU] via SUBCUTANEOUS
  Filled 2018-01-03 (×2): qty 1

## 2018-01-03 MED ORDER — ONDANSETRON HCL 4 MG PO TABS: 4.0000 mg | ORAL_TABLET | Freq: Four times a day (QID) | ORAL | Status: DC | PRN

## 2018-01-03 MED ORDER — ACETAMINOPHEN 325 MG PO TABS: 650.0000 mg | ORAL_TABLET | Freq: Four times a day (QID) | ORAL | Status: DC | PRN

## 2018-01-03 MED ORDER — PANTOPRAZOLE SODIUM 40 MG PO TBEC
40.0000 mg | DELAYED_RELEASE_TABLET | Freq: Every day | ORAL | Status: DC
  Administered 2018-01-04: 40 mg via ORAL
  Filled 2018-01-03: qty 1

## 2018-01-03 MED ORDER — SERTRALINE HCL 50 MG PO TABS
50.0000 mg | ORAL_TABLET | Freq: Every day | ORAL | Status: DC
  Administered 2018-01-04: 50 mg via ORAL
  Filled 2018-01-03: qty 1

## 2018-01-03 MED ORDER — AMLODIPINE BESYLATE 5 MG PO TABS
5.0000 mg | ORAL_TABLET | Freq: Two times a day (BID) | ORAL | Status: DC
  Administered 2018-01-03 – 2018-01-04 (×2): 5 mg via ORAL
  Filled 2018-01-03 (×3): qty 1

## 2018-01-03 MED ORDER — ATORVASTATIN CALCIUM 20 MG PO TABS
40.0000 mg | ORAL_TABLET | Freq: Every day | ORAL | Status: DC
  Administered 2018-01-04: 40 mg via ORAL
  Filled 2018-01-03: qty 2

## 2018-01-03 MED ORDER — LEVOFLOXACIN IN D5W 750 MG/150ML IV SOLN
750.0000 mg | Freq: Once | INTRAVENOUS | Status: AC
Start: 2018-01-03 — End: 2018-01-03
  Administered 2018-01-03: 750 mg via INTRAVENOUS
  Filled 2018-01-03: qty 150

## 2018-01-03 MED ORDER — ACETAMINOPHEN 650 MG RE SUPP: 650.0000 mg | Freq: Four times a day (QID) | RECTAL | Status: DC | PRN

## 2018-01-03 MED ORDER — SODIUM CHLORIDE 0.9 % IV SOLN: 1250.0000 mg | INTRAVENOUS | Status: DC

## 2018-01-03 MED ORDER — TIOTROPIUM BROMIDE MONOHYDRATE 18 MCG IN CAPS
18.0000 ug | ORAL_CAPSULE | Freq: Every day | RESPIRATORY_TRACT | Status: DC
  Administered 2018-01-04: 18 ug via RESPIRATORY_TRACT
  Filled 2018-01-03: qty 5

## 2018-01-03 MED ORDER — TAMSULOSIN HCL 0.4 MG PO CAPS
0.4000 mg | ORAL_CAPSULE | Freq: Every day | ORAL | Status: DC
  Administered 2018-01-04: 0.4 mg via ORAL
  Filled 2018-01-03: qty 1

## 2018-01-03 MED ORDER — ADULT MULTIVITAMIN W/MINERALS CH
1.0000 | ORAL_TABLET | Freq: Every day | ORAL | Status: DC
  Administered 2018-01-04: 1 via ORAL
  Filled 2018-01-03: qty 1

## 2018-01-03 MED ORDER — LEVOFLOXACIN IN D5W 750 MG/150ML IV SOLN: 750.0000 mg | INTRAVENOUS | Status: DC

## 2018-01-03 MED ORDER — VITAMIN D 1000 UNITS PO TABS
1000.0000 [IU] | ORAL_TABLET | Freq: Every day | ORAL | Status: DC
  Administered 2018-01-04: 1000 [IU] via ORAL
  Filled 2018-01-03: qty 1

## 2018-01-03 MED ORDER — FLUTICASONE FUROATE-VILANTEROL 100-25 MCG/INH IN AEPB
1.0000 | INHALATION_SPRAY | Freq: Every day | RESPIRATORY_TRACT | Status: DC
  Administered 2018-01-04: 1 via RESPIRATORY_TRACT
  Filled 2018-01-03: qty 28

## 2018-01-03 MED ORDER — ALBUTEROL SULFATE (2.5 MG/3ML) 0.083% IN NEBU: 3.0000 mL | INHALATION_SOLUTION | RESPIRATORY_TRACT | Status: DC | PRN

## 2018-01-03 MED ORDER — DUTASTERIDE 0.5 MG PO CAPS: 0.5000 mg | ORAL_CAPSULE | ORAL | Status: DC

## 2018-01-03 MED ORDER — ONDANSETRON HCL 4 MG/2ML IJ SOLN: 4.0000 mg | Freq: Four times a day (QID) | INTRAMUSCULAR | Status: DC | PRN

## 2018-01-03 MED ORDER — VANCOMYCIN HCL IN DEXTROSE 1-5 GM/200ML-% IV SOLN
1000.0000 mg | Freq: Once | INTRAVENOUS | Status: AC
  Administered 2018-01-03: 1000 mg via INTRAVENOUS
  Filled 2018-01-03: qty 200

## 2018-01-03 MED ORDER — VALACYCLOVIR HCL 500 MG PO TABS
500.0000 mg | ORAL_TABLET | ORAL | Status: DC
  Administered 2018-01-04: 500 mg via ORAL
  Filled 2018-01-03: qty 1

## 2018-01-03 NOTE — ED Notes (Signed)
Pt updated on admission to floor delay. Pt verbalizes understanding.

## 2018-01-03 NOTE — H&P (Signed)
[facility] NAME: Donald [name]    MR#:  [id number]  DATE OF BIRTH:  11-22-1932  DATE OF ADMISSION:  01/03/2018  PRIMARY CARE PHYSICIAN: [name], MD   REQUESTING/REFERRING PHYSICIAN: Dr. Harvest Dark  CHIEF COMPLAINT:   Chief Complaint  Patient presents with  . Shortness of Breath    HISTORY OF PRESENT ILLNESS:  Donald [name]  is a 84 y.o. male with a known history of essential hypertension, hyperlipidemia, GERD, COPD, osteoarthritis, BPH who presents to the hospital due to shortness of breath. Patient says he was in his usual state of health and was driving to a pharmacy to pick up some medications for his wife when he became significantly short of breath. He was able to pick up the medication and drove home and then when he got home his wife noticed that he was increasingly weak, pale appearing and was having significant shortness of breath even at rest. She brought him to the ER for further evaluation. In the emergency room patient was noted to be the Tachypnic, but not hypoxic. His chest x-ray did not show any acute abnormality, but he was noted to have a leukocytosis therefore underwent a CT scan of his chest which was suggestive of pneumonia. Hospitalist services were contacted further treatment and evaluation. Patient denies any prodromal symptoms of cough, congestion, chills, fever, nausea, vomiting or any other associated symptoms presently.  PAST MEDICAL HISTORY:   Past Medical History:  Diagnosis Date  . Arthritis   . Esophageal reflux   . Hyperlipidemia   . Hypertension   . Sebaceous cyst     PAST SURGICAL HISTORY:   Past Surgical History:  Procedure Laterality Date  . HEMORRHOID SURGERY      SOCIAL HISTORY:   Social History   Tobacco Use  . Smoking status: Former Smoker    Packs/day: 1.50    Years: 50.00    Pack years: 75.00    Types: Cigarettes    Last attempt to quit: 09/10/1999     Years since quitting: 18.3  . Smokeless tobacco: Never Used  Substance Use Topics  . Alcohol use: No    Alcohol/week: 0.0 oz    FAMILY HISTORY:   Family History  Problem Relation Age of Onset  . Heart disease Mother   . Cancer Maternal Aunt        lung    DRUG ALLERGIES:   Allergies  Allergen Reactions  . Cephalexin Rash    REVIEW OF SYSTEMS:   Review of Systems  Constitutional: Positive for malaise/fatigue. Negative for fever and weight loss.  HENT: Negative for congestion, nosebleeds and tinnitus.   Eyes: Negative for blurred vision, double vision and redness.  Respiratory: Positive for shortness of breath. Negative for hemoptysis.   Cardiovascular: Negative for chest pain, orthopnea, leg swelling and PND.  Gastrointestinal: Negative for abdominal pain, diarrhea, melena, nausea and vomiting.  Genitourinary: Negative for dysuria, hematuria and urgency.  Musculoskeletal: Negative for falls and joint pain.  Neurological: Positive for weakness. Negative for dizziness, tingling, sensory change, focal weakness, seizures and headaches.  Endo/Heme/Allergies: Negative for polydipsia. Does not bruise/bleed easily.  Psychiatric/Behavioral: Negative for depression and memory loss. The patient is not nervous/anxious.     MEDICATIONS AT HOME:   Prior to Admission medications   Medication Sig Start Date End Date Taking? Authorizing Provider  amLODipine (NORVASC) 5 MG tablet Take 1 tablet (5 mg total) by mouth 2 (two) times daily. 05/20/16  Yes Jackolyn Confer, MD  atorvastatin (LIPITOR) 40 MG tablet Take 1 tablet (40 mg total) by mouth daily. 09/06/15 01/03/18 Yes Gollan, Kathlene November, MD  cholecalciferol (VITAMIN D) 1000 UNITS tablet Take 1,000 Units by mouth daily.   Yes [provider]  Diphenhyd-Hydrocort-Nystatin (FIRST-DUKES MOUTHWASH) SUSP 5cc's swish and spit tid 12/23/17  Yes Einar Pheasant, MD  dutasteride (AVODART) 0.5 MG capsule Take 0.5 mg by mouth 2 (two) times  a week. 12/11/11  Yes Jackolyn Confer, MD  fluticasone furoate-vilanterol (BREO ELLIPTA) 100-25 MCG/INH AEPB Inhale 1 puff into the lungs daily. 11/12/17  Yes Wilhelmina Mcardle, MD  Multiple Vitamins-Minerals (MEGA MULTIVITAMIN FOR MEN) TABS Take 2 capsules by mouth daily.   Yes [provider]  omeprazole (PRILOSEC) 20 MG capsule Take 1 capsule (20 mg total) by mouth daily. 12/11/11  Yes Jackolyn Confer, MD  sertraline (ZOLOFT) 50 MG tablet Take 1 tablet (50 mg total) by mouth daily. 07/07/17  Yes [name], MD  Tamsulosin HCl (FLOMAX) 0.4 MG CAPS Take 0.4 mg by mouth daily.   Yes [provider]  tiotropium (SPIRIVA) 18 MCG inhalation capsule Place 18 mcg into inhaler and inhale daily.   Yes [provider]  valACYclovir (VALTREX) 500 MG tablet Take 1 tablet (500 mg total) by mouth every other day. 09/03/13  Yes Jackolyn Confer, MD  albuterol (PROVENTIL HFA;VENTOLIN HFA) 108 (90 Base) MCG/ACT inhaler Inhale 1-2 puffs into the lungs every 4 (four) hours as needed for wheezing or shortness of breath. 10/14/16   Wilhelmina Mcardle, MD      VITAL SIGNS:  Blood pressure (!) 129/48, pulse 81, temperature 98.7 F (37.1 C), temperature source Oral, resp. rate 18, height 5\' 8"  (1.727 m), weight 95.3 kg (210 lb), SpO2 95 %.  PHYSICAL EXAMINATION:  Physical Exam  GENERAL:  84 y.o.-year-old patient lying in the bed in no acute distress.  EYES: Pupils equal, round, reactive to light and accommodation. No scleral icterus. Extraocular muscles intact.  HEENT: Head atraumatic, normocephalic. Oropharynx and nasopharynx clear. No oropharyngeal erythema, moist oral mucosa  NECK:  Supple, no jugular venous distention. No thyroid enlargement, no tenderness.  LUNGS: Normal breath sounds bilaterally, no wheezing, rales, rhonchi. No use of accessory muscles of respiration.  CARDIOVASCULAR: S1, S2 RRR. No murmurs, rubs, gallops, clicks.  ABDOMEN: Soft, nontender, nondistended.  Bowel sounds present. No organomegaly or mass.  EXTREMITIES: No pedal edema, cyanosis, or clubbing. + 2 pedal & radial pulses b/l.   NEUROLOGIC: Cranial nerves II through XII are intact. No focal Motor or sensory deficits appreciated b/l PSYCHIATRIC: The patient is alert and oriented x 3.  SKIN: No obvious rash, lesion, or ulcer.   LABORATORY PANEL:   CBC Recent Labs  Lab 01/03/18 1500  WBC 24.1*  HGB 12.1*  HCT 36.9*  PLT 246   ------------------------------------------------------------------------------------------------------------------  Chemistries  Recent Labs  Lab 01/03/18 1500  NA 138  K 4.0  CL 109  CO2 18*  GLUCOSE 167*  BUN 28*  CREATININE 1.46*  CALCIUM 9.2  AST 31  ALT 23  ALKPHOS 72  BILITOT 0.4   ------------------------------------------------------------------------------------------------------------------  Cardiac Enzymes Recent Labs  Lab 01/03/18 1500  TROPONINI 0.06*   ------------------------------------------------------------------------------------------------------------------  RADIOLOGY:  Dg Chest 2 View  Result Date: 01/03/2018 CLINICAL DATA:  Former smoker.  Shortness of breath. EXAM: CHEST  2 VIEW COMPARISON:  September 08, 2013 FINDINGS: No pneumothorax. Rounded opacity in the upper left lung was not seen previously. Mild opacity  in the left base. The heart, hila, mediastinum, lungs, and pleura are otherwise unremarkable. IMPRESSION: Opacities are seen in the upper outer left lung in the left base. Given history, this could represent pneumonia. Recommend short-term follow-up after treatment in 2 or 3 weeks to ensure resolution. Electronically Signed   By: Dorise Bullion III M.D   On: 01/03/2018 15:38   Ct Chest Wo Contrast  Result Date: 01/03/2018 CLINICAL DATA:  Shortness of breath and chest pain since eating lunch. EXAM: CT CHEST WITHOUT CONTRAST TECHNIQUE: Multidetector CT imaging of the chest was performed following the standard  protocol without IV contrast. COMPARISON:  Chest radiograph 01/03/2018 FINDINGS: Cardiovascular: Calcific atherosclerosis of the aorta and its branch vessels. There is also calcific atherosclerosis of the coronary arteries. Normal 3 vessel aortic branch pattern. Heart size is normal. No pericardial effusion. Mediastinum/Nodes: Multiple enlarged mediastinal lymph nodes, including 2.1 cm level 4 node and 2.4 cm level 7 node. Numerous bilateral axillary lymph nodes measuring up to 10 mm. The appearance of the thoracic esophagus is normal. Normal visualized thyroid. Lungs/Pleura: Bibasilar opacities, left-greater-than-right. Nodular opacity in the medial right lung base measures 13 mm. Left apical nodule measures 4 mm. No pleural effusion or pneumothorax. Upper Abdomen: Senescent fatty atrophy of the pancreas. The other visualized upper abdominal organs are normal. Multiple subcentimeter mesenteric lymph nodes. Musculoskeletal: Multilevel thoracic osteophytosis. No lytic or blastic lesions. There is no bony spinal canal stenosis. IMPRESSION: 1. Left greater than right dependent basilar opacities may indicate pneumonia and/or aspiration pneumonitis. 2. Coronary artery and aortic atherosclerosis (ICD10-I70.0). 3. Multiple enlarged mediastinal lymph nodes, measuring up to 2.4 cm, of uncertain etiology. Medial right basilar pulmonary nodule measures 1.3 cm. While this could be related to an acute inflammatory/infectious process, a neoplastic etiology is also possible. Consider one of the following in 3 months for both low-risk and high-risk individuals: (a) repeat chest CT, (b) follow-up PET-CT, or (c) tissue sampling. This recommendation follows the consensus statement: Guidelines for Management of Incidental Pulmonary Nodules Detected on CT Images: From the Fleischner Society 2017; Radiology 2017; 284:228-243. Electronically Signed   By: Ulyses Jarred M.D.   On: 01/03/2018 17:27     IMPRESSION AND PLAN:    84 year old male with past medical history of COPD, BPH, hypertension, hyperlipidemia, osteoarthritis who presents to the hospital due to shortness of breath, weakness.  1. Pneumonia-this is the cause of patient's shortness of breath and weakness. This was noted on the CT scan of the chest on admission. Patient although does not have any prodromal symptoms of cough, congestion, fever, chills. He does have a significant leukocytosis. -We'll treat the patient with IV Levaquin, follow cultures. Pt. Was given 1 dose of IV vancomycin and also Levaquin the emergency room.  2. Leukocytosis-suspected to be secondary to pneumonia. -Continue IV antibiotics as mentioned above. Follow white cell count.  3. COPD-no acute exacerbation. Continue Spiriva, Breo-Ellipta  4. Essential hypertension-continue Norvasc.  5. Hyperlipidemia-continue atorvastatin.  6. BPH-continue Avodart, Flomax.  7. GERD-continue Protonix.  8. Depression-continue Zoloft.     All the records are reviewed and case discussed with ED provider. Management plans discussed with the patient, family and they are in agreement.  CODE STATUS: Full code  TOTAL TIME TAKING CARE OF THIS PATIENT: 40 minutes.    Henreitta Leber M.D on 01/03/2018 at 6:04 PM  Between 7am to 6pm - Pager - [phone]  After 6pm go to [email or web address] - password [email or web address] [facility]  [facility]  Office  [phone]  CC:  Primary care physician; [name], MD

## 2018-01-03 NOTE — ED Triage Notes (Signed)
Began feeling SOB about 1pm today. Was driving at the time. Denies feeling like that before. States continues slight SOB.

## 2018-01-03 NOTE — ED Provider Notes (Signed)
[facility] Emergency Department Provider Note  Time seen: 4:46 PM  I have reviewed the triage vital signs and the nursing notes.   HISTORY  Chief Complaint Shortness of Breath    HPI Donald [name] is a 84 y.o. male with a past medical history of arthritis, hypertension, hyperlipidemia, diabetes, COPD, presents to the emergency department for shortness of breath and generalized fatigue/weakness.  According to the patient for the past several days he has been feeling short of breath but much worse today, also states significant generalized weakness and fatigue today.  Denies any known fever.  States cough but has a chronic cough due to COPD.  Denies sputum production.  Denies congestion or sore throat.  Denies body aches.    Past Medical History:  Diagnosis Date  . Arthritis   . Esophageal reflux   . Hyperlipidemia   . Hypertension   . Sebaceous cyst     Patient Active Problem List   Diagnosis Date Noted  . Mouth lesion 12/23/2017  . Hoarseness 12/23/2017  . Diabetes mellitus without complication ([place]) 85/27/7824  . Chronic back pain 03/05/2017  . Pain in joint involving left lower leg 08/26/2016  . GERD (gastroesophageal reflux disease) 08/05/2016  . 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 Obesity (BMI 30-39.9) 06/20/2014  . Screening for abdominal aortic aneurysm 03/18/2014  . Dermatitis 02/22/2014  . COPD exacerbation ([place]) 10/12/2013  . Chronic obstructive pulmonary disease ([place]) 10/12/2013  . Bradycardia 08/31/2013  . History of smoking 08/11/2013  . Personal history of tobacco use, presenting hazards to health 08/11/2013  . Dysphagia, pharyngoesophageal phase 05/05/2013  . 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 Chronic prostatitis 09/11/2012  . Elevated prostate specific antigen (PSA) 09/11/2012  . 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 ED (erectile dysfunction) of organic origin 09/11/2012  . Incomplete emptying of bladder 09/11/2012  .  Nodular prostate with urinary obstruction 09/11/2012  . Testicular hypofunction 09/11/2012  . Urge incontinence 09/11/2012  . Posttraumatic stress disorder 12/23/2011  . 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 Essential hypertension 09/10/2011    Past Surgical History:  Procedure Laterality Date  . HEMORRHOID SURGERY      Prior to Admission medications   Medication Sig Start Date End Date Taking? Authorizing Provider  albuterol (PROVENTIL HFA;VENTOLIN HFA) 108 (90 Base) MCG/ACT inhaler Inhale 1-2 puffs into the lungs every 4 (four) hours as needed for wheezing or shortness of breath. 10/14/16   Wilhelmina Mcardle, MD  amLODipine (NORVASC) 5 MG tablet Take 1 tablet (5 mg total) by mouth 2 (two) times daily. 05/20/16   Jackolyn Confer, MD  atorvastatin (LIPITOR) 40 MG tablet Take 1 tablet (40 mg total) by mouth daily. 09/06/15 09/08/17  Minna Merritts, MD  cholecalciferol (VITAMIN D) 1000 UNITS tablet Take 1,000 Units by mouth daily.    [provider]  Diphenhyd-Hydrocort-Nystatin (FIRST-DUKES MOUTHWASH) SUSP 5cc's swish and spit tid 12/23/17   Einar Pheasant, MD  dutasteride (AVODART) 0.5 MG capsule Take 0.5 mg by mouth 2 (two) times a week. 12/11/11   Jackolyn Confer, MD  fluticasone furoate-vilanterol (BREO ELLIPTA) 100-25 MCG/INH AEPB Inhale 1 puff into the lungs daily. 11/12/17   Wilhelmina Mcardle, MD  Multiple Vitamins-Minerals (MEGA MULTIVITAMIN FOR MEN) TABS Take 2 capsules by mouth daily.    [provider]  omeprazole (PRILOSEC) 20 MG capsule Take 1 capsule (20 mg total) by mouth daily. 12/11/11   Jackolyn Confer, MD  sertraline (ZOLOFT) 50 MG tablet Take  1 tablet (50 mg total) by mouth daily. 07/07/17   [name], MD  Tamsulosin HCl (FLOMAX) 0.4 MG CAPS Take 0.4 mg by mouth daily.    [provider]  tiotropium (SPIRIVA) 18 MCG inhalation capsule Place 18 mcg into inhaler and inhale daily.    [provider]   valACYclovir (VALTREX) 500 MG tablet Take 1 tablet (500 mg total) by mouth every other day. 09/03/13   Jackolyn Confer, MD    Allergies  Allergen Reactions  . Cephalexin Rash    Family History  Problem Relation Age of Onset  . Heart disease Mother   . Cancer Maternal Aunt        lung    Social History Social History   Tobacco Use  . Smoking status: Former Smoker    Packs/day: 1.50    Years: 50.00    Pack years: 75.00    Types: Cigarettes    Last attempt to quit: 09/10/1999    Years since quitting: 18.3  . Smokeless tobacco: Never Used  Substance Use Topics  . Alcohol use: No    Alcohol/week: 0.0 oz  . Drug use: No    Review of Systems Constitutional: Negative for fever.  Positive for generalized fatigue/weakness today Eyes: Negative for visual complaints ENT: Negative for recent illness/congestion Cardiovascular: Negative for chest pain. Respiratory: Positive shortness of breath today Gastrointestinal: Negative for abdominal pain, vomiting Genitourinary: Negative for dysuria Musculoskeletal: Negative for leg pain or swelling Skin: Negative for skin complaints  Neurological: Negative for headache All other ROS negative  ____________________________________________   PHYSICAL EXAM:  VITAL SIGNS: ED Triage Vitals  Enc Vitals Group     BP 01/03/18 1456 (!) 129/48     Pulse Rate 01/03/18 1456 81     Resp 01/03/18 1456 18     Temp 01/03/18 1456 98.7 F (37.1 C)     Temp Source 01/03/18 1456 Oral     SpO2 01/03/18 1456 95 %     Weight 01/03/18 1459 210 lb (95.3 kg)     Height 01/03/18 1459 5\' 8"  (1.727 m)     Head Circumference --      Peak Flow --      Pain Score 01/03/18 1458 1     Pain Loc --      Pain Edu? --      Excl. in [place]? --    Constitutional: Alert and oriented. Well appearing and in no distress. Eyes: Normal exam ENT   Head: Normocephalic and atraumatic.   Mouth/Throat: Mucous membranes are moist. Cardiovascular: Normal rate,  regular rhythm. No murmur Respiratory: Normal respiratory effort without tachypnea nor retractions. Breath sounds are clear and equal bilaterally. No wheezes/rales/rhonchi. Gastrointestinal: Soft and nontender. No distention. Musculoskeletal: Nontender with normal range of motion in all extremities. No lower extremity tenderness or edema. Neurologic:  Normal speech and language. No gross focal neurologic deficits  Skin:  Skin is warm, dry and intact.  Psychiatric: Mood and affect are normal.   ____________________________________________    EKG  EKG reviewed and interpreted by myself shows sinus rhythm at 86 bpm with a widened QRS, left axis deviation, largely normal intervals with nonspecific ST changes.  EKG morphology largely unchanged from prior 09/08/17  ____________________________________________    RADIOLOGY  Chest x-ray shows opacities in the upper left lung and lung base.  Possible multilobar pneumonia.  ____________________________________________   INITIAL IMPRESSION / ASSESSMENT AND PLAN / ED COURSE  Pertinent labs & imaging results that were  available during my care of the patient were reviewed by me and considered in my medical decision making (see chart for details).  Patient presents to the emergency department for generalized weakness/fatigue and shortness of breath today.  Differential at this time would include pneumonia, metabolic or electrolyte abnormality, infectious etiology, oncologic process.  Patient's x-ray is consistent with multi lobar pneumonia, EKG is largely unchanged.  Troponin is slightly elevated 0.06.  Patient has a leukocytosis of 24,000 currently.  Mild renal insufficiency but close to baseline per record review.  Will obtain a CT scan of the chest to further evaluate.  Given the patient's elevated white blood cell count we will check blood cultures start broad-spectrum antibiotics.  I anticipate likely admission to the hospital given generalized  fatigue weakness leukocytosis to 24,000 and multilobar pneumonia on x-ray.  Currently satting 94/95% on room air.  CT consistent with likely multifocal pneumonia.  We will admit to the hospitalist service for further treatment.  I discussed incidental findings such as lung nodules and repeat CT in 3 months for further evaluation. ____________________________________________   FINAL CLINICAL IMPRESSION(S) / ED DIAGNOSES  Dyspnea Weakness Pneumonia    Harvest Dark, MD 01/03/18 1734

## 2018-01-03 NOTE — Progress Notes (Addendum)
Pharmacy Antibiotic Note  Donald [name] is a 84 y.o. male admitted on 01/03/2018 with pneumonia.  Pharmacy has been consulted for levofloxacin and vancomycin dosing.  Plan:   2/23 18:52 admitting prescriber orders levofloxacin to continue. Continue levofloxacin 750 mg IV Q48H - continue to trend procalcitonin.  Height: 5\' 8"  (172.7 cm) Weight: 210 lb (95.3 kg) IBW/kg (Calculated) : 68.4  Temp (24hrs), Avg:98.7 F (37.1 C), Min:98.7 F (37.1 C), Max:98.7 F (37.1 C)  Recent Labs  Lab 01/03/18 1500  WBC 24.1*  CREATININE 1.46*    Estimated Creatinine Clearance: 42.9 mL/min (A) (by C-G formula based on SCr of 1.46 mg/dL (H)).    Allergies  Allergen Reactions  . Cephalexin Rash    Antimicrobials this admission:   >>    >>   Dose adjustments this admission:   Microbiology results:  BCx:   UCx:    Sputum:    MRSA PCR:   Thank you for allowing pharmacy to be a part of this patient's care.  Laural Benes, Pharm.D., BCPS Clinical Pharmacist 01/03/2018 4:54 PM

## 2018-01-04 ENCOUNTER — Encounter: Payer: Self-pay | Admitting: *Deleted

## 2018-01-04 LAB — CBC
HCT: 35.6 % — ABNORMAL LOW (ref 40.0–52.0)
Hemoglobin: 11.8 g/dL — ABNORMAL LOW (ref 13.0–18.0)
MCH: 28.8 pg (ref 26.0–34.0)
MCHC: 33.1 g/dL (ref 32.0–36.0)
MCV: 87.2 fL (ref 80.0–100.0)
PLATELETS: 203 10*3/uL (ref 150–440)
RBC: 4.09 MIL/uL — ABNORMAL LOW (ref 4.40–5.90)
RDW: 14.4 % (ref 11.5–14.5)
WBC: 17 10*3/uL — AB (ref 3.8–10.6)

## 2018-01-04 LAB — BASIC METABOLIC PANEL
Anion gap: 10 (ref 5–15)
BUN: 30 mg/dL — AB (ref 6–20)
CALCIUM: 8.7 mg/dL — AB (ref 8.9–10.3)
CHLORIDE: 107 mmol/L (ref 101–111)
CO2: 20 mmol/L — ABNORMAL LOW (ref 22–32)
CREATININE: 1.36 mg/dL — AB (ref 0.61–1.24)
GFR, EST AFRICAN AMERICAN: 54 mL/min — AB (ref >60)
GFR, EST NON AFRICAN AMERICAN: 46 mL/min — AB (ref >60)
Glucose, Bld: 109 mg/dL — ABNORMAL HIGH (ref 65–99)
Potassium: 3.6 mmol/L (ref 3.5–5.1)
SODIUM: 137 mmol/L (ref 135–145)

## 2018-01-04 LAB — PROCALCITONIN: PROCALCITONIN: 0.7 ng/mL

## 2018-01-04 LAB — TROPONIN I: TROPONIN I: 0.03 ng/mL — AB (ref <0.03)

## 2018-01-04 MED ORDER — LEVOFLOXACIN 500 MG PO TABS: 500.0000 mg | ORAL_TABLET | Freq: Every day | ORAL | 0 refills | Status: DC

## 2018-01-04 MED ORDER — LEVOFLOXACIN 500 MG PO TABS: 500.0000 mg | ORAL_TABLET | Freq: Every day | ORAL | 0 refills | Status: AC

## 2018-01-04 NOTE — Progress Notes (Signed)
Donald [name] to be D/C'd home per MD order.  Discussed prescriptions and follow up appointments with the patient. Prescriptions given to patient, medication list explained in detail. Pt verbalized understanding.  Allergies as of 01/04/2018      Reactions   Cephalexin Rash      Medication List    STOP taking these medications   atorvastatin 40 MG tablet Commonly known as:  LIPITOR   FIRST-DUKES MOUTHWASH Susp     TAKE these medications   albuterol 108 (90 Base) MCG/ACT inhaler Commonly known as:  PROVENTIL HFA;VENTOLIN HFA Inhale 1-2 puffs into the lungs every 4 (four) hours as needed for wheezing or shortness of breath.   amLODipine 5 MG tablet Commonly known as:  NORVASC Take 1 tablet (5 mg total) by mouth 2 (two) times daily.   cholecalciferol 1000 units tablet Commonly known as:  VITAMIN D Take 1,000 Units by mouth daily.   dutasteride 0.5 MG capsule Commonly known as:  AVODART Take 0.5 mg by mouth 2 (two) times [name] week.   fluticasone furoate-vilanterol 100-25 MCG/INH Aepb Commonly known as:  BREO ELLIPTA Inhale 1 puff into the lungs daily.   levofloxacin 500 MG tablet Commonly known as:  LEVAQUIN Take 1 tablet (500 mg total) by mouth daily for 7 days. X 7 days   MEGA MULTIVITAMIN FOR MEN Tabs Take 2 capsules by mouth daily.   omeprazole 20 MG capsule Commonly known as:  PRILOSEC Take 1 capsule (20 mg total) by mouth daily.   sertraline 50 MG tablet Commonly known as:  ZOLOFT Take 1 tablet (50 mg total) by mouth daily.   tamsulosin 0.4 MG Caps capsule Commonly known as:  FLOMAX Take 0.4 mg by mouth daily.   tiotropium 18 MCG inhalation capsule Commonly known as:  SPIRIVA Place 18 mcg into inhaler and inhale daily.   valACYclovir 500 MG tablet Commonly known as:  VALTREX Take 1 tablet (500 mg total) by mouth every other day.       Vitals:   01/03/18 1944 01/04/18 0513  BP: 133/73 (!) 131/49  Pulse: (!) 59 63  Resp: 20 20  Temp: 98.2 F  (36.8 C) 98.2 F (36.8 C)  SpO2: 100% 97%    Skin clean, dry and intact without evidence of skin break down, no evidence of skin tears noted. IV catheter discontinued intact. Site without signs and symptoms of complications. Dressing and pressure applied. Pt denies pain at this time. No complaints noted.  An After Visit Summary was printed and given to the patient. Patient escorted via [place], and D/C home via private auto.  Donald [name]

## 2018-01-05 ENCOUNTER — Telehealth: Payer: Self-pay | Admitting: Family Medicine

## 2018-01-05 ENCOUNTER — Other Ambulatory Visit: Payer: Self-pay | Admitting: *Deleted

## 2018-01-05 DIAGNOSIS — J189 Pneumonia, unspecified organism: Secondary | ICD-10-CM

## 2018-01-05 NOTE — Telephone Encounter (Signed)
Transition Care Management Follow-up Telephone Call  How have you been since you were released from the hospital? Patient states he feels much better , breathing normal now denies SOB at this time.   Do you understand why you were in the hospital? yes   Do you understand the discharge instrcutions? yes  Items Reviewed:  Medications reviewed: yes  Allergies reviewed: yes  Dietary changes reviewed: yes  Referrals reviewed: yes   Functional Questionnaire:   Activities of Daily Living (ADLs):   He states they are independent in the following: ambulation, bathing and hygiene, feeding, continence, grooming, toileting and dressing States they require assistance with the following: No assistance required at this time.   Any transportation issues/concerns?: no   Any patient concerns? no   Confirmed importance and date/time of follow-up visits scheduled: yes   Confirmed with patient if condition begins to worsen call PCP or go to the ER.  Patient was given the Call-a-Nurse line [phone]: yes

## 2018-01-05 NOTE — Discharge Summary (Signed)
[facility] NAME: Donald [name]    MR#:  [id number]  DATE OF BIRTH:  12/12/1932  DATE OF ADMISSION:  01/03/2018   ADMITTING PHYSICIAN: Henreitta Leber, MD  DATE OF DISCHARGE: 01/04/2018  2:00 PM  PRIMARY CARE PHYSICIAN: [name], MD   ADMISSION DIAGNOSIS:   Weakness [R53.1] SOB (shortness of breath) [R06.02] Community acquired pneumonia, unspecified laterality [J18.9]  DISCHARGE DIAGNOSIS:   Active Problems:   Pneumonia   SECONDARY DIAGNOSIS:   Past Medical History:  Diagnosis Date  . Arthritis   . Esophageal reflux   . Hyperlipidemia   . Hypertension   . Sebaceous cyst     HOSPITAL COURSE:   84 year old male with past medical history of COPD, BPH, hypertension, hyperlipidemia, osteoarthritis who presents to the hospital due to shortness of breath, weakness.  1. Left basilar Pneumonia- as noted on his x-ray and CT scan on admission. -Has been weaned off oxygen. Clinically much improved and leukocytosis has resolved. -Was receiving IV Levaquin in the hospital. Patient has been ambulating well, and very eager to be discharged. We'll be discharging him on oral Levaquin and advised strongly to follow-up with his PCP within a week -Breathing has significantly improved.  2. Leukocytosis-suspected to be secondary to pneumonia. -Received IV antibiotics, white count is improving. Clinically improved. Outpatient follow-up  3. COPD-no acute exacerbation. Continue Spiriva, Breo-Ellipta  4. Essential hypertension-continue Norvasc.  5. BPH-continue Avodart, Flomax.  6. GERD-continue prilosec.  7. Depression-On Zoloft.    DISCHARGE CONDITIONS:   Guarded  CONSULTS OBTAINED:   None  DRUG ALLERGIES:   Allergies  Allergen Reactions  . Cephalexin Rash   DISCHARGE MEDICATIONS:   Allergies as of 01/04/2018      Reactions   Cephalexin Rash      Medication List    STOP taking these  medications   atorvastatin 40 MG tablet Commonly known as:  LIPITOR   FIRST-DUKES MOUTHWASH Susp     TAKE these medications   albuterol 108 (90 Base) MCG/ACT inhaler Commonly known as:  PROVENTIL HFA;VENTOLIN HFA Inhale 1-2 puffs into the lungs every 4 (four) hours as needed for wheezing or shortness of breath.   amLODipine 5 MG tablet Commonly known as:  NORVASC Take 1 tablet (5 mg total) by mouth 2 (two) times daily.   cholecalciferol 1000 units tablet Commonly known as:  VITAMIN D Take 1,000 Units by mouth daily.   dutasteride 0.5 MG capsule Commonly known as:  AVODART Take 0.5 mg by mouth 2 (two) times a week.   fluticasone furoate-vilanterol 100-25 MCG/INH Aepb Commonly known as:  BREO ELLIPTA Inhale 1 puff into the lungs daily.   levofloxacin 500 MG tablet Commonly known as:  LEVAQUIN Take 1 tablet (500 mg total) by mouth daily for 7 days. X 7 days   MEGA MULTIVITAMIN FOR MEN Tabs Take 2 capsules by mouth daily.   omeprazole 20 MG capsule Commonly known as:  PRILOSEC Take 1 capsule (20 mg total) by mouth daily.   sertraline 50 MG tablet Commonly known as:  ZOLOFT Take 1 tablet (50 mg total) by mouth daily.   tamsulosin 0.4 MG Caps capsule Commonly known as:  FLOMAX Take 0.4 mg by mouth daily.   tiotropium 18 MCG inhalation capsule Commonly known as:  SPIRIVA Place 18 mcg into inhaler and inhale daily.   valACYclovir 500 MG tablet Commonly known as:  VALTREX Take 1 tablet (500 mg total) by mouth every other  day.        DISCHARGE INSTRUCTIONS:   1. PCP follow-up in 1-2 weeks  DIET:   Cardiac diet  ACTIVITY:   Activity as tolerated  OXYGEN:   Home Oxygen: No.  Oxygen Delivery: room air  DISCHARGE LOCATION:   home   If you experience worsening of your admission symptoms, develop shortness of breath, life threatening emergency, suicidal or homicidal thoughts you must seek medical attention immediately by calling 911 or calling your MD  immediately  if symptoms less severe.  You Must read complete instructions/literature along with all the possible adverse reactions/side effects for all the Medicines you take and that have been prescribed to you. Take any new Medicines after you have completely understood and accpet all the possible adverse reactions/side effects.   Please note  You were cared for by a hospitalist during your hospital stay. If you have any questions about your discharge medications or the care you received while you were in the hospital after you are discharged, you can call the unit and asked to speak with the hospitalist on call if the hospitalist that took care of you is not available. Once you are discharged, your primary care physician will handle any further medical issues. Please note that NO REFILLS for any discharge medications will be authorized once you are discharged, as it is imperative that you return to your primary care physician (or establish a relationship with a primary care physician if you do not have one) for your aftercare needs so that they can reassess your need for medications and monitor your lab values.    On the day of Discharge:  VITAL SIGNS:   Blood pressure (!) 131/49, pulse 63, temperature 98.2 F (36.8 C), temperature source Oral, resp. rate 20, height 5\' 8"  (1.727 m), weight 95.3 kg (210 lb), SpO2 97 %.  PHYSICAL EXAMINATION:    GENERAL:  84 y.o.-year-old patient lying in the bed with no acute distress. Appears very good for his age EYES: Pupils equal, round, reactive to light and accommodation. No scleral icterus. Extraocular muscles intact.  HEENT: Head atraumatic, normocephalic. Oropharynx and nasopharynx clear.  NECK:  Supple, no jugular venous distention. No thyroid enlargement, no tenderness.  LUNGS: Normal breath sounds bilaterally, no wheezing, rales,rhonchi or crepitation. No use of accessory muscles of respiration. Decreased bibasilar breath  sounds. CARDIOVASCULAR: S1, S2 normal. No murmurs, rubs, or gallops.  ABDOMEN: Soft, non-tender, non-distended. Bowel sounds present. No organomegaly or mass.  EXTREMITIES: No pedal edema, cyanosis, or clubbing.  NEUROLOGIC: Cranial nerves II through XII are intact. Muscle strength 5/5 in all extremities. Sensation intact. Gait not checked.  PSYCHIATRIC: The patient is alert and oriented x 3.  SKIN: No obvious rash, lesion, or ulcer.   DATA REVIEW:   CBC Recent Labs  Lab 01/04/18 0143  WBC 17.0*  HGB 11.8*  HCT 35.6*  PLT 203    Chemistries  Recent Labs  Lab 01/03/18 1500 01/04/18 0143  NA 138 137  K 4.0 3.6  CL 109 107  CO2 18* 20*  GLUCOSE 167* 109*  BUN 28* 30*  CREATININE 1.46* 1.36*  CALCIUM 9.2 8.7*  AST 31  --   ALT 23  --   ALKPHOS 72  --   BILITOT 0.4  --      Microbiology Results  Results for orders placed or performed during the hospital encounter of 01/03/18  Blood culture (routine x 2)     Status: None (Preliminary result)   Collection Time:  01/03/18  4:54 PM  Result Value Ref Range Status   Specimen Description BLOOD RIGHT ANTECUBITAL  Final   Special Requests   Final    BOTTLES DRAWN AEROBIC AND ANAEROBIC Blood Culture results may not be optimal due to an excessive volume of blood received in culture bottles   Culture   Final    NO GROWTH 2 DAYS Performed at [facility], [address], [place], [place] [address]    Report Status PENDING  Incomplete  Blood culture (routine x 2)     Status: None (Preliminary result)   Collection Time: 01/03/18  4:54 PM  Result Value Ref Range Status   Specimen Description BLOOD LEFT ANTECUBITAL  Final   Special Requests   Final    BOTTLES DRAWN AEROBIC AND ANAEROBIC Blood Culture results may not be optimal due to an excessive volume of blood received in culture bottles   Culture   Final    NO GROWTH 2 DAYS Performed at [facility], [address], [place], [place] [address]    Report  Status PENDING  Incomplete    RADIOLOGY:  No results found.   Management plans discussed with the patient, family and they are in agreement.  CODE STATUS:  Code Status History    Date Active Date Inactive Code Status Order ID Comments User Context   01/03/2018 19:49 01/04/2018 17:05 Full Code [id number]  Henreitta Leber, MD Inpatient    Advance Directive Documentation     Most Recent Value  Type of Advance Directive  Living will  Pre-existing out of facility DNR order (yellow form or pink MOST form)  No data  "MOST" Form in Place?  No data      TOTAL TIME TAKING CARE OF THIS PATIENT: 38 minutes.    Delford Wingert M.D on 01/05/2018 at 12:49 PM  Between 7am to 6pm - Pager - [phone]  After 6pm go to [email or web address] - [employer] [facility]  Office  [phone]  CC: Primary care physician; [name], MD   Note: This dictation was prepared with Dragon dictation along with smaller phrase technology. Any transcriptional errors that result from this process are unintentional.

## 2018-01-06 ENCOUNTER — Other Ambulatory Visit: Payer: Self-pay | Admitting: Student

## 2018-01-07 ENCOUNTER — Other Ambulatory Visit: Payer: Self-pay | Admitting: Student

## 2018-01-07 DIAGNOSIS — R1312 Dysphagia, oropharyngeal phase: Secondary | ICD-10-CM

## 2018-01-08 ENCOUNTER — Ambulatory Visit: Payer: Medicare HMO | Admitting: Family Medicine

## 2018-01-08 LAB — CULTURE, BLOOD (ROUTINE X 2)
CULTURE: NO GROWTH
Culture: NO GROWTH

## 2018-01-14 ENCOUNTER — Ambulatory Visit: Payer: Medicare HMO | Admitting: Family Medicine

## 2018-01-14 DIAGNOSIS — Z0289 Encounter for other administrative examinations: Secondary | ICD-10-CM

## 2018-01-15 ENCOUNTER — Encounter: Payer: Self-pay | Admitting: Pulmonary Disease

## 2018-01-15 ENCOUNTER — Ambulatory Visit
Admission: RE | Admit: 2018-01-15 | Discharge: 2018-01-15 | Disposition: A | Discharge status: Home / Self Care | Payer: Medicare HMO | Source: Ambulatory Visit | Attending: Pulmonary Disease | Admitting: Pulmonary Disease

## 2018-01-15 ENCOUNTER — Inpatient Hospital Stay: Admit: 2018-01-15 | Payer: Self-pay

## 2018-01-15 ENCOUNTER — Ambulatory Visit (INDEPENDENT_AMBULATORY_CARE_PROVIDER_SITE_OTHER): Payer: Medicare HMO | Admitting: Pulmonary Disease

## 2018-01-15 VITALS — BP 126/58 | HR 59 | Resp 16 | Ht 68.0 in | Wt 216.0 lb

## 2018-01-15 DIAGNOSIS — Z8701 Personal history of pneumonia (recurrent): Secondary | ICD-10-CM

## 2018-01-15 DIAGNOSIS — J449 Chronic obstructive pulmonary disease, unspecified: Secondary | ICD-10-CM

## 2018-01-15 DIAGNOSIS — R918 Other nonspecific abnormal finding of lung field: Secondary | ICD-10-CM

## 2018-01-15 DIAGNOSIS — R911 Solitary pulmonary nodule: Secondary | ICD-10-CM | POA: Diagnosis not present

## 2018-01-15 DIAGNOSIS — Z87891 Personal history of nicotine dependence: Secondary | ICD-10-CM | POA: Diagnosis not present

## 2018-01-15 DIAGNOSIS — J189 Pneumonia, unspecified organism: Secondary | ICD-10-CM | POA: Insufficient documentation

## 2018-01-15 NOTE — Patient Instructions (Signed)
Continue Spiriva inhaler I have refilled the prescription for albuterol inhaler to be used as needed for increased shortness of breath, chest tightness, wheezing We will have you back in 3 months after a repeat CT scan to look again at the lung nodule seen on your recent CT scan Follow up 3-4 months

## 2018-01-16 NOTE — Progress Notes (Signed)
PULMONARY OFFICE FOLLOW UP NOTE  Requesting MD/Service: self referred Date of initial consultation: 11/27/15 Reason for consultation: dyspnea, cough, former smoker, mild COPD  PT PROFILE: 84 y.o. M smoker of > 50 PYs previously seen by Dr [place] Bells and diagnosed with COPD. Self referred for eval of DOE and occasional cough productive of scant discolored mucus. PFTs 2014 revealed very mild obstruction  PROBLEMS: COPD - mild by PFTs 2014 Former smoker Class I dyspnea  DATA: 01/03/18 CT chest: Left greater than right dependent basilar opacities may indicate pneumonia and/or aspiration pneumonitis. Multiple enlarged mediastinal lymph nodes, measuring up to 2.4 cm, of uncertain etiology. 4 mm LUL nodule and medial right basilar pulmonary nodule measures 1.3 cm   INTERVAL HISTORY: Hospitalized 02/23-02/24/19 for PNA  SUBJ: Recent hospitalization reviewed. He has recovered fully. No new complaints. Remains on Spiriva as only controlled medication. Continues to rarely use albuterol.  Denies CP, fever, purulent sputum, hemoptysis, LE edema and calf tenderness.  OBJ: Vitals:   01/15/18 0915 01/15/18 0919  BP:  (!) 126/58  Pulse:  (!) 59  Resp: 16   SpO2:  97%  Weight: 98 kg (216 lb)   Height: 5\' 8"  (1.727 m)    EXAM:  Gen: NAD HEENT: WNL Neck: No JVD Lungs: mildly diffusely diminished BS, no wheezes or other adventitious sounds Cardiovascular: Reg, no murmurs Abdomen: NABS, soft Ext: without clubbing, cyanosis, edema Neuro: grossly intact  DATA: BMP Latest Ref Rng & Units 01/04/2018 01/03/2018 10/09/2017  Glucose 65 - 99 mg/dL 109(H) 167(H) 112(H)  BUN 6 - 20 mg/dL 30(H) 28(H) 21  Creatinine 0.61 - 1.24 mg/dL 1.36(H) 1.46(H) 1.15  Sodium 135 - 145 mmol/L 137 138 136  Potassium 3.5 - 5.1 mmol/L 3.6 4.0 4.8  Chloride 101 - 111 mmol/L 107 109 103  CO2 22 - 32 mmol/L 20(L) 18(L) 26  Calcium 8.9 - 10.3 mg/dL 8.7(L) 9.2 9.6    CBC Latest Ref Rng & Units 01/04/2018 01/03/2018  12/16/2013  WBC 3.8 - 10.6 K/uL 17.0(H) 24.1(H) 9.5  Hemoglobin 13.0 - 18.0 g/dL 11.8(L) 12.1(L) 12.4(L)  Hematocrit 40.0 - 52.0 % 35.6(L) 36.9(L) 38.5(L)  Platelets 150 - 440 K/uL 203 246 188.0    CXR: small L basilar (probable lingular) opacity  IMPRESSION: 1) Mild - moderate COPD. Little reversibility  2) Former  3) recent PNA - seems to be recovering well 4) Lung nodules - RLL and LUL. Found incidentally on CT chest   PLAN: Continue Spiriva inhaler Cont albuterol PRN - Rx refilled Repeat CT chest in 3 months with ROV in 3-4 months   Merton Border, MD PCCM service Mobile [phone] Pager [phone] 01/16/2018 8:57 PM

## 2018-01-23 ENCOUNTER — Telehealth: Payer: Self-pay | Admitting: Pulmonary Disease

## 2018-01-23 ENCOUNTER — Ambulatory Visit: Payer: Medicare HMO

## 2018-01-23 NOTE — Telephone Encounter (Signed)
Returned call to patient and made him aware: Pt verbalizes understanding, wanted to make sure someone would call him with CT Appt and f/u.  Continue Spiriva inhaler Cont albuterol PRN - Rx refilled Repeat CT chest in 3 months with ROV in 3-4 months

## 2018-01-23 NOTE — Telephone Encounter (Signed)
Pt calling stating about two weeks ago he was told that since we found a white spot on his lungs He was told that we would be seen him back in about 4 weeks  He is asking if this was still needed   Please advise

## 2018-02-05 ENCOUNTER — Telehealth: Payer: Self-pay | Admitting: Family Medicine

## 2018-02-05 NOTE — Telephone Encounter (Signed)
Pt came in and wanted to speak to Dr. Caryl Bis or a call from him in regards to an  appts that he had recently with Dr.Simonds. To get his thoughts on it.. Please advise

## 2018-02-05 NOTE — Telephone Encounter (Signed)
Patient states he saw ENT and they cut a growth out of the back of his throat which helped with his "gagging" problem. Patient states the biopsy showed some abnormal cells. He was informed that it may be cancerous. He saw Dr.Simmons and was told to come back on 3 months. He eels that 3 months is a little long to follow up. He is concerned about having to wait this long and he would lie to know if he should follow up sooner.

## 2018-02-05 NOTE — Telephone Encounter (Signed)
He needs to discuss this with his ear nose and throat physician to see when he needs to follow-up with them and see if he needs to be referred to an oncologist if he had cancerous cells from the growth that they removed from his throat.  I have not seen any records from them and we can request those for me to review though he should contact his ear nose and throat physician to determine follow-up with them.  Dr. Alva Garnet is a pulmonologist and from the pulmonary perspective it would seem reasonable to follow-up at 3 months based on Dr. Alva Garnet recommendations and the CT recommendations.  Thanks.

## 2018-02-06 NOTE — Telephone Encounter (Signed)
Informed patient he would need to follow up with his ent for recommedations and patient states "I aint worried about the ear nose and throat doctor, I am worried about the cancer" I informed patient that is what he would be following up with ent about. Patient hung up

## 2018-02-10 ENCOUNTER — Telehealth: Payer: Self-pay | Admitting: Pulmonary Disease

## 2018-02-10 NOTE — Telephone Encounter (Signed)
Informed pt per DS note that pt is suppose to have CT scan prior to 3month f/u. Nothing further needed.

## 2018-02-10 NOTE — Telephone Encounter (Signed)
Please call regarding scheduling "lung scan".

## 2018-03-02 ENCOUNTER — Ambulatory Visit (INDEPENDENT_AMBULATORY_CARE_PROVIDER_SITE_OTHER): Payer: Medicare HMO | Admitting: Family Medicine

## 2018-03-02 ENCOUNTER — Other Ambulatory Visit: Payer: Self-pay

## 2018-03-02 ENCOUNTER — Ambulatory Visit: Payer: Self-pay | Admitting: *Deleted

## 2018-03-02 ENCOUNTER — Encounter: Payer: Self-pay | Admitting: Family Medicine

## 2018-03-02 VITALS — BP 152/68 | HR 79 | Temp 97.7°F | Wt 219.6 lb

## 2018-03-02 DIAGNOSIS — E119 Type 2 diabetes mellitus without complications: Secondary | ICD-10-CM | POA: Diagnosis not present

## 2018-03-02 DIAGNOSIS — M21372 Foot drop, left foot: Secondary | ICD-10-CM | POA: Diagnosis not present

## 2018-03-02 DIAGNOSIS — R21 Rash and other nonspecific skin eruption: Secondary | ICD-10-CM | POA: Diagnosis not present

## 2018-03-02 DIAGNOSIS — M48061 Spinal stenosis, lumbar region without neurogenic claudication: Secondary | ICD-10-CM

## 2018-03-02 DIAGNOSIS — W19XXXA Unspecified fall, initial encounter: Secondary | ICD-10-CM

## 2018-03-02 DIAGNOSIS — J449 Chronic obstructive pulmonary disease, unspecified: Secondary | ICD-10-CM

## 2018-03-02 LAB — COMPREHENSIVE METABOLIC PANEL
ALT: 24 U/L (ref 0–53)
AST: 24 U/L (ref 0–37)
Albumin: 4.3 g/dL (ref 3.5–5.2)
Alkaline Phosphatase: 80 U/L (ref 39–117)
BUN: 22 mg/dL (ref 6–23)
CHLORIDE: 102 meq/L (ref 96–112)
CO2: 26 meq/L (ref 19–32)
Calcium: 9.7 mg/dL (ref 8.4–10.5)
Creatinine, Ser: 1.3 mg/dL (ref 0.40–1.50)
GFR: 56 mL/min — ABNORMAL LOW (ref >60.00)
GLUCOSE: 99 mg/dL (ref 70–99)
POTASSIUM: 4.8 meq/L (ref 3.5–5.1)
SODIUM: 137 meq/L (ref 135–145)
Total Bilirubin: 0.3 mg/dL (ref 0.2–1.2)
Total Protein: 7.2 g/dL (ref 6.0–8.3)

## 2018-03-02 LAB — HEMOGLOBIN A1C: HEMOGLOBIN A1C: 6.6 % — AB (ref 4.6–6.5)

## 2018-03-02 MED ORDER — ALBUTEROL SULFATE HFA 108 (90 BASE) MCG/ACT IN AERS: 1.0000 | INHALATION_SPRAY | RESPIRATORY_TRACT | 10 refills | Status: DC | PRN

## 2018-03-02 MED ORDER — KETOCONAZOLE 2 % EX CREA: 1.0000 "application " | TOPICAL_CREAM | Freq: Every day | CUTANEOUS | 0 refills | Status: DC

## 2018-03-02 NOTE — Patient Instructions (Signed)
Nice to see you. We will get you to see physical therapy and see Dr. Sharlet Salina. If you develop further weakness in your leg or you develop new symptoms such as numbness, loss of bowel or bladder function, worsening back pain, lightheadedness, or any new or changing symptoms please seek medical attention immediately.

## 2018-03-02 NOTE — Telephone Encounter (Signed)
Pt's wife called because he husband keeps falling. He felled on Friday and has cuts and bruises on both arms from his elbows to his wrists. He hit the side of a trailer when he fell. She had cleaned and used an antibacterial ointment for the cuts. She is requesting an appointment for him. Appointment made for today with his pcp. Home care advice given to pt's wife with verbal understanding.  Reason for Disposition . [1] Last tetanus shot > 10 years ago AND [2] CLEAN cut  Answer Assessment - Initial Assessment Questions 1. APPEARANCE of INJURY: "What does the injury look like?"      Bruises and cuts 2. SIZE: "How large is the cut?"      Large form elbows to the wrists both arms 3. BLEEDING: "Is it bleeding now?" If so, ask: "Is it difficult to stop?"      no 4. LOCATION: "Where is the injury located?"      Both arms 5. ONSET: "How long ago did the injury occur?"      This happened on Friday 6. MECHANISM: "Tell me how it happened."       [place] outside and hit the side of the trailer 7. TETANUS: "When was the last tetanus booster?"     Not sure 8. PREGNANCY: "Is there any chance you are pregnant?" "When was your last menstrual period?"     no  Protocols used: [place]

## 2018-03-04 ENCOUNTER — Other Ambulatory Visit: Payer: Self-pay

## 2018-03-04 ENCOUNTER — Ambulatory Visit: Payer: Medicare HMO | Attending: Family Medicine

## 2018-03-04 DIAGNOSIS — M545 Low back pain: Secondary | ICD-10-CM | POA: Insufficient documentation

## 2018-03-04 DIAGNOSIS — W19XXXA Unspecified fall, initial encounter: Secondary | ICD-10-CM | POA: Insufficient documentation

## 2018-03-04 DIAGNOSIS — R2681 Unsteadiness on feet: Secondary | ICD-10-CM | POA: Diagnosis present

## 2018-03-04 DIAGNOSIS — M6281 Muscle weakness (generalized): Secondary | ICD-10-CM | POA: Insufficient documentation

## 2018-03-04 DIAGNOSIS — G8929 Other chronic pain: Secondary | ICD-10-CM | POA: Diagnosis present

## 2018-03-04 DIAGNOSIS — Z9181 History of falling: Secondary | ICD-10-CM | POA: Insufficient documentation

## 2018-03-04 DIAGNOSIS — R21 Rash and other nonspecific skin eruption: Secondary | ICD-10-CM | POA: Insufficient documentation

## 2018-03-04 NOTE — Progress Notes (Signed)
Tommi Rumps, MD Phone: [phone]  Donald [name] is a 84 y.o. male who presents today for follow-up.  Falls: Patient reports he has had several falls since his left leg started to bother him late last year.  Most recently was last week.  He just feels clumsy.  He notes no weakness or numbness.  He is not walking with a cane or a walker.  He fell and landed on his elbows.  No head injury or syncope.  He does have abrasions and skin tears on his elbows.  This was 3 days prior to this visit.  He does not get dizzy or lightheaded.  He has no issues with his gait.  He reports chronic left leg weakness and has had an MRI of his lumbar spine to evaluate this.  He has followed with physical medicine and rehab.  He is not interested in surgery.  He notes a rash on his trunk starting 1 to 2 months ago.  Started out as several spots though has spread.  He notes one new soap.  No new detergents.  No itching.  No fevers.  He did have pneumonia shortly before the rash started.  No contacts with a similar rash  Dyspnea on exertion has been stable and is related to his COPD.  He continues on his Spiriva.  He continues to see pulmonology.  Social History   Tobacco Use  Smoking Status Former Smoker  . Packs/day: 1.50  . Years: 50.00  . Pack years: 75.00  . Types: Cigarettes  . Last attempt to quit: 09/10/1999  . Years since quitting: 18.4  Smokeless Tobacco Never Used     ROS see history of present illness  Objective  Physical Exam Vitals:   03/02/18 0937  BP: (!) 152/68  Pulse: 79  Temp: 97.7 F (36.5 C)  SpO2: 97%    BP Readings from Last 3 Encounters:  03/02/18 (!) 152/68  01/15/18 (!) 126/58  01/04/18 (!) 131/49   Wt Readings from Last 3 Encounters:  03/02/18 219 lb 9.6 oz (99.6 kg)  01/15/18 216 lb (98 kg)  01/03/18 210 lb (95.3 kg)    Physical Exam  Constitutional: No distress.  Cardiovascular: Normal rate, regular rhythm and normal heart sounds.  Pulmonary/Chest:  Effort normal and breath sounds normal.  Musculoskeletal: He exhibits no edema.  Neurological: He is alert.  CN 2-12 intact, 4+/5 left dorsiflexion, otherwise 5/5 strength in  right dorsiflexion, bilateral biceps, triceps, grip, quads, hamstrings, plantar flexion, sensation to light touch intact in bilateral UE and LE, on 2-3 steps of gait he appears to have left drop foot, though other steps were normal  Skin: Skin is warm and dry. He is not diaphoretic.  Patient with scattered flaking erythematous plaques noted over his trunk Abrasions with bruising and skin tears noted over his bilateral elbows, no bony tenderness or defects, full range of motion bilateral elbows, slight bleeding from the skin tears  Assessment/Plan: Please see individual problem list.  Chronic obstructive pulmonary disease ([place]) Seems to be stable.  He will continue his current regimen.  He will continue to follow with pulmonology.  Fall Patient with intermittent falls over a number of months.  Possibly related to his left leg pain and weakness.  He does possibly have some left foot drop and weakness on left foot dorsiflexion.  He has had an MRI previously to evaluate this.  He does not want surgery.  He opted to return to physical medicine and rehab to see  what they had to offer.  Referral was placed back to them.  Physical therapy referral placed as well.  He was given return precautions.  Rash Possibly pityriasis rosea versus tinea corporis.  Discussed the benign nature of pityriasis rosea.  We will give him a trial of ketoconazole cream to try on a couple of the lesions to see if they improve with that.  If so he may need an oral antifungal.  Diabetes mellitus without complication (HCC) A0O checked.   Orders Placed This Encounter  Procedures  . HgB A1c  . Comp Met (CMET)  . Ambulatory referral to Physical Therapy    Referral Priority:   Routine    Referral Type:   Physical Medicine    Referral Reason:    Specialty Services Required    Requested Specialty:   Physical Therapy    Number of Visits Requested:   1  . Ambulatory referral to Physical Medicine Rehab    Referral Priority:   Routine    Referral Type:   Rehabilitation    Referral Reason:   Specialty Services Required    Requested Specialty:   Physical Medicine and Rehabilitation    Number of Visits Requested:   1    Meds ordered this encounter  Medications  . ketoconazole (NIZORAL) 2 % cream    Sig: Apply 1 application topically daily. To 2-3 skin lesions    Dispense:  15 g    Refill:  0  . albuterol (PROVENTIL HFA;VENTOLIN HFA) 108 (90 Base) MCG/ACT inhaler    Sig: Inhale 1-2 puffs into the lungs every 4 (four) hours as needed for wheezing or shortness of breath.    Dispense:  1 Inhaler    Refill:  [place], MD [place]

## 2018-03-04 NOTE — Assessment & Plan Note (Signed)
Possibly pityriasis rosea versus tinea corporis.  Discussed the benign nature of pityriasis rosea.  We will give him a trial of ketoconazole cream to try on a couple of the lesions to see if they improve with that.  If so he may need an oral antifungal.

## 2018-03-04 NOTE — Assessment & Plan Note (Signed)
Patient with intermittent falls over a number of months.  Possibly related to his left leg pain and weakness.  He does possibly have some left foot drop and weakness on left foot dorsiflexion.  He has had an MRI previously to evaluate this.  He does not want surgery.  He opted to return to physical medicine and rehab to see what they had to offer.  Referral was placed back to them.  Physical therapy referral placed as well.  He was given return precautions.

## 2018-03-04 NOTE — Therapy (Signed)
[facility] [address] [place], [address] Phone: [phone]   Fax:  [phone]  Physical Therapy Evaluation  Patient Details  Name: Donald [name] MRN: [id number] Date of Birth: August 03, 1933 Referring Provider: Tommi Rumps, MD   Encounter Date: 03/04/2018  PT End of Session - 03/04/18 1439    Visit Number  1    Number of Visits  13    Date for PT Re-Evaluation  04/16/18    PT Start Time  4010    PT Stop Time  1531    PT Time Calculation (min)  52 min    Equipment Utilized During Treatment  Gait belt    Activity Tolerance  Patient tolerated treatment well    Behavior During Therapy  [facility] for tasks assessed/performed       Past Medical History:  Diagnosis Date  . Arthritis   . Esophageal reflux   . Hyperlipidemia   . Hypertension   . Sebaceous cyst     Past Surgical History:  Procedure Laterality Date  . HEMORRHOID SURGERY      There were no vitals filed for this visit.   Subjective Assessment - 03/04/18 1441    Subjective  Back pain: 0/10 currently (pt currently sitting, and no LE symptoms), 8/10 back pain at worst for the past 2 months (no LE symptoms, pt was walking)    Pertinent History  Falls. Pt states falling 5 times in the last 2 months. Pt has a cane and a rw but they are his wife's. Pt states having his own personal SPC at home.  Pt states he trips over his own feet. Pt was going down steps and his R foot hit the bottom step, got off balance and fell, injuring both forearms. Did not break any bones but lost a lot of skin.  Pt was also stepping to the R side while at church to let someone out at the pews and his feet somehow got tangled up and pt fell.   Pt states his falls are usually due to his feet getting tangled up.  Pt started falling about 3 months ago.  Pt woke up one morning with L LE (ankle, shin, knee, and hip hurt). Pt woke up on his R side. Pt states feeling unsteady  since then. Does not feel light headed.  Pt also states having back pain.  Had x-rays and MRI for his back about 3 months ago. Pt was recommended to go to the pain clinic to get shots for his back.  Denies incontinence or saddle anesthesia. Pt however states feeling L LE electrical impulse which makes his L great toe feel like its dancing. the electrical feeling occasionally goes down his R LE towards his pinky toe but not as much as his L LE.     Patient Stated Goals  Pt expresses desire to get better.     Currently in Pain?  No/denies    Pain Score  0-No pain    Pain Location  Back    Pain Type  Chronic pain    Pain Onset  More than a month ago    Pain Frequency  Occasional    Aggravating Factors   balance: stepping to the side, stepping down steps. Pt states tripping on his foot most of the time when he loses balance. Back: walking about 100 yards (300 ft), using a hammer, repetitive motion with weight on his hand.   Electrical sensation bilateral  LE: pt does not know cause.     Pain Relieving Factors  back: getting in his recliner.          [facility] PT Assessment - 03/04/18 1441      Assessment   Medical Diagnosis  Fall, initial encounter    Referring Provider  Tommi Rumps, MD    Onset Date/Surgical Date  11/11/17 Falls began 3 months ago, no specific date provided    Prior Therapy  No known PT for current condition      Precautions   Precaution Comments  fall risk      Restrictions   Other Position/Activity Restrictions  no known weight bearing restrictions      Balance Screen   Has the patient fallen in the past 6 months  Yes    How many times?  5 in the last 2 months      Home Environment   Additional Comments  Lives in a 1 story home with his wife. 5 steps to enter with bilateral rail.       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       Observation/Other Assessments   Observations  TUG without AD: 13  seconds, 11 seconds, 11 seconds (11.67 seconds average)    Focus on Therapeutic Outcomes (FOTO)   43    Lower Extremity Functional Scale   40/80      Posture/Postural Control   Posture Comments  R lateral shift, decreased lordosis, decreased bilateral hip extension, bilaterally protracted shoulders and neck, wallet in L back pocket. Pt was recommended to place it in his front pocket      AROM   Lumbar Flexion  WFL with R low back pain    Lumbar Extension  limited    Lumbar - Right Side [name] with R low back pain    Lumbar - Left Side [facility] with L low back pain, not as much as with R side banding    Lumbar - Right Rotation  limited with L thoracic pulling performed in sitting    Lumbar - Left Rotation  limited with L thoracic pulling performed in sitting      Strength   Right Hip Flexion  4/5    Right Hip Extension  4/5    Right Hip ABduction  4+/5 seated manually resisted clamshell isometrics    Left Hip Flexion  4-/5    Left Hip Extension  3+/5 seated manually resisted leg press    Left Hip ABduction  4+/5 seated manually resisted clamshell isometrics    Right Knee Flexion  4-/5 with hamstring cramp    Right Knee Extension  5/5    Left Knee Flexion  4/5    Left Knee Extension  5/5    Right Ankle Dorsiflexion  4/5    Left Ankle Dorsiflexion  3/5      Ambulation/Gait   Gait Comments  decreased stance R LE with R lateral lean during R LE stance phase, decreased bilateral hip extension, forward flexed.                 Objective measurements completed on examination: See above findings.  Fall, initial encounter  [name], MD  03/02/2018 Pt states blood pressure is controlled.    Pt also states that he does silver sneakers     Check long sit test next visit if appropriate.    Patient is  an 84 year old male who came to physical therapy secondary to falls. He also presents with altered gait pattern and posture, back pain with reproduction of symptoms  with flexion, side bending and rotation; bilateral hip and L ankle DF weakness, and difficulty performing functional tasks such as going down steps, stepping sideways and walking longer distances due to balance difficulties and back pain.  Pt will benefit from skilled physical therapy services to address the aforementioned deficits.     PT Education - 03/04/18 2004    Education provided  Yes    Education Details  plan of care    Person(s) Educated  Patient    Methods  Explanation    Comprehension  Verbalized understanding          PT Long Term Goals - 03/04/18 1945      PT LONG TERM GOAL #1   Title  Patient will improve bilateral hip strength by at least 1/2 MMT grade to promote ability to perform standing activities with decreased fall risk and decreased back pain.     Time  6    Period  Weeks    Status  New    Target Date  04/16/18      PT LONG TERM GOAL #2   Title  Patient will improve his LE FOTO score by at least 10 points as a demontration of improved function.     Baseline  LE FOTO 43 (03/04/2018)    Time  6    Period  Weeks    Status  New    Target Date  04/16/18      PT LONG TERM GOAL #3   Title  Pt will improve his LEFS score by at least 9 points as a demonstration of improved function.    Baseline  40/80 (03/04/2018)    Time  6    Period  Weeks    Status  New    Target Date  04/16/18      PT LONG TERM GOAL #4   Title  Pt will report no falls for 3 weeks to promote ability to perform standing tasks more safely.     Baseline  Pt states falling 5 times in 2 months (03/04/2018)    Time  6    Period  Weeks    Status  New    Target Date  04/16/18      PT LONG TERM GOAL #5   Title  Patient will have a decrease in back pain to 4/10 or less at worst to promote ability to perform standing tasks and decrease fall risk.     Baseline  8/10 at worst for the past 2 months (03/04/2018)    Time  6    Period  Weeks    Status  New    Target Date  04/16/18              Plan - 03/04/18 1934    Clinical Impression Statement  Patient is an 84 year old male who came to physical therapy secondary to falls. He also presents with altered gait pattern and posture, back pain with reproduction of symptoms with flexion, side bending and rotation; bilateral hip and L ankle DF weakness, and difficulty performing functional tasks such as going down steps, stepping sideways and walking longer distances due to balance difficulties and back pain.  Pt will benefit from skilled physical therapy services to address the aforementioned deficits.     History and Personal Factors relevant  to plan of care:  hx of falls, back pain, weakness, difficulty going down steps, walking sideways, walking longer distances    Clinical Presentation  Evolving    Clinical Presentation due to:  fall frequency seems to have increased since onset    Clinical Decision Making  Moderate    Rehab Potential  Fair    Clinical Impairments Affecting Rehab Potential  age, chronicity of back pain, increased falls    PT Frequency  2x / week    PT Duration  6 weeks    PT Treatment/Interventions  Therapeutic exercise;Balance training;Therapeutic activities;Aquatic Therapy;Electrical Stimulation;Iontophoresis 4mg /ml Dexamethasone;Traction;Ultrasound;Gait training;Neuromuscular re-education;Patient/family education;Manual techniques;Dry needling traction if appropriate    PT Next Visit Plan  hip, trunk, scapular strengthening, balance training, manual techniques, modalities PRN    Consulted and Agree with Plan of Care  Patient       Patient will benefit from skilled therapeutic intervention in order to improve the following deficits and impairments:  Pain, Improper body mechanics, Postural dysfunction, Difficulty walking, Decreased strength, Decreased balance  Visit Diagnosis: History of falling - Plan: PT plan of care cert/re-cert  Unsteadiness on feet - Plan: PT plan of care  cert/re-cert  Chronic low back pain, unspecified back pain laterality, with sciatica presence unspecified - Plan: PT plan of care cert/re-cert  Muscle weakness (generalized) - Plan: PT plan of care cert/re-cert     Problem List Patient Active Problem List   Diagnosis Date Noted  . Fall 03/04/2018  . Rash 03/04/2018  . Pneumonia 01/03/2018  . Mouth lesion 12/23/2017  . Hoarseness 12/23/2017  . Diabetes mellitus without complication ([place]) 89/21/1941  . Chronic back pain 03/05/2017  . Pain in joint involving left lower leg 08/26/2016  . GERD (gastroesophageal reflux disease) 08/05/2016  . 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 Obesity (BMI 30-39.9) 06/20/2014  . Screening for abdominal aortic aneurysm 03/18/2014  . Dermatitis 02/22/2014  . COPD exacerbation ([place]) 10/12/2013  . Chronic obstructive pulmonary disease ([place]) 10/12/2013  . Bradycardia 08/31/2013  . History of smoking 08/11/2013  . Personal history of tobacco use, presenting hazards to health 08/11/2013  . Dysphagia, pharyngoesophageal phase 05/05/2013  . 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 Chronic prostatitis 09/11/2012  . Elevated prostate specific antigen (PSA) 09/11/2012  . 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 ED (erectile dysfunction) of organic origin 09/11/2012  . Incomplete emptying of bladder 09/11/2012  . Nodular prostate with urinary obstruction 09/11/2012  . Testicular hypofunction 09/11/2012  . Urge incontinence 09/11/2012  . Posttraumatic stress disorder 12/23/2011  . 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 Essential hypertension 09/10/2011    Joneen Boers PT, DPT   03/04/2018, 8:07 PM  [facility] [address], [place], [address] Phone: [phone]   Fax:  [phone]  Name: Donald [name] MRN: [id number] Date of Birth: 1933-06-06

## 2018-03-04 NOTE — Assessment & Plan Note (Signed)
Seems to be stable.  He will continue his current regimen.  He will continue to follow with pulmonology.

## 2018-03-04 NOTE — Assessment & Plan Note (Signed)
A1c checked.

## 2018-03-05 ENCOUNTER — Ambulatory Visit: Payer: Medicare HMO

## 2018-03-06 ENCOUNTER — Telehealth: Payer: Self-pay

## 2018-03-06 NOTE — Telephone Encounter (Signed)
mychart message was sent by provider

## 2018-03-06 NOTE — Telephone Encounter (Signed)
I tried to call patient and was not able to leave a voicemial  Copied from [id number]. Topic: Quick Communication - Lab Results >> Mar 06, 2018 11:53 AM Marin Olp L wrote: Notified patient regarding lab results that Dr. Caryl Bis or cma will call  him about his 04/22 labs once they have been finalized.

## 2018-03-09 ENCOUNTER — Telehealth: Payer: Self-pay

## 2018-03-09 DIAGNOSIS — R21 Rash and other nonspecific skin eruption: Secondary | ICD-10-CM

## 2018-03-09 NOTE — Telephone Encounter (Signed)
Patient given lab results and verbalized understanding of labs patient also states that Dr. Caryl Bis gave him something for his rash but it is not working would like something else called in .

## 2018-03-09 NOTE — Telephone Encounter (Signed)
Please find out if he's developed worsening rash or any new symptoms. He will need to see dermatology for further evaluation though I may be able to send in an alternative medication depending on if things have stayed the same.

## 2018-03-09 NOTE — Telephone Encounter (Signed)
Referral placed.

## 2018-03-09 NOTE — Addendum Note (Signed)
Addended by: [name] on: 03/09/2018 06:14 PM   Modules accepted: Orders

## 2018-03-09 NOTE — Telephone Encounter (Signed)
Patient states it has not worsened but he would like to see dermatology Dr.Eisenstein

## 2018-03-09 NOTE — Telephone Encounter (Signed)
Please advise 

## 2018-03-10 ENCOUNTER — Ambulatory Visit: Payer: Medicare HMO

## 2018-03-11 ENCOUNTER — Ambulatory Visit: Payer: Medicare HMO | Attending: Family Medicine

## 2018-03-11 ENCOUNTER — Ambulatory Visit: Payer: Self-pay

## 2018-03-11 DIAGNOSIS — G8929 Other chronic pain: Secondary | ICD-10-CM | POA: Diagnosis present

## 2018-03-11 DIAGNOSIS — M6281 Muscle weakness (generalized): Secondary | ICD-10-CM | POA: Diagnosis present

## 2018-03-11 DIAGNOSIS — Z9181 History of falling: Secondary | ICD-10-CM | POA: Diagnosis present

## 2018-03-11 DIAGNOSIS — M545 Low back pain: Secondary | ICD-10-CM | POA: Insufficient documentation

## 2018-03-11 DIAGNOSIS — R2681 Unsteadiness on feet: Secondary | ICD-10-CM | POA: Diagnosis present

## 2018-03-11 NOTE — Telephone Encounter (Signed)
Noted.  If he had any injury he should be evaluated though it does not sound like he had an injury.  We discussed having him see physical medicine and rehab for reevaluation.  Discussed referring him to a surgeon given his back issues and left leg issues though he deferred this.  We could certainly refer him to a neurologist to see if they have any thoughts.

## 2018-03-11 NOTE — Patient Instructions (Addendum)
Seated hip extension isometrics   Sitting on a chair,    Squeeze your rear end muscles together and press your L foot onto the floor.     Hold for 5 seconds    Repeat 10 times   Perform 3 sets daily.      This is a corrective exercise. Once you no longer have symptoms, you can stop.        Medbridge Access Code: [email or web address]   Seated Shoulder Extension and Scapular Retraction with Resistance  Green band, 10x3 with 5 second holds

## 2018-03-11 NOTE — Telephone Encounter (Signed)
Pt.'s wife called reports he "fell again today.He was coming through the front door and just fell forward." Wife reports "he didn't hurt anything. I checked him out all over." Wife states he has been to rehab. She wants to know if Dr. Caryl Bis thinks he should see a "specialist to get to the bottom of this. Why does he keep falling?"

## 2018-03-11 NOTE — Therapy (Signed)
[facility] [address] [place], [address] Phone: [phone]   Fax:  [phone]  Physical Therapy Treatment  Patient Details  Name: Donald [name] MRN: [id number] Date of Birth: February 15, 1933 Referring Provider: Tommi Rumps, MD   Encounter Date: 03/11/2018  PT End of Session - 03/11/18 1433    Visit Number  2    Number of Visits  13    Date for PT Re-Evaluation  04/16/18    PT Start Time  3154    PT Stop Time  1513    PT Time Calculation (min)  40 min    Equipment Utilized During Treatment  Gait belt    Activity Tolerance  Patient tolerated treatment well    Behavior During Therapy  [facility] for tasks assessed/performed       Past Medical History:  Diagnosis Date  . Arthritis   . Esophageal reflux   . Hyperlipidemia   . Hypertension   . Sebaceous cyst     Past Surgical History:  Procedure Laterality Date  . HEMORRHOID SURGERY      There were no vitals filed for this visit.  Subjective Assessment - 03/11/18 1435    Subjective  Pt fell today. Pt was carrying groceries into the house. Seems like he tells the R foot to move to the side but it does not move and pt falls. No back pain currently.     Pertinent History  Falls. Pt states falling 5 times in the last 2 months. Pt has a cane and a rw but they are his wife's. Pt states having his own personal SPC at home.  Pt states he trips over his own feet. Pt was going down steps and his R foot hit the bottom step, got off balance and fell, injuring both forearms. Did not break any bones but lost a lot of skin.  Pt was also stepping to the R side while at church to let someone out at the pews and his feet somehow got tangled up and pt fell.   Pt states his falls are usually due to his feet getting tangled up.  Pt started falling about 3 months ago.  Pt woke up one morning with L LE (ankle, shin, knee, and hip hurt). Pt woke up on his R side. Pt states  feeling unsteady since then. Does not feel light headed.  Pt also states having back pain.  Had x-rays and MRI for his back about 3 months ago. Pt was recommended to go to the pain clinic to get shots for his back.  Denies incontinence or saddle anesthesia. Pt however states feeling L LE electrical impulse which makes his L great toe feel like its dancing. the electrical feeling occasionally goes down his R LE towards his pinky toe but not as much as his L LE.     Patient Stated Goals  Pt expresses desire to get better.     Currently in Pain?  No/denies    Pain Onset  More than a month ago                               PT Education - 03/11/18 1445    Education provided  Yes    Education Details  ther-ex, HEP    Person(s) Educated  Patient    Methods  Explanation;Demonstration;Tactile cues;Verbal cues;Handout    Comprehension  Returned demonstration;Verbalized understanding  Objectives Medbridge Access Code: [email or web address]    Pt states blood pressure is controlled.   Pt also states that he does silver sneakers   Pt states L low back bothers him more than R side  Supine positon: L anterior pelvic rotation posture  Pt states that his L leg does what he wants it to do, his R LE does not.   Therapeutic exercise  Supine position: L LE longer  Seated L hip extension isometrics 10x5 seconds for 3 sets. Reviewed and given as part of his HEP. Pt demonstrated and verbalized understanding.   Standing L LE leg press resisting blue band 10x3   seated rows to promote scapular retraction and thoracic extension green band 10x3  Reviewed HEP. Pt demonstrated and verbalized understanding.   Seated bilateral shoulder extension resisting green band 10x5 seconds for 3 sets to promote trunk muscle activation  Standing alternating toe taps onto treadmill platform with light touch assist to promote balance 10x, CGA  Then no UE assist 10x CGA   Improved exercise  technique, movement at target joints, use of target muscles after mod verbal, visual, tactile cues.    Worked on improving L glute max strengthening as well as trunk strengthening and thoracic extension to promote better lumbopelvic positioning, and decreasing pressure to low back to hopefully improve ability to use R LE and improve balance. Pt tolerated session well without aggravation of symptoms.           PT Long Term Goals - 03/04/18 1945      PT LONG TERM GOAL #1   Title  Patient will improve bilateral hip strength by at least 1/2 MMT grade to promote ability to perform standing activities with decreased fall risk and decreased back pain.     Time  6    Period  Weeks    Status  New    Target Date  04/16/18      PT LONG TERM GOAL #2   Title  Patient will improve his LE FOTO score by at least 10 points as a demontration of improved function.     Baseline  LE FOTO 43 (03/04/2018)    Time  6    Period  Weeks    Status  New    Target Date  04/16/18      PT LONG TERM GOAL #3   Title  Pt will improve his LEFS score by at least 9 points as a demonstration of improved function.    Baseline  40/80 (03/04/2018)    Time  6    Period  Weeks    Status  New    Target Date  04/16/18      PT LONG TERM GOAL #4   Title  Pt will report no falls for 3 weeks to promote ability to perform standing tasks more safely.     Baseline  Pt states falling 5 times in 2 months (03/04/2018)    Time  6    Period  Weeks    Status  New    Target Date  04/16/18      PT LONG TERM GOAL #5   Title  Patient will have a decrease in back pain to 4/10 or less at worst to promote ability to perform standing tasks and decrease fall risk.     Baseline  8/10 at worst for the past 2 months (03/04/2018)    Time  6    Period  Weeks    Status  New  Target Date  04/16/18            Plan - 03/11/18 1813    Clinical Impression Statement  Worked on improving L glute max strengthening as well as trunk  strengthening and thoracic extension to promote better lumbopelvic positioning, and decreasing pressure to low back to hopefully improve ability to use R LE and improve balance. Pt tolerated session well without aggravation of symptoms.     Rehab Potential  Fair    Clinical Impairments Affecting Rehab Potential  age, chronicity of back pain, increased falls    PT Frequency  2x / week    PT Duration  6 weeks    PT Treatment/Interventions  Therapeutic exercise;Balance training;Therapeutic activities;Aquatic Therapy;Electrical Stimulation;Iontophoresis 4mg /ml Dexamethasone;Traction;Ultrasound;Gait training;Neuromuscular re-education;Patient/family education;Manual techniques;Dry needling traction if appropriate    PT Next Visit Plan  hip, trunk, scapular strengthening, balance training, manual techniques, modalities PRN    Consulted and Agree with Plan of Care  Patient       Patient will benefit from skilled therapeutic intervention in order to improve the following deficits and impairments:  Pain, Improper body mechanics, Postural dysfunction, Difficulty walking, Decreased strength, Decreased balance  Visit Diagnosis: History of falling  Unsteadiness on feet  Chronic low back pain, unspecified back pain laterality, with sciatica presence unspecified  Muscle weakness (generalized)     Problem List Patient Active Problem List   Diagnosis Date Noted  . Fall 03/04/2018  . Rash 03/04/2018  . Pneumonia 01/03/2018  . Mouth lesion 12/23/2017  . Hoarseness 12/23/2017  . Diabetes mellitus without complication ([place]) 21/97/5883  . Chronic back pain 03/05/2017  . Pain in joint involving left lower leg 08/26/2016  . GERD (gastroesophageal reflux disease) 08/05/2016  . 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 Obesity (BMI 30-39.9) 06/20/2014  . Screening for abdominal aortic aneurysm 03/18/2014  . Dermatitis 02/22/2014  . COPD exacerbation ([place]) 10/12/2013  . Chronic obstructive pulmonary disease ([place])  10/12/2013  . Bradycardia 08/31/2013  . History of smoking 08/11/2013  . Personal history of tobacco use, presenting hazards to health 08/11/2013  . Dysphagia, pharyngoesophageal phase 05/05/2013  . 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 Chronic prostatitis 09/11/2012  . Elevated prostate specific antigen (PSA) 09/11/2012  . 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 ED (erectile dysfunction) of organic origin 09/11/2012  . Incomplete emptying of bladder 09/11/2012  . Nodular prostate with urinary obstruction 09/11/2012  . Testicular hypofunction 09/11/2012  . Urge incontinence 09/11/2012  . Posttraumatic stress disorder 12/23/2011  . 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 Essential hypertension 09/10/2011    Joneen Boers PT, DPT   03/11/2018, 6:21 PM  [facility] [address], [place], [address] Phone: [phone]   Fax:  [phone]  Name: Donald [name] MRN: [id number] Date of Birth: 08-01-1933

## 2018-03-11 NOTE — Telephone Encounter (Signed)
Please advise 

## 2018-03-12 ENCOUNTER — Ambulatory Visit
Admission: RE | Admit: 2018-03-12 | Discharge: 2018-03-12 | Disposition: A | Discharge status: Home / Self Care | Payer: Medicare HMO | Source: Ambulatory Visit | Attending: Internal Medicine | Admitting: Internal Medicine

## 2018-03-12 ENCOUNTER — Ambulatory Visit (INDEPENDENT_AMBULATORY_CARE_PROVIDER_SITE_OTHER): Payer: Medicare HMO | Admitting: Internal Medicine

## 2018-03-12 ENCOUNTER — Encounter: Payer: Self-pay | Admitting: Internal Medicine

## 2018-03-12 VITALS — BP 130/66 | HR 96 | Temp 99.3°F | Ht 68.0 in | Wt 216.8 lb

## 2018-03-12 DIAGNOSIS — I700 Atherosclerosis of aorta: Secondary | ICD-10-CM | POA: Insufficient documentation

## 2018-03-12 DIAGNOSIS — R600 Localized edema: Secondary | ICD-10-CM

## 2018-03-12 DIAGNOSIS — R0602 Shortness of breath: Secondary | ICD-10-CM | POA: Insufficient documentation

## 2018-03-12 DIAGNOSIS — R911 Solitary pulmonary nodule: Secondary | ICD-10-CM

## 2018-03-12 DIAGNOSIS — R590 Localized enlarged lymph nodes: Secondary | ICD-10-CM | POA: Insufficient documentation

## 2018-03-12 DIAGNOSIS — R269 Unspecified abnormalities of gait and mobility: Secondary | ICD-10-CM | POA: Diagnosis not present

## 2018-03-12 DIAGNOSIS — R251 Tremor, unspecified: Secondary | ICD-10-CM

## 2018-03-12 DIAGNOSIS — J329 Chronic sinusitis, unspecified: Secondary | ICD-10-CM | POA: Diagnosis not present

## 2018-03-12 DIAGNOSIS — R410 Disorientation, unspecified: Secondary | ICD-10-CM | POA: Diagnosis not present

## 2018-03-12 DIAGNOSIS — R918 Other nonspecific abnormal finding of lung field: Secondary | ICD-10-CM | POA: Insufficient documentation

## 2018-03-12 DIAGNOSIS — J189 Pneumonia, unspecified organism: Secondary | ICD-10-CM | POA: Diagnosis not present

## 2018-03-12 DIAGNOSIS — Z8673 Personal history of transient ischemic attack (TIA), and cerebral infarction without residual deficits: Secondary | ICD-10-CM | POA: Insufficient documentation

## 2018-03-12 DIAGNOSIS — R21 Rash and other nonspecific skin eruption: Secondary | ICD-10-CM

## 2018-03-12 DIAGNOSIS — R3915 Urgency of urination: Secondary | ICD-10-CM

## 2018-03-12 MED ORDER — LEVOFLOXACIN 750 MG PO TABS: 750.0000 mg | ORAL_TABLET | ORAL | 0 refills | Status: DC

## 2018-03-12 MED ORDER — IOPAMIDOL (ISOVUE-370) INJECTION 76%
75.0000 mL | Freq: Once | INTRAVENOUS | Status: AC | PRN
  Administered 2018-03-12: 75 mL via INTRAVENOUS

## 2018-03-12 NOTE — Patient Instructions (Addendum)
levaquin take 750 mg every other day with food  We are ordering CT chest and MRI of your brain and ultrasound of your legs  Please try Robitussin DM or Mucinex DM green lab  Follow up in 7-10 days   Community-Acquired Pneumonia, Adult Pneumonia is an infection of the lungs. There are different types of pneumonia. One type can develop while a person is in a hospital. A different type, called community-acquired pneumonia, develops in people who are not, or have not recently been, in the hospital or other health care facility. What are the causes? Pneumonia may be caused by bacteria, viruses, or funguses. Community-acquired pneumonia is often caused by Streptococcus pneumonia bacteria. These bacteria are often passed from one person to another by breathing in droplets from the cough or sneeze of an infected person. What increases the risk? The condition is more likely to develop in:  People who havechronic diseases, such as chronic obstructive pulmonary disease (COPD), asthma, congestive heart failure, cystic fibrosis, diabetes, or kidney disease.  People who haveearly-stage or late-stage HIV.  People who havesickle cell disease.  People who havehad their spleen removed (splenectomy).  People who havepoor Human resources officer.  People who havemedical conditions that increase the risk of breathing in (aspirating) secretions their own mouth and nose.  People who havea weakened immune system (immunocompromised).  People who smoke.  People whotravel to areas where pneumonia-causing germs commonly exist.  People whoare around animal habitats or animals that have pneumonia-causing germs, including birds, bats, rabbits, cats, and farm animals.  What are the signs or symptoms? Symptoms of this condition include:  Adry cough.  A wet (productive) cough.  Fever.  Sweating.  Chest pain, especially when breathing deeply or coughing.  Rapid breathing or difficulty  breathing.  Shortness of breath.  Shaking chills.  Fatigue.  Muscle aches.  How is this diagnosed? Your health care provider will take a medical history and perform a physical exam. You may also have other tests, including:  Imaging studies of your chest, including X-rays.  Tests to check your blood oxygen level and other blood gases.  Other tests on blood, mucus (sputum), fluid around your lungs (pleural fluid), and urine.  If your pneumonia is severe, other tests may be done to identify the specific cause of your illness. How is this treated? The type of treatment that you receive depends on many factors, such as the cause of your pneumonia, the medicines you take, and other medical conditions that you have. For most adults, treatment and recovery from pneumonia may occur at home. In some cases, treatment must happen in a hospital. Treatment may include:  Antibiotic medicines, if the pneumonia was caused by bacteria.  Antiviral medicines, if the pneumonia was caused by a virus.  Medicines that are given by mouth or through an IV tube.  Oxygen.  Respiratory therapy.  Although rare, treating severe pneumonia may include:  Mechanical ventilation. This is done if you are not breathing well on your own and you cannot maintain a safe blood oxygen level.  Thoracentesis. This procedureremoves fluid around one lung or both lungs to help you breathe better.  Follow these instructions at home:  Take over-the-counter and prescription medicines only as told by your health care provider. ? Only takecough medicine if you are losing sleep. Understand that cough medicine can prevent your body's natural ability to remove mucus from your lungs. ? If you were prescribed an antibiotic medicine, take it as told by your health care provider. Do  not stop taking the antibiotic even if you start to feel better.  Sleep in a semi-upright position at night. Try sleeping in a reclining chair, or  place a few pillows under your head.  Do not use tobacco products, including cigarettes, chewing tobacco, and e-cigarettes. If you need help quitting, ask your health care provider.  Drink enough water to keep your urine clear or pale yellow. This will help to thin out mucus secretions in your lungs. How is this prevented? There are ways that you can decrease your risk of developing community-acquired pneumonia. Consider getting a pneumococcal vaccine if:  You are older than 82 years of age.  You are older than 82 years of age and are undergoing cancer treatment, have chronic lung disease, or have other medical conditions that affect your immune system. Ask your health care provider if this applies to you.  There are different types and schedules of pneumococcal vaccines. Ask your health care provider which vaccination option is best for you. You may also prevent community-acquired pneumonia if you take these actions:  Get an influenza vaccine every year. Ask your health care provider which type of influenza vaccine is best for you.  Go to the dentist on a regular basis.  Wash your hands often. Use hand sanitizer if soap and water are not available.  Contact a health care provider if:  You have a fever.  You are losing sleep because you cannot control your cough with cough medicine. Get help right away if:  You have worsening shortness of breath.  You have increased chest pain.  Your sickness becomes worse, especially if you are an older adult or have a weakened immune system.  You cough up blood. This information is not intended to replace advice given to you by your health care provider. Make sure you discuss any questions you have with your health care provider. Document Released: 10/28/2005 Document Revised: 03/07/2016 Document Reviewed: 02/22/2015 Elsevier Interactive Patient Education  2018 [employer].  Confusion Confusion is the inability to think with your usual  speed or clarity. Confusion may come on quickly or slowly over time. How quickly the confusion comes on depends on the cause. Confusion can be due to any number of causes. What are the causes?  Concussion, head injury, or head trauma.  Seizures.  Stroke.  Fever.  Brain tumor.  Age related decreased brain function (dementia).  Heightened emotional states like rage or terror.  Mental illness in which the person loses the ability to determine what is real and what is not (hallucinations).  Infections such as a urinary tract infection (UTI).  Toxic effects from alcohol, drugs, or prescription medicines.  Dehydration and an imbalance of salts in the body (electrolytes).  Lack of sleep.  Low blood sugar (diabetes).  Low levels of oxygen from conditions such as chronic lung disorders.  Drug interactions or other medicine side effects.  Nutritional deficiencies, especially niacin, thiamine, vitamin C, or vitamin B.  Sudden drop in body temperature (hypothermia).  Change in routine, such as when traveling or hospitalized. What are the signs or symptoms? People often describe their thinking as cloudy or unclear when they are confused. Confusion can also include feeling disoriented. That means you are unaware of where or who you are. You may also not know what the date or time is. If confused, you may also have difficulty paying attention, remembering, and making decisions. Some people also act aggressively when they are confused. How is this diagnosed? The medical  evaluation of confusion may include:  Blood and urine tests.  X-rays.  Brain and nervous system tests.  Analyzing your brain waves (electroencephalogram or EEG).  Magnetic resonance imaging (MRI) of your head.  Computed tomography (CT) scan of your head.  Mental status tests in which your health care provider may ask many questions. Some of these questions may seem silly or strange, but they are a very important  test to help diagnose and treat confusion.  How is this treated? An admission to the hospital may not be needed, but a person with confusion should not be left alone. Stay with a family member or friend until the confusion clears. Avoid alcohol, pain relievers, or sedative drugs until you have fully recovered. Do not drive until directed by your health care provider. Follow these instructions at home: What family and friends can do:  To find out if someone is confused, ask the person to state his or her name, age, and the date. If the person is unsure or answers incorrectly, he or she is confused.  Always introduce yourself, no matter how well the person knows you.  Often remind the person of his or her location.  Place a calendar and clock near the confused person.  Help the person with his or her medicines. You may want to use a pill box, an alarm as a reminder, or give the person each dose as prescribed.  Talk about current events and plans for the day.  Try to keep the environment calm, quiet, and peaceful.  Make sure the person keeps follow-up visits with his or her health care provider.  How is this prevented? Ways to prevent confusion:  Avoid alcohol.  Eat a balanced diet.  Get enough sleep.  Take medicine only as directed by your health care provider.  Do not become isolated. Spend time with other people and make plans for your days.  Keep careful watch on your blood sugar levels if you are diabetic.  Get help right away if:  You develop severe headaches, repeated vomiting, seizures, blackouts, or slurred speech.  There is increasing confusion, weakness, numbness, restlessness, or personality changes.  You develop a loss of balance, have marked dizziness, feel uncoordinated, or fall.  You have delusions, hallucinations, or develop severe anxiety.  Your family members think you need to be rechecked. This information is not intended to replace advice given to  you by your health care provider. Make sure you discuss any questions you have with your health care provider. Document Released: 12/05/2004 Document Revised: 05/17/2016 Document Reviewed: 12/03/2013 Elsevier Interactive Patient Education  [employer].

## 2018-03-12 NOTE — Progress Notes (Signed)
Pre visit review using our clinic review tool, if applicable. No additional management support is needed unless otherwise documented below in the visit note. 

## 2018-03-12 NOTE — Progress Notes (Signed)
Chief Complaint  Patient presents with  . Fall   F/u with wife  1. C/o falls and weakness now using walker to walk. He declines PT 2/2 cost  2. H/o pneumonia noted CT chest 01/03/18 and lung nodule and mediastinal lymphadenopathy rec repeat CT chest in 3 months. He c/o cough, wheezing nothing coming up with cough 3. More confused x 2 weeks  4. He has rash to trunk saw dermatology PA for Dr. Kellie Moor and had bx lesions blistering though not itching  5. He reports biopsy of lesion in throat Dr. Pryor Ochoa and need to get report pt states was abnormal  6. Wife reports pt is shaking  7. Also c/o b/l leg edema  Review of Systems  Constitutional: Positive for weight loss.       +down 3 lbs   HENT: Negative for hearing loss.   Eyes: Negative for blurred vision.  Respiratory: Positive for cough and wheezing. Negative for sputum production.   Cardiovascular: Positive for palpitations. Negative for chest pain.       HR 102 today per wife   Musculoskeletal: Positive for falls.  Skin: Positive for rash.  Neurological: Positive for tremors.  Psychiatric/Behavioral: Positive for memory loss.       +confusion    Past Medical History:  Diagnosis Date  . Arthritis   . Esophageal reflux   . Hyperlipidemia   . Hypertension   . Sebaceous cyst    Past Surgical History:  Procedure Laterality Date  . HEMORRHOID SURGERY     Family History  Problem Relation Age of Onset  . Heart disease Mother   . Cancer Maternal Aunt        lung   Social History   Socioeconomic History  . Marital status: Married    Spouse name: Not on file  . Number of children: Not on file  . Years of education: Not on file  . Highest education level: Not on file  Occupational History  . Not on file  Social Needs  . Financial resource strain: Not on file  . Food insecurity:    Worry: Not on file    Inability: Not on file  . Transportation needs:    Medical: Not on file    Non-medical: Not on file  Tobacco Use  .  Smoking status: Former Smoker    Packs/day: 1.50    Years: 50.00    Pack years: 75.00    Types: Cigarettes    Last attempt to quit: 09/10/1999    Years since quitting: 18.5  . Smokeless tobacco: Never Used  Substance and Sexual Activity  . Alcohol use: No    Alcohol/week: 0.0 oz  . Drug use: No  . Sexual activity: Never  Lifestyle  . Physical activity:    Days per week: Not on file    Minutes per session: Not on file  . Stress: Not on file  Relationships  . Social connections:    Talks on phone: Not on file    Gets together: Not on file    Attends religious service: Not on file    Active member of club or organization: Not on file    Attends meetings of clubs or organizations: Not on file    Relationship status: Not on file  . Intimate partner violence:    Fear of current or ex partner: Not on file    Emotionally abused: Not on file    Physically abused: Not on file    Forced sexual  activity: Not on file  Other Topics Concern  . Not on file  Social History Narrative   Lives in [place] with wife. Dog and cat in home.      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  Dietitian.      Diet - regular      Exercise - Walking   Current Meds  Medication Sig  . albuterol (PROVENTIL HFA;VENTOLIN HFA) 108 (90 Base) MCG/ACT inhaler Inhale 1-2 puffs into the lungs every 4 (four) hours as needed for wheezing or shortness of breath.  Marland Kitchen amLODipine (NORVASC) 5 MG tablet Take 1 tablet (5 mg total) by mouth 2 (two) times daily.  . cholecalciferol (VITAMIN D) 1000 UNITS tablet Take 1,000 Units by mouth daily.  Marland Kitchen dutasteride (AVODART) 0.5 MG capsule Take 0.5 mg by mouth 2 (two) times a week.  Marland Kitchen ketoconazole (NIZORAL) 2 % cream Apply 1 application topically daily. To 2-3 skin lesions  . Multiple Vitamins-Minerals (MEGA MULTIVITAMIN FOR MEN) TABS Take 2 capsules by mouth daily.  Marland Kitchen omeprazole (PRILOSEC) 20 MG capsule Take 1 capsule (20 mg total) by mouth daily.  . sertraline (ZOLOFT) 50 MG  tablet Take 1 tablet (50 mg total) by mouth daily.  . Tamsulosin HCl (FLOMAX) 0.4 MG CAPS Take 0.4 mg by mouth daily.  Marland Kitchen tiotropium (SPIRIVA) 18 MCG inhalation capsule Place 18 mcg into inhaler and inhale daily.  . valACYclovir (VALTREX) 500 MG tablet Take 1 tablet (500 mg total) by mouth every other day.   Allergies  Allergen Reactions  . Cephalexin Rash   Recent Results (from the past 2160 hour(s))  CBC     Status: Abnormal   Collection Time: 01/03/18  3:00 PM  Result Value Ref Range   WBC 24.1 (H) 3.8 - 10.6 K/uL   RBC 4.25 (L) 4.40 - 5.90 MIL/uL   Hemoglobin 12.1 (L) 13.0 - 18.0 g/dL   HCT 36.9 (L) 40.0 - 52.0 %   MCV 86.7 80.0 - 100.0 fL   MCH 28.5 26.0 - 34.0 pg   MCHC 32.9 32.0 - 36.0 g/dL   RDW 13.9 11.5 - 14.5 %   Platelets 246 150 - 440 K/uL    Comment: Performed at [facility], [address] [place], [place] [address]  Troponin I     Status: Abnormal   Collection Time: 01/03/18  3:00 PM  Result Value Ref Range   Troponin I 0.06 (HH) <0.03 ng/mL    Comment: CRITICAL RESULT CALLED TO, READ BACK BY AND VERIFIED WITH MONICA MOON AT 1536 ON 01/03/18 BY SNJ Performed at [facility], [address] [place], [place] [address]   Comprehensive metabolic panel     Status: Abnormal   Collection Time: 01/03/18  3:00 PM  Result Value Ref Range   Sodium 138 135 - 145 mmol/L   Potassium 4.0 3.5 - 5.1 mmol/L   Chloride 109 101 - 111 mmol/L   CO2 18 (L) 22 - 32 mmol/L   Glucose, Bld 167 (H) 65 - 99 mg/dL   BUN 28 (H) 6 - 20 mg/dL   Creatinine, Ser 1.46 (H) 0.61 - 1.24 mg/dL   Calcium 9.2 8.9 - 10.3 mg/dL   Total Protein 7.4 6.5 - 8.1 g/dL   Albumin 4.1 3.5 - 5.0 g/dL   AST 31 15 - 41 U/L   ALT 23 17 - 63 U/L   Alkaline Phosphatase 72 38 - 126 U/L   Total Bilirubin 0.4 0.3 - 1.2 mg/dL  GFR calc non Af Amer 43 (L) >60 mL/min   GFR calc Af Amer 49 (L) >60 mL/min    Comment: (NOTE) The eGFR has been calculated using the CKD EPI equation. This  calculation has not been validated in all clinical situations. eGFR's persistently <60 mL/min signify possible Chronic Kidney Disease.    Anion gap 11 5 - 15    Comment: Performed at [facility], [address] [place], [place] [address]  Influenza panel by PCR (type A & B)     Status: None   Collection Time: 01/03/18  4:54 PM  Result Value Ref Range   Influenza A By PCR NEGATIVE NEGATIVE   Influenza B By PCR NEGATIVE NEGATIVE    Comment: (NOTE) The Xpert Xpress Flu assay is intended as an aid in the diagnosis of  influenza and should not be used as a sole basis for treatment.  This  assay is FDA approved for nasopharyngeal swab specimens only. Nasal  washings and aspirates are unacceptable for Xpert Xpress Flu testing. Performed at [facility], [address] [place], [place] [address]   Blood culture (routine x 2)     Status: None   Collection Time: 01/03/18  4:54 PM  Result Value Ref Range   Specimen Description BLOOD RIGHT ANTECUBITAL    Special Requests      BOTTLES DRAWN AEROBIC AND ANAEROBIC Blood Culture results may not be optimal due to an excessive volume of blood received in culture bottles   Culture      NO GROWTH 5 DAYS Performed at [facility], [address] [place], [place] [address]    Report Status 01/08/2018 FINAL   Blood culture (routine x 2)     Status: None   Collection Time: 01/03/18  4:54 PM  Result Value Ref Range   Specimen Description BLOOD LEFT ANTECUBITAL    Special Requests      BOTTLES DRAWN AEROBIC AND ANAEROBIC Blood Culture results may not be optimal due to an excessive volume of blood received in culture bottles   Culture      NO GROWTH 5 DAYS Performed at [facility], [address] [place], [place] [address]    Report Status 01/08/2018 FINAL   Procalcitonin - Baseline     Status: None   Collection Time: 01/03/18  5:30 PM  Result Value Ref Range   Procalcitonin 0.75 ng/mL    Comment:         Interpretation: PCT > 0.5 ng/mL and <= 2 ng/mL: Systemic infection (sepsis) is possible, but other conditions are known to elevate PCT as well. (NOTE)       Sepsis PCT Algorithm           Lower Respiratory Tract                                      Infection PCT Algorithm    ----------------------------     ----------------------------         PCT < 0.25 ng/mL                PCT < 0.10 ng/mL         Strongly encourage             Strongly discourage   discontinuation of antibiotics    initiation of antibiotics    ----------------------------     -----------------------------  PCT 0.25 - 0.50 ng/mL            PCT 0.10 - 0.25 ng/mL               OR       >80% decrease in PCT            Discourage initiation of                                            antibiotics      Encourage discontinuation           of antibiotics    ----------------------------     -----------------------------         PCT >= 0.50 ng/mL              PCT 0.26 - 0.50 ng/mL                AND       <80% decrease in PCT             Encourage initiation of                                             antibiotics       Encourage continuation           of antibiotics    ----------------------------     -----------------------------        PCT >= 0.50 ng/mL                  PCT > 0.50 ng/mL               AND         increase in PCT                  Strongly encourage                                      initiation of antibiotics    Strongly encourage escalation           of antibiotics                                     -----------------------------                                           PCT <= 0.25 ng/mL                                                 OR                                        > 80% decrease in PCT  Discontinue / Do not initiate                                             antibiotics Performed at [facility], [address]  [place], [place] [address]   Troponin I     Status: Abnormal   Collection Time: 01/03/18  9:23 PM  Result Value Ref Range   Troponin I 0.05 (HH) <0.03 ng/mL    Comment: CRITICAL VALUE NOTED. VALUE IS CONSISTENT WITH PREVIOUSLY REPORTED/CALLED VALUE.PMH Performed at [facility], [address] [place], [place] [address]   Procalcitonin     Status: None   Collection Time: 01/04/18  1:43 AM  Result Value Ref Range   Procalcitonin 0.70 ng/mL    Comment:        Interpretation: PCT > 0.5 ng/mL and <= 2 ng/mL: Systemic infection (sepsis) is possible, but other conditions are known to elevate PCT as well. (NOTE)       Sepsis PCT Algorithm           Lower Respiratory Tract                                      Infection PCT Algorithm    ----------------------------     ----------------------------         PCT < 0.25 ng/mL                PCT < 0.10 ng/mL         Strongly encourage             Strongly discourage   discontinuation of antibiotics    initiation of antibiotics    ----------------------------     -----------------------------       PCT 0.25 - 0.50 ng/mL            PCT 0.10 - 0.25 ng/mL               OR       >80% decrease in PCT            Discourage initiation of                                            antibiotics      Encourage discontinuation           of antibiotics    ----------------------------     -----------------------------         PCT >= 0.50 ng/mL              PCT 0.26 - 0.50 ng/mL                AND       <80% decrease in PCT             Encourage initiation of                                             antibiotics       Encourage continuation  of antibiotics    ----------------------------     -----------------------------        PCT >= 0.50 ng/mL                  PCT > 0.50 ng/mL               AND         increase in PCT                  Strongly encourage                                      initiation of antibiotics    Strongly  encourage escalation           of antibiotics                                     -----------------------------                                           PCT <= 0.25 ng/mL                                                 OR                                        > 80% decrease in PCT                                     Discontinue / Do not initiate                                             antibiotics Performed at [facility], [address] [place], [place] [address]   CBC     Status: Abnormal   Collection Time: 01/04/18  1:43 AM  Result Value Ref Range   WBC 17.0 (H) 3.8 - 10.6 K/uL   RBC 4.09 (L) 4.40 - 5.90 MIL/uL   Hemoglobin 11.8 (L) 13.0 - 18.0 g/dL   HCT 35.6 (L) 40.0 - 52.0 %   MCV 87.2 80.0 - 100.0 fL   MCH 28.8 26.0 - 34.0 pg   MCHC 33.1 32.0 - 36.0 g/dL   RDW 14.4 11.5 - 14.5 %   Platelets 203 150 - 440 K/uL    Comment: Performed at [facility], [address] [place], [place] [address]  Basic metabolic panel     Status: Abnormal   Collection Time: 01/04/18  1:43 AM  Result Value Ref Range   Sodium 137 135 - 145 mmol/L   Potassium 3.6 3.5 - 5.1 mmol/L   Chloride 107 101 - 111 mmol/L   CO2 20 (L) 22 - 32 mmol/L   Glucose, Bld 109 (H) 65 - 99  mg/dL   BUN 30 (H) 6 - 20 mg/dL   Creatinine, Ser 1.36 (H) 0.61 - 1.24 mg/dL   Calcium 8.7 (L) 8.9 - 10.3 mg/dL   GFR calc non Af Amer 46 (L) >60 mL/min   GFR calc Af Amer 54 (L) >60 mL/min    Comment: (NOTE) The eGFR has been calculated using the CKD EPI equation. This calculation has not been validated in all clinical situations. eGFR's persistently <60 mL/min signify possible Chronic Kidney Disease.    Anion gap 10 5 - 15    Comment: Performed at [facility], [address] [place], [place] [address]  Troponin I     Status: Abnormal   Collection Time: 01/04/18  1:43 AM  Result Value Ref Range   Troponin I 0.03 (HH) <0.03 ng/mL    Comment: CRITICAL VALUE NOTED. VALUE IS CONSISTENT  WITH PREVIOUSLY REPORTED/CALLED VALUE.PMH Performed at [facility], [address] [place], [place] [address]   Troponin I     Status: None   Collection Time: 01/04/18  5:37 AM  Result Value Ref Range   Troponin I <0.03 <0.03 ng/mL    Comment: Performed at [facility], [address] [place], [place] [address]  HgB A1c     Status: Abnormal   Collection Time: 03/02/18 10:07 AM  Result Value Ref Range   Hgb A1c MFr Bld 6.6 (H) 4.6 - 6.5 %    Comment: Glycemic Control Guidelines for People with Diabetes:Non Diabetic:  <6%Goal of Therapy: <7%Additional Action Suggested:  >8%   Comp Met (CMET)     Status: Abnormal   Collection Time: 03/02/18 10:07 AM  Result Value Ref Range   Sodium 137 135 - 145 mEq/L   Potassium 4.8 3.5 - 5.1 mEq/L   Chloride 102 96 - 112 mEq/L   CO2 26 19 - 32 mEq/L   Glucose, Bld 99 70 - 99 mg/dL   BUN 22 6 - 23 mg/dL   Creatinine, Ser 1.30 0.40 - 1.50 mg/dL   Total Bilirubin 0.3 0.2 - 1.2 mg/dL   Alkaline Phosphatase 80 39 - 117 U/L   AST 24 0 - 37 U/L   ALT 24 0 - 53 U/L   Total Protein 7.2 6.0 - 8.3 g/dL   Albumin 4.3 3.5 - 5.2 g/dL   Calcium 9.7 8.4 - 10.5 mg/dL   GFR 56.00 (L) >60.00 mL/min   Objective  Body mass index is 32.96 kg/m. Wt Readings from Last 3 Encounters:  03/12/18 216 lb 12.8 oz (98.3 kg)  03/02/18 219 lb 9.6 oz (99.6 kg)  01/15/18 216 lb (98 kg)   Temp Readings from Last 3 Encounters:  03/12/18 99.3 F (37.4 C) (Oral)  03/02/18 97.7 F (36.5 C) (Oral)  01/04/18 98.2 F (36.8 C) (Oral)   BP Readings from Last 3 Encounters:  03/12/18 130/66  03/02/18 (!) 152/68  01/15/18 (!) 126/58   Pulse Readings from Last 3 Encounters:  03/12/18 96  03/02/18 79  01/15/18 (!) 59    Physical Exam  Constitutional: He is oriented to person, place, and time. He appears well-developed and well-nourished. He is cooperative.  HENT:  Head: Normocephalic and atraumatic.  Mouth/Throat: Oropharynx is clear and moist and  mucous membranes are normal.  Eyes: Pupils are equal, round, and reactive to light. Conjunctivae are normal.  Cardiovascular: Normal rate, regular rhythm and normal heart sounds.  Pulmonary/Chest: Effort normal. He has wheezes.  Course breath sounds b/l and wheezing   Neurological:  He is alert and oriented to person, place, and time.  V1 left reduced sensation other sensory areas normal  Rolling walker today  5/5 strength upper and lower ext b/l    Skin: Skin is warm, dry and intact. Rash noted. Rash is macular, maculopapular and vesicular. There is erythema.  Red macules annular and some blistering to left abdomen   Psychiatric: He has a normal mood and affect. His speech is normal and behavior is normal. Judgment and thought content normal. Cognition and memory are normal.  Nursing note and vitals reviewed.   Assessment   1. Abnormal gait and weakness and confusion  2. Pneumonia persistently noted on CT chest 01/03/18 and CXR 01/2018  3. Rash to trunk ? Etiology  4. Abnormal mouth biopsy  5. Tremor  6. Leg edema L>R 7. HM  Plan  1.  Rec use rolling walker  Check BMET, CBC, UA, culture for urinary urgency MRI brain stat 2. CT chest with contrast  Prn albuterol, cont spiriva  Consider add symbicort in future  levaquiin 750 qod x 5 pills   3. Get bx report Dr. Clemon Chambers office  4. Get records Dr. Pryor Ochoa 5. MRI brain  6.  R/o DVT US  7.  F/u with PCP HM  Provider: Dr. Olivia Mackie McLean-Scocuzza-Internal Medicine

## 2018-03-12 NOTE — Telephone Encounter (Signed)
Spoke with pt's wife and she stated that they have an appt scheduled with Dr. Aundra Dubin at 2:30pm.

## 2018-03-13 ENCOUNTER — Telehealth: Payer: Self-pay

## 2018-03-13 ENCOUNTER — Other Ambulatory Visit: Payer: Self-pay | Admitting: Internal Medicine

## 2018-03-13 ENCOUNTER — Ambulatory Visit: Payer: Self-pay

## 2018-03-13 ENCOUNTER — Telehealth: Payer: Self-pay | Admitting: Family Medicine

## 2018-03-13 DIAGNOSIS — D72829 Elevated white blood cell count, unspecified: Secondary | ICD-10-CM

## 2018-03-13 DIAGNOSIS — R591 Generalized enlarged lymph nodes: Secondary | ICD-10-CM

## 2018-03-13 DIAGNOSIS — E785 Hyperlipidemia, unspecified: Secondary | ICD-10-CM

## 2018-03-13 DIAGNOSIS — I639 Cerebral infarction, unspecified: Secondary | ICD-10-CM

## 2018-03-13 DIAGNOSIS — R21 Rash and other nonspecific skin eruption: Secondary | ICD-10-CM

## 2018-03-13 DIAGNOSIS — J189 Pneumonia, unspecified organism: Secondary | ICD-10-CM

## 2018-03-13 DIAGNOSIS — J181 Lobar pneumonia, unspecified organism: Secondary | ICD-10-CM

## 2018-03-13 LAB — CBC WITH DIFFERENTIAL/PLATELET
BASOS PCT: 0.9 % (ref 0.0–3.0)
Basophils Absolute: 0.2 10*3/uL — ABNORMAL HIGH (ref 0.0–0.1)
EOS ABS: 0.2 10*3/uL (ref 0.0–0.7)
Eosinophils Relative: 1.1 % (ref 0.0–5.0)
HCT: 36.2 % — ABNORMAL LOW (ref 39.0–52.0)
Hemoglobin: 12 g/dL — ABNORMAL LOW (ref 13.0–17.0)
LYMPHS ABS: 2 10*3/uL (ref 0.7–4.0)
Lymphocytes Relative: 10.1 % — ABNORMAL LOW (ref 12.0–46.0)
MCHC: 33 g/dL (ref 30.0–36.0)
MCV: 85.9 fl (ref 78.0–100.0)
MONO ABS: 1.4 10*3/uL — AB (ref 0.1–1.0)
Monocytes Relative: 7.1 % (ref 3.0–12.0)
NEUTROS ABS: 16.4 10*3/uL — AB (ref 1.4–7.7)
Neutrophils Relative %: 80.8 % — ABNORMAL HIGH (ref 43.0–77.0)
PLATELETS: 227 10*3/uL (ref 150.0–400.0)
RBC: 4.22 Mil/uL (ref 4.22–5.81)
RDW: 14.2 % (ref 11.5–15.5)
WBC: 20.3 10*3/uL (ref 4.0–10.5)

## 2018-03-13 LAB — URINALYSIS, ROUTINE W REFLEX MICROSCOPIC
Bilirubin, UA: NEGATIVE
GLUCOSE, UA: NEGATIVE
KETONES UA: NEGATIVE
Leukocytes, UA: NEGATIVE
NITRITE UA: NEGATIVE
SPEC GRAV UA: 1.022 (ref 1.005–1.030)
UUROB: 0.2 mg/dL (ref 0.2–1.0)
pH, UA: 6 (ref 5.0–7.5)

## 2018-03-13 LAB — BASIC METABOLIC PANEL
BUN: 19 mg/dL (ref 6–23)
CO2: 22 mEq/L (ref 19–32)
Calcium: 9.1 mg/dL (ref 8.4–10.5)
Chloride: 101 mEq/L (ref 96–112)
Creatinine, Ser: 1.63 mg/dL — ABNORMAL HIGH (ref 0.40–1.50)
GFR: 43.13 mL/min — AB (ref >60.00)
Glucose, Bld: 122 mg/dL — ABNORMAL HIGH (ref 70–99)
POTASSIUM: 4.1 meq/L (ref 3.5–5.1)
SODIUM: 135 meq/L (ref 135–145)

## 2018-03-13 LAB — MICROSCOPIC EXAMINATION: Casts: NONE SEEN /lpf

## 2018-03-13 MED ORDER — ATORVASTATIN CALCIUM 10 MG PO TABS: 10.0000 mg | ORAL_TABLET | Freq: Every day | ORAL | 3 refills | Status: DC

## 2018-03-13 NOTE — Telephone Encounter (Signed)
Unable to leave message for patient to return call back. PEC maygive results and obtain information.  Results were given to patient earlier today. Patients wife would like to know them.  Please triage patient from earlier call.

## 2018-03-13 NOTE — Telephone Encounter (Signed)
Copied from [place] [phone]. Topic: Inquiry >> Mar 13, 2018  9:57 AM Pricilla Handler wrote: Reason for CRM: Patient's wife called on behalf of the patient requesting his imaging results. Please call at [phone].

## 2018-03-13 NOTE — Telephone Encounter (Signed)
Please advise 

## 2018-03-13 NOTE — Telephone Encounter (Signed)
Mailbox is full - unable to leave a message.

## 2018-03-13 NOTE — Telephone Encounter (Signed)
Spoke with pt. And he reports he did get his results from his studies done yesterday. States he is "feeling alright." Instructed to call as needed.

## 2018-03-13 NOTE — Telephone Encounter (Signed)
Spoke with wife and pt today  F/u sch next week   TSM

## 2018-03-13 NOTE — Telephone Encounter (Signed)
Pt.'s wife called to report his BP is elevated 177/70 , pulse 100. States "he gets winded just walking to the bathroom. I'm worried about him." States she would like someone to call her back about his x-rays he had yesterday.

## 2018-03-13 NOTE — Telephone Encounter (Signed)
FYI

## 2018-03-13 NOTE — Telephone Encounter (Unsigned)
Copied from [place] [phone]. Topic: Quick Communication - Lab Results >> Mar 13, 2018  3:30 PM [name] wrote: Pt's wife called asking for Donald [name] to give her a call regarding her husband and his scan results.

## 2018-03-13 NOTE — Telephone Encounter (Signed)
[facility] called with critical result. WBC 20.3.

## 2018-03-14 LAB — URINE CULTURE: Organism ID, Bacteria: NO GROWTH

## 2018-03-17 ENCOUNTER — Inpatient Hospital Stay: Payer: Medicare HMO | Attending: Oncology | Admitting: Oncology

## 2018-03-17 ENCOUNTER — Inpatient Hospital Stay: Payer: Medicare HMO

## 2018-03-17 ENCOUNTER — Other Ambulatory Visit: Payer: Self-pay

## 2018-03-17 ENCOUNTER — Encounter: Payer: Self-pay | Admitting: Oncology

## 2018-03-17 VITALS — BP 141/62 | HR 61 | Temp 97.4°F | Resp 18 | Wt 215.0 lb

## 2018-03-17 DIAGNOSIS — D72829 Elevated white blood cell count, unspecified: Secondary | ICD-10-CM | POA: Diagnosis not present

## 2018-03-17 DIAGNOSIS — R591 Generalized enlarged lymph nodes: Secondary | ICD-10-CM

## 2018-03-17 DIAGNOSIS — Z87891 Personal history of nicotine dependence: Secondary | ICD-10-CM | POA: Insufficient documentation

## 2018-03-17 DIAGNOSIS — I10 Essential (primary) hypertension: Secondary | ICD-10-CM | POA: Diagnosis not present

## 2018-03-17 DIAGNOSIS — E785 Hyperlipidemia, unspecified: Secondary | ICD-10-CM | POA: Insufficient documentation

## 2018-03-17 DIAGNOSIS — K219 Gastro-esophageal reflux disease without esophagitis: Secondary | ICD-10-CM | POA: Insufficient documentation

## 2018-03-17 DIAGNOSIS — Z79899 Other long term (current) drug therapy: Secondary | ICD-10-CM | POA: Diagnosis not present

## 2018-03-17 LAB — CBC WITH DIFFERENTIAL/PLATELET
BASOS ABS: 0.1 10*3/uL (ref 0–0.1)
BASOS PCT: 1 %
EOS PCT: 9 %
Eosinophils Absolute: 1.1 10*3/uL — ABNORMAL HIGH (ref 0–0.7)
HCT: 35.1 % — ABNORMAL LOW (ref 40.0–52.0)
Hemoglobin: 11.6 g/dL — ABNORMAL LOW (ref 13.0–18.0)
LYMPHS PCT: 23 %
Lymphs Abs: 2.6 10*3/uL (ref 1.0–3.6)
MCH: 28.3 pg (ref 26.0–34.0)
MCHC: 33.1 g/dL (ref 32.0–36.0)
MCV: 85.6 fL (ref 80.0–100.0)
Monocytes Absolute: 1 10*3/uL (ref 0.2–1.0)
Monocytes Relative: 9 %
Neutro Abs: 6.8 10*3/uL — ABNORMAL HIGH (ref 1.4–6.5)
Neutrophils Relative %: 58 %
PLATELETS: 295 10*3/uL (ref 150–440)
RBC: 4.1 MIL/uL — AB (ref 4.40–5.90)
RDW: 14.5 % (ref 11.5–14.5)
WBC: 11.7 10*3/uL — AB (ref 3.8–10.6)

## 2018-03-17 LAB — LACTATE DEHYDROGENASE: LDH: 155 U/L (ref 98–192)

## 2018-03-17 NOTE — Progress Notes (Signed)
Pt in for new patient visit.  Wife and daughter are with patient.

## 2018-03-19 ENCOUNTER — Ambulatory Visit (INDEPENDENT_AMBULATORY_CARE_PROVIDER_SITE_OTHER): Payer: Medicare HMO | Admitting: Internal Medicine

## 2018-03-19 ENCOUNTER — Encounter: Payer: Self-pay | Admitting: Internal Medicine

## 2018-03-19 VITALS — BP 146/54 | HR 78 | Temp 98.3°F | Ht 68.0 in | Wt 217.6 lb

## 2018-03-19 DIAGNOSIS — D72829 Elevated white blood cell count, unspecified: Secondary | ICD-10-CM | POA: Diagnosis not present

## 2018-03-19 DIAGNOSIS — R591 Generalized enlarged lymph nodes: Secondary | ICD-10-CM

## 2018-03-19 DIAGNOSIS — I639 Cerebral infarction, unspecified: Secondary | ICD-10-CM | POA: Diagnosis not present

## 2018-03-19 DIAGNOSIS — J189 Pneumonia, unspecified organism: Secondary | ICD-10-CM | POA: Diagnosis not present

## 2018-03-19 DIAGNOSIS — C9110 Chronic lymphocytic leukemia of B-cell type not having achieved remission: Secondary | ICD-10-CM | POA: Insufficient documentation

## 2018-03-19 LAB — COMP PANEL: LEUKEMIA/LYMPHOMA: Immunophenotypic Profile: 3

## 2018-03-19 NOTE — Patient Instructions (Addendum)
Take care Im glad your are better  Please cancel appt with Dr. Rockey Situ   Community-Acquired Pneumonia, Adult Pneumonia is an infection of the lungs. There are different types of pneumonia. One type can develop while a person is in a hospital. A different type, called community-acquired pneumonia, develops in people who are not, or have not recently been, in the hospital or other health care facility. What are the causes? Pneumonia may be caused by bacteria, viruses, or funguses. Community-acquired pneumonia is often caused by Streptococcus pneumonia bacteria. These bacteria are often passed from one person to another by breathing in droplets from the cough or sneeze of an infected person. What increases the risk? The condition is more likely to develop in:  People who havechronic diseases, such as chronic obstructive pulmonary disease (COPD), asthma, congestive heart failure, cystic fibrosis, diabetes, or kidney disease.  People who haveearly-stage or late-stage HIV.  People who havesickle cell disease.  People who havehad their spleen removed (splenectomy).  People who havepoor Human resources officer.  People who havemedical conditions that increase the risk of breathing in (aspirating) secretions their own mouth and nose.  People who havea weakened immune system (immunocompromised).  People who smoke.  People whotravel to areas where pneumonia-causing germs commonly exist.  People whoare around animal habitats or animals that have pneumonia-causing germs, including birds, bats, rabbits, cats, and farm animals.  What are the signs or symptoms? Symptoms of this condition include:  Adry cough.  A wet (productive) cough.  Fever.  Sweating.  Chest pain, especially when breathing deeply or coughing.  Rapid breathing or difficulty breathing.  Shortness of breath.  Shaking chills.  Fatigue.  Muscle aches.  How is this diagnosed? Your health care provider will take a  medical history and perform a physical exam. You may also have other tests, including:  Imaging studies of your chest, including X-rays.  Tests to check your blood oxygen level and other blood gases.  Other tests on blood, mucus (sputum), fluid around your lungs (pleural fluid), and urine.  If your pneumonia is severe, other tests may be done to identify the specific cause of your illness. How is this treated? The type of treatment that you receive depends on many factors, such as the cause of your pneumonia, the medicines you take, and other medical conditions that you have. For most adults, treatment and recovery from pneumonia may occur at home. In some cases, treatment must happen in a hospital. Treatment may include:  Antibiotic medicines, if the pneumonia was caused by bacteria.  Antiviral medicines, if the pneumonia was caused by a virus.  Medicines that are given by mouth or through an IV tube.  Oxygen.  Respiratory therapy.  Although rare, treating severe pneumonia may include:  Mechanical ventilation. This is done if you are not breathing well on your own and you cannot maintain a safe blood oxygen level.  Thoracentesis. This procedureremoves fluid around one lung or both lungs to help you breathe better.  Follow these instructions at home:  Take over-the-counter and prescription medicines only as told by your health care provider. ? Only takecough medicine if you are losing sleep. Understand that cough medicine can prevent your body's natural ability to remove mucus from your lungs. ? If you were prescribed an antibiotic medicine, take it as told by your health care provider. Do not stop taking the antibiotic even if you start to feel better.  Sleep in a semi-upright position at night. Try sleeping in a reclining chair, or  place a few pillows under your head.  Do not use tobacco products, including cigarettes, chewing tobacco, and e-cigarettes. If you need help  quitting, ask your health care provider.  Drink enough water to keep your urine clear or pale yellow. This will help to thin out mucus secretions in your lungs. How is this prevented? There are ways that you can decrease your risk of developing community-acquired pneumonia. Consider getting a pneumococcal vaccine if:  You are older than 82 years of age.  You are older than 82 years of age and are undergoing cancer treatment, have chronic lung disease, or have other medical conditions that affect your immune system. Ask your health care provider if this applies to you.  There are different types and schedules of pneumococcal vaccines. Ask your health care provider which vaccination option is best for you. You may also prevent community-acquired pneumonia if you take these actions:  Get an influenza vaccine every year. Ask your health care provider which type of influenza vaccine is best for you.  Go to the dentist on a regular basis.  Wash your hands often. Use hand sanitizer if soap and water are not available.  Contact a health care provider if:  You have a fever.  You are losing sleep because you cannot control your cough with cough medicine. Get help right away if:  You have worsening shortness of breath.  You have increased chest pain.  Your sickness becomes worse, especially if you are an older adult or have a weakened immune system.  You cough up blood. This information is not intended to replace advice given to you by your health care provider. Make sure you discuss any questions you have with your health care provider. Document Released: 10/28/2005 Document Revised: 03/07/2016 Document Reviewed: 02/22/2015 [employer] Interactive Patient Education  [employer].

## 2018-03-19 NOTE — Progress Notes (Signed)
Chief Complaint  Patient presents with  . Follow-up   F/u from sick visit 03/12/18 feeling better strength improved  1. CT chest with+ Left lung pna on levaquin 500 mg qod and has 2 more doses  2. MRI + old infarcts f/u with Dr. Nehemiah Massed had 24 hr holder, pending stress echo and US carotid and will f/u 04/01/18  3. C/o leukemia vs lymphoma pending tests with H/o Dr. Grayland Ormond saw 03/17/18 and will see again 04/02/18 leukocytosis improved 20.3 to 11.7  4. H/o recurrent pneumonia f/u with Dr. Leonidas Romberg sch 04/09/18 feeling better since started Levaquin 500 mg qod on 03/12/18   Review of Systems  Constitutional: Negative for weight loss.  HENT: Positive for hearing loss.   Eyes: Negative for blurred vision.  Respiratory: Negative for cough and shortness of breath.   Cardiovascular: Negative for chest pain.  Neurological: Negative for weakness.   Past Medical History:  Diagnosis Date  . Anemia   . Arthritis   . Esophageal reflux   . Hyperlipidemia   . Hypertension   . Sebaceous cyst    Past Surgical History:  Procedure Laterality Date  . HEMORRHOID SURGERY     Family History  Problem Relation Age of Onset  . Heart disease Mother   . Cancer Maternal Aunt        lung  . Lung cancer Maternal Aunt   . Leukemia Maternal Aunt    Social History   Socioeconomic History  . Marital status: Married    Spouse name: Kathreen Cornfield  . Number of children: 2  . Years of education: Not on file  . Highest education level: Not on file  Occupational History  . Not on file  Social Needs  . Financial resource strain: Not on file  . Food insecurity:    Worry: Not on file    Inability: Not on file  . Transportation needs:    Medical: Not on file    Non-medical: Not on file  Tobacco Use  . Smoking status: Former Smoker    Packs/day: 1.50    Years: 50.00    Pack years: 75.00    Types: Cigarettes    Last attempt to quit: 09/10/1999    Years since quitting: 18.5  . Smokeless tobacco: Never Used   Substance and Sexual Activity  . Alcohol use: No    Alcohol/week: 0.0 oz  . Drug use: No  . Sexual activity: Yes  Lifestyle  . Physical activity:    Days per week: Not on file    Minutes per session: Not on file  . Stress: Not on file  Relationships  . Social connections:    Talks on phone: Not on file    Gets together: Not on file    Attends religious service: Not on file    Active member of club or organization: Not on file    Attends meetings of clubs or organizations: Not on file    Relationship status: Not on file  . Intimate partner violence:    Fear of current or ex partner: Not on file    Emotionally abused: Not on file    Physically abused: Not on file    Forced sexual activity: Not on file  Other Topics Concern  . Not on file  Social History Narrative   Lives in [place] with wife. Dog and cat in home.      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  Dietitian.      Diet -  regular      Exercise - Walking   Current Meds  Medication Sig  . albuterol (PROVENTIL HFA;VENTOLIN HFA) 108 (90 Base) MCG/ACT inhaler Inhale 1-2 puffs into the lungs every 4 (four) hours as needed for wheezing or shortness of breath.  Marland Kitchen amLODipine (NORVASC) 5 MG tablet Take 1 tablet (5 mg total) by mouth 2 (two) times daily.  Marland Kitchen aspirin EC 81 MG tablet Take by mouth.  Marland Kitchen atorvastatin (LIPITOR) 10 MG tablet Take 1 tablet (10 mg total) by mouth at bedtime.  . cholecalciferol (VITAMIN D) 1000 UNITS tablet Take 1,000 Units by mouth daily.  Marland Kitchen dutasteride (AVODART) 0.5 MG capsule Take 0.5 mg by mouth 2 (two) times a week.  Marland Kitchen ketoconazole (NIZORAL) 2 % cream Apply 1 application topically daily. To 2-3 skin lesions  . ketoconazole (NIZORAL) 2 % cream   . levofloxacin (LEVAQUIN) 750 MG tablet Take 1 tablet (750 mg total) by mouth every other day.  . Multiple Vitamins-Minerals (MEGA MULTIVITAMIN FOR MEN) TABS Take 2 capsules by mouth daily.  Marland Kitchen omeprazole (PRILOSEC) 20 MG capsule Take 1 capsule (20 mg  total) by mouth daily.  . sertraline (ZOLOFT) 50 MG tablet Take 1 tablet (50 mg total) by mouth daily.  . Tamsulosin HCl (FLOMAX) 0.4 MG CAPS Take 0.4 mg by mouth daily.  Marland Kitchen tiotropium (SPIRIVA) 18 MCG inhalation capsule Place 18 mcg into inhaler and inhale daily.  Marland Kitchen triamcinolone cream (KENALOG) 0.1 %   . valACYclovir (VALTREX) 500 MG tablet Take 1 tablet (500 mg total) by mouth every other day.   Allergies  Allergen Reactions  . Cephalexin Rash   Recent Results (from the past 2160 hour(s))  CBC     Status: Abnormal   Collection Time: 01/03/18  3:00 PM  Result Value Ref Range   WBC 24.1 (H) 3.8 - 10.6 K/uL   RBC 4.25 (L) 4.40 - 5.90 MIL/uL   Hemoglobin 12.1 (L) 13.0 - 18.0 g/dL   HCT 36.9 (L) 40.0 - 52.0 %   MCV 86.7 80.0 - 100.0 fL   MCH 28.5 26.0 - 34.0 pg   MCHC 32.9 32.0 - 36.0 g/dL   RDW 13.9 11.5 - 14.5 %   Platelets 246 150 - 440 K/uL    Comment: Performed at [facility], [address] [place], [place] [address]  Troponin I     Status: Abnormal   Collection Time: 01/03/18  3:00 PM  Result Value Ref Range   Troponin I 0.06 (HH) <0.03 ng/mL    Comment: CRITICAL RESULT CALLED TO, READ BACK BY AND VERIFIED WITH MONICA MOON AT 1536 ON 01/03/18 BY SNJ Performed at [facility], [address] [place], [place] [address]   Comprehensive metabolic panel     Status: Abnormal   Collection Time: 01/03/18  3:00 PM  Result Value Ref Range   Sodium 138 135 - 145 mmol/L   Potassium 4.0 3.5 - 5.1 mmol/L   Chloride 109 101 - 111 mmol/L   CO2 18 (L) 22 - 32 mmol/L   Glucose, Bld 167 (H) 65 - 99 mg/dL   BUN 28 (H) 6 - 20 mg/dL   Creatinine, Ser 1.46 (H) 0.61 - 1.24 mg/dL   Calcium 9.2 8.9 - 10.3 mg/dL   Total Protein 7.4 6.5 - 8.1 g/dL   Albumin 4.1 3.5 - 5.0 g/dL   AST 31 15 - 41 U/L   ALT 23 17 - 63 U/L   Alkaline Phosphatase 72 38 - 126 U/L  Total Bilirubin 0.4 0.3 - 1.2 mg/dL   GFR calc non Af Amer 43 (L) >60 mL/min   GFR calc Af Amer 49 (L) >60  mL/min    Comment: (NOTE) The eGFR has been calculated using the CKD EPI equation. This calculation has not been validated in all clinical situations. eGFR's persistently <60 mL/min signify possible Chronic Kidney Disease.    Anion gap 11 5 - 15    Comment: Performed at [facility], [address] [place], [place] [address]  Influenza panel by PCR (type A & B)     Status: None   Collection Time: 01/03/18  4:54 PM  Result Value Ref Range   Influenza A By PCR NEGATIVE NEGATIVE   Influenza B By PCR NEGATIVE NEGATIVE    Comment: (NOTE) The Xpert Xpress Flu assay is intended as an aid in the diagnosis of  influenza and should not be used as a sole basis for treatment.  This  assay is FDA approved for nasopharyngeal swab specimens only. Nasal  washings and aspirates are unacceptable for Xpert Xpress Flu testing. Performed at [facility], [address] [place], [place] [address]   Blood culture (routine x 2)     Status: None   Collection Time: 01/03/18  4:54 PM  Result Value Ref Range   Specimen Description BLOOD RIGHT ANTECUBITAL    Special Requests      BOTTLES DRAWN AEROBIC AND ANAEROBIC Blood Culture results may not be optimal due to an excessive volume of blood received in culture bottles   Culture      NO GROWTH 5 DAYS Performed at [facility], [address] [place], [place] [address]    Report Status 01/08/2018 FINAL   Blood culture (routine x 2)     Status: None   Collection Time: 01/03/18  4:54 PM  Result Value Ref Range   Specimen Description BLOOD LEFT ANTECUBITAL    Special Requests      BOTTLES DRAWN AEROBIC AND ANAEROBIC Blood Culture results may not be optimal due to an excessive volume of blood received in culture bottles   Culture      NO GROWTH 5 DAYS Performed at [facility], [address] [place], [place] [address]    Report Status 01/08/2018 FINAL   Procalcitonin - Baseline     Status: None   Collection  Time: 01/03/18  5:30 PM  Result Value Ref Range   Procalcitonin 0.75 ng/mL    Comment:        Interpretation: PCT > 0.5 ng/mL and <= 2 ng/mL: Systemic infection (sepsis) is possible, but other conditions are known to elevate PCT as well. (NOTE)       Sepsis PCT Algorithm           Lower Respiratory Tract                                      Infection PCT Algorithm    ----------------------------     ----------------------------         PCT < 0.25 ng/mL                PCT < 0.10 ng/mL         Strongly encourage             Strongly discourage   discontinuation of antibiotics    initiation of antibiotics    ----------------------------     -----------------------------  PCT 0.25 - 0.50 ng/mL            PCT 0.10 - 0.25 ng/mL               OR       >80% decrease in PCT            Discourage initiation of                                            antibiotics      Encourage discontinuation           of antibiotics    ----------------------------     -----------------------------         PCT >= 0.50 ng/mL              PCT 0.26 - 0.50 ng/mL                AND       <80% decrease in PCT             Encourage initiation of                                             antibiotics       Encourage continuation           of antibiotics    ----------------------------     -----------------------------        PCT >= 0.50 ng/mL                  PCT > 0.50 ng/mL               AND         increase in PCT                  Strongly encourage                                      initiation of antibiotics    Strongly encourage escalation           of antibiotics                                     -----------------------------                                           PCT <= 0.25 ng/mL                                                 OR                                        > 80% decrease in PCT  Discontinue / Do not initiate                                              antibiotics Performed at [facility], [address] [place], [place] [address]   Troponin I     Status: Abnormal   Collection Time: 01/03/18  9:23 PM  Result Value Ref Range   Troponin I 0.05 (HH) <0.03 ng/mL    Comment: CRITICAL VALUE NOTED. VALUE IS CONSISTENT WITH PREVIOUSLY REPORTED/CALLED VALUE.PMH Performed at [facility], [address] [place], [place] [address]   Procalcitonin     Status: None   Collection Time: 01/04/18  1:43 AM  Result Value Ref Range   Procalcitonin 0.70 ng/mL    Comment:        Interpretation: PCT > 0.5 ng/mL and <= 2 ng/mL: Systemic infection (sepsis) is possible, but other conditions are known to elevate PCT as well. (NOTE)       Sepsis PCT Algorithm           Lower Respiratory Tract                                      Infection PCT Algorithm    ----------------------------     ----------------------------         PCT < 0.25 ng/mL                PCT < 0.10 ng/mL         Strongly encourage             Strongly discourage   discontinuation of antibiotics    initiation of antibiotics    ----------------------------     -----------------------------       PCT 0.25 - 0.50 ng/mL            PCT 0.10 - 0.25 ng/mL               OR       >80% decrease in PCT            Discourage initiation of                                            antibiotics      Encourage discontinuation           of antibiotics    ----------------------------     -----------------------------         PCT >= 0.50 ng/mL              PCT 0.26 - 0.50 ng/mL                AND       <80% decrease in PCT             Encourage initiation of                                             antibiotics       Encourage continuation  of antibiotics    ----------------------------     -----------------------------        PCT >= 0.50 ng/mL                  PCT > 0.50 ng/mL               AND         increase in PCT                  Strongly  encourage                                      initiation of antibiotics    Strongly encourage escalation           of antibiotics                                     -----------------------------                                           PCT <= 0.25 ng/mL                                                 OR                                        > 80% decrease in PCT                                     Discontinue / Do not initiate                                             antibiotics Performed at [facility], [address] [place], [place] [address]   CBC     Status: Abnormal   Collection Time: 01/04/18  1:43 AM  Result Value Ref Range   WBC 17.0 (H) 3.8 - 10.6 K/uL   RBC 4.09 (L) 4.40 - 5.90 MIL/uL   Hemoglobin 11.8 (L) 13.0 - 18.0 g/dL   HCT 35.6 (L) 40.0 - 52.0 %   MCV 87.2 80.0 - 100.0 fL   MCH 28.8 26.0 - 34.0 pg   MCHC 33.1 32.0 - 36.0 g/dL   RDW 14.4 11.5 - 14.5 %   Platelets 203 150 - 440 K/uL    Comment: Performed at [facility], [address] [place], [place] [address]  Basic metabolic panel     Status: Abnormal   Collection Time: 01/04/18  1:43 AM  Result Value Ref Range   Sodium 137 135 - 145 mmol/L   Potassium 3.6 3.5 - 5.1 mmol/L   Chloride 107 101 - 111 mmol/L   CO2 20 (L) 22 - 32 mmol/L   Glucose, Bld 109 (H) 65 - 99  mg/dL   BUN 30 (H) 6 - 20 mg/dL   Creatinine, Ser 1.36 (H) 0.61 - 1.24 mg/dL   Calcium 8.7 (L) 8.9 - 10.3 mg/dL   GFR calc non Af Amer 46 (L) >60 mL/min   GFR calc Af Amer 54 (L) >60 mL/min    Comment: (NOTE) The eGFR has been calculated using the CKD EPI equation. This calculation has not been validated in all clinical situations. eGFR's persistently <60 mL/min signify possible Chronic Kidney Disease.    Anion gap 10 5 - 15    Comment: Performed at [facility], [address] [place], [place] [address]  Troponin I     Status: Abnormal   Collection Time: 01/04/18  1:43 AM  Result Value Ref Range    Troponin I 0.03 (HH) <0.03 ng/mL    Comment: CRITICAL VALUE NOTED. VALUE IS CONSISTENT WITH PREVIOUSLY REPORTED/CALLED VALUE.PMH Performed at [facility], [address] [place], [place] [address]   Troponin I     Status: None   Collection Time: 01/04/18  5:37 AM  Result Value Ref Range   Troponin I <0.03 <0.03 ng/mL    Comment: Performed at [facility], [address] [place], [place] [address]  HgB A1c     Status: Abnormal   Collection Time: 03/02/18 10:07 AM  Result Value Ref Range   Hgb A1c MFr Bld 6.6 (H) 4.6 - 6.5 %    Comment: Glycemic Control Guidelines for People with Diabetes:Non Diabetic:  <6%Goal of Therapy: <7%Additional Action Suggested:  >8%   Comp Met (CMET)     Status: Abnormal   Collection Time: 03/02/18 10:07 AM  Result Value Ref Range   Sodium 137 135 - 145 mEq/L   Potassium 4.8 3.5 - 5.1 mEq/L   Chloride 102 96 - 112 mEq/L   CO2 26 19 - 32 mEq/L   Glucose, Bld 99 70 - 99 mg/dL   BUN 22 6 - 23 mg/dL   Creatinine, Ser 1.30 0.40 - 1.50 mg/dL   Total Bilirubin 0.3 0.2 - 1.2 mg/dL   Alkaline Phosphatase 80 39 - 117 U/L   AST 24 0 - 37 U/L   ALT 24 0 - 53 U/L   Total Protein 7.2 6.0 - 8.3 g/dL   Albumin 4.3 3.5 - 5.2 g/dL   Calcium 9.7 8.4 - 10.5 mg/dL   GFR 56.00 (L) >60.00 mL/min  CBC w/Diff     Status: Abnormal   Collection Time: 03/12/18  3:10 PM  Result Value Ref Range   WBC 20.3 Repeated and verified X2. (HH) 4.0 - 10.5 K/uL   RBC 4.22 4.22 - 5.81 Mil/uL   Hemoglobin 12.0 (L) 13.0 - 17.0 g/dL   HCT 36.2 (L) 39.0 - 52.0 %   MCV 85.9 78.0 - 100.0 fl   MCHC 33.0 30.0 - 36.0 g/dL   RDW 14.2 11.5 - 15.5 %   Platelets 227.0 150.0 - 400.0 K/uL   Neutrophils Relative % 80.8 (H) 43.0 - 77.0 %   Lymphocytes Relative 10.1 (L) 12.0 - 46.0 %   Monocytes Relative 7.1 3.0 - 12.0 %   Eosinophils Relative 1.1 0.0 - 5.0 %   Basophils Relative 0.9 0.0 - 3.0 %   Neutro Abs 16.4 (H) 1.4 - 7.7 K/uL   Lymphs Abs 2.0 0.7 - 4.0 K/uL   Monocytes  Absolute 1.4 (H) 0.1 - 1.0 K/uL   Eosinophils Absolute 0.2 0.0 - 0.7 K/uL   Basophils Absolute 0.2 (H) 0.0 -  0.1 K/uL  Basic Metabolic Panel (BMET)     Status: Abnormal   Collection Time: 03/12/18  3:10 PM  Result Value Ref Range   Sodium 135 135 - 145 mEq/L   Potassium 4.1 3.5 - 5.1 mEq/L   Chloride 101 96 - 112 mEq/L   CO2 22 19 - 32 mEq/L   Glucose, Bld 122 (H) 70 - 99 mg/dL   BUN 19 6 - 23 mg/dL   Creatinine, Ser 1.63 (H) 0.40 - 1.50 mg/dL   Calcium 9.1 8.4 - 10.5 mg/dL   GFR 43.13 (L) >60.00 mL/min  Urinalysis, Routine w reflex microscopic     Status: Abnormal   Collection Time: 03/12/18  3:10 PM  Result Value Ref Range   Specific Gravity, UA 1.022 1.005 - 1.030   pH, UA 6.0 5.0 - 7.5   Color, UA Yellow Yellow   Appearance Ur Clear Clear   Leukocytes, UA Negative Negative   Protein, UA 2+ (A) Negative/Trace   Glucose, UA Negative Negative   Ketones, UA Negative Negative   RBC, UA Trace (A) Negative   Bilirubin, UA Negative Negative   Urobilinogen, Ur 0.2 0.2 - 1.0 mg/dL   Nitrite, UA Negative Negative   Microscopic Examination See below:     Comment: Microscopic was indicated and was performed.  Urine Culture     Status: None   Collection Time: 03/12/18  3:10 PM  Result Value Ref Range   Urine Culture, Routine Final report    Organism ID, Bacteria No growth   Microscopic Examination     Status: None   Collection Time: 03/12/18  3:10 PM  Result Value Ref Range   WBC, UA 0-5 0 - 5 /hpf   RBC, UA 0-2 0 - 2 /hpf   Epithelial Cells (non renal) 0-10 0 - 10 /hpf   Casts None seen None seen /lpf   Mucus, UA Present Not Estab.   Bacteria, UA Few None seen/Few  Lactate dehydrogenase     Status: None   Collection Time: 03/17/18 12:00 PM  Result Value Ref Range   LDH 155 98 - 192 U/L    Comment: Performed at [facility], [address] [place], [place] [address]  CBC with Differential/Platelet     Status: Abnormal   Collection Time: 03/17/18 12:00 PM  Result  Value Ref Range   WBC 11.7 (H) 3.8 - 10.6 K/uL   RBC 4.10 (L) 4.40 - 5.90 MIL/uL   Hemoglobin 11.6 (L) 13.0 - 18.0 g/dL   HCT 35.1 (L) 40.0 - 52.0 %   MCV 85.6 80.0 - 100.0 fL   MCH 28.3 26.0 - 34.0 pg   MCHC 33.1 32.0 - 36.0 g/dL   RDW 14.5 11.5 - 14.5 %   Platelets 295 150 - 440 K/uL   Neutrophils Relative % 58 %   Neutro Abs 6.8 (H) 1.4 - 6.5 K/uL   Lymphocytes Relative 23 %   Lymphs Abs 2.6 1.0 - 3.6 K/uL   Monocytes Relative 9 %   Monocytes Absolute 1.0 0.2 - 1.0 K/uL   Eosinophils Relative 9 %   Eosinophils Absolute 1.1 (H) 0 - 0.7 K/uL   Basophils Relative 1 %   Basophils Absolute 0.1 0 - 0.1 K/uL    Comment: Performed at [facility], [address] [place], [place] [address]   Objective  Body mass index is 33.09 kg/m. Wt Readings from Last 3 Encounters:  03/19/18 217 lb 9.6 oz (98.7 kg)  03/17/18  215 lb (97.5 kg)  03/12/18 216 lb 12.8 oz (98.3 kg)   Temp Readings from Last 3 Encounters:  03/19/18 98.3 F (36.8 C) (Oral)  03/17/18 (!) 97.4 F (36.3 C) (Tympanic)  03/12/18 99.3 F (37.4 C) (Oral)   BP Readings from Last 3 Encounters:  03/19/18 (!) 146/54  03/17/18 (!) 141/62  03/12/18 130/66   Pulse Readings from Last 3 Encounters:  03/19/18 78  03/17/18 61  03/12/18 96    Physical Exam  Constitutional: He is oriented to person, place, and time. Vital signs are normal. He appears well-developed and well-nourished. He is cooperative.  HENT:  Head: Normocephalic and atraumatic.  Mouth/Throat: Oropharynx is clear and moist and mucous membranes are normal.  Eyes: Pupils are equal, round, and reactive to light. Conjunctivae are normal.  Cardiovascular: Normal rate and regular rhythm.  Murmur heard. Pulmonary/Chest: Effort normal and breath sounds normal. He has no wheezes.  Neurological: He is alert and oriented to person, place, and time. Gait normal.  Not using walker today  Skin: Skin is warm, dry and intact. Rash noted.  Annual scaly rash to  trunk   Psychiatric: He has a normal mood and affect. His speech is normal and behavior is normal. Judgment and thought content normal. Cognition and memory are normal.  Nursing note and vitals reviewed.   Assessment   1. Left pneumonia with h/o recurrent pneumonia  2. Old infarcts + MRI 3. 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 2 more doses left levaquin 500 mg qod  Better  Wbc improved  Will need updated pna 23 in future  F/u Dr. Baruch Merl 04/09/18   2. Pending holter results 24 hr, pending to do stress echo and carotid US  F/u 04/01/18 with Dr. Nehemiah Massed  3. Pending tests with Dr. Grayland Ormond and due to f/u 04/02/18   Provider: Dr. Olivia Mackie McLean-Scocuzza-Internal Medicine

## 2018-03-19 NOTE — Progress Notes (Signed)
Pre visit review using our clinic review tool, if applicable. No additional management support is needed unless otherwise documented below in the visit note. 

## 2018-03-20 NOTE — Progress Notes (Signed)
Donald [name]  [phone] [phone]) [phone]  ID: Donald [name] OB: 1933/04/20  MR#: [id number]  MWN#:[id number]  Patient Care Team: [name], MD as PCP - General (Family Medicine) Rockey Situ Kathlene November, MD as Consulting Physician (Cardiology)  CHIEF COMPLAINT: Patient here.  INTERVAL HISTORY: Patient is an 84 year old male who was noted to have a persistently elevated white blood cell count as well as increased mediastinal, hilar, and axillary lymphadenopathy on CT scan on Mar 12, 2018.  He currently feels well and is asymptomatic.  He denies any recent fevers or illnesses.  He has a good appetite and denies weight loss.  He denies any night sweats.  He has no chest pain, cough, hemoptysis, or shortness of breath.  He denies any nausea, vomiting, constipation, or diarrhea.  He has no urinary complaints.  Patient feels at his baseline offers no specific complaints today.  REVIEW OF SYSTEMS:   Review of Systems  Constitutional: Negative.  Negative for fever, malaise/fatigue and weight loss. Diaphoresis:    Respiratory: Negative.  Negative for cough, hemoptysis and shortness of breath.   Cardiovascular: Negative.  Negative for chest pain and leg swelling.  Gastrointestinal: Negative.  Negative for abdominal pain.  Genitourinary: Negative.  Negative for dysuria.  Musculoskeletal: Negative.  Negative for back pain.  Skin: Negative.  Negative for rash.  Neurological: Negative.  Negative for sensory change, focal weakness and weakness.  Endo/Heme/Allergies: Does not bruise/bleed easily.  Psychiatric/Behavioral: Negative.  The patient is not nervous/anxious.     As per HPI. Otherwise, a complete review of systems is negative.  PAST MEDICAL HISTORY: Past Medical History:  Diagnosis Date  . Anemia   . Arthritis   . Esophageal reflux   . Hyperlipidemia   . Hypertension   . Pneumonia    recurrent  . Sebaceous cyst     PAST SURGICAL  HISTORY: Past Surgical History:  Procedure Laterality Date  . HEMORRHOID SURGERY      FAMILY HISTORY: Family History  Problem Relation Age of Onset  . Heart disease Mother   . Cancer Maternal Aunt        lung  . Lung cancer Maternal Aunt   . Leukemia Maternal Aunt     ADVANCED DIRECTIVES (Y/N):  N  HEALTH MAINTENANCE: Social History   Tobacco Use  . Smoking status: Former Smoker    Packs/day: 1.50    Years: 50.00    Pack years: 75.00    Types: Cigarettes    Last attempt to quit: 09/10/1999    Years since quitting: 18.5  . Smokeless tobacco: Never Used  Substance Use Topics  . Alcohol use: No    Alcohol/week: 0.0 oz  . Drug use: No     Colonoscopy:  PAP:  Bone density:  Lipid panel:  Allergies  Allergen Reactions  . Cephalexin Rash    Current Outpatient Medications  Medication Sig Dispense Refill  . albuterol (PROVENTIL HFA;VENTOLIN HFA) 108 (90 Base) MCG/ACT inhaler Inhale 1-2 puffs into the lungs every 4 (four) hours as needed for wheezing or shortness of breath. 1 Inhaler 10  . amLODipine (NORVASC) 5 MG tablet Take 1 tablet (5 mg total) by mouth 2 (two) times daily. 180 tablet 1  . aspirin EC 81 MG tablet Take by mouth.    Marland Kitchen atorvastatin (LIPITOR) 10 MG tablet Take 1 tablet (10 mg total) by mouth at bedtime. 90 tablet 3  . cholecalciferol (VITAMIN D) 1000 UNITS tablet Take 1,000 Units by mouth daily.    Marland Kitchen  dutasteride (AVODART) 0.5 MG capsule Take 0.5 mg by mouth 2 (two) times a week.    Marland Kitchen ketoconazole (NIZORAL) 2 % cream Apply 1 application topically daily. To 2-3 skin lesions 15 g 0  . levofloxacin (LEVAQUIN) 750 MG tablet Take 1 tablet (750 mg total) by mouth every other day. 5 tablet 0  . Multiple Vitamins-Minerals (MEGA MULTIVITAMIN FOR MEN) TABS Take 2 capsules by mouth daily.    Marland Kitchen omeprazole (PRILOSEC) 20 MG capsule Take 1 capsule (20 mg total) by mouth daily. 30 capsule 6  . sertraline (ZOLOFT) 50 MG tablet Take 1 tablet (50 mg total) by mouth daily.  30 tablet 3  . Tamsulosin HCl (FLOMAX) 0.4 MG CAPS Take 0.4 mg by mouth daily.    Marland Kitchen tiotropium (SPIRIVA) 18 MCG inhalation capsule Place 18 mcg into inhaler and inhale daily.    . valACYclovir (VALTREX) 500 MG tablet Take 1 tablet (500 mg total) by mouth every other day. 15 tablet 3  . ketoconazole (NIZORAL) 2 % cream     . triamcinolone cream (KENALOG) 0.1 %      No current facility-administered medications for this visit.     OBJECTIVE: Vitals:   03/17/18 1129  BP: (!) 141/62  Pulse: 61  Resp: 18  Temp: (!) 97.4 F (36.3 C)     Body mass index is 32.69 kg/m.    ECOG FS:0 - Asymptomatic  General: Well-developed, well-nourished, no acute distress. Eyes: Pink conjunctiva, anicteric sclera. HEENT: Normocephalic, moist mucous membranes, clear oropharnyx. Lungs: Clear to auscultation bilaterally. Heart: Regular rate and rhythm. No rubs, murmurs, or gallops. Abdomen: Soft, nontender, nondistended. No organomegaly noted, normoactive bowel sounds. Musculoskeletal: No edema, cyanosis, or clubbing. Neuro: Alert, answering all questions appropriately. Cranial nerves grossly intact. Skin: No rashes or petechiae noted. Psych: Normal affect.  LAB RESULTS:  Lab Results  Component Value Date   NA 135 03/12/2018   K 4.1 03/12/2018   CL 101 03/12/2018   CO2 22 03/12/2018   GLUCOSE 122 (H) 03/12/2018   BUN 19 03/12/2018   CREATININE 1.63 (H) 03/12/2018   CALCIUM 9.1 03/12/2018   PROT 7.2 03/02/2018   ALBUMIN 4.3 03/02/2018   AST 24 03/02/2018   ALT 24 03/02/2018   ALKPHOS 80 03/02/2018   BILITOT 0.3 03/02/2018   GFRNONAA 46 (L) 01/04/2018   GFRAA 54 (L) 01/04/2018    Lab Results  Component Value Date   WBC 11.7 (H) 03/17/2018   NEUTROABS 6.8 (H) 03/17/2018   HGB 11.6 (L) 03/17/2018   HCT 35.1 (L) 03/17/2018   MCV 85.6 03/17/2018   PLT 295 03/17/2018     STUDIES: Ct Chest W Contrast  Result Date: 03/12/2018 CLINICAL DATA:  Patient with history of pneumonia and prior  chest CT. Follow-up evaluation. EXAM: CT CHEST WITH CONTRAST TECHNIQUE: Multidetector CT imaging of the chest was performed during intravenous contrast administration. CONTRAST:  50mL ISOVUE-370 IOPAMIDOL (ISOVUE-370) INJECTION 76% COMPARISON:  Chest CT 01/03/2018 FINDINGS: Cardiovascular: Heart is mildly enlarged. Coronary arterial vascular calcifications. Thoracic aortic vascular calcifications. Mediastinum/Nodes: Grossly unchanged extensive mediastinal, hilar and bilateral axillary adenopathy. Similar-appearing 2.3 cm subcarinal lymph node (image 72; series 2), previously 2.4 cm. Similar 1.9 cm right paratracheal lymph node (image 43; series 2), previously 2.1 cm. Similar 1.1 cm right axillary lymph node (image 29; series 2), previously 1.0 cm. Small hiatal hernia. Esophagus is normal in appearance. Lungs/Pleura: Central airways are patent. Minimal dependent atelectasis right lower lobe. Interval resolution of previously described medial right lower lobe nodular  opacity most compatible with resolved infectious process. Interval increase in left lower lobe patchy ground-glass opacities. Unchanged 4 mm left upper lobe nodule (image 28; series 3). No pleural effusion or pneumothorax. Upper Abdomen: No acute process. Musculoskeletal: Thoracic spine degenerative changes. No aggressive or acute appearing osseous lesions. IMPRESSION: 1. Persistent mediastinal and axillary lymphadenopathy which is nonspecific however concerning for the possibility of lymphoma given persistent nature, size and distribution. Recommend clinical and laboratory correlation. Consider percutaneous sampling of 1 of the lymph nodes as clinically indicated. 2. Interval resolution of previously described right lower lobe nodule compatible with resolved infectious/inflammatory process. 3. Persistent to increased left lower lobe ground-glass and patchy consolidative opacities which may represent worsening infectious/inflammatory process. Recommend  short-term follow-up chest CT (2-3 months) to assess for interval change. 4. Stable 4 mm left upper lobe nodule. Recommend attention on follow-up. 5. Aortic Atherosclerosis (ICD10-I70.0). Electronically Signed   By: Lovey Newcomer M.D.   On: 03/12/2018 17:50   Mr Brain Wo Contrast  Result Date: 03/12/2018 CLINICAL DATA:  84 y/o M; sudden onset confusion and loss of ability to walk today. History of multiple falls. EXAM: MRI HEAD WITHOUT CONTRAST TECHNIQUE: Multiplanar, multiecho pulse sequences of the brain and surrounding structures were obtained without intravenous contrast. COMPARISON:  None. FINDINGS: Brain: No acute infarction, hemorrhage, hydrocephalus, extra-axial collection or mass lesion. Small chronic lacunar infarcts are present in right frontal periventricular white matter in the caudate body. Mild chronic microvascular ischemic changes and parenchymal volume loss of the brain for age. Vascular: Normal flow voids. Skull and upper cervical spine: Normal marrow signal. Sinuses/Orbits: Mild diffuse paranasal sinus disease. Trace opacification of the tips of mastoid air cells. Bilateral intra-ocular lens replacement. Other: None. IMPRESSION: 1. No acute intracranial abnormality identified. 2. Small chronic lacunar infarcts in right frontal periventricular white matter and right caudate body. 3. Mild for age chronic microvascular ischemic changes and parenchymal volume loss of the brain. 4. Mild paranasal sinus disease. Electronically Signed   By: Kristine Garbe M.D.   On: 03/12/2018 19:35   US Venous Img Lower Bilateral  Result Date: 03/12/2018 CLINICAL DATA:  84 year old male with a history of leg edema EXAM: BILATERAL LOWER EXTREMITY VENOUS DOPPLER ULTRASOUND TECHNIQUE: Gray-scale sonography with graded compression, as well as color Doppler and duplex ultrasound were performed to evaluate the lower extremity deep venous systems from the level of the common femoral vein and including the common  femoral, femoral, profunda femoral, popliteal and calf veins including the posterior tibial, peroneal and gastrocnemius veins when visible. The superficial great saphenous vein was also interrogated. Spectral Doppler was utilized to evaluate flow at rest and with distal augmentation maneuvers in the common femoral, femoral and popliteal veins. COMPARISON:  None. FINDINGS: RIGHT LOWER EXTREMITY Common Femoral Vein: No evidence of thrombus. Normal compressibility, respiratory phasicity and response to augmentation. Saphenofemoral Junction: No evidence of thrombus. Normal compressibility and flow on color Doppler imaging. Profunda Femoral Vein: No evidence of thrombus. Normal compressibility and flow on color Doppler imaging. Femoral Vein: No evidence of thrombus. Normal compressibility, respiratory phasicity and response to augmentation. Popliteal Vein: No evidence of thrombus. Normal compressibility, respiratory phasicity and response to augmentation. Calf Veins: No evidence of thrombus. Normal compressibility and flow on color Doppler imaging. Superficial Great Saphenous Vein: No evidence of thrombus. Normal compressibility and flow on color Doppler imaging. Other Findings:  None. LEFT LOWER EXTREMITY Common Femoral Vein: No evidence of thrombus. Normal compressibility, respiratory phasicity and response to augmentation. Saphenofemoral Junction: No evidence of  thrombus. Normal compressibility and flow on color Doppler imaging. Profunda Femoral Vein: No evidence of thrombus. Normal compressibility and flow on color Doppler imaging. Femoral Vein: No evidence of thrombus. Normal compressibility, respiratory phasicity and response to augmentation. Popliteal Vein: No evidence of thrombus. Normal compressibility, respiratory phasicity and response to augmentation. Calf Veins: No evidence of thrombus. Normal compressibility and flow on color Doppler imaging. Superficial Great Saphenous Vein: No evidence of thrombus. Normal  compressibility and flow on color Doppler imaging. Other Findings:  None. IMPRESSION: Sonographic survey of the bilateral lower extremities negative for DVT Electronically Signed   By: Corrie Mckusick D.O.   On: 03/12/2018 17:06    ASSESSMENT: Lymphadenopathy, leukocytosis.  PLAN:   1. Lymphadenopathy, leukocytosis: CT scan results reviewed independently and report as above with suspicious lymphadenopathy in the patient's mediastinum, hilar, and bilateral axilla.  Given the results of his flow cytometry panel which include a CD5+ and CD23+ clonal B-cell population, this is highly suspicious for CLL.  Will get a PET scan for further evaluation.  Return to clinic in 1 to 2 weeks after his PET scan to discuss the results and treatment planning if necessary.  Patient will possibly require biopsy which can be determined once the results of the PET scan are finalized.  Approximately 60 minutes spent in discussion of which greater than 50% was consultation.  Patient expressed understanding and was in agreement with this plan. He also understands that He can call clinic at any time with any questions, concerns, or complaints.   Cancer Staging No matching staging information was found for the patient.  Lloyd Huger, MD   03/20/2018 1:59 PM

## 2018-03-23 ENCOUNTER — Ambulatory Visit: Payer: Medicare HMO

## 2018-03-23 LAB — BCR-ABL1, CML/ALL, PCR, QUANT

## 2018-03-24 ENCOUNTER — Telehealth: Payer: Self-pay

## 2018-03-24 NOTE — Telephone Encounter (Signed)
Please let the patient know that we received the dermatology note.  Based on review of this and the information I researched regarding the type of rash they believed he had I am not sure an oral steroid is going to be beneficial for this.  He should contact his dermatologist to see if there is additional therapy if they could provide.

## 2018-03-24 NOTE — Telephone Encounter (Signed)
Patient states he was seen 03/02/18 and he states he states the cream is not helping and he would like to try the pill that was mentioned [place]

## 2018-03-25 NOTE — Telephone Encounter (Signed)
Patient notified

## 2018-03-29 NOTE — Progress Notes (Deleted)
[name]  Telephone:(336) [phone] Fax:(336) [phone]  ID: Donald [name]: 25-May-1933  MR#: [id number]  YHC#:[id number]  Patient Care Team: [name], MD as PCP - General (Family Medicine) Rockey Situ Kathlene November, MD as Consulting Physician (Cardiology)  CHIEF COMPLAINT: Lymphadenopathy, leukocytosis.  INTERVAL HISTORY: Patient is an 84 year old male who was noted to have a persistently elevated white blood cell count as well as increased mediastinal, hilar, and axillary lymphadenopathy on CT scan on Mar 12, 2018.  He currently feels well and is asymptomatic.  He denies any recent fevers or illnesses.  He has a good appetite and denies weight loss.  He denies any night sweats.  He has no chest pain, cough, hemoptysis, or shortness of breath.  He denies any nausea, vomiting, constipation, or diarrhea.  He has no urinary complaints.  Patient feels at his baseline offers no specific complaints today.  REVIEW OF SYSTEMS:   Review of Systems  Constitutional: Negative.  Negative for fever, malaise/fatigue and weight loss. Diaphoresis:    Respiratory: Negative.  Negative for cough, hemoptysis and shortness of breath.   Cardiovascular: Negative.  Negative for chest pain and leg swelling.  Gastrointestinal: Negative.  Negative for abdominal pain.  Genitourinary: Negative.  Negative for dysuria.  Musculoskeletal: Negative.  Negative for back pain.  Skin: Negative.  Negative for rash.  Neurological: Negative.  Negative for sensory change, focal weakness and weakness.  Endo/Heme/Allergies: Does not bruise/bleed easily.  Psychiatric/Behavioral: Negative.  The patient is not nervous/anxious.     As per HPI. Otherwise, a complete review of systems is negative.  PAST MEDICAL HISTORY: Past Medical History:  Diagnosis Date  . Anemia   . Arthritis   . Esophageal reflux   . Hyperlipidemia   . Hypertension   . Pneumonia    recurrent  . Sebaceous cyst     PAST  SURGICAL HISTORY: Past Surgical History:  Procedure Laterality Date  . HEMORRHOID SURGERY      FAMILY HISTORY: Family History  Problem Relation Age of Onset  . Heart disease Mother   . Cancer Maternal Aunt        lung  . Lung cancer Maternal Aunt   . Leukemia Maternal Aunt     ADVANCED DIRECTIVES (Y/N):  N  HEALTH MAINTENANCE: Social History   Tobacco Use  . Smoking status: Former Smoker    Packs/day: 1.50    Years: 50.00    Pack years: 75.00    Types: Cigarettes    Last attempt to quit: 09/10/1999    Years since quitting: 18.5  . Smokeless tobacco: Never Used  Substance Use Topics  . Alcohol use: No    Alcohol/week: 0.0 oz  . Drug use: No     Colonoscopy:  PAP:  Bone density:  Lipid panel:  Allergies  Allergen Reactions  . Cephalexin Rash    Current Outpatient Medications  Medication Sig Dispense Refill  . albuterol (PROVENTIL HFA;VENTOLIN HFA) 108 (90 Base) MCG/ACT inhaler Inhale 1-2 puffs into the lungs every 4 (four) hours as needed for wheezing or shortness of breath. 1 Inhaler 10  . amLODipine (NORVASC) 5 MG tablet Take 1 tablet (5 mg total) by mouth 2 (two) times daily. 180 tablet 1  . aspirin EC 81 MG tablet Take by mouth.    Marland Kitchen atorvastatin (LIPITOR) 10 MG tablet Take 1 tablet (10 mg total) by mouth at bedtime. 90 tablet 3  . cholecalciferol (VITAMIN D) 1000 UNITS tablet Take 1,000 Units by mouth daily.    Marland Kitchen  dutasteride (AVODART) 0.5 MG capsule Take 0.5 mg by mouth 2 (two) times a week.    Marland Kitchen ketoconazole (NIZORAL) 2 % cream Apply 1 application topically daily. To 2-3 skin lesions 15 g 0  . ketoconazole (NIZORAL) 2 % cream     . levofloxacin (LEVAQUIN) 750 MG tablet Take 1 tablet (750 mg total) by mouth every other day. 5 tablet 0  . Multiple Vitamins-Minerals (MEGA MULTIVITAMIN FOR MEN) TABS Take 2 capsules by mouth daily.    Marland Kitchen omeprazole (PRILOSEC) 20 MG capsule Take 1 capsule (20 mg total) by mouth daily. 30 capsule 6  . sertraline (ZOLOFT) 50 MG  tablet Take 1 tablet (50 mg total) by mouth daily. 30 tablet 3  . Tamsulosin HCl (FLOMAX) 0.4 MG CAPS Take 0.4 mg by mouth daily.    Marland Kitchen tiotropium (SPIRIVA) 18 MCG inhalation capsule Place 18 mcg into inhaler and inhale daily.    Marland Kitchen triamcinolone cream (KENALOG) 0.1 %     . valACYclovir (VALTREX) 500 MG tablet Take 1 tablet (500 mg total) by mouth every other day. 15 tablet 3   No current facility-administered medications for this visit.     OBJECTIVE: There were no vitals filed for this visit.   There is no height or weight on file to calculate BMI.    ECOG FS:0 - Asymptomatic  General: Well-developed, well-nourished, no acute distress. Eyes: Pink conjunctiva, anicteric sclera. HEENT: Normocephalic, moist mucous membranes, clear oropharnyx. Lungs: Clear to auscultation bilaterally. Heart: Regular rate and rhythm. No rubs, murmurs, or gallops. Abdomen: Soft, nontender, nondistended. No organomegaly noted, normoactive bowel sounds. Musculoskeletal: No edema, cyanosis, or clubbing. Neuro: Alert, answering all questions appropriately. Cranial nerves grossly intact. Skin: No rashes or petechiae noted. Psych: Normal affect.  LAB RESULTS:  Lab Results  Component Value Date   NA 135 03/12/2018   K 4.1 03/12/2018   CL 101 03/12/2018   CO2 22 03/12/2018   GLUCOSE 122 (H) 03/12/2018   BUN 19 03/12/2018   CREATININE 1.63 (H) 03/12/2018   CALCIUM 9.1 03/12/2018   PROT 7.2 03/02/2018   ALBUMIN 4.3 03/02/2018   AST 24 03/02/2018   ALT 24 03/02/2018   ALKPHOS 80 03/02/2018   BILITOT 0.3 03/02/2018   GFRNONAA 46 (L) 01/04/2018   GFRAA 54 (L) 01/04/2018    Lab Results  Component Value Date   WBC 11.7 (H) 03/17/2018   NEUTROABS 6.8 (H) 03/17/2018   HGB 11.6 (L) 03/17/2018   HCT 35.1 (L) 03/17/2018   MCV 85.6 03/17/2018   PLT 295 03/17/2018     STUDIES: Ct Chest W Contrast  Result Date: 03/12/2018 CLINICAL DATA:  Patient with history of pneumonia and prior chest CT. Follow-up  evaluation. EXAM: CT CHEST WITH CONTRAST TECHNIQUE: Multidetector CT imaging of the chest was performed during intravenous contrast administration. CONTRAST:  58mL ISOVUE-370 IOPAMIDOL (ISOVUE-370) INJECTION 76% COMPARISON:  Chest CT 01/03/2018 FINDINGS: Cardiovascular: Heart is mildly enlarged. Coronary arterial vascular calcifications. Thoracic aortic vascular calcifications. Mediastinum/Nodes: Grossly unchanged extensive mediastinal, hilar and bilateral axillary adenopathy. Similar-appearing 2.3 cm subcarinal lymph node (image 72; series 2), previously 2.4 cm. Similar 1.9 cm right paratracheal lymph node (image 43; series 2), previously 2.1 cm. Similar 1.1 cm right axillary lymph node (image 29; series 2), previously 1.0 cm. Small hiatal hernia. Esophagus is normal in appearance. Lungs/Pleura: Central airways are patent. Minimal dependent atelectasis right lower lobe. Interval resolution of previously described medial right lower lobe nodular opacity most compatible with resolved infectious process. Interval increase in left  lower lobe patchy ground-glass opacities. Unchanged 4 mm left upper lobe nodule (image 28; series 3). No pleural effusion or pneumothorax. Upper Abdomen: No acute process. Musculoskeletal: Thoracic spine degenerative changes. No aggressive or acute appearing osseous lesions. IMPRESSION: 1. Persistent mediastinal and axillary lymphadenopathy which is nonspecific however concerning for the possibility of lymphoma given persistent nature, size and distribution. Recommend clinical and laboratory correlation. Consider percutaneous sampling of 1 of the lymph nodes as clinically indicated. 2. Interval resolution of previously described right lower lobe nodule compatible with resolved infectious/inflammatory process. 3. Persistent to increased left lower lobe ground-glass and patchy consolidative opacities which may represent worsening infectious/inflammatory process. Recommend short-term follow-up  chest CT (2-3 months) to assess for interval change. 4. Stable 4 mm left upper lobe nodule. Recommend attention on follow-up. 5. Aortic Atherosclerosis (ICD10-I70.0). Electronically Signed   By: Lovey Newcomer M.D.   On: 03/12/2018 17:50   Mr Brain Wo Contrast  Result Date: 03/12/2018 CLINICAL DATA:  84 y/o M; sudden onset confusion and loss of ability to walk today. History of multiple falls. EXAM: MRI HEAD WITHOUT CONTRAST TECHNIQUE: Multiplanar, multiecho pulse sequences of the brain and surrounding structures were obtained without intravenous contrast. COMPARISON:  None. FINDINGS: Brain: No acute infarction, hemorrhage, hydrocephalus, extra-axial collection or mass lesion. Small chronic lacunar infarcts are present in right frontal periventricular white matter in the caudate body. Mild chronic microvascular ischemic changes and parenchymal volume loss of the brain for age. Vascular: Normal flow voids. Skull and upper cervical spine: Normal marrow signal. Sinuses/Orbits: Mild diffuse paranasal sinus disease. Trace opacification of the tips of mastoid air cells. Bilateral intra-ocular lens replacement. Other: None. IMPRESSION: 1. No acute intracranial abnormality identified. 2. Small chronic lacunar infarcts in right frontal periventricular white matter and right caudate body. 3. Mild for age chronic microvascular ischemic changes and parenchymal volume loss of the brain. 4. Mild paranasal sinus disease. Electronically Signed   By: Kristine Garbe M.D.   On: 03/12/2018 19:35   US Venous Img Lower Bilateral  Result Date: 03/12/2018 CLINICAL DATA:  84 year old male with a history of leg edema EXAM: BILATERAL LOWER EXTREMITY VENOUS DOPPLER ULTRASOUND TECHNIQUE: Gray-scale sonography with graded compression, as well as color Doppler and duplex ultrasound were performed to evaluate the lower extremity deep venous systems from the level of the common femoral vein and including the common femoral, femoral,  profunda femoral, popliteal and calf veins including the posterior tibial, peroneal and gastrocnemius veins when visible. The superficial great saphenous vein was also interrogated. Spectral Doppler was utilized to evaluate flow at rest and with distal augmentation maneuvers in the common femoral, femoral and popliteal veins. COMPARISON:  None. FINDINGS: RIGHT LOWER EXTREMITY Common Femoral Vein: No evidence of thrombus. Normal compressibility, respiratory phasicity and response to augmentation. Saphenofemoral Junction: No evidence of thrombus. Normal compressibility and flow on color Doppler imaging. Profunda Femoral Vein: No evidence of thrombus. Normal compressibility and flow on color Doppler imaging. Femoral Vein: No evidence of thrombus. Normal compressibility, respiratory phasicity and response to augmentation. Popliteal Vein: No evidence of thrombus. Normal compressibility, respiratory phasicity and response to augmentation. Calf Veins: No evidence of thrombus. Normal compressibility and flow on color Doppler imaging. Superficial Great Saphenous Vein: No evidence of thrombus. Normal compressibility and flow on color Doppler imaging. Other Findings:  None. LEFT LOWER EXTREMITY Common Femoral Vein: No evidence of thrombus. Normal compressibility, respiratory phasicity and response to augmentation. Saphenofemoral Junction: No evidence of thrombus. Normal compressibility and flow on color Doppler imaging. Profunda Femoral  Vein: No evidence of thrombus. Normal compressibility and flow on color Doppler imaging. Femoral Vein: No evidence of thrombus. Normal compressibility, respiratory phasicity and response to augmentation. Popliteal Vein: No evidence of thrombus. Normal compressibility, respiratory phasicity and response to augmentation. Calf Veins: No evidence of thrombus. Normal compressibility and flow on color Doppler imaging. Superficial Great Saphenous Vein: No evidence of thrombus. Normal compressibility  and flow on color Doppler imaging. Other Findings:  None. IMPRESSION: Sonographic survey of the bilateral lower extremities negative for DVT Electronically Signed   By: Corrie Mckusick D.O.   On: 03/12/2018 17:06    ASSESSMENT: Lymphadenopathy, leukocytosis.  PLAN:   1. Lymphadenopathy, leukocytosis: CT scan results reviewed independently and report as above with suspicious lymphadenopathy in the patient's mediastinum, hilar, and bilateral axilla.  Given the results of his flow cytometry panel which include a CD5+ and CD23+ clonal B-cell population, this is highly suspicious for CLL.  Will get a PET scan for further evaluation.  Return to clinic in 1 to 2 weeks after his PET scan to discuss the results and treatment planning if necessary.  Patient will possibly require biopsy which can be determined once the results of the PET scan are finalized.  Approximately 60 minutes spent in discussion of which greater than 50% was consultation.  Patient expressed understanding and was in agreement with this plan. He also understands that He can call clinic at any time with any questions, concerns, or complaints.   Cancer Staging No matching staging information was found for the patient.  Donald Huger, MD   03/29/2018 8:20 AM

## 2018-03-30 ENCOUNTER — Ambulatory Visit: Payer: Medicare HMO | Admitting: Cardiovascular Disease

## 2018-04-01 DIAGNOSIS — I6523 Occlusion and stenosis of bilateral carotid arteries: Secondary | ICD-10-CM | POA: Insufficient documentation

## 2018-04-02 ENCOUNTER — Inpatient Hospital Stay: Payer: Medicare HMO | Admitting: Oncology

## 2018-04-03 ENCOUNTER — Other Ambulatory Visit: Payer: Self-pay | Admitting: *Deleted

## 2018-04-07 ENCOUNTER — Telehealth: Payer: Self-pay | Admitting: Oncology

## 2018-04-07 NOTE — Telephone Encounter (Signed)
Spoke with patient about r\s PEt scan and follow-up appt with Dr. Grayland Ormond. Pt is aware of location, date and time for scan and follow-up w\ [facility].

## 2018-04-09 ENCOUNTER — Ambulatory Visit: Payer: Medicare HMO | Admitting: Pulmonary Disease

## 2018-04-09 ENCOUNTER — Encounter
Admission: RE | Admit: 2018-04-09 | Discharge: 2018-04-09 | Disposition: A | Discharge status: Home / Self Care | Payer: Medicare HMO | Source: Ambulatory Visit | Attending: Oncology | Admitting: Oncology

## 2018-04-09 ENCOUNTER — Encounter: Payer: Self-pay | Admitting: Pulmonary Disease

## 2018-04-09 VITALS — BP 132/68 | HR 55 | Ht 68.0 in | Wt 214.0 lb

## 2018-04-09 DIAGNOSIS — R918 Other nonspecific abnormal finding of lung field: Secondary | ICD-10-CM

## 2018-04-09 DIAGNOSIS — R591 Generalized enlarged lymph nodes: Secondary | ICD-10-CM

## 2018-04-09 DIAGNOSIS — Z87891 Personal history of nicotine dependence: Secondary | ICD-10-CM

## 2018-04-09 DIAGNOSIS — R590 Localized enlarged lymph nodes: Secondary | ICD-10-CM

## 2018-04-09 DIAGNOSIS — J449 Chronic obstructive pulmonary disease, unspecified: Secondary | ICD-10-CM

## 2018-04-09 MED ORDER — FLUTICASONE FUROATE-VILANTEROL 100-25 MCG/INH IN AEPB: 1.0000 | INHALATION_SPRAY | Freq: Every day | RESPIRATORY_TRACT | 5 refills | Status: DC

## 2018-04-09 NOTE — Patient Instructions (Signed)
Discontinue Spiriva inhaler Breo inhaler, 1 inhalation daily.  Rinse mouth after use.  Sample provided and prescription entered I will follow-up after PET scan and discuss with Dr. Grayland Ormond We discussed the possibility of bronchoscopy for biopsy of the lymph nodes Follow-up scheduled in 3 months

## 2018-04-10 ENCOUNTER — Encounter
Admission: RE | Admit: 2018-04-10 | Discharge: 2018-04-10 | Disposition: A | Discharge status: Home / Self Care | Payer: Medicare HMO | Source: Ambulatory Visit | Attending: Oncology | Admitting: Oncology

## 2018-04-10 DIAGNOSIS — R591 Generalized enlarged lymph nodes: Secondary | ICD-10-CM | POA: Insufficient documentation

## 2018-04-10 LAB — GLUCOSE, CAPILLARY: Glucose-Capillary: 106 mg/dL — ABNORMAL HIGH (ref 65–99)

## 2018-04-10 MED ORDER — FLUDEOXYGLUCOSE F - 18 (FDG) INJECTION
11.7600 | Freq: Once | INTRAVENOUS | Status: AC | PRN
  Administered 2018-04-10: 11.76 via INTRAVENOUS

## 2018-04-11 NOTE — Progress Notes (Signed)
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   INTERVAL HISTORY: Patient returns to clinic today to discuss his laboratory work as well as PET scan results.  He continues to feel well and remains asymptomatic.  He denies any recent fevers or illnesses.  He has a good appetite and denies weight loss.  He denies any night sweats.  He has no chest pain, cough, hemoptysis, or shortness of breath.  He denies any nausea, vomiting, constipation, or diarrhea.  He has no urinary complaints.  Patient offers no specific complaints today.  REVIEW OF SYSTEMS:   Review of Systems  Constitutional: Negative.  Negative for diaphoresis ( ), fever, malaise/fatigue and weight loss.  Respiratory: Negative.  Negative for cough, hemoptysis and shortness of breath.   Cardiovascular: Negative.  Negative for chest pain and leg swelling.  Gastrointestinal: Negative.  Negative for abdominal pain.  Genitourinary: Negative.  Negative for dysuria.  Musculoskeletal: Negative.  Negative for back pain.  Skin: Negative.  Negative for rash.  Neurological: Negative.  Negative for sensory change, focal weakness and weakness.  Endo/Heme/Allergies: Does not bruise/bleed easily.  Psychiatric/Behavioral: Negative.  The patient is not nervous/anxious.     As per HPI. Otherwise, a complete review of systems is negative.  PAST MEDICAL HISTORY: Past Medical History:  Diagnosis Date  . Anemia   . Arthritis   . Esophageal reflux   . Hyperlipidemia   . Hypertension   . Pneumonia    recurrent  . Sebaceous cyst     PAST SURGICAL HISTORY: Past Surgical History:  Procedure Laterality Date  . HEMORRHOID SURGERY      FAMILY  HISTORY: Family History  Problem Relation Age of Onset  . Heart disease Mother   . Cancer Maternal Aunt        lung  . Lung cancer Maternal Aunt   . Leukemia Maternal Aunt     ADVANCED DIRECTIVES (Y/N):  N  HEALTH MAINTENANCE: Social History   Tobacco Use  . Smoking status: Former Smoker    Packs/day: 1.50    Years: 50.00    Pack years: 75.00    Types: Cigarettes    Last attempt to quit: 09/10/1999    Years since quitting: 18.6  . Smokeless tobacco: Never Used  Substance Use Topics  . Alcohol use: No    Alcohol/week: 0.0 oz  . Drug use: No     Colonoscopy:  PAP:  Bone density:  Lipid panel:  Allergies  Allergen Reactions  . Cephalexin Rash    Current Outpatient Medications  Medication Sig Dispense Refill  . amLODipine (NORVASC) 5 MG tablet Take 1 tablet (5 mg total) by mouth 2 (two) times daily. 180 tablet 1  . aspirin EC 81 MG tablet Take by mouth.    Marland Kitchen atorvastatin (LIPITOR) 10 MG tablet Take 1 tablet (10 mg total) by mouth at bedtime. 90 tablet 3  . cholecalciferol (VITAMIN D) 1000 UNITS tablet Take 1,000 Units by mouth daily.    Marland Kitchen dutasteride (AVODART) 0.5 MG capsule Take 0.5 mg by mouth 2 (two) times a week.    . fluticasone furoate-vilanterol (BREO ELLIPTA) 100-25 MCG/INH AEPB Inhale 1 puff into the lungs daily. 60 each 5  . ketoconazole (NIZORAL) 2 % cream Apply 1 application  topically daily. To 2-3 skin lesions 15 g 0  . ketoconazole (NIZORAL) 2 % cream     . Multiple Vitamins-Minerals (MEGA MULTIVITAMIN FOR MEN) TABS Take 2 capsules by mouth daily.    Marland Kitchen omeprazole (PRILOSEC) 20 MG capsule Take 1 capsule (20 mg total) by mouth daily. 30 capsule 6  . sertraline (ZOLOFT) 50 MG tablet Take 1 tablet (50 mg total) by mouth daily. 30 tablet 3  . Tamsulosin HCl (FLOMAX) 0.4 MG CAPS Take 0.4 mg by mouth daily.    Marland Kitchen triamcinolone cream (KENALOG) 0.1 %     . valACYclovir (VALTREX) 500 MG tablet Take 1 tablet (500 mg total) by mouth every other day. 15 tablet 3  .  albuterol (PROVENTIL HFA;VENTOLIN HFA) 108 (90 Base) MCG/ACT inhaler Inhale 1-2 puffs into the lungs every 4 (four) hours as needed for wheezing or shortness of breath. (Patient not taking: Reported on 04/13/2018) 1 Inhaler 10   No current facility-administered medications for this visit.     OBJECTIVE: Vitals:   04/13/18 0942  BP: (!) 154/64  Pulse: (!) 59  Resp: 18  Temp: (!) 96.3 F (35.7 C)     Body mass index is 32.77 kg/m.    ECOG FS:0 - Asymptomatic  General: Well-developed, well-nourished, no acute distress. Eyes: Pink conjunctiva, anicteric sclera. HEENT: Normocephalic, moist mucous membranes, clear oropharnyx. Lungs: Clear to auscultation bilaterally. Heart: Regular rate and rhythm. No rubs, murmurs, or gallops. Abdomen: Soft, nontender, nondistended. No organomegaly noted, normoactive bowel sounds. Musculoskeletal: No edema, cyanosis, or clubbing. Neuro: Alert, answering all questions appropriately. Cranial nerves grossly intact. Skin: No rashes or petechiae noted. Psych: Normal affect. Lymphatics: No cervical, calvicular, axillary or inguinal LAD.  LAB RESULTS:  Lab Results  Component Value Date   NA 135 03/12/2018   K 4.1 03/12/2018   CL 101 03/12/2018   CO2 22 03/12/2018   GLUCOSE 122 (H) 03/12/2018   BUN 19 03/12/2018   CREATININE 1.63 (H) 03/12/2018   CALCIUM 9.1 03/12/2018   PROT 7.2 03/02/2018   ALBUMIN 4.3 03/02/2018   AST 24 03/02/2018   ALT 24 03/02/2018   ALKPHOS 80 03/02/2018   BILITOT 0.3 03/02/2018   GFRNONAA 46 (L) 01/04/2018   GFRAA 54 (L) 01/04/2018    Lab Results  Component Value Date   WBC 11.8 (H) 04/13/2018   NEUTROABS 5.5 04/13/2018   HGB 12.0 (L) 04/13/2018   HCT 35.7 (L) 04/13/2018   MCV 85.4 04/13/2018   PLT 193 04/13/2018     STUDIES: Nm Pet Image Initial (pi) Skull Base To Thigh  Result Date: 04/10/2018 CLINICAL DATA:  Initial treatment strategy for leukocytosis and persistent bilateral axillary and mediastinal  adenopathy recent chest CT studies. EXAM: NUCLEAR MEDICINE PET SKULL BASE TO THIGH TECHNIQUE: 11.8 mCi F-18 FDG was injected intravenously. Full-ring PET imaging was performed from the skull base to thigh after the radiotracer. CT data was obtained and used for attenuation correction and anatomic localization. Fasting blood glucose: 106 mg/dl COMPARISON:  03/12/2018 chest CT. FINDINGS: Mediastinal blood pool activity: SUV max 2.0 NECK: No hypermetabolic lymph nodes in the neck. Incidental CT findings: none CHEST: Symmetric mildly enlarged non hypermetabolic axillary lymph nodes, which and stable in size since 03/12/2018 chest CT. Enlarged mildly hypermetabolic 1.9 cm right paratracheal node with max SUV 3.3 (series 3/image 85), stable in size. Enlarged mildly hypermetabolic 2.1 cm right subcarinal node with max SUV 3.4 (series 3/image 101), stable in size. No discrete hypermetabolic hilar nodes. No hypermetabolic pulmonary findings.  Incidental CT findings: Mild cardiomegaly. Coronary atherosclerosis. Atherosclerotic nonaneurysmal thoracic aorta. Previously described patchy left greater than right bilateral lower lobe opacities appear resolved. No acute consolidative airspace disease or lung masses. Left upper lobe solid 4 mm pulmonary nodule, below PET resolution, stable since 01/03/2018 chest CT. No new significant pulmonary nodules. ABDOMEN/PELVIS: Mildly enlarged and mildly hypermetabolic 1.1 cm central mesenteric node with max SUV 2.7 (series 3/image 174). Nonenlarged mildly hypermetabolic 0.8 cm left inguinal node with max SUV 2.5 (series 3/image 253). Mildly enlarged mildly hypermetabolic right external iliac 1.0 cm node with max SUV 2.2 (series 3/image 242). No abnormal hypermetabolic activity within the liver, pancreas, adrenal glands, or spleen. Incidental CT findings: Small hiatal hernia. Diffuse hepatic steatosis. Marked sigmoid diverticulosis. Atherosclerotic nonaneurysmal abdominal aorta. SKELETON: No  focal hypermetabolic activity to suggest skeletal metastasis. Incidental CT findings: none IMPRESSION: 1. Mildly hypermetabolic right paratracheal, subcarinal, central mesenteric, right external iliac and left inguinal lymphadenopathy, suggestive of a lymphoproliferative disorder. The most FDG avid nodes are in the mediastinum. 2. Left upper lobe 4 mm solid pulmonary nodule, below PET resolution, stable since 01/03/2018 chest CT, suggesting a benign nodule. Suggest attention on follow-up chest CT in 6 months. 3. Chronic findings include: Aortic Atherosclerosis (ICD10-I70.0). Coronary atherosclerosis. Mild cardiomegaly. Small hiatal hernia. Diffuse hepatic steatosis. Marked sigmoid diverticulosis. Electronically Signed   By: Ilona Sorrel M.D.   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  This and given the results of his flow cytometry panel which include a CD5+ and CD23+ clonal B-cell population, this is highly suspicious for CLL/SLL.  Patient does not require biopsy at this point, but will consider one in the future if he has enlarging lymph nodes or become symptomatic.  No intervention is needed.  Patient does not require bone marrow biopsy.  Return to clinic in 3 months with repeat imaging to assess for interval change and follow-up 1 to 2 days later to discuss the results.  Approximately 30 minutes was spent in discussion of which greater than 50% was consultation.     Patient expressed understanding and was in agreement with this plan. He also understands that He can call clinic at any time with any questions, concerns, or complaints.   Cancer Staging No matching staging information was found for the patient.  Lloyd Huger, MD   04/15/2018 5:39 PM

## 2018-04-12 ENCOUNTER — Encounter: Payer: Self-pay | Admitting: Pulmonary Disease

## 2018-04-12 NOTE — Progress Notes (Signed)
PULMONARY OFFICE FOLLOW UP NOTE  Requesting MD/Service: self referred Date of initial consultation: 11/27/15 Reason for consultation: dyspnea, cough, former smoker, mild COPD  PT PROFILE: 84 y.o. M smoker of > 50 PYs previously seen by Dr Lake Bells and diagnosed with COPD. Self referred for eval of DOE and occasional cough productive of scant discolored mucus. PFTs 2014 revealed very mild obstruction  PROBLEMS: COPD - mild by PFTs 2014 Former smoker Class I dyspnea  DATA: 01/03/18 CT chest: Left greater than right dependent basilar opacities may indicate pneumonia and/or aspiration pneumonitis. Multiple enlarged mediastinal lymph nodes, measuring up to 2.4 cm, of uncertain etiology. 4 mm LUL nodule and medial right basilar pulmonary nodule measures 1.3 cm 03/12/18 CT chest: Persistent mediastinal and axillary lymphadenopathy which is nonspecific however concerning for the possibility of lymphoma given persistent nature, size and distribution. Recommend clinical and laboratory correlation. Interval resolution of previously described right lower lobe nodule. Persistent to increased left lower lobe ground-glass and patchy consolidative opacities which may represent worsening infectious/inflammatory process PET 04/09/18: Mildly hypermetabolic right paratracheal, subcarinal, central mesenteric, right external iliac and left inguinal lymphadenopathy, suggestive of a lymphoproliferative disorder. The most FDG avid nodes are in the mediastinum (in particular, subcarinal).  INTERVAL HISTORY: Seen by Heme-Onc Grayland Ormond) for leukocytosis and adenopathy on CT chest  SUBJ: No new pulmonary complaints. Biggest concern is recent finding of leukocytosis and LAN on CT chest. Denies CP, fever, purulent sputum, hemoptysis, LE edema and calf tenderness. Wants to try a different inhaler.  OBJ: Vitals:   04/09/18 0856 04/09/18 0906  BP:  132/68  Pulse:  (!) 55  SpO2:  92%  Weight: 214 lb (97.1 kg)   Height:  5\' 8"  (1.727 m)    EXAM:  Gen: NAD HEENT: NCAT, sclera white Neck: No JVD Lungs: breath sounds Mildly diminished, no wheezes or other adventitious sounds Cardiovascular: RRR, no murmurs Abdomen: Soft, nontender, normal BS Ext: without clubbing, cyanosis, edema Neuro: grossly intact Skin: Limited exam, no lesions noted   DATA: BMP Latest Ref Rng & Units 03/12/2018 03/02/2018 01/04/2018  Glucose 70 - 99 mg/dL 122(H) 99 109(H)  BUN 6 - 23 mg/dL 19 22 30(H)  Creatinine 0.40 - 1.50 mg/dL 1.63(H) 1.30 1.36(H)  Sodium 135 - 145 mEq/L 135 137 137  Potassium 3.5 - 5.1 mEq/L 4.1 4.8 3.6  Chloride 96 - 112 mEq/L 101 102 107  CO2 19 - 32 mEq/L 22 26 20(L)  Calcium 8.4 - 10.5 mg/dL 9.1 9.7 8.7(L)    CBC Latest Ref Rng & Units 03/17/2018 03/12/2018 01/04/2018  WBC 3.8 - 10.6 K/uL 11.7(H) 20.3 Repeated and verified X2.(Planada) 17.0(H)  Hemoglobin 13.0 - 18.0 g/dL 11.6(L) 12.0(L) 11.8(L)  Hematocrit 40.0 - 52.0 % 35.1(L) 36.2(L) 35.6(L)  Platelets 150 - 440 K/uL 295 227.0 203    CXR: NNF  IMPRESSION:   ICD-10-CM   1. Former smoker Z87.891   2. COPD, mild ([place]) J44.9   3. LLL infiltrate R91.8   4. Mediastinal lymphadenopathy R59.0       PLAN: Discontinue Spiriva inhaler Breo inhaler, 1 inhalation daily.  Rinse mouth after use.  Sample provided and prescription entered I will follow-up after PET scan and discuss with Dr. Grayland Ormond We discussed the possibility of bronchoscopy for biopsy of the lymph nodes (Likely EBUS for TBBNA of subcarinal nodes +/- washings, brushings LLL)   Follow-up scheduled in 3 months  Merton Border, MD PCCM service Mobile [phone] Pager [phone] 04/12/2018 8:37 PM

## 2018-04-13 ENCOUNTER — Other Ambulatory Visit: Payer: Self-pay

## 2018-04-13 ENCOUNTER — Inpatient Hospital Stay: Payer: Medicare HMO

## 2018-04-13 ENCOUNTER — Inpatient Hospital Stay: Payer: Medicare HMO | Attending: Oncology | Admitting: Oncology

## 2018-04-13 VITALS — BP 154/64 | HR 59 | Temp 96.3°F | Resp 18 | Wt 215.5 lb

## 2018-04-13 DIAGNOSIS — C8590 Non-Hodgkin lymphoma, unspecified, unspecified site: Secondary | ICD-10-CM | POA: Diagnosis present

## 2018-04-13 DIAGNOSIS — D72829 Elevated white blood cell count, unspecified: Secondary | ICD-10-CM | POA: Diagnosis not present

## 2018-04-13 DIAGNOSIS — R591 Generalized enlarged lymph nodes: Secondary | ICD-10-CM | POA: Diagnosis not present

## 2018-04-13 DIAGNOSIS — C9190 Lymphoid leukemia, unspecified not having achieved remission: Secondary | ICD-10-CM | POA: Diagnosis not present

## 2018-04-13 DIAGNOSIS — C9110 Chronic lymphocytic leukemia of B-cell type not having achieved remission: Secondary | ICD-10-CM

## 2018-04-13 LAB — CBC WITH DIFFERENTIAL/PLATELET
BASOS PCT: 1 %
Basophils Absolute: 0.2 10*3/uL — ABNORMAL HIGH (ref 0–0.1)
EOS ABS: 1.1 10*3/uL — AB (ref 0–0.7)
Eosinophils Relative: 10 %
HCT: 35.7 % — ABNORMAL LOW (ref 40.0–52.0)
HEMOGLOBIN: 12 g/dL — AB (ref 13.0–18.0)
Lymphocytes Relative: 33 %
Lymphs Abs: 3.9 10*3/uL — ABNORMAL HIGH (ref 1.0–3.6)
MCH: 28.7 pg (ref 26.0–34.0)
MCHC: 33.6 g/dL (ref 32.0–36.0)
MCV: 85.4 fL (ref 80.0–100.0)
MONOS PCT: 9 %
Monocytes Absolute: 1.1 10*3/uL — ABNORMAL HIGH (ref 0.2–1.0)
NEUTROS PCT: 47 %
Neutro Abs: 5.5 10*3/uL (ref 1.4–6.5)
Platelets: 193 10*3/uL (ref 150–440)
RBC: 4.18 MIL/uL — ABNORMAL LOW (ref 4.40–5.90)
RDW: 14.8 % — ABNORMAL HIGH (ref 11.5–14.5)
WBC: 11.8 10*3/uL — AB (ref 3.8–10.6)

## 2018-04-13 NOTE — Progress Notes (Signed)
Here for follow up. Stated " Im feeling just fine"

## 2018-06-01 ENCOUNTER — Ambulatory Visit (INDEPENDENT_AMBULATORY_CARE_PROVIDER_SITE_OTHER): Payer: Medicare HMO | Admitting: Family Medicine

## 2018-06-01 ENCOUNTER — Encounter: Payer: Self-pay | Admitting: Family Medicine

## 2018-06-01 DIAGNOSIS — I10 Essential (primary) hypertension: Secondary | ICD-10-CM

## 2018-06-01 DIAGNOSIS — R591 Generalized enlarged lymph nodes: Secondary | ICD-10-CM | POA: Diagnosis not present

## 2018-06-01 DIAGNOSIS — E119 Type 2 diabetes mellitus without complications: Secondary | ICD-10-CM

## 2018-06-01 DIAGNOSIS — R0609 Other forms of dyspnea: Secondary | ICD-10-CM | POA: Diagnosis not present

## 2018-06-01 DIAGNOSIS — R0600 Dyspnea, unspecified: Secondary | ICD-10-CM

## 2018-06-01 NOTE — Assessment & Plan Note (Addendum)
Recent stress testing normal.  No chest pain.  Potentially could be related to his COPD or deconditioning.  He does use his inhalers daily.  He is unsure if they are beneficial.  He will see cardiology as planned.  He will see pulmonology as planned.

## 2018-06-01 NOTE — Assessment & Plan Note (Signed)
Likely related to CLL based on oncology note.  He will follow-up with them as planned in September.

## 2018-06-01 NOTE — Progress Notes (Signed)
  Tommi Rumps, MD Phone: [phone]  Donald [name] is a 84 y.o. male who presents today for f/u.  CC: Diabetes, hypertension, CLL  Diabetes: Does not check sugars.  Notes he was not aware that he had diabetes.  No polyuria or polydipsia.  He typically eats cereal for breakfast.  Salami and cheese sandwich or salami and cheese salad for lunch.  Vegetables and kielbasa for dinner.  No soda or sweet tea.  Peanuts and apples for snacks.  Yogurt or chocolate pudding for dessert.  He is not able to exercise related to chronic back pain.  HYPERTENSION  Disease Monitoring  Home BP Monitoring not checking Chest pain- no    Dyspnea- yes, exertional, for 3-4 months, has been evaluated by cardiology and had a stress echo that was normal Medications  Compliance-  Taking amlodipine.  Edema- previously had minimal in left ankle and not proximal to the ankle, has not occurred in a couple of weeks No PND or orthopnea.  He does see his cardiologist on Thursday.  CLL: Diagnosed through hematology.  He has had no fevers, weight loss, appetite changes, or night sweats.  He denies depression.  Social History   Tobacco Use  Smoking Status Former Smoker  . Packs/day: 1.50  . Years: 50.00  . Pack years: 75.00  . Types: Cigarettes  . Last attempt to quit: 09/10/1999  . Years since quitting: 18.7  Smokeless Tobacco Never Used     ROS see history of present illness  Objective  Physical Exam Vitals:   06/01/18 0920  BP: (!) 148/60  Pulse: 63  Temp: 98.4 F (36.9 C)  SpO2: 95%    BP Readings from Last 3 Encounters:  06/01/18 (!) 148/60  04/13/18 (!) 154/64  04/09/18 132/68   Wt Readings from Last 3 Encounters:  06/01/18 224 lb (101.6 kg)  04/13/18 215 lb 8 oz (97.8 kg)  04/09/18 214 lb (97.1 kg)    Physical Exam  Constitutional: No distress.  Cardiovascular: Normal rate, regular rhythm and normal heart sounds.  Pulmonary/Chest: Effort normal and breath sounds normal.    Musculoskeletal: He exhibits no edema.  Neurological: He is alert.  Skin: Skin is warm and dry. He is not diaphoretic.     Assessment/Plan: Please see individual problem list.  Essential hypertension Diastolic blood pressure limits further titration of antihypertensives.  He will continue amlodipine.  He will see cardiology as planned later this week.  Diabetes mellitus without complication (HCC) Encourage diet and exercise.  Lymphadenopathy Likely related to CLL based on oncology note.  He will follow-up with them as planned in September.  Dyspnea Recent stress testing normal.  No chest pain.  Potentially could be related to his COPD or deconditioning.  He does use his inhalers daily.  He is unsure if they are beneficial.  He will see cardiology as planned.  He will see pulmonology as planned.  Discussed physical therapy for his back though he was unable to afford this.  He did it through the [place] previously and he will check with them on exercises.  No orders of the defined types were placed in this encounter.   No orders of the defined types were placed in this encounter.    Tommi Rumps, MD [place]

## 2018-06-01 NOTE — Patient Instructions (Signed)
Nice to see you. Please work on dietary changes as outlined below. Please see cardiology as planned.  Diet Recommendations  Starchy (carb) foods: Bread, rice, pasta, potatoes, corn, cereal, grits, crackers, bagels, muffins, all baked goods.  (Fruits, milk, and yogurt also have carbohydrate, but most of these foods will not spike your blood sugar as the starchy foods will.)  A few fruits do cause high blood sugars; use small portions of bananas (limit to 1/2 at a time), grapes, watermelon, oranges, and most tropical fruits.    Protein foods: Meat, fish, poultry, eggs, dairy foods, and beans such as pinto and kidney beans (beans also provide carbohydrate).   1. Eat at least 3 meals and 1-2 snacks per day. Never go more than 4-5 hours while awake without eating. Eat breakfast within the first hour of getting up.   2. Limit starchy foods to TWO per meal and ONE per snack. ONE portion of a starchy  food is equal to the following:   - ONE slice of bread (or its equivalent, such as half of a hamburger bun).   - 1/2 cup of a "scoopable" starchy food such as potatoes or rice.   - 15 grams of carbohydrate as shown on food label.  3. Include at every meal: a protein food, a carb food, and vegetables and/or fruit.   - Obtain twice the volume of veg's as protein or carbohydrate foods for both lunch and dinner.   - Fresh or frozen veg's are best.   - Keep frozen veg's on hand for a quick vegetable serving.

## 2018-06-01 NOTE — Assessment & Plan Note (Signed)
Encourage diet and exercise.

## 2018-06-01 NOTE — Assessment & Plan Note (Signed)
Diastolic blood pressure limits further titration of antihypertensives.  He will continue amlodipine.  He will see cardiology as planned later this week.

## 2018-06-05 ENCOUNTER — Telehealth: Payer: Self-pay

## 2018-06-05 NOTE — Telephone Encounter (Signed)
Please advise 

## 2018-06-05 NOTE — Telephone Encounter (Signed)
Cup placed up front.

## 2018-06-05 NOTE — Telephone Encounter (Signed)
Copied from [place] [phone]. Topic: Inquiry >> Jun 05, 2018 12:13 PM Donald [name] wrote: Reason for CRM: Patient's wife called and said that he left his coffee cup in the doctors room today .They will be by on Monday to pick it up

## 2018-07-10 ENCOUNTER — Encounter: Payer: Self-pay | Admitting: Pulmonary Disease

## 2018-07-10 ENCOUNTER — Ambulatory Visit (INDEPENDENT_AMBULATORY_CARE_PROVIDER_SITE_OTHER): Payer: Medicare HMO | Admitting: Pulmonary Disease

## 2018-07-10 VITALS — BP 168/72 | HR 65 | Ht 68.0 in | Wt 222.0 lb

## 2018-07-10 DIAGNOSIS — R0609 Other forms of dyspnea: Secondary | ICD-10-CM | POA: Diagnosis not present

## 2018-07-10 DIAGNOSIS — R0600 Dyspnea, unspecified: Secondary | ICD-10-CM

## 2018-07-10 DIAGNOSIS — J449 Chronic obstructive pulmonary disease, unspecified: Secondary | ICD-10-CM

## 2018-07-10 DIAGNOSIS — R591 Generalized enlarged lymph nodes: Secondary | ICD-10-CM

## 2018-07-10 NOTE — Progress Notes (Signed)
PULMONARY OFFICE FOLLOW UP NOTE  Requesting MD/Service: self referred Date of initial consultation: 11/27/15 Reason for consultation: dyspnea, cough, former smoker, mild COPD  PT PROFILE: 84 y.o. M smoker of > 50 PYs previously seen by Dr Lake Bells and diagnosed with COPD. Self referred for eval of DOE and occasional cough productive of scant discolored mucus. PFTs 2014 revealed very mild obstruction  PROBLEMS: COPD - mild by PFTs 2014 Former smoker Class I dyspnea  DATA: 01/03/18 CT chest: Left greater than right dependent basilar opacities may indicate pneumonia and/or aspiration pneumonitis. Multiple enlarged mediastinal lymph nodes, measuring up to 2.4 cm, of uncertain etiology. 4 mm LUL nodule and medial right basilar pulmonary nodule measures 1.3 cm 03/12/18 CT chest: Persistent mediastinal and axillary lymphadenopathy which is nonspecific however concerning for the possibility of lymphoma given persistent nature, size and distribution. Recommend clinical and laboratory correlation. Interval resolution of previously described right lower lobe nodule. Persistent to increased left lower lobe ground-glass and patchy consolidative opacities which may represent worsening infectious/inflammatory process PET 04/09/18: Mildly hypermetabolic right paratracheal, subcarinal, central mesenteric, right external iliac and left inguinal lymphadenopathy, suggestive of a lymphoproliferative disorder. The most FDG avid nodes are in the mediastinum (in particular, subcarinal).  INTERVAL HISTORY: Presumptive diagnosis of CLL made by Dr. Alfonso Ramus: This is a scheduled return office visit.  He has no new pulmonary complaints.  He continues to have moderate exertional dyspnea without significant day-to-day variation.  He continues to have mild nonproductive cough.  He remains on Spiriva.  He rarely uses his albuterol rescue inhaler.  He. Denies CP, fever, purulent sputum, hemoptysis, LE edema and calf  tenderness.  Recent presumptive diagnosis of CLL noted.  Dr. Grayland Ormond does not believe that EBUS is necessary.  OBJ: Vitals:   07/10/18 0831 07/10/18 0836  BP:  (!) 168/72  Pulse:  65  SpO2:  96%  Weight: 222 lb (100.7 kg)   Height: 5\' 8"  (1.727 m)    EXAM:  Gen: No overt distress HEENT: NCAT, sclerae white Neck: No JVD noted Lungs: Breath sounds mildly diminished without wheezes or other adventitious sounds Cardiovascular: Regular, no M Abdomen: Soft, nontender, normal BS Ext: No C/C/E Neuro: No focal deficits Skin: Limited exam, no lesions noted   DATA: BMP Latest Ref Rng & Units 03/12/2018 03/02/2018 01/04/2018  Glucose 70 - 99 mg/dL 122(H) 99 109(H)  BUN 6 - 23 mg/dL 19 22 30(H)  Creatinine 0.40 - 1.50 mg/dL 1.63(H) 1.30 1.36(H)  Sodium 135 - 145 mEq/L 135 137 137  Potassium 3.5 - 5.1 mEq/L 4.1 4.8 3.6  Chloride 96 - 112 mEq/L 101 102 107  CO2 19 - 32 mEq/L 22 26 20(L)  Calcium 8.4 - 10.5 mg/dL 9.1 9.7 8.7(L)    CBC Latest Ref Rng & Units 04/13/2018 03/17/2018 03/12/2018  WBC 3.8 - 10.6 K/uL 11.8(H) 11.7(H) 20.3 Repeated and verified X2.(HH)  Hemoglobin 13.0 - 18.0 g/dL 12.0(L) 11.6(L) 12.0(L)  Hematocrit 40.0 - 52.0 % 35.7(L) 35.1(L) 36.2(L)  Platelets 150 - 440 K/uL 193 295 227.0    CXR: NNF  IMPRESSION:   ICD-10-CM   1. COPD, moderate ([place]) J44.9 Pulmonary Function Test [facility] Only  2. Exertional dyspnea out of proportion to prior PFTs R06.09 Pulmonary Function Test [facility] Only  3. Mediastinal lymphadenopathy R59.1       PLAN: Trial of Breo inhaler, one inhalation daily.  Rinse mouth after use.  Sample provided  Continue Spiriva inhaler  Continue albuterol as needed  PFTs ordered  Follow-up  in 3 months.  We will call him with results of PFTs.  He is instructed that he may call here to discuss which inhalers seem to provide the most benefit   Merton Border, MD PCCM service Mobile [phone] Pager [phone] 07/14/2018 12:50 PM

## 2018-07-10 NOTE — Patient Instructions (Signed)
Trial of Breo inhaler, one inhalation daily.  Rinse mouth after use  Continue Spiriva inhaler  Continue albuterol as needed  Lung function tests (PFTs) ordered  Follow-up in 3 months.  I will call you with results of the lung function tests.  You may call here to discuss which inhalers seem to provide the most benefit for you.

## 2018-07-14 ENCOUNTER — Ambulatory Visit: Payer: Medicare HMO | Attending: Pulmonary Disease

## 2018-07-14 DIAGNOSIS — R0609 Other forms of dyspnea: Secondary | ICD-10-CM

## 2018-07-14 DIAGNOSIS — J449 Chronic obstructive pulmonary disease, unspecified: Secondary | ICD-10-CM | POA: Diagnosis not present

## 2018-07-14 DIAGNOSIS — R0600 Dyspnea, unspecified: Secondary | ICD-10-CM

## 2018-07-20 NOTE — Progress Notes (Signed)
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   INTERVAL HISTORY: Patient returns to clinic today for laboratory work, discussion of his imaging results, and routine evaluation.  He continues to feel well and remains asymptomatic.  He has no neurologic complaints.  He denies any recent fevers or illnesses.  He has a good appetite and denies weight loss.  He denies any night sweats.  He has no chest pain, cough, hemoptysis, or shortness of breath.  He denies any nausea, vomiting, constipation, or diarrhea.  He has no urinary complaints.  Patient feels at his baseline offers no specific complaints today.  REVIEW OF SYSTEMS:   Review of Systems  Constitutional: Negative.  Negative for fever, malaise/fatigue and weight loss.  Respiratory: Negative.  Negative for cough and shortness of breath.   Cardiovascular: Negative.  Negative for chest pain and leg swelling.  Gastrointestinal: Negative.  Negative for abdominal pain.  Genitourinary: Negative.  Negative for dysuria.  Musculoskeletal: Negative.  Negative for back pain.  Skin: Negative.  Negative for rash.  Neurological: Negative.  Negative for dizziness, focal weakness, weakness and headaches.  Psychiatric/Behavioral: Negative.  The patient is not nervous/anxious.     As per HPI. Otherwise, a complete review of systems is negative.  PAST MEDICAL HISTORY: Past Medical History:  Diagnosis Date  . Anemia   . Arthritis   . Cancer ([place])   . Esophageal reflux   . Hyperlipidemia   . Hypertension   . Pneumonia    recurrent  . Sebaceous cyst     PAST SURGICAL HISTORY: Past Surgical History:  Procedure Laterality Date  . HEMORRHOID SURGERY       FAMILY HISTORY: Family History  Problem Relation Age of Onset  . Heart disease Mother   . Cancer Maternal Aunt        lung  . Lung cancer Maternal Aunt   . Leukemia Maternal Aunt     ADVANCED DIRECTIVES (Y/N):  N  HEALTH MAINTENANCE: Social History   Tobacco Use  . Smoking status: Former Smoker    Packs/day: 1.50    Years: 50.00    Pack years: 75.00    Types: Cigarettes    Last attempt to quit: 09/10/1999    Years since quitting: 18.8  . Smokeless tobacco: Never Used  Substance Use Topics  . Alcohol use: No    Alcohol/week: 0.0 standard drinks  . Drug use: No     Colonoscopy:  PAP:  Bone density:  Lipid panel:  Allergies  Allergen Reactions  . Cephalexin Rash    Current Outpatient Medications  Medication Sig Dispense Refill  . albuterol (PROVENTIL HFA;VENTOLIN HFA) 108 (90 Base) MCG/ACT inhaler Inhale 1-2 puffs into the lungs every 4 (four) hours as needed for wheezing or shortness of breath. 1 Inhaler 10  . amLODipine (NORVASC) 5 MG tablet Take 1 tablet (5 mg total) by mouth 2 (two) times daily. 180 tablet 1  . aspirin EC 81 MG tablet Take by mouth.    Marland Kitchen atorvastatin (LIPITOR) 10 MG tablet Take 1 tablet (10 mg total) by mouth at bedtime. 90 tablet 3  . cholecalciferol (VITAMIN D) 1000 UNITS tablet Take 1,000 Units by mouth daily.    Marland Kitchen dutasteride (AVODART) 0.5 MG capsule Take 0.5 mg  by mouth 2 (two) times a week.    Marland Kitchen ketoconazole (NIZORAL) 2 % cream Apply 1 application topically daily. To 2-3 skin lesions 15 g 0  . Multiple Vitamins-Minerals (MEGA MULTIVITAMIN FOR MEN) TABS Take 2 capsules by mouth daily.    Marland Kitchen omeprazole (PRILOSEC) 20 MG capsule Take 1 capsule (20 mg total) by mouth daily. 30 capsule 6  . sertraline (ZOLOFT) 50 MG tablet Take 1 tablet (50 mg total) by mouth daily. 30 tablet 3  . Tamsulosin HCl (FLOMAX) 0.4 MG CAPS Take 0.4 mg by mouth daily.    Marland Kitchen triamcinolone cream (KENALOG) 0.1 %     . valACYclovir (VALTREX) 500 MG tablet Take 1 tablet  (500 mg total) by mouth every other day. 15 tablet 3   No current facility-administered medications for this visit.     OBJECTIVE: Vitals:   07/23/18 1059  BP: (!) 169/67  Pulse: (!) 57  Resp: 18  Temp: (!) 96.8 F (36 C)     Body mass index is 34.01 kg/m.    ECOG FS:0 - Asymptomatic  General: Well-developed, well-nourished, no acute distress. Eyes: Pink conjunctiva, anicteric sclera. HEENT: Normocephalic, moist mucous membranes, clear oropharnyx. Lungs: Clear to auscultation bilaterally. Heart: Regular rate and rhythm. No rubs, murmurs, or gallops. Abdomen: Soft, nontender, nondistended. No organomegaly noted, normoactive bowel sounds. Musculoskeletal: No edema, cyanosis, or clubbing. Neuro: Alert, answering all questions appropriately. Cranial nerves grossly intact. Skin: No rashes or petechiae noted. Psych: Normal affect. Lymphatics: No cervical, calvicular, axillary or inguinal LAD.  LAB RESULTS:  Lab Results  Component Value Date   NA 135 03/12/2018   K 4.1 03/12/2018   CL 101 03/12/2018   CO2 22 03/12/2018   GLUCOSE 122 (H) 03/12/2018   BUN 19 03/12/2018   CREATININE 1.60 (H) 07/21/2018   CALCIUM 9.1 03/12/2018   PROT 7.2 03/02/2018   ALBUMIN 4.3 03/02/2018   AST 24 03/02/2018   ALT 24 03/02/2018   ALKPHOS 80 03/02/2018   BILITOT 0.3 03/02/2018   GFRNONAA 46 (L) 01/04/2018   GFRAA 54 (L) 01/04/2018    Lab Results  Component Value Date   WBC 9.4 07/23/2018   NEUTROABS 4.6 07/23/2018   HGB 11.3 (L) 07/23/2018   HCT 34.1 (L) 07/23/2018   MCV 85.9 07/23/2018   PLT 209 07/23/2018     STUDIES: Ct Chest W Contrast  Result Date: 07/21/2018 CLINICAL DATA:  CLL/SLL.  Interval observation.  Restaging. EXAM: CT CHEST, ABDOMEN, AND PELVIS WITH CONTRAST TECHNIQUE: Multidetector CT imaging of the chest, abdomen and pelvis was performed following the standard protocol during bolus administration of intravenous contrast. CONTRAST:  24m ISOVUE-300 IOPAMIDOL  (ISOVUE-300) INJECTION 61% COMPARISON:  04/10/2018 PET-CT.  03/12/2018 chest CT. FINDINGS: CT CHEST FINDINGS Cardiovascular: Normal heart size. No significant pericardial effusion/thickening. Three-vessel coronary atherosclerosis. Atherosclerotic nonaneurysmal thoracic aorta. Normal caliber pulmonary arteries. No central pulmonary emboli. Mediastinum/Nodes: No discrete thyroid nodules. Unremarkable esophagus. Mild bilateral axillary adenopathy measuring up to 1.4 cm in the left axilla (series 2/image 17), previously 1.2 cm, mildly increased. Stable enlarged 1.9 cm right paratracheal node (series 2/image 23). Stable enlarged 2.1 cm subcarinal node (series 2/image 33). No new pathologically enlarged mediastinal nodes. No hilar adenopathy. Lungs/Pleura: No pneumothorax. No pleural effusion. Mild centrilobular and paraseptal emphysema with mild diffuse bronchial wall thickening. Apical left upper lobe 4 mm solid pulmonary nodule (series 3/image 35) is stable. New subsolid 1.6 cm medial basilar left lower lobe (series 3/image 129) and 0.5 cm posterior right upper lobe (series  3/image 75) pulmonary nodules. Musculoskeletal: No aggressive appearing focal osseous lesions. Marked thoracic spondylosis. CT ABDOMEN PELVIS FINDINGS Hepatobiliary: Normal liver with no liver mass. Normal gallbladder with no radiopaque cholelithiasis. No biliary ductal dilatation. Pancreas: Normal, with no mass or duct dilation. Spleen: Normal size. No mass. Adrenals/Urinary Tract: Normal adrenals. No hydronephrosis. No renal masses. Stable mild diffuse bladder wall thickening and trabeculation with scattered small bladder diverticula. Stomach/Bowel: Small hiatal hernia. Otherwise normal nondistended stomach. Normal caliber small bowel with no small bowel wall thickening. Normal appendix. Marked sigmoid diverticulosis, with no large bowel wall thickening or significant pericolonic fat stranding. Vascular/Lymphatic: Atherosclerotic nonaneurysmal  abdominal aorta. Patent portal, splenic, hepatic and renal veins. Multiple mildly enlarged mesenteric lymph nodes measuring up to 1.2 cm (series 2/image 71), stable. Mild bilateral external iliac and left inguinal lymphadenopathy, stable. For example 1.0 cm right external iliac node (series 2/image 114), previously 1.0 cm on 04/10/2018, stable. Left inguinal 1.1 cm node (series 2/image 121), previously 1.0 cm using similar measurement technique, not appreciably changed. Mildly enlarged 1.2 cm aortocaval node (series 2/image 77), stable. No new or enlarging lymph nodes in the abdomen or pelvis. Reproductive: Moderately enlarged prostate with nonspecific coarse internal prostatic calcifications. Other: No pneumoperitoneum, ascites or focal fluid collection. Musculoskeletal: No aggressive appearing focal osseous lesions. Moderate lumbar spondylosis. IMPRESSION: 1. Relatively stable adenopathy compatible with indolent lymphoproliferative condition. Mild bilateral axillary adenopathy has mildly increased. Mild mediastinal, mesenteric, retroperitoneal and bilateral pelvic adenopathy is stable. 2. Two new subsolid pulmonary nodules, largest 1.6 cm in the medial basilar left lower lobe, favor inflammatory. Follow-up chest CT advised in 3-6 months in this high risk patient. 3. Stable mild diffuse bladder wall thickening and trabeculation with scattered small bladder diverticula, probably due to chronic bladder outlet obstruction by the moderately enlarged prostate. 4. Aortic Atherosclerosis (ICD10-I70.0) and Emphysema (ICD10-J43.9). Electronically Signed   By: Ilona Sorrel M.D.   On: 07/21/2018 13:11   Ct Abdomen Pelvis W Contrast  Result Date: 07/21/2018 CLINICAL DATA:  CLL/SLL.  Interval observation.  Restaging. EXAM: CT CHEST, ABDOMEN, AND PELVIS WITH CONTRAST TECHNIQUE: Multidetector CT imaging of the chest, abdomen and pelvis was performed following the standard protocol during bolus administration of intravenous  contrast. CONTRAST:  82m ISOVUE-300 IOPAMIDOL (ISOVUE-300) INJECTION 61% COMPARISON:  04/10/2018 PET-CT.  03/12/2018 chest CT. FINDINGS: CT CHEST FINDINGS Cardiovascular: Normal heart size. No significant pericardial effusion/thickening. Three-vessel coronary atherosclerosis. Atherosclerotic nonaneurysmal thoracic aorta. Normal caliber pulmonary arteries. No central pulmonary emboli. Mediastinum/Nodes: No discrete thyroid nodules. Unremarkable esophagus. Mild bilateral axillary adenopathy measuring up to 1.4 cm in the left axilla (series 2/image 17), previously 1.2 cm, mildly increased. Stable enlarged 1.9 cm right paratracheal node (series 2/image 23). Stable enlarged 2.1 cm subcarinal node (series 2/image 33). No new pathologically enlarged mediastinal nodes. No hilar adenopathy. Lungs/Pleura: No pneumothorax. No pleural effusion. Mild centrilobular and paraseptal emphysema with mild diffuse bronchial wall thickening. Apical left upper lobe 4 mm solid pulmonary nodule (series 3/image 35) is stable. New subsolid 1.6 cm medial basilar left lower lobe (series 3/image 129) and 0.5 cm posterior right upper lobe (series 3/image 75) pulmonary nodules. Musculoskeletal: No aggressive appearing focal osseous lesions. Marked thoracic spondylosis. CT ABDOMEN PELVIS FINDINGS Hepatobiliary: Normal liver with no liver mass. Normal gallbladder with no radiopaque cholelithiasis. No biliary ductal dilatation. Pancreas: Normal, with no mass or duct dilation. Spleen: Normal size. No mass. Adrenals/Urinary Tract: Normal adrenals. No hydronephrosis. No renal masses. Stable mild diffuse bladder wall thickening and trabeculation with scattered small bladder diverticula.  Stomach/Bowel: Small hiatal hernia. Otherwise normal nondistended stomach. Normal caliber small bowel with no small bowel wall thickening. Normal appendix. Marked sigmoid diverticulosis, with no large bowel wall thickening or significant pericolonic fat stranding.  Vascular/Lymphatic: Atherosclerotic nonaneurysmal abdominal aorta. Patent portal, splenic, hepatic and renal veins. Multiple mildly enlarged mesenteric lymph nodes measuring up to 1.2 cm (series 2/image 71), stable. Mild bilateral external iliac and left inguinal lymphadenopathy, stable. For example 1.0 cm right external iliac node (series 2/image 114), previously 1.0 cm on 04/10/2018, stable. Left inguinal 1.1 cm node (series 2/image 121), previously 1.0 cm using similar measurement technique, not appreciably changed. Mildly enlarged 1.2 cm aortocaval node (series 2/image 77), stable. No new or enlarging lymph nodes in the abdomen or pelvis. Reproductive: Moderately enlarged prostate with nonspecific coarse internal prostatic calcifications. Other: No pneumoperitoneum, ascites or focal fluid collection. Musculoskeletal: No aggressive appearing focal osseous lesions. Moderate lumbar spondylosis. IMPRESSION: 1. Relatively stable adenopathy compatible with indolent lymphoproliferative condition. Mild bilateral axillary adenopathy has mildly increased. Mild mediastinal, mesenteric, retroperitoneal and bilateral pelvic adenopathy is stable. 2. Two new subsolid pulmonary nodules, largest 1.6 cm in the medial basilar left lower lobe, favor inflammatory. Follow-up chest CT advised in 3-6 months in this high risk patient. 3. Stable mild diffuse bladder wall thickening and trabeculation with scattered small bladder diverticula, probably due to chronic bladder outlet obstruction by the moderately enlarged prostate. 4. Aortic Atherosclerosis (ICD10-I70.0) and Emphysema (ICD10-J43.9). Electronically Signed   By: Ilona Sorrel M.D.   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  CT scan results from July 21, 2018 reviewed independently and reported as above with relatively stable adenopathy  compatible with an indolent lymphoproliferative disorder.  Previously, his flow cytometry panel reported a CD5+ and CD23+ clonal B-cell population, highly suspicious for CLL/SLL.  Patient does not require biopsy at this point, but will consider one in the future if he has enlarging lymph nodes or he becomes symptomatic.  No intervention is needed.  Patient does not require bone marrow biopsy.  Return to clinic in 6 months with repeat imaging and further evaluation.  I spent a total of 30 minutes face-to-face with the patient of which greater than 50% of the visit was spent in counseling and coordination of care as detailed above.    Patient expressed understanding and was in agreement with this plan. He also understands that He can call clinic at any time with any questions, concerns, or complaints.   Cancer Staging No matching staging information was found for the patient.  Lloyd Huger, MD   07/25/2018 10:12 AM

## 2018-07-21 ENCOUNTER — Ambulatory Visit
Admission: RE | Admit: 2018-07-21 | Discharge: 2018-07-21 | Disposition: A | Discharge status: Home / Self Care | Payer: Medicare HMO | Source: Ambulatory Visit | Attending: Oncology | Admitting: Oncology

## 2018-07-21 DIAGNOSIS — C9190 Lymphoid leukemia, unspecified not having achieved remission: Secondary | ICD-10-CM | POA: Insufficient documentation

## 2018-07-21 DIAGNOSIS — N323 Diverticulum of bladder: Secondary | ICD-10-CM | POA: Insufficient documentation

## 2018-07-21 DIAGNOSIS — I700 Atherosclerosis of aorta: Secondary | ICD-10-CM | POA: Insufficient documentation

## 2018-07-21 DIAGNOSIS — R590 Localized enlarged lymph nodes: Secondary | ICD-10-CM | POA: Insufficient documentation

## 2018-07-21 DIAGNOSIS — J439 Emphysema, unspecified: Secondary | ICD-10-CM | POA: Insufficient documentation

## 2018-07-21 DIAGNOSIS — C9110 Chronic lymphocytic leukemia of B-cell type not having achieved remission: Secondary | ICD-10-CM

## 2018-07-21 HISTORY — DX: Malignant (primary) neoplasm, unspecified: C80.1

## 2018-07-21 LAB — POCT I-STAT CREATININE: CREATININE: 1.6 mg/dL — AB (ref 0.61–1.24)

## 2018-07-21 MED ORDER — IOPAMIDOL (ISOVUE-300) INJECTION 61%
100.0000 mL | Freq: Once | INTRAVENOUS | Status: AC | PRN
  Administered 2018-07-21: 80 mL via INTRAVENOUS

## 2018-07-23 ENCOUNTER — Encounter: Payer: Self-pay | Admitting: Oncology

## 2018-07-23 ENCOUNTER — Inpatient Hospital Stay: Payer: Medicare HMO | Attending: Oncology

## 2018-07-23 ENCOUNTER — Inpatient Hospital Stay (HOSPITAL_BASED_OUTPATIENT_CLINIC_OR_DEPARTMENT_OTHER): Payer: Medicare HMO | Admitting: Oncology

## 2018-07-23 VITALS — BP 169/67 | HR 57 | Temp 96.8°F | Resp 18 | Wt 223.7 lb

## 2018-07-23 DIAGNOSIS — C8590 Non-Hodgkin lymphoma, unspecified, unspecified site: Secondary | ICD-10-CM | POA: Diagnosis present

## 2018-07-23 DIAGNOSIS — C8580 Other specified types of non-Hodgkin lymphoma, unspecified site: Secondary | ICD-10-CM | POA: Diagnosis not present

## 2018-07-23 DIAGNOSIS — Z87891 Personal history of nicotine dependence: Secondary | ICD-10-CM | POA: Diagnosis not present

## 2018-07-23 DIAGNOSIS — C9110 Chronic lymphocytic leukemia of B-cell type not having achieved remission: Secondary | ICD-10-CM

## 2018-07-23 DIAGNOSIS — R591 Generalized enlarged lymph nodes: Secondary | ICD-10-CM

## 2018-07-23 DIAGNOSIS — D72829 Elevated white blood cell count, unspecified: Secondary | ICD-10-CM

## 2018-07-23 LAB — CBC WITH DIFFERENTIAL/PLATELET
Basophils Absolute: 0.1 10*3/uL (ref 0–0.1)
Basophils Relative: 1 %
EOS PCT: 7 %
Eosinophils Absolute: 0.6 10*3/uL (ref 0–0.7)
HCT: 34.1 % — ABNORMAL LOW (ref 40.0–52.0)
Hemoglobin: 11.3 g/dL — ABNORMAL LOW (ref 13.0–18.0)
LYMPHS ABS: 3 10*3/uL (ref 1.0–3.6)
LYMPHS PCT: 32 %
MCH: 28.4 pg (ref 26.0–34.0)
MCHC: 33.1 g/dL (ref 32.0–36.0)
MCV: 85.9 fL (ref 80.0–100.0)
Monocytes Absolute: 1 10*3/uL (ref 0.2–1.0)
Monocytes Relative: 11 %
Neutro Abs: 4.6 10*3/uL (ref 1.4–6.5)
Neutrophils Relative %: 49 %
PLATELETS: 209 10*3/uL (ref 150–440)
RBC: 3.97 MIL/uL — AB (ref 4.40–5.90)
RDW: 15.1 % — ABNORMAL HIGH (ref 11.5–14.5)
WBC: 9.4 10*3/uL (ref 3.8–10.6)

## 2018-07-23 NOTE — Progress Notes (Signed)
Patient denies any concerns today.  

## 2018-08-31 ENCOUNTER — Ambulatory Visit: Payer: Medicare HMO | Admitting: Urology

## 2018-08-31 ENCOUNTER — Encounter: Payer: Self-pay | Admitting: Urology

## 2018-08-31 VITALS — BP 113/57 | HR 66 | Ht 68.0 in | Wt 223.0 lb

## 2018-08-31 DIAGNOSIS — N401 Enlarged prostate with lower urinary tract symptoms: Secondary | ICD-10-CM

## 2018-08-31 DIAGNOSIS — N138 Other obstructive and reflux uropathy: Secondary | ICD-10-CM

## 2018-09-01 ENCOUNTER — Encounter: Payer: Self-pay | Admitting: Urology

## 2018-09-01 MED ORDER — TAMSULOSIN HCL 0.4 MG PO CAPS: 0.4000 mg | ORAL_CAPSULE | Freq: Every day | ORAL | 3 refills | Status: DC

## 2018-09-01 MED ORDER — DUTASTERIDE 0.5 MG PO CAPS: 0.5000 mg | ORAL_CAPSULE | Freq: Every day | ORAL | 1 refills | Status: DC

## 2018-09-01 NOTE — Progress Notes (Signed)
08/31/2018 7:52 AM   Donald [name] 11/02/1933 [id number]  Referring provider: [name], MD [address] [place], [place] [address]  Chief Complaint  Patient presents with  . Establish Care    HPI: 84 year old male presents to establish local urologic care.  He has been followed by Dr. Jacqlyn Larsen for several years for BPH with lower urinary tract symptoms.  He is on tamsulosin and dutasteride.  He last saw Dr. Jacqlyn Larsen in July 2018.  PVR at that visit was 276 mL however he states he did not completely empty.  He has mild urinary frequency and urgency.  His voiding symptoms are stable and not bothersome.  He has occasional postvoid dribbling.  Denies dysuria or gross hematuria.  Denies flank, abdominal, pelvic or scrotal pain.   PMH: Past Medical History:  Diagnosis Date  . Anemia   . Arthritis   . Cancer ([place])   . Esophageal reflux   . Hyperlipidemia   . Hypertension   . Pneumonia    recurrent  . Sebaceous cyst     Surgical History: Past Surgical History:  Procedure Laterality Date  . HEMORRHOID SURGERY      Home Medications:  Allergies as of 08/31/2018      Reactions   Cephalexin Rash      Medication List        Accurate as of 08/31/18 11:59 PM. Always use your most recent med list.          albuterol 108 (90 Base) MCG/ACT inhaler Commonly known as:  PROVENTIL HFA;VENTOLIN HFA Inhale 1-2 puffs into the lungs every 4 (four) hours as needed for wheezing or shortness of breath.   amLODipine 5 MG tablet Commonly known as:  NORVASC Take 1 tablet (5 mg total) by mouth 2 (two) times daily.   aspirin EC 81 MG tablet Take by mouth.   atorvastatin 10 MG tablet Commonly known as:  LIPITOR Take 1 tablet (10 mg total) by mouth at bedtime.   BREO ELLIPTA 100-25 MCG/INH Aepb Generic drug:  fluticasone furoate-vilanterol Inhale 1 puff into the lungs daily.   cholecalciferol 1000 units tablet Commonly known as:  VITAMIN D Take 1,000 Units by  mouth daily.   dutasteride 0.5 MG capsule Commonly known as:  AVODART Take 0.5 mg by mouth 2 (two) times a week.   MEGA MULTIVITAMIN FOR MEN Tabs Take 2 capsules by mouth daily.   omeprazole 20 MG capsule Commonly known as:  PRILOSEC Take 1 capsule (20 mg total) by mouth daily.   sertraline 50 MG tablet Commonly known as:  ZOLOFT Take 1 tablet (50 mg total) by mouth daily.   tamsulosin 0.4 MG Caps capsule Commonly known as:  FLOMAX Take 0.4 mg by mouth daily.   triamcinolone cream 0.1 % Commonly known as:  KENALOG   valACYclovir 500 MG tablet Commonly known as:  VALTREX Take 1 tablet (500 mg total) by mouth every other day.       Allergies:  Allergies  Allergen Reactions  . Cephalexin Rash    Family History: Family History  Problem Relation Age of Onset  . Heart disease Mother   . Cancer Maternal Aunt        lung  . Lung cancer Maternal Aunt   . Leukemia Maternal Aunt     Social History:  reports that he quit smoking about 18 years ago. His smoking use included cigarettes. He has a 75.00 pack-year smoking history. He has never used smokeless tobacco. He  reports that he does not drink alcohol or use drugs.  ROS: UROLOGY Frequent Urination?: Yes Hard to postpone urination?: Yes Burning/pain with urination?: No Get up at night to urinate?: Yes Leakage of urine?: Yes Urine stream starts and stops?: No Trouble starting stream?: Yes Do you have to strain to urinate?: No Blood in urine?: No Urinary tract infection?: No Sexually transmitted disease?: Yes Injury to kidneys or bladder?: No Painful intercourse?: No Weak stream?: No Erection problems?: Yes Penile pain?: No  Gastrointestinal Nausea?: No Vomiting?: No Indigestion/heartburn?: No Diarrhea?: No Constipation?: No  Constitutional Fever: No Night sweats?: No Weight loss?: No Fatigue?: Yes  Skin Skin rash/lesions?: Yes Itching?: Yes  Eyes Blurred vision?: No Double vision?:  No  Ears/Nose/Throat Sore throat?: No Sinus problems?: Yes  Hematologic/Lymphatic Swollen glands?: Yes Easy bruising?: Yes  Cardiovascular Leg swelling?: Yes Chest pain?: No  Respiratory Cough?: Yes Shortness of breath?: Yes  Endocrine Excessive thirst?: No  Musculoskeletal Back pain?: Yes Joint pain?: Yes  Neurological Headaches?: No Dizziness?: No  Psychologic Depression?: No Anxiety?: No  Physical Exam: BP (!) 113/57 (BP Location: Left Arm, Patient Position: Sitting, Cuff Size: Normal)   Pulse 66   Ht 5\' 8"  (1.727 m)   Wt 223 lb (101.2 kg)   BMI 33.91 kg/m   Constitutional:  Alert and oriented, No acute distress. HEENT: [place] AT, moist mucus membranes.  Trachea midline, no masses. Cardiovascular: No clubbing, cyanosis, or edema. Respiratory: Normal respiratory effort, no increased work of breathing. GI: Abdomen is soft, nontender, nondistended, no abdominal masses GU: No CVA tenderness.  Refused DRE Lymph: No cervical or inguinal lymphadenopathy. Skin: No rashes, bruises or suspicious lesions. Neurologic: Grossly intact, no focal deficits, moving all 4 extremities. Psychiatric: Normal mood and affect.   Assessment & Plan:   84 year old male with stable lower urinary tract symptoms on combination therapy with tamsulosin and finasteride.  I recommended a follow-up PVR however he left the office before this could be done.  Tamsulosin and dutasteride were refilled.  Continue annual follow-up.   Abbie Sons, [place] [address] [place], [place] [address] [phone]

## 2018-09-08 ENCOUNTER — Other Ambulatory Visit: Payer: Self-pay | Admitting: Internal Medicine

## 2018-09-08 DIAGNOSIS — Z1283 Encounter for screening for malignant neoplasm of skin: Secondary | ICD-10-CM

## 2018-09-14 ENCOUNTER — Ambulatory Visit: Payer: Medicare HMO | Admitting: Family Medicine

## 2018-09-22 ENCOUNTER — Encounter: Payer: Self-pay | Admitting: Family Medicine

## 2018-09-22 ENCOUNTER — Ambulatory Visit (INDEPENDENT_AMBULATORY_CARE_PROVIDER_SITE_OTHER): Payer: Medicare HMO | Admitting: Family Medicine

## 2018-09-22 DIAGNOSIS — C9110 Chronic lymphocytic leukemia of B-cell type not having achieved remission: Secondary | ICD-10-CM

## 2018-09-22 DIAGNOSIS — R1314 Dysphagia, pharyngoesophageal phase: Secondary | ICD-10-CM

## 2018-09-22 DIAGNOSIS — I10 Essential (primary) hypertension: Secondary | ICD-10-CM

## 2018-09-22 DIAGNOSIS — J449 Chronic obstructive pulmonary disease, unspecified: Secondary | ICD-10-CM | POA: Diagnosis not present

## 2018-09-22 NOTE — Patient Instructions (Signed)
Nice to see you. We will request records from the ear nose and throat physician to determine what evaluation has been done for your trouble swallowing. We will check with our clinical pharmacist regarding possible covered medications for your emphysema.

## 2018-09-24 NOTE — Assessment & Plan Note (Addendum)
Discussed continuing to follow with oncology.  They will continue to monitor.  If he develops any symptoms the patient will contact oncology immediately.

## 2018-09-24 NOTE — Assessment & Plan Note (Signed)
Blood pressure stable.  Patient's diastolic blood pressure limits additional treatment.  He will continue with his current regimen.

## 2018-09-24 NOTE — Assessment & Plan Note (Signed)
Relatively stable.  Seems to have some symptoms despite stability.  We will check with our clinical pharmacist regarding prescription coverage through the [facility] for alternative inhalers.

## 2018-09-24 NOTE — Progress Notes (Signed)
  Tommi Rumps, MD Phone: [phone]  Donald [name] is a 84 y.o. male who presents today for f/u.  CC: hld, htn, CLL, emphysema, dysphagia  Hyperlipidemia: Taking Lipitor.  No right upper quadrant pain or myalgias.  Hypertension: Typically runs 150/60 though does occasionally get up to 170/70 at nighttime.  Taking amlodipine.  No chest pain.  He has chronic dyspnea on exertion related to emphysema.  This has not worsened.  He has chronic left ankle edema that is unchanged.  CLL: Patient continues to see oncology.  He notes no symptoms.  It appears that his disease has been stable and they are monitoring.  Emphysema: Patient notes chronic cough that has been stable.  It is nonproductive.  He does not have albuterol as it cost too much.  He uses Spiriva.  He notes his dyspnea has worsened over the last year though has been relatively stable recently.  He did try Breo though it was not helpful.  Dysphagia: Patient notes for the last year or so he will get a little choked when he eats.  He feels a tickle in the back of his throat and he will gag.  This occurs 1-2 times per meal.  He has seen ear nose and throat for this.  He has no sticking sensation of food in his esophagus.  Social History   Tobacco Use  Smoking Status Former Smoker  . Packs/day: 1.50  . Years: 50.00  . Pack years: 75.00  . Types: Cigarettes  . Last attempt to quit: 09/10/1999  . Years since quitting: 19.0  Smokeless Tobacco Never Used     ROS see history of present illness  Objective  Physical Exam Vitals:   09/22/18 1358  BP: (!) 160/58  Pulse: 64  Temp: 98.2 F (36.8 C)  SpO2: 96%    BP Readings from Last 3 Encounters:  09/22/18 (!) 160/58  08/31/18 (!) 113/57  07/23/18 (!) 169/67   Wt Readings from Last 3 Encounters:  09/22/18 226 lb (102.5 kg)  08/31/18 223 lb (101.2 kg)  07/23/18 223 lb 11.2 oz (101.5 kg)    Physical Exam  Constitutional: No distress.  HENT:  Mouth/Throat:  Oropharynx is clear and moist.  Cardiovascular: Normal rate, regular rhythm and normal heart sounds.  Pulmonary/Chest: Effort normal and breath sounds normal.  Musculoskeletal: He exhibits no edema.  Neurological: He is alert.  Skin: Skin is warm and dry. He is not diaphoretic.     Assessment/Plan: Please see individual problem list.  Essential hypertension Blood pressure stable.  Patient's diastolic blood pressure limits additional treatment.  He will continue with his current regimen.  Chronic obstructive pulmonary disease (HCC) Relatively stable.  Seems to have some symptoms despite stability.  We will check with our clinical pharmacist regarding prescription coverage through the [facility] for alternative inhalers.  Dysphagia, pharyngoesophageal phase Patient has continued to have issues with this.  He has seen ENT for this and we will request their records.  CLL (chronic lymphocytic leukemia) ([place]) Discussed continuing to follow with oncology.  They will continue to monitor.  If he develops any symptoms the patient will contact oncology immediately.   No orders of the defined types were placed in this encounter.   No orders of the defined types were placed in this encounter.    Tommi Rumps, MD [place]

## 2018-09-24 NOTE — Assessment & Plan Note (Signed)
Patient has continued to have issues with this.  He has seen ENT for this and we will request their records.

## 2018-09-25 ENCOUNTER — Telehealth: Payer: Self-pay | Admitting: Family Medicine

## 2018-09-25 NOTE — Telephone Encounter (Signed)
Please let the patient know I heard back from our clinical pharmacist regarding COPD medication.  Please contact the patient and see if he would prefer the medication to be sent to his local pharmacy or if he would prefer to get it from the [place].  If you would prefer to get it from the [place] the preferred option is Stiolto Respimat.  If you would prefer to get it locally the preferred option would be Anoro Ellipta.

## 2018-09-25 NOTE — Telephone Encounter (Signed)
-----   Message from De Hollingshead, [facility] sent at 09/25/2018  1:48 PM EST ----- Hi!   It sounds like he may need to escalate from LAMA therapy to LABA/LAMA combination. It appears that the [facility] LABA/LAMA for the [facility] is Stiolto Respimat (tiotropium/olodaterol) two inhalations once daily.    However, it looks like the two most recently prescribed medications for [facility] to [facility]. It appears he also has [place] prescription insurance. If he wants to get the inhaler at [facility] with [employer], the covered LABA/LAMA is Anoro Ellipta (umeclidinium/vilanterol) one puff once daily.  I guess see if Wilburn Cornelia could reach out and see which pharmacy location is his preference for this?  Catie  ----- Message ----- From: [name], MD Sent: 09/24/2018   1:30 PM EST To: De Hollingshead, [facility]  Hey Catie,   This patient has prescription coverage through the [place]. He has not had luck with Breo for his COPD and still has some symptoms on spiriva. I was wondering if you would be able to tell what the covered meds were through the [place] for COPD. Thanks.  Randall Hiss

## 2018-09-28 NOTE — Telephone Encounter (Signed)
Called and spoke with patient. Pt stated that he would like the Rx to go to the [place]. Pt is aware that you will be sending in Stiolto Respimat.   Sent to PCP

## 2018-09-29 MED ORDER — TIOTROPIUM BROMIDE-OLODATEROL 2.5-2.5 MCG/ACT IN AERS: 5.0000 ug | INHALATION_SPRAY | Freq: Every day | RESPIRATORY_TRACT | 2 refills | Status: DC

## 2018-09-29 NOTE — Telephone Encounter (Signed)
Prescription printed.  Please make available for pickup.

## 2018-09-29 NOTE — Telephone Encounter (Signed)
Called and spoke with patient. Pt advised and voiced understanding. Rx has been placed up front for pick up.

## 2018-09-29 NOTE — Addendum Note (Signed)
Addended by: Caryl Bis,  G on: 09/29/2018 02:16 PM   Modules accepted: Orders

## 2018-09-29 NOTE — Addendum Note (Signed)
Addended by: Caryl Bis, Yussef Jorge G on: 09/29/2018 02:09 PM   Modules accepted: Orders

## 2018-10-12 ENCOUNTER — Ambulatory Visit: Payer: Medicare HMO

## 2018-10-13 ENCOUNTER — Ambulatory Visit: Payer: Medicare HMO | Admitting: Pulmonary Disease

## 2018-10-13 ENCOUNTER — Encounter: Payer: Self-pay | Admitting: Pulmonary Disease

## 2018-10-13 VITALS — BP 118/60 | HR 80 | Resp 16 | Ht 68.0 in | Wt 223.0 lb

## 2018-10-13 DIAGNOSIS — J449 Chronic obstructive pulmonary disease, unspecified: Secondary | ICD-10-CM

## 2018-10-13 DIAGNOSIS — R0609 Other forms of dyspnea: Secondary | ICD-10-CM | POA: Diagnosis not present

## 2018-10-13 DIAGNOSIS — R0600 Dyspnea, unspecified: Secondary | ICD-10-CM

## 2018-10-13 MED ORDER — TIOTROPIUM BROMIDE-OLODATEROL 2.5-2.5 MCG/ACT IN AERS: 2.0000 | INHALATION_SPRAY | Freq: Every day | RESPIRATORY_TRACT | 10 refills | Status: DC

## 2018-10-13 MED ORDER — TIOTROPIUM BROMIDE-OLODATEROL 2.5-2.5 MCG/ACT IN AERS: 5.0000 ug | INHALATION_SPRAY | Freq: Every day | RESPIRATORY_TRACT | 2 refills | Status: DC

## 2018-10-13 NOTE — Patient Instructions (Addendum)
Continue Stiolto inhaler daily Recommend advancing activity level Recommend modest weight loss Follow-up in 1 year.  Call sooner if needed

## 2018-10-13 NOTE — Progress Notes (Signed)
PULMONARY OFFICE FOLLOW UP NOTE  Requesting MD/Service: self referred Date of initial consultation: 11/27/15 Reason for consultation: dyspnea, cough, former smoker, mild COPD  PT PROFILE: 84 y.o. M smoker of > 50 PYs previously seen by Dr [place] Bells and diagnosed with COPD. Self referred for eval of DOE and occasional cough productive of scant discolored mucus. PFTs 2014 revealed very mild obstruction  PROBLEMS: COPD - mild by PFTs 2014 Former smoker Class I dyspnea  DATA: 01/03/18 CT chest: Left greater than right dependent basilar opacities may indicate pneumonia and/or aspiration pneumonitis. Multiple enlarged mediastinal lymph nodes, measuring up to 2.4 cm, of uncertain etiology. 4 mm LUL nodule and medial right basilar pulmonary nodule measures 1.3 cm 03/12/18 CT chest: Persistent mediastinal and axillary lymphadenopathy which is nonspecific however concerning for the possibility of lymphoma given persistent nature, size and distribution. Recommend clinical and laboratory correlation. Interval resolution of previously described right lower lobe nodule. Persistent to increased left lower lobe ground-glass and patchy consolidative opacities which may represent worsening infectious/inflammatory process PET 04/09/18: Mildly hypermetabolic right paratracheal, subcarinal, central mesenteric, right external iliac and left inguinal lymphadenopathy, suggestive of a lymphoproliferative disorder. The most FDG avid nodes are in the mediastinum (in particular, subcarinal) CT chest 07/21/18: Relatively stable adenopathy compatible with indolent lymphoproliferative condition. Mild bilateral axillary adenopathy has mildly increased. Mild mediastinal, mesenteric, retroperitoneal and bilateral pelvic adenopathy is stable PFTs 07/14/18: Very mild obstruction. Normal lung volumes. Mild decrease in DLCO. DLCO/VA low normal  INTERVAL HISTORY: No major pulmonary events  SUBJ: This is a scheduled return office  visit. He remains SOB with mild to moderate exertion. He denies CP, fever, purulent sputum, hemoptysis, LE edema and calf tenderness. He has been changed to [employer] inhaler by his primary care MD due to lower copay. He rarely uses albuterol rescue inhaler.   OBJ: Vitals:   10/13/18 1349 10/13/18 1354  BP:  118/60  Pulse:  80  Resp: 16   SpO2:  94%  Weight: 223 lb (101.2 kg)   Height: 5\' 8"  (1.727 m)   RA  EXAM:  Gen: NAD HEENT: WNL Lungs: Breath sounds ful without wheezes Cardiovascular: RRR, No M Abdomen: Soft, nontender, normal BS Ext: No C/C/E Neuro: No focal deficits   DATA: BMP Latest Ref Rng & Units 07/21/2018 03/12/2018 03/02/2018  Glucose 70 - 99 mg/dL - 122(H) 99  BUN 6 - 23 mg/dL - 19 22  Creatinine 0.61 - 1.24 mg/dL 1.60(H) 1.63(H) 1.30  Sodium 135 - 145 mEq/L - 135 137  Potassium 3.5 - 5.1 mEq/L - 4.1 4.8  Chloride 96 - 112 mEq/L - 101 102  CO2 19 - 32 mEq/L - 22 26  Calcium 8.4 - 10.5 mg/dL - 9.1 9.7    CBC Latest Ref Rng & Units 07/23/2018 04/13/2018 03/17/2018  WBC 3.8 - 10.6 K/uL 9.4 11.8(H) 11.7(H)  Hemoglobin 13.0 - 18.0 g/dL 11.3(L) 12.0(L) 11.6(L)  Hematocrit 40.0 - 52.0 % 34.1(L) 35.7(L) 35.1(L)  Platelets 150 - 440 K/uL 209 193 295    CXR: N0 new film  IMPRESSION:   ICD-10-CM   1. COPD, very mild ([place]) J44.9   2. DOE (dyspnea on exertion) R06.09     Level of dyspnea is much out of proportion to his very mild obstruction. He is to see cardiology next week. If no cardiac explanation for his DOE, I have recommended modest weight loss and reconditioning program  PLAN: Continue Stiolto inhaler daily Continue albuterol MDI as needed Recommend advancing activity level Recommend modest weight  loss Follow-up in 1 year.  Call sooner if needed   Merton Border, MD PCCM service Mobile [phone] Pager [phone] 10/13/2018 8:13 PM

## 2018-10-16 ENCOUNTER — Ambulatory Visit: Payer: Medicare HMO

## 2018-10-21 ENCOUNTER — Ambulatory Visit (INDEPENDENT_AMBULATORY_CARE_PROVIDER_SITE_OTHER): Payer: Medicare HMO

## 2018-10-21 VITALS — BP 132/72 | HR 60 | Temp 97.6°F | Resp 16 | Ht 67.5 in | Wt 224.0 lb

## 2018-10-21 DIAGNOSIS — Z0000 Encounter for general adult medical examination without abnormal findings: Secondary | ICD-10-CM

## 2018-10-21 NOTE — Patient Instructions (Addendum)
  Donald [name] , Thank you for taking time to come for your Medicare Wellness Visit. I appreciate your ongoing commitment to your health goals. Please review the following plan we discussed and let me know if I can assist you in the future.   Bring a copy of your [place] and/or Living Will to be scanned into chart.  Keep all routine scheduled appointments with the [facility] and your primary care provider.   Have a great day!  These are the goals we discussed: Goals      Patient Stated   . Increase physical activity (pt-stated)     Walk for exercise as tolerated       This is a list of the screening recommended for you and due dates:  Health Maintenance  Topic Date Due  . Complete foot exam   11/25/1942  . Eye exam for diabetics  11/25/1942  . Urine Protein Check  06/21/2015  . Hemoglobin A1C  09/01/2018  . Tetanus Vaccine  09/09/2021  . Flu Shot  Completed  . Pneumonia vaccines  Completed

## 2018-10-21 NOTE — Progress Notes (Signed)
Subjective:   Donald [name] is a 84 y.o. male who presents for Medicare Annual/Subsequent preventive examination.  Review of Systems:  No ROS.  Medicare Wellness Visit. Additional risk factors are reflected in the social history. Cardiac Risk Factors include: advanced age (>35men, >91 women);hypertension;diabetes mellitus;male gender;obesity (BMI >30kg/m2)     Objective:    Vitals: BP 132/72 (BP Location: Left Arm, Patient Position: Sitting, Cuff Size: Normal)   Pulse 60   Temp 97.6 F (36.4 C) (Oral)   Resp 16   Ht 5' 7.5" (1.715 m)   Wt 224 lb (101.6 kg)   SpO2 96%   BMI 34.57 kg/m   Body mass index is 34.57 kg/m.  Advanced Directives 10/21/2018 04/13/2018 03/17/2018 03/04/2018 01/03/2018 01/03/2018 10/08/2017  Does Patient Have a Medical Advance Directive? Yes Yes Yes Yes Yes Yes Yes  Type of Paramedic of [place];Living will [place];Living will [place];Living will [place];Living will Living will Living will [place];Living will  Does patient want to make changes to medical advance directive? No - Patient declined - - No - Patient declined No - Patient declined - No - Patient declined  Copy of [place] in Chart? No - copy requested No - copy requested No - copy requested - - - No - copy requested    Tobacco Social History   Tobacco Use  Smoking Status Former Smoker  . Packs/day: 1.50  . Years: 50.00  . Pack years: 75.00  . Types: Cigarettes  . Last attempt to quit: 09/10/1999  . Years since quitting: 19.1  Smokeless Tobacco Never Used     Counseling given: Not Answered   Clinical Intake:  Pre-visit preparation completed: Yes  Pain : No/denies pain     Nutritional Status: BMI > 30  Obese Diabetes: Yes(Followed [facility] and [facility])  How often do you need to have someone help you when you read instructions, pamphlets, or other written  materials from your doctor or pharmacy?: 1 - Never  Interpreter Needed?: No     Past Medical History:  Diagnosis Date  . Anemia   . Arthritis   . Cancer ([place])   . Esophageal reflux   . Hyperlipidemia   . Hypertension   . Pneumonia    recurrent  . Sebaceous cyst    Past Surgical History:  Procedure Laterality Date  . HEMORRHOID SURGERY     Family History  Problem Relation Age of Onset  . Heart disease Mother   . Cancer Maternal Aunt        lung  . Lung cancer Maternal Aunt   . Leukemia Maternal Aunt    Social History   Socioeconomic History  . Marital status: Married    Spouse name: Donald [name]  . Number of children: 2  . Years of education: Not on file  . Highest education level: Not on file  Occupational History  . Not on file  Social Needs  . Financial resource strain: Not hard at all  . Food insecurity:    Worry: Never true    Inability: Never true  . Transportation needs:    Medical: No    Non-medical: No  Tobacco Use  . Smoking status: Former Smoker    Packs/day: 1.50    Years: 50.00    Pack years: 75.00    Types: Cigarettes    Last attempt to quit: 09/10/1999    Years since quitting:  19.1  . Smokeless tobacco: Never Used  Substance and Sexual Activity  . Alcohol use: No    Alcohol/week: 0.0 standard drinks  . Drug use: No  . Sexual activity: Yes  Lifestyle  . Physical activity:    Days per week: Not on file    Minutes per session: Not on file  . Stress: Not on file  Relationships  . Social connections:    Talks on phone: Not on file    Gets together: Not on file    Attends religious service: Not on file    Active member of club or organization: Not on file    Attends meetings of clubs or organizations: Not on file    Relationship status: Not on file  Other Topics Concern  . Not on file  Social History Narrative   Lives in [place] with wife. Dog and cat in home.      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  Dietitian.       Diet - regular      Exercise - Walking    Outpatient Encounter Medications as of 10/21/2018  Medication Sig  . albuterol (PROVENTIL HFA;VENTOLIN HFA) 108 (90 Base) MCG/ACT inhaler Inhale 1-2 puffs into the lungs every 4 (four) hours as needed for wheezing or shortness of breath.  Marland Kitchen amLODipine (NORVASC) 5 MG tablet Take 1 tablet (5 mg total) by mouth 2 (two) times daily.  Marland Kitchen aspirin EC 81 MG tablet Take by mouth.  Marland Kitchen atorvastatin (LIPITOR) 10 MG tablet Take 1 tablet (10 mg total) by mouth at bedtime.  . cholecalciferol (VITAMIN D) 1000 UNITS tablet Take 1,000 Units by mouth daily.  Marland Kitchen dutasteride (AVODART) 0.5 MG capsule Take 1 capsule (0.5 mg total) by mouth daily.  . Multiple Vitamins-Minerals (MEGA MULTIVITAMIN FOR MEN) TABS Take 2 capsules by mouth daily.  Marland Kitchen omeprazole (PRILOSEC) 20 MG capsule Take 1 capsule (20 mg total) by mouth daily.  . sertraline (ZOLOFT) 50 MG tablet Take 1 tablet (50 mg total) by mouth daily.  . tamsulosin (FLOMAX) 0.4 MG CAPS capsule Take 1 capsule (0.4 mg total) by mouth daily.  . Tiotropium Bromide-Olodaterol (STIOLTO RESPIMAT) 2.5-2.5 MCG/ACT AERS Inhale 2 puffs into the lungs daily.  Marland Kitchen triamcinolone cream (KENALOG) 0.1 %   . valACYclovir (VALTREX) 500 MG tablet Take 1 tablet (500 mg total) by mouth every other day.   No facility-administered encounter medications on file as of 10/21/2018.     Activities of Daily Living In your present state of health, do you have any difficulty performing the following activities: 10/21/2018 01/03/2018  Hearing? Y [place]? N N  Difficulty concentrating or making decisions? N N  Walking or climbing stairs? Y N  Comment COPD. He paces himself -  Dressing or bathing? N N  Doing errands, shopping? N N  Preparing Food and eating ? N -  Using the Toilet? N -  In the past six months, have you accidently leaked urine? N -  Do you have problems with loss of bowel control? N -  Managing your Medications? N -    Managing your Finances? N -  Housekeeping or managing your Housekeeping? N -  Some recent data might be hidden    Patient Care Team: Donald Haven, MD as PCP - General (Family Medicine) Donald Merritts, MD as Consulting Physician (Cardiology)   Assessment:   This is a routine wellness examination for Donald [name].  Health Screenings  Colonoscopy-2011. aged  out PSA-completed 10/09/17. Results 2.16 Bone Density-2010 Glaucoma-none reported.  Retinopathy-none reported. Vision exam due 11/2018 Hearing-HOH. Does not wear hearing aids as directed.  Hemoglobin A1C- completed 03/02/18. Results 6.6 Cholesterol-completed 10/09/17. 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 Denies needing assistance with: driving, feeding themselves, getting from bed to chair, getting to the toilet, bathing/showering, dressing, managing money, climbing flight of stairs, or preparing meals.   Depression Screen Patient denies losing interest in daily life, feeling hopeless, or crying easily over simple problems.   Fall Screen Patient denies being afraid of falling or falling in the last year.  No falls in the last 2 months, previously reviewed with his doctor.   Memory Screen Patient denies problems with memory, misplacing items, and is able to balance checkbook/bank accounts.  Patient is alert, normal appearance, oriented to person/place/and time. Correctly identified the president of the [place], recall of 2 objects, and performing simple  calculations.  Patient displays appropriate judgement and can read correct time from watch face.   Immunizations The following Immunizations are up to date: Influenza, shingles, pneumonia, and tetanus.   Other Providers Patient Care Team: Donald Haven, MD as PCP - General (Family Medicine) Donald Merritts, MD as Consulting Physician (Cardiology)   Exercise Activities and Dietary recommendations Current Exercise Habits: Home exercise routine, Type of exercise: walking, Time (Minutes): 15, Frequency (Times/Week): 2, Weekly Exercise (Minutes/Week): 30, Intensity: Mild  Goals      Patient Stated   . Increase physical activity (pt-stated)     Walk for exercise as tolerated       Fall Risk Fall Risk  10/21/2018 09/22/2018 03/12/2018 10/08/2017 10/08/2016  Falls in the past year? 0 1 No Yes No  Comment Not in the last 2 months - - - -  Number falls in past yr: - 1 - 1 -  Injury with Fall? - 0 - No -  Follow up - - - Falls prevention discussed;Education provided -   Depression Screen PHQ 2/9 Scores 10/21/2018 09/22/2018 03/12/2018 10/08/2017  PHQ - 2 Score 0 0 0 0  PHQ- 9 Score - - - 0    Cognitive Function MMSE - Mini Mental State Exam 10/08/2017 10/02/2015  Orientation to time 5 5  Orientation to Place 5 5  Registration 3 3  Attention/ Calculation 5 5  Recall 3 3  Language- name 2 objects 2 2  Language- repeat 1 1  Language- follow 3 step command 3 3  Language- read & follow direction 1 1  Write a sentence 1 1  Copy design 1 1  Total score 30 30     6CIT Screen 10/21/2018 10/08/2016  What Year? 0 points 0 points  What month? 0 points 0 points  What time? 0 points 0 points  Count back from 20 0 points 0 points  Months in reverse 0 points 0 points  Repeat phrase 0 points 0 points  Total Score 0 0    Immunization History  Administered Date(s) Administered  . Influenza Split 08/05/2011, 08/19/2012, 07/29/2013  . Influenza, High Dose Seasonal PF 08/05/2016,  08/06/2017, 07/12/2018  .  Influenza,inj,Quad PF,6+ Mos 07/20/2014  . Influenza-Unspecified 08/11/2015, 07/23/2017  . Pneumococcal Conjugate-13 03/18/2014  . Pneumococcal Polysaccharide-23 03/18/2008  . Tdap 09/10/2011   Screening Tests Health Maintenance  Topic Date Due  . FOOT EXAM  11/25/1942  . OPHTHALMOLOGY EXAM  11/25/1942  . URINE MICROALBUMIN  06/21/2015  . HEMOGLOBIN A1C  09/01/2018  . TETANUS/TDAP  09/09/2021  . INFLUENZA VACCINE  Completed  . PNA vac Low Risk Adult  Completed       Plan:    End of life planning; Advance aging; Advanced directives discussed. Copy of current HCPOA/Living Will requested.    Keep all routine scheduled appointments with the [facility] and your primary care provider.   I have personally reviewed and noted the following in the patient's chart:   . Medical and social history . Use of alcohol, tobacco or illicit drugs  . Current medications and supplements . Functional ability and status . Nutritional status . Physical activity . Advanced directives . List of other physicians . Hospitalizations, surgeries, and ER visits in previous 12 months . Vitals . Screenings to include cognitive, depression, and falls . Referrals and appointments  In addition, I have reviewed and discussed with patient certain preventive protocols, quality metrics, and best practice recommendations. A written personalized care plan for preventive services as well as general preventive health recommendations were provided to patient.     Varney Biles, LPN  [id number]

## 2018-10-22 NOTE — Progress Notes (Signed)
I have reviewed the above note and agree.  Ebb Carelock, M.D.  

## 2018-11-14 DIAGNOSIS — J209 Acute bronchitis, unspecified: Secondary | ICD-10-CM | POA: Diagnosis not present

## 2018-11-14 DIAGNOSIS — R509 Fever, unspecified: Secondary | ICD-10-CM | POA: Diagnosis not present

## 2018-12-23 DIAGNOSIS — R001 Bradycardia, unspecified: Secondary | ICD-10-CM | POA: Diagnosis not present

## 2018-12-23 DIAGNOSIS — I6523 Occlusion and stenosis of bilateral carotid arteries: Secondary | ICD-10-CM | POA: Diagnosis not present

## 2018-12-23 DIAGNOSIS — R0602 Shortness of breath: Secondary | ICD-10-CM | POA: Diagnosis not present

## 2018-12-23 DIAGNOSIS — J431 Panlobular emphysema: Secondary | ICD-10-CM | POA: Diagnosis not present

## 2018-12-23 DIAGNOSIS — I10 Essential (primary) hypertension: Secondary | ICD-10-CM | POA: Diagnosis not present

## 2018-12-28 ENCOUNTER — Ambulatory Visit: Payer: Self-pay

## 2018-12-28 NOTE — Telephone Encounter (Signed)
Wife called to report pt. has been falling frequently.  Has fallen 4 times over past week.  Pt. Able to speak to nurse and answer assess. questions.  Reported he feels like his "equilibrium is off."  Stated he can be standing and then when he starts to walk, he loses his balance.  Denied any dizziness.  Denied numbness or weakness in extremities.  Stated he feels overall weakness.  Reported he gets tired easily, and wants to sleep a lot.  Denied any difficulty with speech, unilateral weakness of extremities, numbness/ tingling in extremities, or change in vision.  Reported he is short of breath with activity, and that this has been going on about 6 mos.  Reported he is not able to walk as far as he used to, due to the SOB.  Due to c/o moderate overall weakness and frequent falls, sched. Appt. 12/29/18.  Care advice given with recommendation to avoid walking without shoes on, change position gradually, and to avoid climbing on furniture or ladders.  Also encouraged to stay hydrated.  Verb. Understanding.             Reason for Disposition . [1] MODERATE weakness (i.e., interferes with work, school, normal activities) AND [2] persists > 3 days    Freq. Falling; has fallen 4 times over past week.  Answer Assessment - Initial Assessment Questions 1. SYMPTOM: "What is the main symptom you are concerned about?" (e.g., weakness, numbness)     Frequent falls; "equilibrium if off"   2. ONSET: "When did this start?" (minutes, hours, days; while sleeping)    Has been going on for awhile  3. LAST NORMAL: "When was the last time you were normal (no symptoms)?"     *No Answer* 4. PATTERN "Does this come and go, or has it been constant since it started?"  "Is it present now?"     Intermittent  5. CARDIAC SYMPTOMS: "Have you had any of the following symptoms: chest pain, difficulty breathing, palpitations?"     Denied chest pain, gets SOB with walking; has been occurring for about 6 mos.   6. NEUROLOGIC SYMPTOMS:  "Have you had any of the following symptoms: headache, dizziness, vision loss, double vision, changes in speech, unsteady on your feet?"     C/o general weakness, denied dizziness, loses balance easily over past 2 mos., denied vision changes; denied difficulty with speech 7. OTHER SYMPTOMS: "Do you have any other symptoms?"     Diagnosed with Lymphoma 8. PREGNANCY: "Is there any chance you are pregnant?" "When was your last menstrual period?"     n/a  Answer Assessment - Initial Assessment Questions 1. DESCRIPTION: "Describe how you are feeling."     C/o general weakness  2. SEVERITY: "How bad is it?"  "Can you stand and walk?"   - MILD - Feels weak or tired, but does not interfere with work, school or normal activities   - [place] to stand and walk; weakness interferes with work, school, or normal activities   - SEVERE - Unable to stand or walk     Moderate; unable to walk as far as he used to;  3. ONSET:  "When did the weakness begin?"     Over past 3-4 mos.  4. CAUSE: "What do you think is causing the weakness?"     Gets short of breath  5. MEDICINES: "Have you recently started a new medicine or had a change in the amount of a medicine?"  Denied  6. OTHER SYMPTOMS: "Do you have any other symptoms?" (e.g., chest pain, fever, cough, SOB, vomiting, diarrhea, bleeding, other areas of pain)     Loses balance easily x 2 mos.  7. PREGNANCY: "Is there any chance you are pregnant?" "When was your last menstrual period?"     N/a  Protocols used: WEAKNESS (GENERALIZED) AND FATIGUE-A-AH, NEUROLOGIC DEFICIT-A-AH

## 2018-12-29 ENCOUNTER — Encounter: Payer: Self-pay | Admitting: Family Medicine

## 2018-12-29 ENCOUNTER — Ambulatory Visit (INDEPENDENT_AMBULATORY_CARE_PROVIDER_SITE_OTHER): Payer: Medicare Other | Admitting: Family Medicine

## 2018-12-29 VITALS — BP 140/56 | HR 82 | Temp 98.5°F | Resp 20 | Ht 68.0 in | Wt 222.8 lb

## 2018-12-29 DIAGNOSIS — R296 Repeated falls: Secondary | ICD-10-CM

## 2018-12-29 DIAGNOSIS — I6523 Occlusion and stenosis of bilateral carotid arteries: Secondary | ICD-10-CM | POA: Diagnosis not present

## 2018-12-29 DIAGNOSIS — R29818 Other symptoms and signs involving the nervous system: Secondary | ICD-10-CM | POA: Diagnosis not present

## 2018-12-29 DIAGNOSIS — Z8673 Personal history of transient ischemic attack (TIA), and cerebral infarction without residual deficits: Secondary | ICD-10-CM | POA: Diagnosis not present

## 2018-12-29 LAB — CBC
HCT: 33 % — ABNORMAL LOW (ref 39.0–52.0)
Hemoglobin: 10.6 g/dL — ABNORMAL LOW (ref 13.0–17.0)
MCHC: 32.2 g/dL (ref 30.0–36.0)
MCV: 87.1 fl (ref 78.0–100.0)
Platelets: 208 10*3/uL (ref 150.0–400.0)
RBC: 3.79 Mil/uL — ABNORMAL LOW (ref 4.22–5.81)
RDW: 15.7 % — ABNORMAL HIGH (ref 11.5–15.5)
WBC: 11.2 10*3/uL — ABNORMAL HIGH (ref 4.0–10.5)

## 2018-12-29 LAB — COMPREHENSIVE METABOLIC PANEL
ALT: 17 U/L (ref 0–53)
AST: 20 U/L (ref 0–37)
Albumin: 4.1 g/dL (ref 3.5–5.2)
Alkaline Phosphatase: 62 U/L (ref 39–117)
BUN: 38 mg/dL — AB (ref 6–23)
CALCIUM: 9.3 mg/dL (ref 8.4–10.5)
CHLORIDE: 104 meq/L (ref 96–112)
CO2: 23 mEq/L (ref 19–32)
CREATININE: 1.64 mg/dL — AB (ref 0.40–1.50)
GFR: 40.21 mL/min — ABNORMAL LOW (ref >60.00)
Glucose, Bld: 101 mg/dL — ABNORMAL HIGH (ref 70–99)
Potassium: 4.6 mEq/L (ref 3.5–5.1)
SODIUM: 135 meq/L (ref 135–145)
Total Bilirubin: 0.3 mg/dL (ref 0.2–1.2)
Total Protein: 6.7 g/dL (ref 6.0–8.3)

## 2018-12-29 NOTE — Progress Notes (Signed)
Subjective:    Patient ID: Donald [name], male    DOB: 05/13/1933, 85 y.o.   MRN: [id number]  HPI   Patient presents to clinic complaining of increased incidence of falling over the last 3 to 4 months.  Patient states he was having issues similar to this back in May 2019, had an MRI of his head due to mini stroke.  Patient states after that he was falling every other day, then it resolved and he was back to his normal self.  Patient states slowly over the last 3 to 4 months he noticed himself falling maybe once or twice a month, and now he is feels like he is falling at least every other day over the past 2 weeks.  Denies feeling dizzy or as if the room is spinning, just states he will lose his balance and fall usually hitting his arm on doorknobs or edges of countertops.  States he did bump his head on that side of the door frame yesterday, did not lose consciousness.    Denies any headache.  Denies any loss of visual field.  Denies any facial droop or speech difficulty.  Denies any one-sided extremity weakness.  Denies any numbness or tingling in extremities.  Denies any chest pain, shortness of breath or wheezing.  Patient reports he did see cardiology on 12th of February, and recently also had a carotid Doppler due to history of bilateral carotid artery stenosis.  He does take aspirin 81 mg daily and also atorvastatin 10 mg daily.  I reviewed his recent cardiology note and also his MRI brain from 03/2018  Patient Active Problem List   Diagnosis Date Noted  . Bilateral carotid artery stenosis 04/01/2018  . Stroke (cerebrum) ([place]) 03/19/2018  . Leukocytosis 03/19/2018  . CLL (chronic lymphocytic leukemia) ([place]) 03/19/2018  . Fall 03/04/2018  . Rash 03/04/2018  . Recurrent pneumonia 01/03/2018  . Mouth lesion 12/23/2017  . Hoarseness 12/23/2017  . Diabetes mellitus without complication ([place]) 56/43/3295  . Chronic back pain 03/05/2017  . Pain in joint involving left lower leg  08/26/2016  . GERD (gastroesophageal reflux disease) 08/05/2016  . 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 Obesity (BMI 30-39.9) 06/20/2014  . Dermatitis 02/22/2014  . COPD exacerbation ([place]) 10/12/2013  . Chronic obstructive pulmonary disease ([place]) 10/12/2013  . Bradycardia 08/31/2013  . History of smoking 08/11/2013  . Personal history of tobacco use, presenting hazards to health 08/11/2013  . Dyspnea 08/06/2013  . Dysphagia, pharyngoesophageal phase 05/05/2013  . 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 Chronic prostatitis 09/11/2012  . Elevated prostate specific antigen (PSA) 09/11/2012  . 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 ED (erectile dysfunction) of organic origin 09/11/2012  . Incomplete emptying of bladder 09/11/2012  . Nodular prostate with urinary obstruction 09/11/2012  . Testicular hypofunction 09/11/2012  . Urge incontinence 09/11/2012  . Posttraumatic stress disorder 12/23/2011  . 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 Essential hypertension 09/10/2011   Social History   Tobacco Use  . Smoking status: Former Smoker    Packs/day: 1.50    Years: 50.00    Pack years: 75.00    Types: Cigarettes    Last attempt to quit: 09/10/1999    Years since quitting: 19.3  . Smokeless tobacco: Never Used  Substance Use Topics  . Alcohol use: No    Alcohol/week: 0.0 standard drinks   Review of Systems  Constitutional: Negative for chills, fatigue and fever.  HENT: Negative for congestion, ear pain, sinus pain and  sore throat.   Eyes: Negative.   Respiratory: Negative for cough, shortness of breath and wheezing.   Cardiovascular: Negative for chest pain, palpitations and leg swelling.  Gastrointestinal: Negative for abdominal pain, diarrhea, nausea and vomiting.  Genitourinary: Negative for dysuria, frequency and urgency.  Musculoskeletal: Negative for arthralgias and myalgias.  Skin: Negative for color change, pallor and rash.  Neurological:  Negative for syncope, light-headedness and headaches. +multiple falls.  Psychiatric/Behavioral: The patient is not nervous/anxious.       Objective:   Physical Exam Vitals signs and nursing note reviewed.  Constitutional:      General: He is not in acute distress.    Appearance: He is not toxic-appearing or diaphoretic.  HENT:     Head: Normocephalic and atraumatic.     Right Ear: Tympanic membrane, ear canal and external ear normal.     Left Ear: Tympanic membrane, ear canal and external ear normal.     Nose: Nose normal.     Mouth/Throat:     Mouth: Mucous membranes are moist.  Eyes:     General: No scleral icterus.    Conjunctiva/sclera: Conjunctivae normal.     Pupils: Pupils are equal, round, and reactive to light.  Neck:     Musculoskeletal: Normal range of motion and neck supple. No neck rigidity or muscular tenderness.     Vascular: No carotid bruit.  Cardiovascular:     Rate and Rhythm: Normal rate and regular rhythm.  Pulmonary:     Effort: Pulmonary effort is normal. No respiratory distress.     Breath sounds: Normal breath sounds.  Skin:    General: Skin is warm and dry.     Capillary Refill: Capillary refill takes less than 2 seconds.     Coloration: Skin is not pale.  Neurological:     Mental Status: He is alert and oriented to person, place, and time.     Cranial Nerves: No cranial nerve deficit.     Motor: No weakness.     Coordination: Coordination normal.     Gait: Gait normal.     Comments: Smile symmetrical.  Speech clear.  Grips equal and strong.  Able to hold both arm straight out in front of him, no drift.  Able to resist me pushing down on her arms.  Gait is steady.  Negative Romberg.  Can raise eyebrows, puff out cheeks and clenched teeth without issue.  Psychiatric:        Mood and Affect: Mood normal.        Behavior: Behavior normal.        Thought Content: Thought content normal.        Judgment: Judgment normal.    Vitals:   12/29/18 1129    BP: (!) 140/56  Pulse: 82  Resp: 20  Temp: 98.5 F (36.9 C)  SpO2: 93%    Assessment & Plan:   Falling episodes, bilateral carotid artery stenosis, history of stroke - patient does appear neurologically intact in clinic right now. Due to patient's falling episodes, his heart/vascular medical history and history of stroke we will order stat MRI of brain and MRA of brain.  We will also get lab work in clinic today including CBC and CMP to look for any changes in blood counts/electrolytes.  Advised that if he does have another fall, or any other neurological symptoms including speech difficulty, facial droop, headache, extremity weakness, numbness and tingling he needs to call office right away and go to the emergency room  for immediate evaluation.  Patient verbalized understanding.   Patient aware someone will call him with MRI appointment.  He will keep already scheduled follow-up as planned in early March 2020.  Strict return precautions given and red flag symptoms reviewed that patient will be aware of that would prompt him to go to the emergency room/call 911.

## 2018-12-31 ENCOUNTER — Ambulatory Visit
Admission: RE | Admit: 2018-12-31 | Discharge: 2018-12-31 | Disposition: A | Discharge status: Home / Self Care | Payer: Medicare Other | Source: Ambulatory Visit | Attending: Family Medicine | Admitting: Family Medicine

## 2018-12-31 ENCOUNTER — Ambulatory Visit: Payer: Medicare Other

## 2018-12-31 DIAGNOSIS — I6603 Occlusion and stenosis of bilateral middle cerebral arteries: Secondary | ICD-10-CM | POA: Insufficient documentation

## 2018-12-31 DIAGNOSIS — I6602 Occlusion and stenosis of left middle cerebral artery: Secondary | ICD-10-CM | POA: Diagnosis not present

## 2018-12-31 DIAGNOSIS — R296 Repeated falls: Secondary | ICD-10-CM | POA: Diagnosis not present

## 2018-12-31 DIAGNOSIS — R29818 Other symptoms and signs involving the nervous system: Secondary | ICD-10-CM | POA: Diagnosis not present

## 2018-12-31 DIAGNOSIS — S0990XA Unspecified injury of head, initial encounter: Secondary | ICD-10-CM | POA: Diagnosis not present

## 2019-01-01 ENCOUNTER — Other Ambulatory Visit: Payer: Self-pay | Admitting: Family Medicine

## 2019-01-01 DIAGNOSIS — R296 Repeated falls: Secondary | ICD-10-CM

## 2019-01-01 DIAGNOSIS — Z8673 Personal history of transient ischemic attack (TIA), and cerebral infarction without residual deficits: Secondary | ICD-10-CM

## 2019-01-20 ENCOUNTER — Ambulatory Visit
Admission: RE | Admit: 2019-01-20 | Discharge: 2019-01-20 | Disposition: A | Discharge status: Home / Self Care | Payer: Medicare Other | Source: Ambulatory Visit | Attending: Oncology | Admitting: Oncology

## 2019-01-20 ENCOUNTER — Other Ambulatory Visit: Payer: Self-pay

## 2019-01-20 DIAGNOSIS — R591 Generalized enlarged lymph nodes: Secondary | ICD-10-CM

## 2019-01-21 ENCOUNTER — Other Ambulatory Visit: Payer: Self-pay

## 2019-01-21 ENCOUNTER — Ambulatory Visit
Admission: RE | Admit: 2019-01-21 | Discharge: 2019-01-21 | Disposition: A | Discharge status: Home / Self Care | Payer: Medicare Other | Source: Ambulatory Visit | Attending: Oncology | Admitting: Oncology

## 2019-01-21 DIAGNOSIS — C9110 Chronic lymphocytic leukemia of B-cell type not having achieved remission: Secondary | ICD-10-CM | POA: Diagnosis not present

## 2019-01-21 DIAGNOSIS — R591 Generalized enlarged lymph nodes: Secondary | ICD-10-CM | POA: Insufficient documentation

## 2019-01-21 DIAGNOSIS — R599 Enlarged lymph nodes, unspecified: Secondary | ICD-10-CM | POA: Diagnosis not present

## 2019-01-21 MED ORDER — IOHEXOL 300 MG/ML  SOLN
100.0000 mL | Freq: Once | INTRAMUSCULAR | Status: AC | PRN
  Administered 2019-01-21: 100 mL via INTRAVENOUS

## 2019-01-22 ENCOUNTER — Ambulatory Visit: Payer: Medicare HMO | Admitting: Family Medicine

## 2019-01-25 ENCOUNTER — Other Ambulatory Visit: Payer: Self-pay | Admitting: *Deleted

## 2019-01-25 ENCOUNTER — Telehealth: Payer: Self-pay | Admitting: *Deleted

## 2019-01-25 DIAGNOSIS — C9110 Chronic lymphocytic leukemia of B-cell type not having achieved remission: Secondary | ICD-10-CM

## 2019-01-25 NOTE — Progress Notes (Signed)
ct 

## 2019-01-25 NOTE — Telephone Encounter (Signed)
Left vm for pt regarding CT scan results. Orders entered for repeat CT scans and follow up in 6 months.

## 2019-01-27 NOTE — Progress Notes (Deleted)
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   INTERVAL HISTORY: Patient returns to clinic today for laboratory work, discussion of his imaging results, and routine evaluation.  He continues to feel well and remains asymptomatic.  He has no neurologic complaints.  He denies any recent fevers or illnesses.  He has a good appetite and denies weight loss.  He denies any night sweats.  He has no chest pain, cough, hemoptysis, or shortness of breath.  He denies any nausea, vomiting, constipation, or diarrhea.  He has no urinary complaints.  Patient feels at his baseline offers no specific complaints today.  REVIEW OF SYSTEMS:   Review of Systems  Constitutional: Negative.  Negative for fever, malaise/fatigue and weight loss.  Respiratory: Negative.  Negative for cough and shortness of breath.   Cardiovascular: Negative.  Negative for chest pain and leg swelling.  Gastrointestinal: Negative.  Negative for abdominal pain.  Genitourinary: Negative.  Negative for dysuria.  Musculoskeletal: Negative.  Negative for back pain.  Skin: Negative.  Negative for rash.  Neurological: Negative.  Negative for dizziness, focal weakness, weakness and headaches.  Psychiatric/Behavioral: Negative.  The patient is not nervous/anxious.     As per HPI. Otherwise, a complete review of systems is negative.  PAST MEDICAL HISTORY: Past Medical History:  Diagnosis Date   Anemia    Arthritis    Cancer ([place])    Esophageal reflux    Hyperlipidemia    Hypertension    Pneumonia    recurrent   Sebaceous cyst     PAST SURGICAL HISTORY: Past Surgical History:  Procedure Laterality Date   HEMORRHOID SURGERY       FAMILY HISTORY: Family History  Problem Relation Age of Onset   Heart disease Mother    Cancer Maternal Aunt        lung   Lung cancer Maternal Aunt    Leukemia Maternal Aunt     ADVANCED DIRECTIVES (Y/N):  N  HEALTH MAINTENANCE: Social History   Tobacco Use   Smoking status: Former Smoker    Packs/day: 1.50    Years: 50.00    Pack years: 75.00    Types: Cigarettes    Last attempt to quit: 09/10/1999    Years since quitting: 19.3   Smokeless tobacco: Never Used  Substance Use Topics   Alcohol use: No    Alcohol/week: 0.0 standard drinks   Drug use: No     Colonoscopy:  PAP:  Bone density:  Lipid panel:  Allergies  Allergen Reactions   Cephalexin Rash    Current Outpatient Medications  Medication Sig Dispense Refill   albuterol (PROVENTIL HFA;VENTOLIN HFA) 108 (90 Base) MCG/ACT inhaler Inhale 1-2 puffs into the lungs every 4 (four) hours as needed for wheezing or shortness of breath. 1 Inhaler 10   amLODipine (NORVASC) 5 MG tablet Take 1 tablet (5 mg total) by mouth 2 (two) times daily. 180 tablet 1   aspirin EC 81 MG tablet Take by mouth.     atorvastatin (LIPITOR) 10 MG tablet Take 1 tablet (10 mg total) by mouth at bedtime. 90 tablet 3   cholecalciferol (VITAMIN D) 1000 UNITS tablet Take 1,000 Units by mouth daily.     dutasteride (AVODART) 0.5 MG capsule Take 1 capsule (  0.5 mg total) by mouth daily. 90 capsule 1   Multiple Vitamins-Minerals (MEGA MULTIVITAMIN FOR MEN) TABS Take 2 capsules by mouth daily.     omeprazole (PRILOSEC) 20 MG capsule Take 1 capsule (20 mg total) by mouth daily. 30 capsule 6   sertraline (ZOLOFT) 50 MG tablet Take 1 tablet (50 mg total) by mouth daily. 30 tablet 3   tamsulosin (FLOMAX) 0.4 MG CAPS capsule Take 1 capsule (0.4 mg total) by mouth daily. 90 capsule 3   Tiotropium Bromide-Olodaterol (STIOLTO RESPIMAT) 2.5-2.5 MCG/ACT AERS Inhale 2 puffs into the lungs daily. 1 Inhaler 10   triamcinolone cream  (KENALOG) 0.1 %      valACYclovir (VALTREX) 500 MG tablet Take 1 tablet (500 mg total) by mouth every other day. 15 tablet 3   No current facility-administered medications for this visit.     OBJECTIVE: There were no vitals filed for this visit.   There is no height or weight on file to calculate BMI.    ECOG FS:0 - Asymptomatic  General: Well-developed, well-nourished, no acute distress. Eyes: Pink conjunctiva, anicteric sclera. HEENT: Normocephalic, moist mucous membranes, clear oropharnyx. Lungs: Clear to auscultation bilaterally. Heart: Regular rate and rhythm. No rubs, murmurs, or gallops. Abdomen: Soft, nontender, nondistended. No organomegaly noted, normoactive bowel sounds. Musculoskeletal: No edema, cyanosis, or clubbing. Neuro: Alert, answering all questions appropriately. Cranial nerves grossly intact. Skin: No rashes or petechiae noted. Psych: Normal affect. Lymphatics: No cervical, calvicular, axillary or inguinal LAD.  LAB RESULTS:  Lab Results  Component Value Date   NA 135 12/29/2018   K 4.6 12/29/2018   CL 104 12/29/2018   CO2 23 12/29/2018   GLUCOSE 101 (H) 12/29/2018   BUN 38 (H) 12/29/2018   CREATININE 1.64 (H) 12/29/2018   CALCIUM 9.3 12/29/2018   PROT 6.7 12/29/2018   ALBUMIN 4.1 12/29/2018   AST 20 12/29/2018   ALT 17 12/29/2018   ALKPHOS 62 12/29/2018   BILITOT 0.3 12/29/2018   GFRNONAA 46 (L) 01/04/2018   GFRAA 54 (L) 01/04/2018    Lab Results  Component Value Date   WBC 11.2 (H) 12/29/2018   NEUTROABS 4.6 07/23/2018   HGB 10.6 (L) 12/29/2018   HCT 33.0 (L) 12/29/2018   MCV 87.1 12/29/2018   PLT 208.0 12/29/2018     STUDIES: Ct Chest W Contrast  Result Date: 01/21/2019 CLINICAL DATA:  Follow-up of lymphadenopathy. Chronic lymphocytic leukemia. EXAM: CT CHEST, ABDOMEN, AND PELVIS WITH CONTRAST TECHNIQUE: Multidetector CT imaging of the chest, abdomen and pelvis was performed following the standard protocol during bolus administration  of intravenous contrast. CONTRAST:  119m OMNIPAQUE IOHEXOL 300 MG/ML  SOLN COMPARISON:  07/21/2018 FINDINGS: CT CHEST FINDINGS Cardiovascular: Aortic and branch vessel atherosclerosis. Normal heart size, without pericardial effusion. Multivessel coronary artery atherosclerosis. No central pulmonary embolism, on this non-dedicated study. Mediastinum/Nodes: Right paratracheal node of 1.4 cm measured 1.9 cm on the prior. Subcarinal node of 1.9 cm measured 2.1 cm on the prior. Index left axillary node measures 1.0 cm today versus 1.4 cm on the prior. Contrast level within the esophagus on image 23/2. Lungs/Pleura: Perifissural right upper lobe pulmonary nodules x2 are unchanged at maximally 3 mm. Clustered left upper lobe pulmonary nodules of maximally 5 mm in the left upper lobe may be slightly more distinct today, including on image 39/3. A subpleural 4 mm left lower lobe pulmonary nodule is unchanged on image 107/3. The 1.6 cm left lower lobe nodule and the posterior right upper lobe 5 mm sub solid  nodule have resolved. Mild centrilobular emphysema. Musculoskeletal: No acute osseous abnormality. CT ABDOMEN PELVIS FINDINGS Hepatobiliary: Normal liver. Normal gallbladder, without biliary ductal dilatation. Pancreas: Fatty atrophy involving the majority of the pancreas. No duct dilatation. No acute inflammation. Spleen: Normal in size, without focal abnormality. Adrenals/Urinary Tract: Normal adrenal glands. Mild renal cortical thinning bilaterally. No hydronephrosis. Small bladder saccules. Stomach/Bowel: Normal stomach, without wall thickening. Extensive colonic diverticulosis. Normal terminal ileum and appendix. Normal small bowel. Vascular/Lymphatic: Advanced aortic and branch vessel atherosclerosis. Index aortocaval node measures 9 mm on image 78/2 versus 12 mm on the prior. Index jejunal mesenteric node measures 8 mm on image 78/2 versus 11 mm on the prior. More anterior small bowel mesenteric node measures 1.1  cm today on image 74/2. Compare 1.2 cm on the prior. Index left external iliac node measures 1.2 cm on image 118/2 versus 1.3 cm on the prior. Reproductive: Mild prostatomegaly. Other: No significant free fluid. Musculoskeletal: Degenerate changes of both hips. Thoracolumbar spondylosis. IMPRESSION: 1. Mild response to therapy of adenopathy within the chest, abdomen and pelvis. 2. The left lower and right upper lobe pulmonary opacities described on the prior exam have resolved. There are scattered bilateral pulmonary nodules which are primarily similar. A left upper lobe area of clustered nodularity may be slightly increased, warranting follow-up attention. 3. Aortic atherosclerosis (ICD10-I70.0), coronary artery atherosclerosis and emphysema (ICD10-J43.9). 4. Esophageal air fluid level suggests dysmotility or gastroesophageal reflux. 5. Prostatomegaly with suspicion of bladder outlet obstruction. Electronically Signed   By: Abigail Miyamoto M.D.   On: 01/21/2019 15:31   Mr Angiogram Head Wo Contrast  Result Date: 12/31/2018 CLINICAL DATA:  [place], struck head five days ago. Gait imbalance. History of hypertension and hyperlipidemia. EXAM: MRI HEAD WITHOUT CONTRAST MRA HEAD WITHOUT CONTRAST TECHNIQUE: Multiplanar, multiecho pulse sequences of the brain and surrounding structures were obtained without intravenous contrast. Angiographic images of the head were obtained using MRA technique without contrast. COMPARISON:  MRI head Mar 12, 2018 FINDINGS: MRI HEAD FINDINGS INTRACRANIAL CONTENTS: No reduced diffusion to suggest acute ischemia. No susceptibility artifact to suggest hemorrhage. Progressed moderate parenchymal brain volume loss. No hydrocephalus. 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 RIGHT basal ganglia mineralization without susceptibility artifact. A few scattered subcentimeter supratentorial white matter FLAIR T2 hyperintensities compatible with chronic small vessel  ischemic changes, less than expected for age. No suspicious parenchymal signal, masses, mass effect. No abnormal extra-axial fluid collections. No extra-axial masses. VASCULAR: Normal major intracranial vascular flow voids present at skull base. SKULL AND UPPER CERVICAL SPINE: No abnormal sellar expansion. No suspicious calvarial bone marrow signal. Craniocervical junction maintained. SINUSES/ORBITS: Mild paranasal sinus mucosal thickening. Minimal mastoid effusion.The included ocular globes and orbital contents are non-suspicious. Status post bilateral ocular lens implants. OTHER: Patient is edentulous. MRA HEAD FINDINGS ANTERIOR CIRCULATION: Normal flow related enhancement of the included cervical, petrous, cavernous and supraclinoid internal carotid arteries. Patent anterior communicating artery. Patent anterior and middle cerebral arteries. Mild stenosis bilateral M1 segment. No large vessel occlusion, flow limiting stenosis, aneurysm. POSTERIOR CIRCULATION: LEFT vertebral artery is dominant. Vertebrobasilar arteries are patent, with normal flow related enhancement of the main branch vessels. Patent posterior cerebral arteries. RIGHT posterior communicating artery present. No large vessel occlusion, flow limiting stenosis,  aneurysm. ANATOMIC VARIANTS: Hypoplastic RIGHT P 1 segment. Source images and MIP images were reviewed. IMPRESSION: MRI HEAD: 1. No acute intracranial process. 2. Progressed moderate parenchymal brain volume loss. 3. 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 MRA HEAD: 1. No emergent  large vessel occlusion or flow-limiting stenosis. 2. Mild stenosis bilateral M1 segments. Electronically Signed   By: Elon Alas M.D.   On: 12/31/2018 19:30   Mr Brain Wo Contrast  Result Date: 12/31/2018 CLINICAL DATA:  [place], struck head five days ago. Gait imbalance. History of hypertension and hyperlipidemia. EXAM: MRI HEAD WITHOUT CONTRAST MRA HEAD WITHOUT CONTRAST TECHNIQUE: Multiplanar, multiecho pulse  sequences of the brain and surrounding structures were obtained without intravenous contrast. Angiographic images of the head were obtained using MRA technique without contrast. COMPARISON:  MRI head Mar 12, 2018 FINDINGS: MRI HEAD FINDINGS INTRACRANIAL CONTENTS: No reduced diffusion to suggest acute ischemia. No susceptibility artifact to suggest hemorrhage. Progressed moderate parenchymal brain volume loss. No hydrocephalus. 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 RIGHT basal ganglia mineralization without susceptibility artifact. A few scattered subcentimeter supratentorial white matter FLAIR T2 hyperintensities compatible with chronic small vessel ischemic changes, less than expected for age. No suspicious parenchymal signal, masses, mass effect. No abnormal extra-axial fluid collections. No extra-axial masses. VASCULAR: Normal major intracranial vascular flow voids present at skull base. SKULL AND UPPER CERVICAL SPINE: No abnormal sellar expansion. No suspicious calvarial bone marrow signal. Craniocervical junction maintained. SINUSES/ORBITS: Mild paranasal sinus mucosal thickening. Minimal mastoid effusion.The included ocular globes and orbital contents are non-suspicious. Status post bilateral ocular lens implants. OTHER: Patient is edentulous. MRA HEAD FINDINGS ANTERIOR CIRCULATION: Normal flow related enhancement of the included cervical, petrous, cavernous and supraclinoid internal carotid arteries. Patent anterior communicating artery. Patent anterior and middle cerebral arteries. Mild stenosis bilateral M1 segment. No large vessel occlusion, flow limiting stenosis, aneurysm. POSTERIOR CIRCULATION: LEFT vertebral artery is dominant. Vertebrobasilar arteries are patent, with normal flow related enhancement of the main branch vessels. Patent posterior cerebral arteries. RIGHT posterior communicating artery present. No large vessel occlusion, flow limiting stenosis,   aneurysm. ANATOMIC VARIANTS: Hypoplastic RIGHT P 1 segment. Source images and MIP images were reviewed. IMPRESSION: MRI HEAD: 1. No acute intracranial process. 2. Progressed moderate parenchymal brain volume loss. 3. 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 MRA HEAD: 1. No emergent large vessel occlusion or flow-limiting stenosis. 2. Mild stenosis bilateral M1 segments. Electronically Signed   By: Elon Alas M.D.   On: 12/31/2018 19:30   Ct Abdomen Pelvis W Contrast  Result Date: 01/21/2019 CLINICAL DATA:  Follow-up of lymphadenopathy. Chronic lymphocytic leukemia. EXAM: CT CHEST, ABDOMEN, AND PELVIS WITH CONTRAST TECHNIQUE: Multidetector CT imaging of the chest, abdomen and pelvis was performed following the standard protocol during bolus administration of intravenous contrast. CONTRAST:  120m OMNIPAQUE IOHEXOL 300 MG/ML  SOLN COMPARISON:  07/21/2018 FINDINGS: CT CHEST FINDINGS Cardiovascular: Aortic and branch vessel atherosclerosis. Normal heart size, without pericardial effusion. Multivessel coronary artery atherosclerosis. No central pulmonary embolism, on this non-dedicated study. Mediastinum/Nodes: Right paratracheal node of 1.4 cm measured 1.9 cm on the prior. Subcarinal node of 1.9 cm measured 2.1 cm on the prior. Index left axillary node measures 1.0 cm today versus 1.4 cm on the prior. Contrast level within the esophagus on image 23/2. Lungs/Pleura: Perifissural right upper lobe pulmonary nodules x2 are unchanged at maximally 3 mm. Clustered left upper lobe pulmonary nodules of maximally 5 mm in the left upper lobe may be slightly more distinct today, including on image 39/3. A subpleural 4 mm left lower lobe pulmonary nodule is unchanged on image 107/3. The 1.6 cm left lower lobe nodule and the posterior right upper lobe 5 mm sub solid nodule have resolved. Mild centrilobular emphysema. Musculoskeletal: No acute  osseous abnormality. CT ABDOMEN PELVIS FINDINGS Hepatobiliary: Normal liver. Normal  gallbladder, without biliary ductal dilatation. Pancreas: Fatty atrophy involving the majority of the pancreas. No duct dilatation. No acute inflammation. Spleen: Normal in size, without focal abnormality. Adrenals/Urinary Tract: Normal adrenal glands. Mild renal cortical thinning bilaterally. No hydronephrosis. Small bladder saccules. Stomach/Bowel: Normal stomach, without wall thickening. Extensive colonic diverticulosis. Normal terminal ileum and appendix. Normal small bowel. Vascular/Lymphatic: Advanced aortic and branch vessel atherosclerosis. Index aortocaval node measures 9 mm on image 78/2 versus 12 mm on the prior. Index jejunal mesenteric node measures 8 mm on image 78/2 versus 11 mm on the prior. More anterior small bowel mesenteric node measures 1.1 cm today on image 74/2. Compare 1.2 cm on the prior. Index left external iliac node measures 1.2 cm on image 118/2 versus 1.3 cm on the prior. Reproductive: Mild prostatomegaly. Other: No significant free fluid. Musculoskeletal: Degenerate changes of both hips. Thoracolumbar spondylosis. IMPRESSION: 1. Mild response to therapy of adenopathy within the chest, abdomen and pelvis. 2. The left lower and right upper lobe pulmonary opacities described on the prior exam have resolved. There are scattered bilateral pulmonary nodules which are primarily similar. A left upper lobe area of clustered nodularity may be slightly increased, warranting follow-up attention. 3. Aortic atherosclerosis (ICD10-I70.0), coronary artery atherosclerosis and emphysema (ICD10-J43.9). 4. Esophageal air fluid level suggests dysmotility or gastroesophageal reflux. 5. Prostatomegaly with suspicion of bladder outlet obstruction. Electronically Signed   By: Abigail Miyamoto M.D.   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  CT scan results from July 21, 2018 reviewed independently and reported as above with relatively stable adenopathy compatible with an indolent lymphoproliferative disorder.  Previously, his flow cytometry panel reported a CD5+ and CD23+ clonal B-cell population, highly suspicious for CLL/SLL.  Patient does not require biopsy at this point, but will consider one in the future if he has enlarging lymph nodes or he becomes symptomatic.  No intervention is needed.  Patient does not require bone marrow biopsy.  Return to clinic in 6 months with repeat imaging and further evaluation.  I spent a total of 30 minutes face-to-face with the patient of which greater than 50% of the visit was spent in counseling and coordination of care as detailed above.    Patient expressed understanding and was in agreement with this plan. He also understands that He can call clinic at any time with any questions, concerns, or complaints.   Cancer Staging No matching staging information was found for the patient.  Lloyd Huger, MD   01/27/2019 2:27 PM

## 2019-01-28 ENCOUNTER — Inpatient Hospital Stay: Payer: Medicare Other

## 2019-01-28 ENCOUNTER — Inpatient Hospital Stay: Payer: Medicare Other | Admitting: Oncology

## 2019-01-29 ENCOUNTER — Ambulatory Visit (INDEPENDENT_AMBULATORY_CARE_PROVIDER_SITE_OTHER): Payer: Medicare Other | Admitting: Family Medicine

## 2019-01-29 ENCOUNTER — Other Ambulatory Visit: Payer: Self-pay

## 2019-01-29 ENCOUNTER — Encounter: Payer: Self-pay | Admitting: Family Medicine

## 2019-01-29 VITALS — BP 132/56 | HR 64 | Temp 98.2°F | Resp 16 | Ht 68.0 in | Wt 219.0 lb

## 2019-01-29 DIAGNOSIS — J029 Acute pharyngitis, unspecified: Secondary | ICD-10-CM

## 2019-01-29 DIAGNOSIS — R0982 Postnasal drip: Secondary | ICD-10-CM | POA: Diagnosis not present

## 2019-01-29 LAB — POC INFLUENZA A&B (BINAX/QUICKVUE)
Influenza A, POC: NEGATIVE
Influenza B, POC: NEGATIVE

## 2019-01-29 LAB — POCT RAPID STREP A (OFFICE): Rapid Strep A Screen: NEGATIVE

## 2019-01-29 NOTE — Progress Notes (Signed)
Subjective:    Patient ID: Donald [name], male    DOB: [birth year], 85 y.o.   MRN: [id number]  HPI  Presents to clinic c/o sore throat and believes he saw red/white spots in back of throat. Sore throat been present for 1 day.   Denies fever or chills. No nausea, vomiting or diarrhea. No SOB or cough. No travel recently, has only been in [place], [place] area.  No contact with anyone under investigation for or who has tested positive for novel coronavirus. He has not been around any one with strep throat, has not been around any young children.  Has used chloraseptic spray with decent effect.   Patient Active Problem List   Diagnosis Date Noted  . History of stroke 12/29/2018  . Falling episodes 12/29/2018  . Bilateral carotid artery stenosis 04/01/2018  . Stroke (cerebrum) ([place]) 03/19/2018  . Leukocytosis 03/19/2018  . CLL (chronic lymphocytic leukemia) ([place]) 03/19/2018  . Fall 03/04/2018  . Rash 03/04/2018  . Recurrent pneumonia 01/03/2018  . Mouth lesion 12/23/2017  . Hoarseness 12/23/2017  . Diabetes mellitus without complication ([place]) 83/41/9622  . Chronic back pain 03/05/2017  . Pain in joint involving left lower leg 08/26/2016  . GERD (gastroesophageal reflux disease) 08/05/2016  . 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 Obesity (BMI 30-39.9) 06/20/2014  . Dermatitis 02/22/2014  . COPD exacerbation ([place]) 10/12/2013  . Chronic obstructive pulmonary disease ([place]) 10/12/2013  . Bradycardia 08/31/2013  . History of smoking 08/11/2013  . Personal history of tobacco use, presenting hazards to health 08/11/2013  . Dyspnea 08/06/2013  . Dysphagia, pharyngoesophageal phase 05/05/2013  . 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 Chronic prostatitis 09/11/2012  . Elevated prostate specific antigen (PSA) 09/11/2012  . 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 ED (erectile dysfunction) of organic origin 09/11/2012  . Incomplete emptying of bladder 09/11/2012  . Nodular prostate with  urinary obstruction 09/11/2012  . Testicular hypofunction 09/11/2012  . Urge incontinence 09/11/2012  . Posttraumatic stress disorder 12/23/2011  . 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 Essential hypertension 09/10/2011   Social History   Tobacco Use  . Smoking status: Former Smoker    Packs/day: 1.50    Years: 50.00    Pack years: 75.00    Types: Cigarettes    Last attempt to quit: 09/10/1999    Years since quitting: 19.4  . Smokeless tobacco: Never Used  Substance Use Topics  . Alcohol use: No    Alcohol/week: 0.0 standard drinks   Review of Systems  Constitutional: Negative for chills, fatigue and fever.  HENT: Negative for congestion, ear pain, sinus pain. +sore throat.   Eyes: Negative.   Respiratory: Negative for cough, shortness of breath and wheezing.   Cardiovascular: Negative for chest pain, palpitations and leg swelling.  Gastrointestinal: Negative for abdominal pain, diarrhea, nausea and vomiting.  Genitourinary: Negative for dysuria, frequency and urgency.  Musculoskeletal: Negative for arthralgias and myalgias.  Skin: Negative for color change, pallor and rash.  Neurological: Negative for syncope, light-headedness and headaches.  Psychiatric/Behavioral: The patient is not nervous/anxious.       Objective:   Physical Exam Vitals signs and nursing note reviewed.  Constitutional:      General: He is not in acute distress.    Appearance: He is not ill-appearing or toxic-appearing.  HENT:     Head: Normocephalic and atraumatic.     Nose: Congestion and rhinorrhea present.     Comments: Mild post nasal drip    Mouth/Throat:  Mouth: Mucous membranes are moist. No oral lesions.     Pharynx: Uvula midline. No pharyngeal swelling, oropharyngeal exudate, posterior oropharyngeal erythema or uvula swelling.     Tonsils: No tonsillar exudate or tonsillar abscesses.  Eyes:     General: No scleral icterus.    Extraocular Movements:  Extraocular movements intact.     Conjunctiva/sclera: Conjunctivae normal.     Pupils: Pupils are equal, round, and reactive to light.  Neck:     Musculoskeletal: Neck supple. No neck rigidity.  Cardiovascular:     Rate and Rhythm: Normal rate and regular rhythm.     Heart sounds: Normal heart sounds.  Pulmonary:     Effort: Pulmonary effort is normal. No respiratory distress.     Breath sounds: Normal breath sounds. No wheezing, rhonchi or rales.  Lymphadenopathy:     Cervical: No cervical adenopathy.  Skin:    General: Skin is warm and dry.     Coloration: Skin is not jaundiced or pale.  Neurological:     Mental Status: He is alert and oriented to person, place, and time.  Psychiatric:        Mood and Affect: Mood normal.        Behavior: Behavior normal.    Vitals:   01/29/19 0952  BP: (!) 132/56  Pulse: 64  Resp: 16  Temp: 98.2 F (36.8 C)  SpO2: 96%      Assessment & Plan:   Viral pharyngitis/postnasal drip - suspect patient's sore throat is related to either a viral pharyngitis or postnasal drainage.  Advised to continue using Chloraseptic spray as he has been at home, do salt water gargles, rest and drink plenty of fluids.  Also advised he can do some daily cough drops to help soothe the throat.  Point-of-care flu and strep swabs are negative.  Patient does not meet criteria for coronavirus testing.  Strongly recommended to patient that he remain home as much as possible over the next few weeks to avoid exposure to any illnesses, and also strongly recommended he do good handwashing and keep up good fluid intake.  Patient verbalized understanding.   Patient advised that if his symptoms worsen, he develops high fever, shortness of breath or cough to call office and or go to emergency room right away for evaluation.  Patient will otherwise keep regularly scheduled appointment as planned.  He will return to clinic if needed sooner.

## 2019-01-29 NOTE — Patient Instructions (Signed)
Pharyngitis  Pharyngitis is a sore throat (pharynx). This is when there is redness, pain, and swelling in your throat. Most of the time, this condition gets better on its own. In some cases, you may need medicine. Follow these instructions at home:  Take over-the-counter and prescription medicines only as told by your doctor. ? If you were prescribed an antibiotic medicine, take it as told by your doctor. Do not stop taking the antibiotic even if you start to feel better. ? Do not give children aspirin. Aspirin has been linked to Reye syndrome.  Drink enough water and fluids to keep your pee (urine) clear or pale yellow.  Get a lot of rest.  Rinse your mouth (gargle) with a salt-water mixture 3-4 times a day or as needed. To make a salt-water mixture, completely dissolve -1 tsp of salt in 1 cup of warm water.  If your doctor approves, you may use throat lozenges or sprays to soothe your throat. Contact a doctor if:  You have large, tender lumps in your neck.  You have a rash.  You cough up green, yellow-brown, or bloody spit. Get help right away if:  You have a stiff neck.  You drool or cannot swallow liquids.  You cannot drink or take medicines without throwing up.  You have very bad pain that does not go away with medicine.  You have problems breathing, and it is not from a stuffy nose.  You have new pain and swelling in your knees, ankles, wrists, or elbows. Summary  Pharyngitis is a sore throat (pharynx). This is when there is redness, pain, and swelling in your throat.  If you were prescribed an antibiotic medicine, take it as told by your doctor. Do not stop taking the antibiotic even if you start to feel better.  Most of the time, pharyngitis gets better on its own. Sometimes, you may need medicine. This information is not intended to replace advice given to you by your health care provider. Make sure you discuss any questions you have with your health care  provider. Document Released: 04/15/2008 Document Revised: 12/03/2016 Document Reviewed: 12/03/2016 Elsevier Interactive Patient Education  2019 [employer]  

## 2019-02-04 ENCOUNTER — Inpatient Hospital Stay: Payer: Medicare Other | Attending: Oncology | Admitting: Oncology

## 2019-02-04 ENCOUNTER — Encounter: Payer: Self-pay | Admitting: Oncology

## 2019-02-04 DIAGNOSIS — C9110 Chronic lymphocytic leukemia of B-cell type not having achieved remission: Secondary | ICD-10-CM

## 2019-02-04 NOTE — Progress Notes (Signed)
Virtual Visit via Telephone Note  I connected with Jaquail E Mensing on 02/04/19 at 11:00 AM EDT by telephone and verified that I am speaking with the correct person using two identifiers.   I discussed the limitations, risks, security and privacy concerns of performing an evaluation and management service by telephone and the availability of in person appointments. I also discussed with the patient that there may be a patient responsible charge related to this service. The patient expressed understanding and agreed to proceed.   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   Patient previously agreed to do visit by telephone for assessment and discuss his imaging and laboratory results.  He continues to feel well and remains asymptomatic.  He denies any fevers, night sweats, or unintentional weight loss.  He has no neurologic complaints. He has no chest pain, cough, hemoptysis, or shortness of breath.  He denies any nausea, vomiting, constipation, or diarrhea.  He has no urinary complaints.    Patient feels at his baseline offers no specific complaints today.   Observations/Objective:  Patient is asymptomatic.  CBC on December 13, 2018 revealed WBC 11.2, hemoglobin 10.6, platelet 208. MetC on December 13, 2018 revealed a creatinine of 1.64 which is approximately his baseline.  CT scan of the chest, abdomen and pelvis on January 21, 2019:  1. Mild response to therapy of adenopathy within the chest, abdomen and pelvis. 2. The left lower and right upper lobe pulmonary opacities described on the prior exam have resolved. There are scattered bilateral pulmonary nodules which are primarily similar. A left upper lobe area of clustered nodularity may be slightly increased, warranting follow-up attention. 3. Aortic atherosclerosis (ICD10-I70.0), coronary artery atherosclerosis and emphysema (ICD10-J43.9). 4. Esophageal air fluid level suggests dysmotility or gastroesophageal  reflux. 5. Prostatomegaly with suspicion of bladder outlet obstruction.   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  CT scan results from  January 21, 2019 reviewed independently with essentially stable adenopathy consistent with an indolent lymphoproliferative disorder.  Previously, his flow cytometry panel reported a CD5+ and CD23+ clonal B-cell population, highly suspicious for CLL/SLL.  Patient does not require biopsy at this point, but will consider one in the future if he has enlarging lymph nodes or he becomes symptomatic.  No intervention is needed.  Patient does not require bone marrow biopsy.  Return to clinic in 6 months with repeat laboratory work, repeat imaging, and further evaluation.  Can consider changing patient to yearly imaging at that point.  Follow Up Instructions:  6 months: CT chest, abdomen, pelvis with contrast, laboratory work.  See MD 1 to 2 days later.    I discussed the assessment and treatment plan with the patient. The patient was provided an opportunity to ask questions and all were answered. The patient agreed with the plan and demonstrated an understanding of the instructions.   The patient was advised to call back or seek an in-person evaluation if the symptoms worsen or if the condition fails to improve as anticipated.  I provided 15 minutes of non-face-to-face time during this encounter.   Timothy J Finnegan, MD   

## 2019-02-04 NOTE — Progress Notes (Signed)
Called patient for a Tele-Visit.  Patient states no new concerns today.

## 2019-02-16 ENCOUNTER — Telehealth: Payer: Self-pay

## 2019-02-16 NOTE — Telephone Encounter (Signed)
Pt's wife called in requesting an appt w/ Dr. Aundra Dubin.  Spoke w/ pt's wife while pt in room w/ wife.  Pt's wife stated that pt is sleeping all the time and has a cough. Pt's wife says that cough is non-productive.  Wife stated that pt has some shortness of breath when he gets up from his chair.  Pt's wife says that pt has a little swelling around his left ankle, chest congestion and whistling coming from his nose.  No fever, no wheezing, no chills.  Pt's wife says that pt has had pneumonia in the past.  Consulted w/ Dr. Aundra Dubin who doesn't feel this type of visit should be done over the phone and recommended pt go to Urgent Care for a face-to-face evaluation since this office currently is not doing in person office visits due to COVID-19.  Advised by Dr. Aundra Dubin for both pt and wife to wear masks to Urgent Care.    Gave recommendation to pt's wife.  Pt's wife said that pt will not go to Urgent Care.   Pt's PCP is out of the office today.  Will forward to PCP's CMA.

## 2019-02-16 NOTE — Telephone Encounter (Signed)
Sent to PCP you are full tomorrow do you want to fit patient into you schedule or do a virtual visit?

## 2019-02-16 NOTE — Telephone Encounter (Signed)
We can not see him in the office with his current symptoms due to COVID-19. I agree with Dr McLean-Scocuzza's recommendations for the patient to be seen at an urgent care. If he again refuses to be evaluated at urgent care then I could do a virtual visit with him at 11:15 am.

## 2019-02-16 NOTE — Telephone Encounter (Signed)
Sent to [facility]

## 2019-02-17 ENCOUNTER — Other Ambulatory Visit
Admission: RE | Admit: 2019-02-17 | Discharge: 2019-02-17 | Disposition: A | Discharge status: Home / Self Care | Payer: Medicare Other | Source: Ambulatory Visit | Attending: *Deleted | Admitting: *Deleted

## 2019-02-17 ENCOUNTER — Other Ambulatory Visit: Payer: Self-pay

## 2019-02-17 ENCOUNTER — Encounter: Payer: Self-pay | Admitting: Emergency Medicine

## 2019-02-17 ENCOUNTER — Emergency Department: Payer: Medicare Other

## 2019-02-17 ENCOUNTER — Emergency Department
Admission: EM | Admit: 2019-02-17 | Discharge: 2019-02-17 | Disposition: A | Discharge status: Home / Self Care | Payer: Medicare Other | Attending: Emergency Medicine | Admitting: Emergency Medicine

## 2019-02-17 DIAGNOSIS — Z7982 Long term (current) use of aspirin: Secondary | ICD-10-CM | POA: Diagnosis not present

## 2019-02-17 DIAGNOSIS — N289 Disorder of kidney and ureter, unspecified: Secondary | ICD-10-CM | POA: Diagnosis not present

## 2019-02-17 DIAGNOSIS — J449 Chronic obstructive pulmonary disease, unspecified: Secondary | ICD-10-CM | POA: Diagnosis not present

## 2019-02-17 DIAGNOSIS — Z87891 Personal history of nicotine dependence: Secondary | ICD-10-CM | POA: Insufficient documentation

## 2019-02-17 DIAGNOSIS — R2242 Localized swelling, mass and lump, left lower limb: Secondary | ICD-10-CM | POA: Diagnosis not present

## 2019-02-17 DIAGNOSIS — Z79899 Other long term (current) drug therapy: Secondary | ICD-10-CM | POA: Diagnosis not present

## 2019-02-17 DIAGNOSIS — R05 Cough: Secondary | ICD-10-CM | POA: Diagnosis not present

## 2019-02-17 DIAGNOSIS — N2889 Other specified disorders of kidney and ureter: Secondary | ICD-10-CM | POA: Diagnosis not present

## 2019-02-17 DIAGNOSIS — E119 Type 2 diabetes mellitus without complications: Secondary | ICD-10-CM | POA: Diagnosis not present

## 2019-02-17 DIAGNOSIS — I10 Essential (primary) hypertension: Secondary | ICD-10-CM | POA: Diagnosis not present

## 2019-02-17 DIAGNOSIS — R5383 Other fatigue: Secondary | ICD-10-CM | POA: Diagnosis not present

## 2019-02-17 DIAGNOSIS — R0602 Shortness of breath: Secondary | ICD-10-CM | POA: Diagnosis not present

## 2019-02-17 LAB — CBC WITH DIFFERENTIAL/PLATELET
Abs Immature Granulocytes: 0.06 10*3/uL (ref 0.00–0.07)
Basophils Absolute: 0.1 10*3/uL (ref 0.0–0.1)
Basophils Relative: 1 %
Eosinophils Absolute: 0.3 10*3/uL (ref 0.0–0.5)
Eosinophils Relative: 2 %
HCT: 31.6 % — ABNORMAL LOW (ref 39.0–52.0)
Hemoglobin: 9.8 g/dL — ABNORMAL LOW (ref 13.0–17.0)
Immature Granulocytes: 0 %
Lymphocytes Relative: 23 %
Lymphs Abs: 3.3 10*3/uL (ref 0.7–4.0)
MCH: 27.8 pg (ref 26.0–34.0)
MCHC: 31 g/dL (ref 30.0–36.0)
MCV: 89.8 fL (ref 80.0–100.0)
Monocytes Absolute: 1.3 10*3/uL — ABNORMAL HIGH (ref 0.1–1.0)
Monocytes Relative: 9 %
Neutro Abs: 9.4 10*3/uL — ABNORMAL HIGH (ref 1.7–7.7)
Neutrophils Relative %: 65 %
Platelets: 186 10*3/uL (ref 150–400)
RBC: 3.52 MIL/uL — ABNORMAL LOW (ref 4.22–5.81)
RDW: 15.7 % — ABNORMAL HIGH (ref 11.5–15.5)
WBC: 14.5 10*3/uL — ABNORMAL HIGH (ref 4.0–10.5)
nRBC: 0 % (ref 0.0–0.2)

## 2019-02-17 LAB — BASIC METABOLIC PANEL
Anion gap: 9 (ref 5–15)
BUN: 48 mg/dL — ABNORMAL HIGH (ref 8–23)
CO2: 20 mmol/L — ABNORMAL LOW (ref 22–32)
Calcium: 9.2 mg/dL (ref 8.9–10.3)
Chloride: 107 mmol/L (ref 98–111)
Creatinine, Ser: 2.46 mg/dL — ABNORMAL HIGH (ref 0.61–1.24)
GFR calc Af Amer: 27 mL/min — ABNORMAL LOW (ref >60)
GFR calc non Af Amer: 23 mL/min — ABNORMAL LOW (ref >60)
Glucose, Bld: 107 mg/dL — ABNORMAL HIGH (ref 70–99)
Potassium: 5.2 mmol/L — ABNORMAL HIGH (ref 3.5–5.1)
Sodium: 136 mmol/L (ref 135–145)

## 2019-02-17 LAB — BLOOD GAS, VENOUS
Acid-base deficit: 3.9 mmol/L — ABNORMAL HIGH (ref 0.0–2.0)
Bicarbonate: 22.2 mmol/L (ref 20.0–28.0)
O2 Saturation: 82.4 %
Patient temperature: 37
pCO2, Ven: 43 mmHg — ABNORMAL LOW (ref 44.0–60.0)
pH, Ven: 7.32 (ref 7.250–7.430)
pO2, Ven: 51 mmHg — ABNORMAL HIGH (ref 32.0–45.0)

## 2019-02-17 LAB — FIBRIN DERIVATIVES D-DIMER (ARMC ONLY): Fibrin derivatives D-dimer (ARMC): 1023.57 ng/mL (FEU) — ABNORMAL HIGH (ref 0.00–499.00)

## 2019-02-17 LAB — URINALYSIS, COMPLETE (UACMP) WITH MICROSCOPIC
Bacteria, UA: NONE SEEN
Bilirubin Urine: NEGATIVE
Glucose, UA: NEGATIVE mg/dL
Hgb urine dipstick: NEGATIVE
Ketones, ur: NEGATIVE mg/dL
Nitrite: NEGATIVE
Protein, ur: NEGATIVE mg/dL
Specific Gravity, Urine: 1.016 (ref 1.005–1.030)
Squamous Epithelial / HPF: NONE SEEN (ref 0–5)
pH: 5 (ref 5.0–8.0)

## 2019-02-17 LAB — PROTIME-INR
INR: 1.1 (ref 0.8–1.2)
Prothrombin Time: 14.1 seconds (ref 11.4–15.2)

## 2019-02-17 LAB — TROPONIN I: Troponin I: <0.03 ng/mL (ref <0.03)

## 2019-02-17 LAB — BRAIN NATRIURETIC PEPTIDE: B Natriuretic Peptide: 57 pg/mL (ref 0.0–100.0)

## 2019-02-17 MED ORDER — PREDNISONE 20 MG PO TABS: 60.0000 mg | ORAL_TABLET | Freq: Every day | ORAL | 0 refills | Status: AC

## 2019-02-17 MED ORDER — SODIUM CHLORIDE 0.9 % IV BOLUS
1000.0000 mL | Freq: Once | INTRAVENOUS | Status: AC
  Administered 2019-02-17: 1000 mL via INTRAVENOUS

## 2019-02-17 NOTE — Discharge Instructions (Signed)
Take the prednisone as prescribed to help with your COPD and shortness of breath.  Continue to use albuterol by inhaler or nebulizer every 4-6 hours.  Your blood work is showing worsening kidney function than normal.  Make sure to drink plenty of fluids.  It is very important that you follow-up with your doctor as soon as possible to have this monitored and rechecked, as it can be EXTREMELY DANGEROUS with your kidney function gets worse, and can even cause death.    If you change your mind at any time and wish to return to the hospital for admission and further work-up you may do so.  You should also return if you have any worsening weakness, shortness of breath, vomiting, fever, or any other new or worsening symptoms that concern you.

## 2019-02-17 NOTE — ED Notes (Signed)
Electronic signature pad not working in room.  Pt expresses verbal understanding of discharge instructions without any further questions at this time.

## 2019-02-17 NOTE — ED Provider Notes (Signed)
[facility] Emergency Department Provider Note ____________________________________________   First MD Initiated Contact with Patient 02/17/19 1138     (approximate)  I have reviewed the triage vital signs and the nursing notes.   HISTORY  Chief Complaint Shortness of Breath    HPI Donald [name] is a 85 y.o. male with PMH as noted below who presents with shortness of breath, gradual onset over the last month, worsening course, associated with nonproductive cough, as well as with generalized fatigue.  The patient states that he feels like he needs to sleep up to 18 hours/day because of this fatigue.  He denies fever or chills, or vomiting or diarrhea.  He also reports some bilateral leg swelling over the last few weeks, worse on the left.  He went to the urgent care today and was referred to the ED.   Past Medical History:  Diagnosis Date  . Anemia   . Arthritis   . Cancer ([place])   . Esophageal reflux   . Hyperlipidemia   . Hypertension   . Pneumonia    recurrent  . Sebaceous cyst     Patient Active Problem List   Diagnosis Date Noted  . History of stroke 12/29/2018  . Falling episodes 12/29/2018  . Bilateral carotid artery stenosis 04/01/2018  . Stroke (cerebrum) ([place]) 03/19/2018  . Leukocytosis 03/19/2018  . CLL (chronic lymphocytic leukemia) ([place]) 03/19/2018  . Fall 03/04/2018  . Rash 03/04/2018  . Recurrent pneumonia 01/03/2018  . Mouth lesion 12/23/2017  . Hoarseness 12/23/2017  . Diabetes mellitus without complication ([place]) 59/29/2446  . Chronic back pain 03/05/2017  . Pain in joint involving left lower leg 08/26/2016  . GERD (gastroesophageal reflux disease) 08/05/2016  . 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 Obesity (BMI 30-39.9) 06/20/2014  . Dermatitis 02/22/2014  . COPD exacerbation ([place]) 10/12/2013  . Chronic obstructive pulmonary disease ([place]) 10/12/2013  . Bradycardia 08/31/2013  . History of smoking 08/11/2013  . Personal  history of tobacco use, presenting hazards to health 08/11/2013  . Dyspnea 08/06/2013  . Dysphagia, pharyngoesophageal phase 05/05/2013  . 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 Chronic prostatitis 09/11/2012  . Elevated prostate specific antigen (PSA) 09/11/2012  . 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 ED (erectile dysfunction) of organic origin 09/11/2012  . Incomplete emptying of bladder 09/11/2012  . Nodular prostate with urinary obstruction 09/11/2012  . Testicular hypofunction 09/11/2012  . Urge incontinence 09/11/2012  . Posttraumatic stress disorder 12/23/2011  . 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 Essential hypertension 09/10/2011    Past Surgical History:  Procedure Laterality Date  . HEMORRHOID SURGERY      Prior to Admission medications   Medication Sig Start Date End Date Taking? Authorizing Provider  albuterol (PROVENTIL HFA;VENTOLIN HFA) 108 (90 Base) MCG/ACT inhaler Inhale 1-2 puffs into the lungs every 4 (four) hours as needed for wheezing or shortness of breath. 03/02/18   [name], MD  amLODipine (NORVASC) 5 MG tablet Take 1 tablet (5 mg total) by mouth 2 (two) times daily. 05/20/16   Jackolyn Confer, MD  aspirin EC 81 MG tablet Take by mouth.    [provider]  atorvastatin (LIPITOR) 10 MG tablet Take 1 tablet (10 mg total) by mouth at bedtime. 03/13/18   McLean-Scocuzza, Nino Glow, MD  cholecalciferol (VITAMIN D) 1000 UNITS tablet Take 1,000 Units by mouth daily.    [provider]  dutasteride (AVODART) 0.5 MG capsule Take 1 capsule (0.5 mg total) by mouth  daily. 09/01/18   Stoioff, Ronda Fairly, MD  Multiple Vitamins-Minerals (MEGA MULTIVITAMIN FOR MEN) TABS Take 2 capsules by mouth daily.    [provider]  omeprazole (PRILOSEC) 20 MG capsule Take 1 capsule (20 mg total) by mouth daily. 12/11/11   Jackolyn Confer, MD  predniSONE (DELTASONE) 20 MG tablet Take 3 tablets (60 mg total) by mouth  daily for 5 days. 02/17/19 02/22/19  Arta Silence, MD  sertraline (ZOLOFT) 50 MG tablet Take 1 tablet (50 mg total) by mouth daily. 07/07/17   [name], MD  tamsulosin (FLOMAX) 0.4 MG CAPS capsule Take 1 capsule (0.4 mg total) by mouth daily. 09/01/18   Stoioff, Ronda Fairly, MD  Tiotropium Bromide-Olodaterol (STIOLTO RESPIMAT) 2.5-2.5 MCG/ACT AERS Inhale 2 puffs into the lungs daily. 10/13/18   Wilhelmina Mcardle, MD  triamcinolone cream (KENALOG) 0.1 %  03/10/18   [provider]  valACYclovir (VALTREX) 500 MG tablet Take 1 tablet (500 mg total) by mouth every other day. 09/03/13   Jackolyn Confer, MD    Allergies Cephalexin  Family History  Problem Relation Age of Onset  . Heart disease Mother   . Cancer Maternal Aunt        lung  . Lung cancer Maternal Aunt   . Leukemia Maternal Aunt     Social History Social History   Tobacco Use  . Smoking status: Former Smoker    Packs/day: 1.50    Years: 50.00    Pack years: 75.00    Types: Cigarettes    Last attempt to quit: 09/10/1999    Years since quitting: 19.4  . Smokeless tobacco: Never Used  Substance Use Topics  . Alcohol use: No    Alcohol/week: 0.0 standard drinks  . Drug use: No    Review of Systems  Constitutional: No fever.  Positive for fatigue. Eyes: No redness. ENT: No sore throat. Cardiovascular: Denies chest pain. Respiratory: Positive for shortness of breath. Gastrointestinal: No vomiting or diarrhea.  Genitourinary: Negative for dysuria.  Musculoskeletal: Negative for back pain. Skin: Negative for rash. Neurological: Negative for headache.   ____________________________________________   PHYSICAL EXAM:  VITAL SIGNS: ED Triage Vitals  Enc Vitals Group     BP 02/17/19 1130 (!) 147/47     Pulse Rate 02/17/19 1130 72     Resp 02/17/19 1130 18     Temp 02/17/19 1130 98.2 F (36.8 C)     Temp Source 02/17/19 1130 Oral     SpO2 02/17/19 1130 99 %     Weight 02/17/19 1129 219 lb  1.5 oz (99.4 kg)     Height 02/17/19 1129 5\' 8"  (1.727 m)     Head Circumference --      Peak Flow --      Pain Score 02/17/19 1129 0     Pain Loc --      Pain Edu? --      Excl. in [place]? --     Constitutional: Alert and oriented.  Somewhat tired appearing and in no acute distress. Eyes: Conjunctivae are normal.  Head: Atraumatic. Nose: No congestion/rhinnorhea. Mouth/Throat: Mucous membranes are dry.   Neck: Normal range of motion.  Cardiovascular: Normal rate, regular rhythm. Grossly normal heart sounds.  Good peripheral circulation. Respiratory: Normal respiratory effort.  No retractions.  Coarse breath sounds bilaterally. Gastrointestinal: Soft and nontender. No distention.  Genitourinary: No flank tenderness. Musculoskeletal: 1+ bilateral lower extremity edema.  Extremities warm and well perfused.  Neurologic:  Normal speech and  language. No gross focal neurologic deficits are appreciated.  Skin:  Skin is warm and dry. No rash noted. Psychiatric: Mood and affect are normal. Speech and behavior are normal.  ____________________________________________   LABS (all labs ordered are listed, but only abnormal results are displayed)  Labs Reviewed  BASIC METABOLIC PANEL - Abnormal; Notable for the following components:      Result Value   Potassium 5.2 (*)    CO2 20 (*)    Glucose, Bld 107 (*)    BUN 48 (*)    Creatinine, Ser 2.46 (*)    GFR calc non Af Amer 23 (*)    GFR calc Af Amer 27 (*)    All other components within normal limits  CBC WITH DIFFERENTIAL/PLATELET - Abnormal; Notable for the following components:   WBC 14.5 (*)    RBC 3.52 (*)    Hemoglobin 9.8 (*)    HCT 31.6 (*)    RDW 15.7 (*)    Neutro Abs 9.4 (*)    Monocytes Absolute 1.3 (*)    All other components within normal limits  BLOOD GAS, VENOUS - Abnormal; Notable for the following components:   pCO2, Ven 43 (*)    pO2, Ven 51.0 (*)    Acid-base deficit 3.9 (*)    All other components within  normal limits  URINALYSIS, COMPLETE (UACMP) WITH MICROSCOPIC - Abnormal; Notable for the following components:   Color, Urine YELLOW (*)    APPearance HAZY (*)    Leukocytes,Ua TRACE (*)    All other components within normal limits  TROPONIN I  PROTIME-INR   ____________________________________________  EKG  ED ECG REPORT I, Arta Silence, the attending physician, personally viewed and interpreted this ECG.  Date: 02/17/2019 EKG Time: 1130 Rate: 70 Rhythm: normal sinus rhythm QRS Axis: normal Intervals: RBBB ST/T Wave abnormalities: normal Narrative Interpretation: no evidence of acute ischemia   ED ECG REPORT I, Arta Silence, the attending physician, personally viewed and interpreted this ECG.  Date: 02/17/2019 EKG Time: 1449 and Rate: 68 Rhythm: normal sinus rhythm QRS Axis: normal Intervals: RBBB ST/T Wave abnormalities: normal Narrative Interpretation: no evidence of acute ischemia  ____________________________________________  RADIOLOGY  CXR: No focal infiltrate or other acute abnormality [name] LE bilat: No acute DVT  ____________________________________________   PROCEDURES  Procedure(s) performed: No  Procedures  Critical Care performed: No ____________________________________________   INITIAL IMPRESSION / ASSESSMENT AND PLAN / ED COURSE  Pertinent labs & imaging results that were available during my care of the patient were reviewed by me and considered in my medical decision making (see chart for details).  85 year old male with PMH as noted above presents with worsening shortness of breath and fatigue over the last month with some cough and leg swelling.  I reviewed the past medical records in epic and care everywhere; the patient initially called his PMD yesterday with these complaints and was referred to urgent care.  At urgent care the patient had basic labs showing elevated WBC and elevated creatinine from baseline, as well as an  elevated d-dimer.  BNP was normal.  Differential includes worsening COPD (possibly with hypercapnia), CHF (although less likely given the normal BNP), ACS, or infectious cause such as bronchitis or pneumonia especially given the elevated WBC count.  Although the d-dimer is elevated, I have a lower suspicion for PE given the normal heart rate and lack of hypoxia.  Coronavirus is also on the differential although the patient has no fever and the symptoms have been  developing for several weeks.  In addition to the labs obtained earlier I will add on a troponin and a VBG, a UA, and chest x-ray.  Because of the elevated creatinine we cannot obtain a CT chest to rule out PE, so I will obtain bilateral DVT studies.  ----------------------------------------- 4:20 PM on 02/17/2019 -----------------------------------------  The patient's chest x-ray shows no significant acute findings and the ultrasound is negative for DVT.  ABG is essentially normal, and the UA is negative for signs of infection.  Lab work-up confirms an elevated creatinine.  The potassium is borderline elevated although some of this may be from hemoptysis.  EKG shows no peaked T waves or other concerning abnormalities consistent with hyperkalemia.  WBC count is slightly elevated although this is nonspecific and the patient otherwise has no signs of acute infection.  I ordered a bolus of normal saline due to the AKI.  I counseled the patient on the results of the work-up and recommended admission to the hospital for work-up and treatment of the acute renal insufficiency.  However, the patient states that he would strongly prefer to go home.  I discussed the risks with him including worsening kidney function, electrolyte abnormalities, fluid overload, permanent disability and death.  The patient was able to paraphrase these back to me.  He demonstrates appropriate decision-making capacity.  Since the patient has an appropriate understanding of  the risks and appears reasonable, there is no reason to sign him out AMA although I made it clear to him that we are offering admission and would recommend it.  I instructed him to call his PMD as soon as possible to arrange for follow-up.  I advised him that he could return at any time if he changes his mind, and also gave him thorough return precautions for worsening symptoms.  He expressed understanding. ____________________________________________   FINAL CLINICAL IMPRESSION(S) / ED DIAGNOSES  Final diagnoses:  Acute renal insufficiency      NEW MEDICATIONS STARTED DURING THIS VISIT:  New Prescriptions   PREDNISONE (DELTASONE) 20 MG TABLET    Take 3 tablets (60 mg total) by mouth daily for 5 days.     Note:  This document was prepared using Dragon voice recognition software and may include unintentional dictation errors.    Arta Silence, MD 02/17/19 1623

## 2019-02-17 NOTE — ED Notes (Signed)
US at bedside

## 2019-02-17 NOTE — ED Triage Notes (Signed)
PT arrives from [facility] with concerns over acute kidney injury due to elevated BUN & Creatinine. Pt states he went to [facility] due to shortness of breath and cough. Pt reports shortness of breath for "the last cough of months." Pt denies pain.

## 2019-02-17 NOTE — Telephone Encounter (Signed)
Noted  

## 2019-02-17 NOTE — Telephone Encounter (Signed)
Sent to PCP as an FYI  

## 2019-02-17 NOTE — Telephone Encounter (Signed)
Patient is currently in the waiting room at [facility] to be seen.

## 2019-02-17 NOTE — ED Notes (Signed)
Pt unable to provide urine specimen at this time; pt provided urinal and asked to call out when able to provdie specimen.

## 2019-02-18 ENCOUNTER — Telehealth: Payer: Self-pay | Admitting: Family Medicine

## 2019-02-18 NOTE — Telephone Encounter (Signed)
Patient was seen in ED on 02/17/19 DX with Acute renal insufficiency, was given prednisone 20 mg SIG reads take 3 tablets daily for 5 days, patient wife (DPR) was confused because when script was picked up Prednisone was 10 mg and read to take 6 tablets daily for 5 days. Patient wife was advised this was the same dosage just that because the tablets were less MG she would need to do as SIG. Instructed and take 6 tablets daily for 5 days. Patient wife also requested and ED follow up for patient requesting Dr. Terese Door. APPOINTMENT MADE FOR 02/23/19.

## 2019-02-22 ENCOUNTER — Other Ambulatory Visit: Payer: Medicare Other

## 2019-02-23 ENCOUNTER — Ambulatory Visit (INDEPENDENT_AMBULATORY_CARE_PROVIDER_SITE_OTHER): Payer: Medicare Other | Admitting: Internal Medicine

## 2019-02-23 DIAGNOSIS — N179 Acute kidney failure, unspecified: Secondary | ICD-10-CM | POA: Diagnosis not present

## 2019-02-23 DIAGNOSIS — E559 Vitamin D deficiency, unspecified: Secondary | ICD-10-CM | POA: Diagnosis not present

## 2019-02-23 DIAGNOSIS — E119 Type 2 diabetes mellitus without complications: Secondary | ICD-10-CM

## 2019-02-23 DIAGNOSIS — J441 Chronic obstructive pulmonary disease with (acute) exacerbation: Secondary | ICD-10-CM

## 2019-02-23 DIAGNOSIS — D72829 Elevated white blood cell count, unspecified: Secondary | ICD-10-CM | POA: Diagnosis not present

## 2019-02-23 MED ORDER — DOXYCYCLINE HYCLATE 100 MG PO TABS: 100.0000 mg | ORAL_TABLET | Freq: Two times a day (BID) | ORAL | 0 refills | Status: DC

## 2019-02-23 NOTE — Progress Notes (Signed)
Telephone Note  I connected with Donald [name]   on 02/23/19 at  9:15 AM EDT by a telephone and verified that I am speaking with the correct person using two identifiers.  Location patient: home Location provider:work  Persons participating in the virtual visit: patient, provider, Pts wife Sunday Spillers   I discussed the limitations of evaluation and management by telemedicine and the availability of in person appointments. The patient expressed understanding and agreed to proceed.   HPI: 1. Likely COPD exacerbation seen in the ED 02/17/19 for chest congestion and sob with exertion he is on prn Ventolin and Stioloto from the [place] still with sob with exertion with exercise I.e walking dogs. Prednisone helped given x 5 days in the ED. No wheezing, some cough at times.  Echo 12/23/18 valve abnormalities and EF >55%. CXR 02/17/2019 + emphysema  2. AKI with hyperkalemia Cr 2.4 from 1.6 and K 5.2 pt started drinking more water and does not take nsaids no trouble with emptying bladder but urinating more at night since drinking more water and on flomax and Avodart for mildly enlarged prostate  3. Suspected CLL f/u H/o 07/2019 with imaging labs and H/O Dr. Grayland Ormond     ROS: See pertinent positives and negatives per HPI.  Past Medical History:  Diagnosis Date  . Anemia   . Arthritis   . Cancer ([place])   . Esophageal reflux   . Hyperlipidemia   . Hypertension   . Pneumonia    recurrent  . Sebaceous cyst     Past Surgical History:  Procedure Laterality Date  . HEMORRHOID SURGERY      Family History  Problem Relation Age of Onset  . Heart disease Mother   . Cancer Maternal Aunt        lung  . Lung cancer Maternal Aunt   . Leukemia Maternal Aunt     SOCIAL HX: lives at home with wife    Current Outpatient Medications:  .  albuterol (PROVENTIL HFA;VENTOLIN HFA) 108 (90 Base) MCG/ACT inhaler, Inhale 1-2 puffs into the lungs every 4 (four) hours as needed for wheezing or shortness of breath.,  Disp: 1 Inhaler, Rfl: 10 .  amLODipine (NORVASC) 5 MG tablet, Take 1 tablet (5 mg total) by mouth 2 (two) times daily., Disp: 180 tablet, Rfl: 1 .  aspirin EC 81 MG tablet, Take by mouth., Disp: , Rfl:  .  atorvastatin (LIPITOR) 10 MG tablet, Take 1 tablet (10 mg total) by mouth at bedtime., Disp: 90 tablet, Rfl: 3 .  cholecalciferol (VITAMIN D) 1000 UNITS tablet, Take 1,000 Units by mouth daily., Disp: , Rfl:  .  doxycycline (VIBRA-TABS) 100 MG tablet, Take 1 tablet (100 mg total) by mouth 2 (two) times daily. With food x 5 days, Disp: 10 tablet, Rfl: 0 .  dutasteride (AVODART) 0.5 MG capsule, Take 1 capsule (0.5 mg total) by mouth daily., Disp: 90 capsule, Rfl: 1 .  Multiple Vitamins-Minerals (MEGA MULTIVITAMIN FOR MEN) TABS, Take 2 capsules by mouth daily., Disp: , Rfl:  .  omeprazole (PRILOSEC) 20 MG capsule, Take 1 capsule (20 mg total) by mouth daily., Disp: 30 capsule, Rfl: 6 .  sertraline (ZOLOFT) 50 MG tablet, Take 1 tablet (50 mg total) by mouth daily., Disp: 30 tablet, Rfl: 3 .  tamsulosin (FLOMAX) 0.4 MG CAPS capsule, Take 1 capsule (0.4 mg total) by mouth daily., Disp: 90 capsule, Rfl: 3 .  Tiotropium Bromide-Olodaterol (STIOLTO RESPIMAT) 2.5-2.5 MCG/ACT AERS, Inhale 2 puffs into the lungs daily., Disp:  1 Inhaler, Rfl: 10 .  triamcinolone cream (KENALOG) 0.1 %, , Disp: , Rfl:  .  valACYclovir (VALTREX) 500 MG tablet, Take 1 tablet (500 mg total) by mouth every other day., Disp: 15 tablet, Rfl: 3  EXAM: telephone  VITALS per patient if applicable:  GENERAL: alert, oriented, appears well and in no acute distress  LUNGS: cough on phone call, no signs of respiratory distress, breathing rate appears normal, no obvious gross SOB, gasping or wheezing  PSYCH/NEURO: pleasant and cooperative, no obvious depression or anxiety, speech and thought processing grossly intact  ASSESSMENT AND PLAN:  Discussed the following assessment and plan:  COPD exacerbation ([place]) - Plan: doxycycline  (VIBRA-TABS) 100 MG tablet Prn Ventolin with Stioloto disc with VA change to Trelegy  Reviewed echo less likely cardiac reason for sob and pt reports improved after steroids x 5 days due to neutrophils increased and bump in WBC tx doxy bid x 5 days   AKI (acute kidney injury) ([place]) - Plan: Basic Metabolic Panel (BMET) recheck labs this week fasting   Leukocytosis, unspecified type - Plan: CBC w/Diff f/u H/o suspected CLL/other leukemia  Type 2 diabetes mellitus without complication, without long-term current use of insulin (HCC) - Plan: Lipid panel, Hemoglobin A1c, Microalbumin / creatinine urine ratio -f/u with PCP   Vitamin D deficiency - Plan: Vitamin D (25 hydroxy)     I discussed the assessment and treatment plan with the patient. The patient was provided an opportunity to ask questions and all were answered. The patient agreed with the plan and demonstrated an understanding of the instructions.   The patient was advised to call back or seek an in-person evaluation if the symptoms worsen or if the condition fails to improve as anticipated.  Time 15 minutes   Delorise Jackson, MD

## 2019-02-24 ENCOUNTER — Telehealth: Payer: Self-pay | Admitting: *Deleted

## 2019-02-24 ENCOUNTER — Other Ambulatory Visit: Payer: Self-pay

## 2019-02-24 ENCOUNTER — Other Ambulatory Visit (INDEPENDENT_AMBULATORY_CARE_PROVIDER_SITE_OTHER): Payer: Medicare Other

## 2019-02-24 DIAGNOSIS — N179 Acute kidney failure, unspecified: Secondary | ICD-10-CM | POA: Diagnosis not present

## 2019-02-24 DIAGNOSIS — D72829 Elevated white blood cell count, unspecified: Secondary | ICD-10-CM | POA: Diagnosis not present

## 2019-02-24 DIAGNOSIS — E119 Type 2 diabetes mellitus without complications: Secondary | ICD-10-CM

## 2019-02-24 DIAGNOSIS — E559 Vitamin D deficiency, unspecified: Secondary | ICD-10-CM | POA: Diagnosis not present

## 2019-02-24 LAB — BASIC METABOLIC PANEL
BUN: 38 mg/dL — ABNORMAL HIGH (ref 6–23)
CO2: 25 mEq/L (ref 19–32)
Calcium: 9.2 mg/dL (ref 8.4–10.5)
Chloride: 105 mEq/L (ref 96–112)
Creatinine, Ser: 1.72 mg/dL — ABNORMAL HIGH (ref 0.40–1.50)
GFR: 38.05 mL/min — ABNORMAL LOW (ref >60.00)
Glucose, Bld: 99 mg/dL (ref 70–99)
Potassium: 4.7 mEq/L (ref 3.5–5.1)
Sodium: 140 mEq/L (ref 135–145)

## 2019-02-24 LAB — CBC WITH DIFFERENTIAL/PLATELET
Basophils Absolute: 0.1 10*3/uL (ref 0.0–0.1)
Basophils Relative: 0.5 % (ref 0.0–3.0)
Eosinophils Absolute: 1 10*3/uL — ABNORMAL HIGH (ref 0.0–0.7)
Eosinophils Relative: 4.8 % (ref 0.0–5.0)
HCT: 35.1 % — ABNORMAL LOW (ref 39.0–52.0)
Hemoglobin: 11.5 g/dL — ABNORMAL LOW (ref 13.0–17.0)
Lymphocytes Relative: 49.1 % — ABNORMAL HIGH (ref 12.0–46.0)
Lymphs Abs: 10.4 10*3/uL — ABNORMAL HIGH (ref 0.7–4.0)
MCHC: 32.9 g/dL (ref 30.0–36.0)
MCV: 85.1 fl (ref 78.0–100.0)
Monocytes Absolute: 1.2 10*3/uL — ABNORMAL HIGH (ref 0.1–1.0)
Monocytes Relative: 5.8 % (ref 3.0–12.0)
Neutro Abs: 8.5 10*3/uL — ABNORMAL HIGH (ref 1.4–7.7)
Neutrophils Relative %: 39.8 % — ABNORMAL LOW (ref 43.0–77.0)
Platelets: 289 10*3/uL (ref 150.0–400.0)
RBC: 4.12 Mil/uL — ABNORMAL LOW (ref 4.22–5.81)
RDW: 16.2 % — ABNORMAL HIGH (ref 11.5–15.5)
WBC: 21.2 10*3/uL (ref 4.0–10.5)

## 2019-02-24 LAB — LIPID PANEL
Cholesterol: 155 mg/dL (ref 0–200)
HDL: 47.4 mg/dL (ref >39.00)
NonHDL: 107.77
Total CHOL/HDL Ratio: 3
Triglycerides: 276 mg/dL — ABNORMAL HIGH (ref 0.0–149.0)
VLDL: 55.2 mg/dL — ABNORMAL HIGH (ref 0.0–40.0)

## 2019-02-24 LAB — VITAMIN D 25 HYDROXY (VIT D DEFICIENCY, FRACTURES): VITD: 81.38 ng/mL (ref 30.00–100.00)

## 2019-02-24 LAB — HEMOGLOBIN A1C: Hgb A1c MFr Bld: 6.9 % — ABNORMAL HIGH (ref 4.6–6.5)

## 2019-02-24 LAB — MICROALBUMIN / CREATININE URINE RATIO
Creatinine,U: 74.9 mg/dL
Microalb Creat Ratio: 3.3 mg/g (ref 0.0–30.0)
Microalb, Ur: 2.5 mg/dL — ABNORMAL HIGH (ref 0.0–1.9)

## 2019-02-24 LAB — LDL CHOLESTEROL, DIRECT: Direct LDL: 57 mg/dL

## 2019-02-24 NOTE — Telephone Encounter (Signed)
CRITICAL VALUE STICKER  CRITICAL VALUE: White Count-21.2  RECEIVER (on-site recipient of call): Jari Favre, CMA/XT  DATE & TIME NOTIFIED: 02/24/19 @ 11am  MESSENGER (representative from lab): Santiago Glad @ [facility]  MD NOTIFIED: Dr. Gayland Curry & Dr. Caryl Bis  TIME OF NOTIFICATION: 11:09am  RESPONSE: see lab results or phone note   **Sent to both the ordering provider & PCP**

## 2019-02-24 NOTE — Addendum Note (Signed)
Addended by: Leeanne Rio on: 02/24/2019 01:47 PM   Modules accepted: Orders

## 2019-02-24 NOTE — Telephone Encounter (Signed)
Noted when get labs back will [facility] Hemo/Onc as pt has ? CLL/SLL  Thanks tMS

## 2019-02-25 NOTE — Telephone Encounter (Signed)
Lyna Poser.  Hope you are well.  I am not concerned about his bump in his WBCs and as you point out prednisone is likely playing a role.  Thanks!  -Octavia Bruckner

## 2019-02-25 NOTE — Telephone Encounter (Signed)
Please see Dr Gary Fleet response. I would suggest rechecking his CBC to ensure it remains stable or trends down.

## 2019-02-25 NOTE — Telephone Encounter (Signed)
I am going to forward this message to Dr Grayland Ormond to get his input on the patients elevated WBC of 21.2 given that he follows with Dr Grayland Ormond for CLL. The patient had been on prednisone from an ED visit on 02/17/19 so that could be contributing.

## 2019-02-26 ENCOUNTER — Telehealth: Payer: Self-pay | Admitting: *Deleted

## 2019-02-26 DIAGNOSIS — D72829 Elevated white blood cell count, unspecified: Secondary | ICD-10-CM

## 2019-02-26 NOTE — Telephone Encounter (Signed)
Would he have this lab work done here?

## 2019-02-26 NOTE — Telephone Encounter (Signed)
Scheduled for lab work pt is scheduled for 11:15 for labs but plans to come in at 8:15 AM due to fasting is this OK?

## 2019-02-26 NOTE — Telephone Encounter (Signed)
Yes

## 2019-02-26 NOTE — Telephone Encounter (Signed)
Please place future order for lab appt on Monday.

## 2019-03-01 ENCOUNTER — Other Ambulatory Visit: Payer: Self-pay

## 2019-03-01 ENCOUNTER — Other Ambulatory Visit (INDEPENDENT_AMBULATORY_CARE_PROVIDER_SITE_OTHER): Payer: Medicare Other

## 2019-03-01 DIAGNOSIS — D72829 Elevated white blood cell count, unspecified: Secondary | ICD-10-CM

## 2019-03-01 LAB — CBC WITH DIFFERENTIAL/PLATELET
Basophils Absolute: 0.1 10*3/uL (ref 0.0–0.1)
Basophils Relative: 1 % (ref 0.0–3.0)
Eosinophils Absolute: 0.5 10*3/uL (ref 0.0–0.7)
Eosinophils Relative: 4.2 % (ref 0.0–5.0)
HCT: 33.9 % — ABNORMAL LOW (ref 39.0–52.0)
Hemoglobin: 11 g/dL — ABNORMAL LOW (ref 13.0–17.0)
Lymphocytes Relative: 40 % (ref 12.0–46.0)
Lymphs Abs: 5.1 10*3/uL — ABNORMAL HIGH (ref 0.7–4.0)
MCHC: 32.6 g/dL (ref 30.0–36.0)
MCV: 85.5 fl (ref 78.0–100.0)
Monocytes Absolute: 0.9 10*3/uL (ref 0.1–1.0)
Monocytes Relative: 6.8 % (ref 3.0–12.0)
Neutro Abs: 6.2 10*3/uL (ref 1.4–7.7)
Neutrophils Relative %: 48 % (ref 43.0–77.0)
Platelets: 229 10*3/uL (ref 150.0–400.0)
RBC: 3.96 Mil/uL — ABNORMAL LOW (ref 4.22–5.81)
RDW: 15.9 % — ABNORMAL HIGH (ref 11.5–15.5)
WBC: 12.8 10*3/uL — ABNORMAL HIGH (ref 4.0–10.5)

## 2019-03-30 ENCOUNTER — Telehealth: Payer: Self-pay | Admitting: Family Medicine

## 2019-03-30 ENCOUNTER — Other Ambulatory Visit: Payer: Self-pay

## 2019-03-30 ENCOUNTER — Ambulatory Visit (INDEPENDENT_AMBULATORY_CARE_PROVIDER_SITE_OTHER): Payer: Medicare Other | Admitting: Family Medicine

## 2019-03-30 ENCOUNTER — Ambulatory Visit: Payer: Self-pay

## 2019-03-30 ENCOUNTER — Ambulatory Visit: Payer: Self-pay | Admitting: Family Medicine

## 2019-03-30 ENCOUNTER — Telehealth: Payer: Self-pay

## 2019-03-30 ENCOUNTER — Encounter: Payer: Self-pay | Admitting: Family Medicine

## 2019-03-30 DIAGNOSIS — J449 Chronic obstructive pulmonary disease, unspecified: Secondary | ICD-10-CM

## 2019-03-30 MED ORDER — BUDESONIDE-FORMOTEROL FUMARATE 160-4.5 MCG/ACT IN AERO: 2.0000 | INHALATION_SPRAY | Freq: Two times a day (BID) | RESPIRATORY_TRACT | 3 refills | Status: DC

## 2019-03-30 MED ORDER — TIOTROPIUM BROMIDE MONOHYDRATE 18 MCG IN CAPS: 18.0000 ug | ORAL_CAPSULE | Freq: Every day | RESPIRATORY_TRACT | 0 refills | Status: DC

## 2019-03-30 NOTE — Telephone Encounter (Signed)
Copied from [place] [phone]. Topic: Appointment Scheduling - Scheduling Inquiry for Clinic >> Mar 30, 2019  7:08 AM Scherrie Gerlach wrote: Reason for CRM: wife called to make an appt for the pt for coughing.  Pt has COPD, but he is coughing a lot and sometimes hard to catch his breath he coughs so hard. Pt call for appt.

## 2019-03-30 NOTE — Progress Notes (Signed)
Patient ID: Donald [name], male   DOB: [birth year], 85 y.o.   MRN: [id number]    Virtual Visit via phone Note  This visit type was conducted due to national recommendations for restrictions regarding the COVID-19 pandemic (e.g. social distancing).  This format is felt to be most appropriate for this patient at this time.  All issues noted in this document were discussed and addressed.  No physical exam was performed (except for noted visual exam findings with Video Visits).   I connected with Donald [name] today at  1:00 PM EDT by a video enabled telemedicine application/phone and verified that I am speaking with the correct person using two identifiers. Location patient: home Location provider: [place] Persons participating in the virtual visit: patient, provider  I discussed the limitations, risks, security and privacy concerns of performing an evaluation and management service by telephone and the availability of in person appointments. I also discussed with the patient that there may be a patient responsible charge related to this service. The patient expressed understanding and agreed to proceed.  Interactive audio and video telecommunications were attempted between this provider and patient, however failed, due to patient having technical difficulties; able to log into [email or web address], but was unable to get his camera to work - we completed visit via phone.  HPI:  Patient and I connected via phone today due to complaint of worsening COPD & worsening cough.  States he has had cough for years, seems to be worse over the past 5 to 6 months.  Also noticed he will become short of breath with walking longer distances, has noticed this happening more often over the past 5 to 6 months as well.  Denies coughing of thick discolored phlegm, sometimes he will bring up clear white phlegm other times the cough is dry.  Denies fever or chills.  Denies chest pain or palpitations.  Denies GI or GU issues.   Patient states he has been on Stiolto for a while now, and does not notice an effect of this medication.  Feels that this medication is too expensive and since it is not helping him he would prefer not to take it anymore.  Patient states his wife gave him a dose of her Symbicort and he did notice that helped him to feel more opened up and less short of breath when he was out walking the dog.   ROS: See pertinent positives and negatives per HPI.  Past Medical History:  Diagnosis Date  . Anemia   . Arthritis   . Cancer ([place])   . Esophageal reflux   . Hyperlipidemia   . Hypertension   . Pneumonia    recurrent  . Sebaceous cyst     Past Surgical History:  Procedure Laterality Date  . HEMORRHOID SURGERY      Family History  Problem Relation Age of Onset  . Heart disease Mother   . Cancer Maternal Aunt        lung  . Lung cancer Maternal Aunt   . Leukemia Maternal Aunt    Social History   Tobacco Use  . Smoking status: Former Smoker    Packs/day: 1.50    Years: 50.00    Pack years: 75.00    Types: Cigarettes    Last attempt to quit: 09/10/1999    Years since quitting: 19.5  . Smokeless tobacco: Never Used  Substance Use Topics  . Alcohol use: No    Alcohol/week: 0.0 standard drinks    Current Outpatient  Medications:  .  albuterol (PROVENTIL HFA;VENTOLIN HFA) 108 (90 Base) MCG/ACT inhaler, Inhale 1-2 puffs into the lungs every 4 (four) hours as needed for wheezing or shortness of breath., Disp: 1 Inhaler, Rfl: 10 .  amLODipine (NORVASC) 5 MG tablet, Take 1 tablet (5 mg total) by mouth 2 (two) times daily., Disp: 180 tablet, Rfl: 1 .  aspirin EC 81 MG tablet, Take by mouth., Disp: , Rfl:  .  atorvastatin (LIPITOR) 10 MG tablet, Take 1 tablet (10 mg total) by mouth at bedtime., Disp: 90 tablet, Rfl: 3 .  cholecalciferol (VITAMIN D) 1000 UNITS tablet, Take 1,000 Units by mouth daily., Disp: , Rfl:  .  doxycycline (VIBRA-TABS) 100 MG tablet, Take 1 tablet (100 mg total)  by mouth 2 (two) times daily. With food x 5 days, Disp: 10 tablet, Rfl: 0 .  dutasteride (AVODART) 0.5 MG capsule, Take 1 capsule (0.5 mg total) by mouth daily., Disp: 90 capsule, Rfl: 1 .  Multiple Vitamins-Minerals (MEGA MULTIVITAMIN FOR MEN) TABS, Take 2 capsules by mouth daily., Disp: , Rfl:  .  omeprazole (PRILOSEC) 20 MG capsule, Take 1 capsule (20 mg total) by mouth daily., Disp: 30 capsule, Rfl: 6 .  sertraline (ZOLOFT) 50 MG tablet, Take 1 tablet (50 mg total) by mouth daily., Disp: 30 tablet, Rfl: 3 .  tamsulosin (FLOMAX) 0.4 MG CAPS capsule, Take 1 capsule (0.4 mg total) by mouth daily., Disp: 90 capsule, Rfl: 3 .  Tiotropium Bromide-Olodaterol (STIOLTO RESPIMAT) 2.5-2.5 MCG/ACT AERS, Inhale 2 puffs into the lungs daily., Disp: 1 Inhaler, Rfl: 10 .  triamcinolone cream (KENALOG) 0.1 %, , Disp: , Rfl:  .  valACYclovir (VALTREX) 500 MG tablet, Take 1 tablet (500 mg total) by mouth every other day., Disp: 15 tablet, Rfl: 3  EXAM:   GENERAL: alert, oriented, sounds well and in no acute distress  LUNGS: Speaking in full sentences, no signs of respiratory distress, breathing rate appears normal, no obvious gross SOB, gasping or wheezing, no coughing heard over our phone call.   PSYCH/NEURO: pleasant and cooperative, no obvious depression or anxiety, speech and thought processing grossly intact  ASSESSMENT AND PLAN:  Discussed the following assessment and plan:  Chronic obstructive pulmonary disease, unspecified COPD type ([place]) - Plan: tiotropium (SPIRIVA HANDIHALER) 18 MCG inhalation capsule, budesonide-formoterol (SYMBICORT) 160-4.5 MCG/ACT inhaler  Patient does not feel Stiolto is effective and it is also too expensive so he would prefer not to take this medication any longer.  He would like to go back to the Spiriva he had taken originally and also will add on Symbicort twice daily for COPD control.  Advised to wear a mask whenever he leaves his home to help protect himself from  coronavirus exposure and also discussed diligent handwashing.  He and wife have only left their home 3 or 4 times in the past 6 weeks for grocery shopping and otherwise have been staying home.     I discussed the assessment and treatment plan with the patient. The patient was provided an opportunity to ask questions and all were answered. The patient agreed with the plan and demonstrated an understanding of the instructions.   The patient was advised to call back or seek an in-person evaluation if the symptoms worsen or if the condition fails to improve as anticipated.  He will follow up in 4 weeks for recheck on breathing after medication changes.   Jodelle Green, FNP

## 2019-03-30 NOTE — Telephone Encounter (Signed)
Wife called on pt's behalf.  Reported he has been coughing harshly and has had increased shortness of breath since Sunday, 5/17.  Stated he coughs so hard that he gags, and has a whistle-sound in his nose.  Denied any c/o chest pain/ tightness.  Stated he is more short of breath with the coughing, with light activity, and even with talking.  Wife stated the pt. Is asleep at this time, but has not c/o of headache, sore throat, runny nose.   Denied fever; reported he doesn't feel warm.  Stated she called the office early this morning, and requested an appt.  Called Scheduler at this time.  Advised that a Scheduler will call pt. back to set up an appt.   Advised wife of plan.  Advised to call back to office if she has not rec'd a call back by 11:30 AM.  Verb. Understanding.     Reason for Disposition . HIGH RISK patient (e.g., age > 29 years, diabetes, heart or lung disease, weak immune system)  Answer Assessment - Initial Assessment Questions 1. COVID-19 DIAGNOSIS: "Who made your Coronavirus (COVID-19) diagnosis?" "Was it confirmed by a positive lab test?" If not diagnosed by a HCP, ask "Are there lots of cases (community spread) where you live?" (See public health department website, if unsure)   * MAJOR community spread: high number of cases; numbers of cases are increasing; many people hospitalized.   * MINOR community spread: low number of cases; not increasing; few or no people hospitalized     Present in Community  2. ONSET: "When did the COVID-19 symptoms start?"      On Sunday, 5/17 3. WORST SYMPTOM: "What is your worst symptom?" (e.g., cough, fever, shortness of breath, muscle aches)     Some baseline shortness of breath, but now more short of breath with cough and light activity 4. COUGH: "Do you have a cough?" If so, ask: "How bad is the cough?"       Increased coughing; coughing very forceful and causes him to gag  5. FEVER: "Do you have a fever?" If so, ask: "What is your temperature, how  was it measured, and when did it start?"     Wife does not think he feels feverish  6. RESPIRATORY STATUS: "Describe your breathing?" (e.g., shortness of breath, wheezing, unable to speak)      Denied wheezing; coughs so hard that it she can hear a whistle in his nose  7. BETTER-SAME-WORSE: "Are you getting better, staying the same or getting worse compared to yesterday?"  If getting worse, ask, "In what way?"     Has gotten worse since Sunday; c/o being very tired.  8. HIGH RISK DISEASE: "Do you have any chronic medical problems?" (e.g., asthma, heart or lung disease, weak immune system, etc.)     COPD, DM  9. PREGNANCY: "Is there any chance you are pregnant?" "When was your last menstrual period?"     N/a  10. OTHER SYMPTOMS: "Do you have any other symptoms?"  (e.g., runny nose, headache, sore throat, loss of smell)       Cough, shortness of breath, fatigue.  Wife denied any c/o headache, runny nose, sore throat, or chest pain  Protocols used: CORONAVIRUS (COVID-19) DIAGNOSED OR SUSPECTED-A-AH

## 2019-03-30 NOTE — Telephone Encounter (Signed)
Called Pt No answer, VM was full will try back later

## 2019-03-30 NOTE — Telephone Encounter (Signed)
Pt had appointment today with L. Guse.  Natividad Schlosser,cma

## 2019-03-30 NOTE — Telephone Encounter (Signed)
Please schedule him for 4 week follow up to recheck after inhaler changes.

## 2019-03-30 NOTE — Telephone Encounter (Signed)
Wife called in because they missed a phone call from the office.  It was from [place], [place] from [employer].     I called and spoke with Gae Bon.   She has spoken with Mrs. Durbin and made an appt for him at 1:00 today a virtual visit.   Gae Bon had called once without an answer but got in contact with Mrs. Garay on the 2nd call and made the appt.  I let Mrs. Meadowcroft know and she thanked me for my help.   "My husband was laying on the phone and I didn't hear it hear and I thought I had missed a call.

## 2019-03-31 ENCOUNTER — Telehealth: Payer: Self-pay | Admitting: Lab

## 2019-03-31 NOTE — Telephone Encounter (Signed)
After several attemps to contact Pt, called his wife phone and reached him and scheduled him a F/U Doxy  appt for 6/17/ 20 @ 8:45am

## 2019-03-31 NOTE — Telephone Encounter (Signed)
OK - thanks

## 2019-04-13 ENCOUNTER — Other Ambulatory Visit: Payer: Self-pay

## 2019-04-13 ENCOUNTER — Ambulatory Visit (INDEPENDENT_AMBULATORY_CARE_PROVIDER_SITE_OTHER): Payer: Medicare Other | Admitting: Internal Medicine

## 2019-04-13 DIAGNOSIS — J449 Chronic obstructive pulmonary disease, unspecified: Secondary | ICD-10-CM

## 2019-04-13 DIAGNOSIS — R269 Unspecified abnormalities of gait and mobility: Secondary | ICD-10-CM | POA: Diagnosis not present

## 2019-04-13 DIAGNOSIS — K209 Esophagitis, unspecified without bleeding: Secondary | ICD-10-CM

## 2019-04-13 DIAGNOSIS — R296 Repeated falls: Secondary | ICD-10-CM | POA: Insufficient documentation

## 2019-04-13 DIAGNOSIS — J029 Acute pharyngitis, unspecified: Secondary | ICD-10-CM | POA: Diagnosis not present

## 2019-04-13 MED ORDER — NYSTATIN 100000 UNIT/ML MT SUSP: 5.0000 mL | Freq: Four times a day (QID) | OROMUCOSAL | 0 refills | Status: DC

## 2019-04-13 NOTE — Progress Notes (Signed)
Telephone Note  I connected with Donald [name]   on 04/13/19 at 10:40 AM EDT by a telephone and verified that I am speaking with the correct person using two identifiers.  Location patient: home Location provider:work Persons participating in the virtual visit: patient, provider, pts wife   I discussed the limitations of evaluation and management by telemedicine and the availability of in person appointments. The patient expressed understanding and agreed to proceed.   HPI: 1. C/o sore throat since seeing ENT 6 months ago and since using inhalers for emphysema he did have a biopsy with  ENT but is not sure the results. Sore throat worse last month and causes him to cough and choke but he does not have trouble swallowing foods or liquids. Wife  Looking in throat this am states red and has a white spot on his tonsil. Pt denies GERD sx's  2. COPD stable using symbicort, spiriva and prn Albuterol  3. 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 Denies dizziness or the room spinning. PT in the past was expensive for him   ROS: See pertinent positives and negatives per HPI.  Past Medical History:  Diagnosis Date  . Anemia   . Arthritis   . Cancer ([place])   . Esophageal reflux   . Hyperlipidemia   . Hypertension   . Pneumonia    recurrent  . Sebaceous cyst     Past Surgical History:  Procedure Laterality Date  . HEMORRHOID SURGERY      Family History  Problem Relation Age of Onset  . Heart disease Mother   . Cancer Maternal Aunt        lung  . Lung cancer Maternal Aunt   . Leukemia Maternal Aunt     SOCIAL HX: married    Current Outpatient Medications:  .  albuterol (PROVENTIL HFA;VENTOLIN HFA) 108 (90 Base) MCG/ACT inhaler, Inhale 1-2 puffs into the lungs every 4 (four) hours as needed for wheezing or  shortness of breath., Disp: 1 Inhaler, Rfl: 10 .  amLODipine (NORVASC) 5 MG tablet, Take 1 tablet (5 mg total) by mouth 2 (two) times daily., Disp: 180 tablet, Rfl: 1 .  aspirin EC 81 MG tablet, Take by mouth., Disp: , Rfl:  .  atorvastatin (LIPITOR) 10 MG tablet, Take 1 tablet (10 mg total) by mouth at bedtime., Disp: 90 tablet, Rfl: 3 .  budesonide-formoterol (SYMBICORT) 160-4.5 MCG/ACT inhaler, Inhale 2 puffs into the lungs 2 (two) times daily., Disp: 1 Inhaler, Rfl: 3 .  cholecalciferol (VITAMIN D) 1000 UNITS tablet, Take 1,000 Units by mouth daily., Disp: , Rfl:  .  dutasteride (AVODART) 0.5 MG capsule, Take 1 capsule (0.5 mg total) by mouth daily., Disp: 90 capsule, Rfl: 1 .  Multiple Vitamins-Minerals (MEGA MULTIVITAMIN FOR MEN) TABS, Take 2 capsules by mouth daily., Disp: , Rfl:  .  nystatin (MYCOSTATIN) 100000 UNIT/ML suspension, Use as directed 5 mLs (500,000 Units total) in the mouth or throat 4 (four) times daily. Retain in mouth then swallow x 7-10 days, Disp: 473 mL, Rfl: 0 .  omeprazole (PRILOSEC) 20 MG capsule, Take 1 capsule (20 mg total) by mouth daily., Disp: 30 capsule, Rfl: 6 .  sertraline (ZOLOFT) 50 MG tablet, Take 1 tablet (50 mg total) by mouth daily., Disp: 30 tablet, Rfl: 3 .  tamsulosin (FLOMAX) 0.4 MG  CAPS capsule, Take 1 capsule (0.4 mg total) by mouth daily., Disp: 90 capsule, Rfl: 3 .  tiotropium (SPIRIVA HANDIHALER) 18 MCG inhalation capsule, Place 1 capsule (18 mcg total) into inhaler and inhale daily., Disp: 30 capsule, Rfl: 0 .  triamcinolone cream (KENALOG) 0.1 %, , Disp: , Rfl:  .  valACYclovir (VALTREX) 500 MG tablet, Take 1 tablet (500 mg total) by mouth every other day., Disp: 15 tablet, Rfl: 3  EXAM:  VITALS per patient if applicable:  GENERAL: alert, oriented, appears well and in no acute distress  HEENT: atraumatic, conjunttiva clear, no obvious abnormalities on inspection of external nose and ears -per wife throat is red with white spot on tonsil    NECK: normal movements of the head and neck  LUNGS: on inspection no signs of respiratory distress, breathing rate appears normal, no obvious gross SOB, gasping or wheezing  CV: no obvious cyanosis  MS: moves all visible extremities without noticeable abnormality  PSYCH/NEURO: pleasant and cooperative, no obvious depression or anxiety, speech and thought processing grossly intact  ASSESSMENT AND PLAN:  Discussed the following assessment and plan:  Esophagitis c/w yeast - Plan: nystatin (MYCOSTATIN) 100000 UNIT/ML suspension Sore throat -pt will let me know 04/16/2019 how he is feeling consider refer back to [facility] in future   Chronic obstructive pulmonary disease, unspecified COPD type (HCC)-stable continue inhalers   Abnormal gait Frequent falls -reviewed MRI 12/31/2018  -if continues rec cane if this not helping rolling walker with seat  -rec PT/OT pt c/w cost  -consider ENT vs neurology in future if they continue   HM Flu shot due 2020  prevnar utd  pna 25 overdue  Tdap had 09/10/11   Colonoscopy 2011 normal per pt consider cologuard until age 14 in future will be 87 11/25/18  PSA last 2.16 10/09/17  Smoking history assess at f/u  C/o CLL/SLL f/u with H/o  A1C 6.9 02/24/2019 goal for age <8.0 will monitor     I discussed the assessment and treatment plan with the patient. The patient was provided an opportunity to ask questions and all were answered. The patient agreed with the plan and demonstrated an understanding of the instructions.   The patient was advised to call back or seek an in-person evaluation if the symptoms worsen or if the condition fails to improve as anticipated.  Time spent 15 minutes  Delorise Jackson, MD

## 2019-04-21 ENCOUNTER — Telehealth: Payer: Self-pay | Admitting: Internal Medicine

## 2019-04-21 NOTE — Telephone Encounter (Signed)
Copied from [place] [id number]. Topic: Referral - Request for Referral >> Apr 21, 2019  7:35 AM Robina Ade, Helene Kelp D wrote: Has patient seen PCP for this complaint? Yes *If NO, is insurance requiring patient see PCP for this issue before PCP can refer them? Referral for which specialty: lung doctor Preferred provider/office: Dr. Larey Dresser on [address] In [place] close to [facility] Reason for referral: have patient lunch check

## 2019-04-23 ENCOUNTER — Telehealth: Payer: Self-pay | Admitting: Internal Medicine

## 2019-04-23 NOTE — Telephone Encounter (Signed)
Entered in error

## 2019-04-26 ENCOUNTER — Other Ambulatory Visit: Payer: Self-pay | Admitting: Internal Medicine

## 2019-04-26 DIAGNOSIS — R590 Localized enlarged lymph nodes: Secondary | ICD-10-CM

## 2019-04-26 DIAGNOSIS — R918 Other nonspecific abnormal finding of lung field: Secondary | ICD-10-CM

## 2019-04-26 NOTE — Telephone Encounter (Signed)
Call patient I rec he f/u with urology and if he wants to a cancer doctor at [facility] in [facility] who specializes in CLL/SLL Dr. Dorothea Ogle  Is he agreeable to those referrals?   Referred to Dr. Durwin Nora as well   [place]

## 2019-04-26 NOTE — Telephone Encounter (Signed)
Patient came by office. He is requesting a referral to Dr. Graciela Husbands a cardiothoracic surgeon at [place].

## 2019-04-27 ENCOUNTER — Other Ambulatory Visit: Payer: Self-pay | Admitting: Internal Medicine

## 2019-04-27 DIAGNOSIS — R350 Frequency of micturition: Secondary | ICD-10-CM

## 2019-04-27 DIAGNOSIS — N3941 Urge incontinence: Secondary | ICD-10-CM

## 2019-04-27 DIAGNOSIS — C9110 Chronic lymphocytic leukemia of B-cell type not having achieved remission: Secondary | ICD-10-CM

## 2019-04-27 DIAGNOSIS — N401 Enlarged prostate with lower urinary tract symptoms: Secondary | ICD-10-CM

## 2019-04-28 ENCOUNTER — Ambulatory Visit: Payer: Medicare Other | Admitting: Family Medicine

## 2019-05-04 ENCOUNTER — Other Ambulatory Visit: Payer: Self-pay

## 2019-05-04 ENCOUNTER — Ambulatory Visit (INDEPENDENT_AMBULATORY_CARE_PROVIDER_SITE_OTHER): Payer: Medicare Other | Admitting: Internal Medicine

## 2019-05-04 DIAGNOSIS — J441 Chronic obstructive pulmonary disease with (acute) exacerbation: Secondary | ICD-10-CM | POA: Diagnosis not present

## 2019-05-04 MED ORDER — AZITHROMYCIN 250 MG PO TABS: ORAL_TABLET | ORAL | 0 refills | Status: DC

## 2019-05-04 MED ORDER — HYDROCOD POLST-CPM POLST ER 10-8 MG/5ML PO SUER: 5.0000 mL | Freq: Every evening | ORAL | 0 refills | Status: DC | PRN

## 2019-05-04 MED ORDER — PREDNISONE 20 MG PO TABS: 20.0000 mg | ORAL_TABLET | Freq: Every day | ORAL | 0 refills | Status: DC

## 2019-05-04 NOTE — Progress Notes (Signed)
Virtual Visit via Video Note  I connected with Donald [name]   on 05/04/19 at  10:30 PM EDT by a video enabled telemedicine application and verified that I am speaking with the correct person using two identifiers.  Location patient: home Location provider:work Persons participating in the virtual visit: patient, provider, pts wife Sunday Spillers   I discussed the limitations of evaluation and management by telemedicine and the availability of in person appointments. The patient expressed understanding and agreed to proceed.   HPI: X 2 weeks c/o cough with sputum increased sob no never he was tested for covid at [facility] and negative wife with similar sx's tried Symbicort 160-8.5, spiriva w/o relief h/o COPD. Unable to sleep due to cough. He tried [employer] as well w/o relief   ROS: See pertinent positives and negatives per HPI.  Past Medical History:  Diagnosis Date  . Anemia   . Arthritis   . Cancer ([place])   . Esophageal reflux   . Hyperlipidemia   . Hypertension   . Pneumonia    recurrent  . Sebaceous cyst     Past Surgical History:  Procedure Laterality Date  . HEMORRHOID SURGERY      Family History  Problem Relation Age of Onset  . Heart disease Mother   . Cancer Maternal Aunt        lung  . Lung cancer Maternal Aunt   . Leukemia Maternal Aunt     SOCIAL HX: married lives with wife  Daughter patricia is contact [phone]    Current Outpatient Medications:  .  albuterol (PROVENTIL HFA;VENTOLIN HFA) 108 (90 Base) MCG/ACT inhaler, Inhale 1-2 puffs into the lungs every 4 (four) hours as needed for wheezing or shortness of breath., Disp: 1 Inhaler, Rfl: 10 .  amLODipine (NORVASC) 5 MG tablet, Take 1 tablet (5 mg total) by mouth 2 (two) times daily., Disp: 180 tablet, Rfl: 1 .  aspirin EC 81 MG tablet, Take by mouth., Disp: , Rfl:  .  atorvastatin (LIPITOR) 10 MG tablet, Take 1 tablet (10 mg total) by mouth at bedtime., Disp: 90 tablet, Rfl: 3 .  azithromycin (ZITHROMAX)  250 MG tablet, 2 pills day 1 and 1 pill day 2-5, Disp: 6 tablet, Rfl: 0 .  budesonide-formoterol (SYMBICORT) 160-4.5 MCG/ACT inhaler, Inhale 2 puffs into the lungs 2 (two) times daily., Disp: 1 Inhaler, Rfl: 3 .  chlorpheniramine-HYDROcodone (TUSSIONEX PENNKINETIC ER) 10-8 MG/5ML SUER, Take 5 mLs by mouth at bedtime as needed for cough., Disp: 140 mL, Rfl: 0 .  cholecalciferol (VITAMIN D) 1000 UNITS tablet, Take 1,000 Units by mouth daily., Disp: , Rfl:  .  dutasteride (AVODART) 0.5 MG capsule, Take 1 capsule (0.5 mg total) by mouth daily., Disp: 90 capsule, Rfl: 1 .  Multiple Vitamins-Minerals (MEGA MULTIVITAMIN FOR MEN) TABS, Take 2 capsules by mouth daily., Disp: , Rfl:  .  nystatin (MYCOSTATIN) 100000 UNIT/ML suspension, Use as directed 5 mLs (500,000 Units total) in the mouth or throat 4 (four) times daily. Retain in mouth then swallow x 7-10 days, Disp: 473 mL, Rfl: 0 .  omeprazole (PRILOSEC) 20 MG capsule, Take 1 capsule (20 mg total) by mouth daily., Disp: 30 capsule, Rfl: 6 .  predniSONE (DELTASONE) 20 MG tablet, Take 1 tablet (20 mg total) by mouth daily with breakfast. X 5 to 7 days, Disp: 7 tablet, Rfl: 0 .  sertraline (ZOLOFT) 50 MG tablet, Take 1 tablet (50 mg total) by mouth daily., Disp: 30 tablet, Rfl: 3 .  tamsulosin (  FLOMAX) 0.4 MG CAPS capsule, Take 1 capsule (0.4 mg total) by mouth daily., Disp: 90 capsule, Rfl: 3 .  tiotropium (SPIRIVA HANDIHALER) 18 MCG inhalation capsule, Place 1 capsule (18 mcg total) into inhaler and inhale daily., Disp: 30 capsule, Rfl: 0 .  triamcinolone cream (KENALOG) 0.1 %, , Disp: , Rfl:  .  valACYclovir (VALTREX) 500 MG tablet, Take 1 tablet (500 mg total) by mouth every other day., Disp: 15 tablet, Rfl: 3  EXAM:  VITALS per patient if applicable:  GENERAL: alert, oriented, appears well and in no acute distress  HEENT: atraumatic, conjunttiva clear, no obvious abnormalities on inspection of external nose and ears  NECK: normal movements of the  head and neck  LUNGS: on inspection no signs of respiratory distress, breathing rate appears normal, no obvious gross SOB, gasping or wheezing  CV: no obvious cyanosis  MS: moves all visible extremities without noticeable abnormality  PSYCH/NEURO: pleasant and cooperative, no obvious depression or anxiety, speech and thought processing grossly intact  ASSESSMENT AND PLAN:  Discussed the following assessment and plan:  COPD exacerbation (HCC) - Plan: predniSONE (DELTASONE) 20 MG tablet 5-7 days, azithromycin (ZITHROMAX) 250 MG tablet, chlorpheniramine-HYDROcodone (TUSSIONEX PENNKINETIC ER) 10-8 MG/5ML qhs prn, prn Albuterol, symbicort 160-8.5 2 pf bid rinse mouth and spiriva  Call back by Thursday to let me know how doing  COVID negative     I discussed the assessment and treatment plan with the patient. The patient was provided an opportunity to ask questions and all were answered. The patient agreed with the plan and demonstrated an understanding of the instructions.   The patient was advised to call back or seek an in-person evaluation if the symptoms worsen or if the condition fails to improve as anticipated.  Time spent 15 minutes Delorise Jackson, MD

## 2019-05-04 NOTE — Patient Instructions (Signed)
Chronic Obstructive Pulmonary Disease Exacerbation    Chronic obstructive pulmonary disease (COPD) is a long-term (chronic) condition that affects the lungs. COPD is a general term that can be used to describe many different lung problems that cause lung swelling (inflammation) and limit airflow, including chronic bronchitis and emphysema. COPD exacerbations are episodes when breathing symptoms become much worse and require extra treatment.  COPD exacerbations are usually caused by infections. Without treatment, COPD exacerbations can be severe and even life threatening. Frequent COPD exacerbations can cause further damage to the lungs.  What are the causes?  This condition may be caused by:  · Respiratory infections, including viral and bacterial infections.  · Exposure to smoke.  · Exposure to air pollution, chemical fumes, or dust.  · Things that give you an allergic reaction (allergens).  · Not taking your usual COPD medicines as directed.  · Underlying medical problems, such as congestive heart failure or infections not involving the lungs.  In many cases, the cause (trigger) of this condition is not known.  What increases the risk?  The following factors may make you more likely to develop this condition:  · Smoking cigarettes.  · Old age.  · Frequent prior COPD exacerbations.  What are the signs or symptoms?  Symptoms of this condition include:  · Increased coughing.  · Increased production of mucus from your lungs (sputum).  · Increased wheezing.  · Increased shortness of breath.  · Rapid or labored breathing.  · Chest tightness.  · Less energy than usual.  · Sleep disruption from symptoms.  · Confusion or increased sleepiness.  Often these symptoms happen or get worse even with the use of medicines.  How is this diagnosed?  This condition is diagnosed based on:  · Your medical history.  · A physical exam.  You may also have tests, including:  · A chest X-ray.  · Blood tests.  · Lung (pulmonary) function  tests.  How is this treated?  Treatment for this condition depends on the severity and cause of the symptoms. You may need to be admitted to a hospital for treatment. Some of the treatments commonly used to treat COPD exacerbations are:  · Antibiotic medicines. These may be used for severe exacerbations caused by a lung infection, such as pneumonia.  · Bronchodilators. These are inhaled medicines that expand the air passages and allow increased airflow.  · Steroid medicines. These act to reduce inflammation in the airways. They may be given with an inhaler, taken by mouth, or given through an IV tube inserted into one of your veins.  · Supplemental oxygen therapy.  · Airway clearing techniques, such as noninvasive ventilation (NIV) and positive expiratory pressure (PEP). These provide respiratory support through a mask or other noninvasive device. An example of this would be using a continuous positive airway pressure (CPAP) machine to improve delivery of oxygen into your lungs.  Follow these instructions at home:  Medicines  · Take over-the-counter and prescription medicines only as told by your health care provider. It is important to use correct technique with inhaled medicines.  · If you were prescribed an antibiotic medicine or oral steroid, take it as told by your health care provider. Do not stop taking the medicine even if you start to feel better.  Lifestyle  · Eat a healthy diet.  · Exercise regularly.  · Get plenty of sleep.  · Avoid exposure to all substances that irritate the airway, especially to tobacco smoke.  · Wash   your hands often with soap and water to reduce the risk of infection. If soap and water are not available, use hand sanitizer.  · During flu season, avoid enclosed spaces that are crowded with people.  General instructions  · Drink enough fluid to keep your urine clear or pale yellow (unless you have a medical condition that requires fluid restriction).  · Use a cool mist vaporizer. This  humidifies the air and makes it easier for you to clear your chest when you cough.  · If you have a home nebulizer and oxygen, continue to use them as told by your health care provider.  · Keep all follow-up visits as told by your health care provider. This is important.  How is this prevented?  · Stay up-to-date on pneumococcal and influenza (flu) vaccines. A flu shot is recommended every year to help prevent exacerbations.  · Do not use any products that contain nicotine or tobacco, such as cigarettes and e-cigarettes. Quitting smoking is very important in preventing COPD from getting worse and in preventing exacerbations from happening as often. If you need help quitting, ask your health care provider.  · Follow all instructions for pulmonary rehabilitation after a recent exacerbation. This can help prevent future exacerbations.  · Work with your health care provider to develop and follow an action plan. This tells you what steps to take when you experience certain symptoms.  Contact a health care provider if:  · You have a worsening of your regular COPD symptoms.  Get help right away if:  · You have worsening shortness of breath, even when resting.  · You have trouble talking.  · You have severe chest pain.  · You cough up blood.  · You have a fever.  · You have weakness, vomit repeatedly, or faint.  · You feel confused.  · You are not able to sleep because of your symptoms.  · You have trouble doing daily activities.  Summary  · COPD exacerbations are episodes when breathing symptoms become much worse and require extra treatment above your normal treatment.  · Exacerbations can be severe and even life threatening. Frequent COPD exacerbations can cause further damage to your lungs.  · COPD exacerbations are usually triggered by infections such as the flu, colds, and even pneumonia.  · Treatment for this condition depends on the severity and cause of the symptoms. You may need to be admitted to a hospital for  treatment.  · Quitting smoking is very important to prevent COPD from getting worse and to prevent exacerbations from happening as often.  This information is not intended to replace advice given to you by your health care provider. Make sure you discuss any questions you have with your health care provider.  Document Released: 08/25/2007 Document Revised: 04/23/2017 Document Reviewed: 12/02/2016  Elsevier Interactive Patient Education © 2019 [employer]

## 2019-05-07 ENCOUNTER — Telehealth: Payer: Self-pay

## 2019-05-07 ENCOUNTER — Other Ambulatory Visit: Payer: Self-pay

## 2019-05-07 ENCOUNTER — Ambulatory Visit (INDEPENDENT_AMBULATORY_CARE_PROVIDER_SITE_OTHER): Payer: Medicare Other | Admitting: Cardiothoracic Surgery

## 2019-05-07 ENCOUNTER — Encounter: Payer: Self-pay | Admitting: Cardiothoracic Surgery

## 2019-05-07 VITALS — BP 132/50 | HR 53 | Temp 97.7°F | Resp 15 | Ht 68.0 in | Wt 218.0 lb

## 2019-05-07 DIAGNOSIS — R918 Other nonspecific abnormal finding of lung field: Secondary | ICD-10-CM | POA: Diagnosis not present

## 2019-05-07 NOTE — Patient Instructions (Addendum)
We will call you to schedule your CT lung guided biopsy of the left lung and call you with the information later today or next week.   You will need to see Dr.Oaks after the biopsy to discuss your results and that appointment will be scheduled once we have a date for the procedure.

## 2019-05-07 NOTE — Addendum Note (Signed)
Addended by: Celene Kras on: 05/07/2019 11:40 AM   Modules accepted: Orders

## 2019-05-07 NOTE — Telephone Encounter (Signed)
Spoke with patient to let him know I have spoken with Pamala Hurry regarding CT guided lung biopsy . Pamala Hurry will call to let me know of appointment .  Patient will also need Covid 19 testing 3 days prior to having procedure.  Follow up appointment will be made once CT bx is scheduled.

## 2019-05-07 NOTE — Progress Notes (Signed)
Patient ID: Donald [name], male   DOB: [birth year], 85 y.o.   MRN: [id number]  Chief Complaint  Patient presents with  . New Patient (Initial Visit)    Referred for pulmonary nodules by Dr T. McLean-Scocuzza    Referred By Dr. Delight Hoh Reason for Referral left upper lobe mass  HPI Location, Quality, Duration, Severity, Timing, Context, Modifying Factors, Associated Signs and Symptoms.  Donald [name] is a 85 y.o. male.  I had the opportunity to see Donald [name] today face-to-face in my office regarding his left upper lobe mass.  He has a somewhat complicated history of a diagnosis of lymphoma for which she has been followed at both the [place] and here in our cancer center.  He has had multiple CT scans of his chest made over the course of the last year and he has had a small cluster of left upper lobe nodules that date back to February 2019.  Most recent CT scan performed in March of this year shows a slight increase in the size of this.  It now measures approximately 5 to 6 mm.  He states that over the last 6 months he has been getting progressively more short of breath.  He is unable to walk more than 150 feet before he has to stop.  Any significant exertion makes him short of breath.  He does not smoke at the present time but has a 50-pack-year smoking history.  He states that he recently saw 1 of our ENT physicians regarding a throat mass but ultimately was told he just had some laryngitis.  He is very desirous of understanding and being treated for what is going on in his left lung.   Past Medical History:  Diagnosis Date  . Anemia   . Arthritis   . Cancer ([place])   . COPD (chronic obstructive pulmonary disease) ([place])   . Emphysema of lung ([place])   . Esophageal reflux   . Hyperlipidemia   . Hypertension   . Pneumonia    recurrent  . Sebaceous cyst     Past Surgical History:  Procedure Laterality Date  . HEMORRHOID SURGERY    . SINUSOTOMY      Family History   Problem Relation Age of Onset  . Heart disease Mother   . Cancer Maternal Aunt        lung  . Lung cancer Maternal Aunt   . Leukemia Maternal Aunt     Social History Social History   Tobacco Use  . Smoking status: Former Smoker    Packs/day: 1.50    Years: 50.00    Pack years: 75.00    Types: Cigarettes    Quit date: 09/10/1999    Years since quitting: 19.6  . Smokeless tobacco: Never Used  Substance Use Topics  . Alcohol use: No    Alcohol/week: 0.0 standard drinks  . Drug use: No    Allergies  Allergen Reactions  . Cephalexin Rash    Current Outpatient Medications  Medication Sig Dispense Refill  . albuterol (PROVENTIL HFA;VENTOLIN HFA) 108 (90 Base) MCG/ACT inhaler Inhale 1-2 puffs into the lungs every 4 (four) hours as needed for wheezing or shortness of breath. 1 Inhaler 10  . amLODipine (NORVASC) 5 MG tablet Take 1 tablet (5 mg total) by mouth 2 (two) times daily. 180 tablet 1  . aspirin EC 81 MG tablet Take by mouth.    Marland Kitchen atorvastatin (LIPITOR) 10 MG tablet Take 1 tablet (10 mg total) by mouth  at bedtime. 90 tablet 3  . azithromycin (ZITHROMAX) 250 MG tablet 2 pills day 1 and 1 pill day 2-5 6 tablet 0  . budesonide-formoterol (SYMBICORT) 160-4.5 MCG/ACT inhaler Inhale 2 puffs into the lungs 2 (two) times daily. 1 Inhaler 3  . chlorpheniramine-HYDROcodone (TUSSIONEX PENNKINETIC ER) 10-8 MG/5ML SUER Take 5 mLs by mouth at bedtime as needed for cough. 140 mL 0  . cholecalciferol (VITAMIN D) 1000 UNITS tablet Take 1,000 Units by mouth daily.    Marland Kitchen dutasteride (AVODART) 0.5 MG capsule Take 1 capsule (0.5 mg total) by mouth daily. 90 capsule 1  . Multiple Vitamins-Minerals (MEGA MULTIVITAMIN FOR MEN) TABS Take 2 capsules by mouth daily.    Marland Kitchen nystatin (MYCOSTATIN) 100000 UNIT/ML suspension Use as directed 5 mLs (500,000 Units total) in the mouth or throat 4 (four) times daily. Retain in mouth then swallow x 7-10 days 473 mL 0  . omeprazole (PRILOSEC) 20 MG capsule Take 1  capsule (20 mg total) by mouth daily. 30 capsule 6  . predniSONE (DELTASONE) 20 MG tablet Take 1 tablet (20 mg total) by mouth daily with breakfast. X 5 to 7 days 7 tablet 0  . sertraline (ZOLOFT) 50 MG tablet Take 1 tablet (50 mg total) by mouth daily. 30 tablet 3  . tamsulosin (FLOMAX) 0.4 MG CAPS capsule Take 1 capsule (0.4 mg total) by mouth daily. 90 capsule 3  . tiotropium (SPIRIVA HANDIHALER) 18 MCG inhalation capsule Place 1 capsule (18 mcg total) into inhaler and inhale daily. 30 capsule 0  . triamcinolone cream (KENALOG) 0.1 %     . valACYclovir (VALTREX) 500 MG tablet Take 1 tablet (500 mg total) by mouth every other day. 15 tablet 3   No current facility-administered medications for this visit.       Review of Systems A complete review of systems was asked and was negative except for the following positive findings fatigue, dentures, bronchitis, shortness of breath, high cholesterol, easy bruising, hemorrhoids, prostate enlargement, joint pain.  Blood pressure (!) 132/50, pulse (!) 53, temperature 97.7 F (36.5 C), resp. rate 15, height 5\' 8"  (1.727 m), weight 218 lb (98.9 kg), SpO2 95 %.  Physical Exam CONSTITUTIONAL:  Pleasant, well-developed, well-nourished, and in no acute distress. EYES: Pupils equal and reactive to light, Sclera non-icteric EARS, NOSE, MOUTH AND THROAT:  The oropharynx was clear.  Dentition is absent repair.  Oral mucosa pink and moist. LYMPH NODES:  Lymph nodes in the neck and axillae were normal RESPIRATORY:  Lungs were clear.  Normal respiratory effort without pathologic use of accessory muscles of respiration CARDIOVASCULAR: Heart was regular without murmurs.  There were no carotid bruits. GI: The abdomen was soft, nontender, and nondistended. There were no palpable masses. There was no hepatosplenomegaly. There were normal bowel sounds in all quadrants. GU:  Rectal deferred.   MUSCULOSKELETAL:  Normal muscle strength and tone.  No clubbing or  cyanosis.   SKIN:  There were no pathologic skin lesions.  There were no nodules on palpation. NEUROLOGIC:  Sensation is normal.  Cranial nerves are grossly intact. PSYCH:  Oriented to person, place and time.  Mood and affect are normal.  Data Reviewed CT scans  I have personally reviewed the patient's imaging, laboratory findings and medical records.    Assessment    I have independently reviewed the patient's CT scans and had a long discussion with him and his wife about this.  There is a small cluster of nodules in the left upper lobe.  These do appear to be increased in size and number compared to the scan from February 2019.    Plan    I had a long discussion with him and his wife about the options at this point.  With his advanced age and poor functional status I do not believe that he would be a candidate for surgery.  However they are still very desirous of understanding and learning what is going on in his lung.  I do not believe he would be a candidate for endoscopic biopsy but perhaps our radiologist would consider transthoracic needle aspiration.  I reviewed this with him.  I told him I thought that it be unlikely that they would consent to biopsying this but if so that would be the best way to determine if this is lung cancer given his smoking history.  They would like to pursue that option.  They understand that he would not be a surgical candidate given his advanced age and current disability.       Donald Lewandowsky, MD 05/07/2019, 10:21 AM

## 2019-05-07 NOTE — Telephone Encounter (Signed)
Per Dr.Oaks -spoke with Radiology regarding CT lung guided biopsy- Radiology not able to do the biopsy. Patient added to Dr.Oaks 05/17/2019 virtual visit. Patient notified of cancellation of biopsy and reminded of appointment 05/17/2019 with Dr.Oaks.

## 2019-05-17 ENCOUNTER — Telehealth: Payer: Medicare Other | Admitting: Cardiothoracic Surgery

## 2019-05-17 ENCOUNTER — Other Ambulatory Visit: Payer: Self-pay

## 2019-05-18 ENCOUNTER — Telehealth (INDEPENDENT_AMBULATORY_CARE_PROVIDER_SITE_OTHER): Payer: Medicare Other | Admitting: Cardiothoracic Surgery

## 2019-05-18 ENCOUNTER — Other Ambulatory Visit: Payer: Self-pay

## 2019-05-18 DIAGNOSIS — R918 Other nonspecific abnormal finding of lung field: Secondary | ICD-10-CM

## 2019-05-18 NOTE — Progress Notes (Signed)
I attempted to reach this patient yesterday and today via phone for a virtual visit but could not reach him at any of the listed numbers.

## 2019-05-21 ENCOUNTER — Other Ambulatory Visit: Payer: Self-pay

## 2019-05-21 ENCOUNTER — Telehealth: Payer: Medicare Other | Admitting: Cardiothoracic Surgery

## 2019-05-21 ENCOUNTER — Ambulatory Visit (INDEPENDENT_AMBULATORY_CARE_PROVIDER_SITE_OTHER): Payer: Medicare Other | Admitting: Cardiothoracic Surgery

## 2019-05-21 ENCOUNTER — Encounter: Payer: Self-pay | Admitting: Cardiothoracic Surgery

## 2019-05-21 VITALS — BP 142/52 | HR 65 | Temp 97.7°F | Resp 18 | Ht 68.0 in | Wt 218.4 lb

## 2019-05-21 DIAGNOSIS — R918 Other nonspecific abnormal finding of lung field: Secondary | ICD-10-CM

## 2019-05-21 NOTE — Patient Instructions (Signed)
You have a CT scan scheduled by Dr.Finnegan 07/27/2019 be sure to have this done.

## 2019-05-21 NOTE — Progress Notes (Signed)
  Patient ID: Donald [name], male   DOB: 1933-03-28, 85 y.o.   MRN: [id number]  HISTORY: He returns today in follow-up.  He had a chest CT scan made back in March and was referred to me.  I saw the patient about 2 weeks ago and the patient was very desirous of understanding what was going on in his lungs.  I attempted to get a biopsy through our interventional radiologist but they declined.  The patient comes back today to discuss his options.  His wife states that he gets very short of breath even with minimal activities.  He has not smoked for over 30 years.   Vitals:   05/21/19 0810  BP: (!) 142/52  Pulse: 65  Resp: 18  Temp: 97.7 F (36.5 C)  SpO2: 96%     EXAM:    Resp: Lungs are clear bilaterally with some wheezes in the upper lung zones bilaterally.  No respiratory distress, normal effort. Heart:  Regular without murmurs Abd:  Abdomen is soft, non distended and non tender. No masses are palpable.  There is no rebound and no guarding.  Neurological: Alert and oriented to person, place, and time. Coordination normal.  Skin: Skin is warm and dry. No rash noted. No diaphoretic. No erythema. No pallor.  Psychiatric: Normal mood and affect. Normal behavior. Judgment and thought content normal.    ASSESSMENT: I have independently reviewed his CT scan and I went over that again with the patient and his wife.  There is a cluster of nodules in the left upper lobe.  They are enlarged compared to another scan a year ago.  The small size of these nodules make it unlikely that any percutaneous or endoscopic approach can obtain a diagnosis   PLAN:   The patient tells me he is scheduled to have a CT scan performed with Dr. Grayland Ormond in September of this year.  I encouraged him to keep that appointment.  I also told him that Dr. Grayland Ormond would be back in touch if there is any need for additional follow-up with me.  All of his questions were answered.    Nestor Lewandowsky, MD

## 2019-06-10 ENCOUNTER — Ambulatory Visit: Payer: Medicare HMO | Admitting: Urology

## 2019-06-10 ENCOUNTER — Encounter: Payer: Self-pay | Admitting: Urology

## 2019-06-15 ENCOUNTER — Ambulatory Visit: Payer: Medicare HMO | Admitting: Urology

## 2019-06-17 ENCOUNTER — Ambulatory Visit: Payer: Medicare Other | Admitting: Urology

## 2019-06-17 ENCOUNTER — Encounter: Payer: Self-pay | Admitting: Urology

## 2019-06-17 DIAGNOSIS — L738 Other specified follicular disorders: Secondary | ICD-10-CM | POA: Diagnosis not present

## 2019-06-17 DIAGNOSIS — L814 Other melanin hyperpigmentation: Secondary | ICD-10-CM | POA: Diagnosis not present

## 2019-06-17 DIAGNOSIS — L821 Other seborrheic keratosis: Secondary | ICD-10-CM | POA: Diagnosis not present

## 2019-06-23 DIAGNOSIS — I739 Peripheral vascular disease, unspecified: Secondary | ICD-10-CM | POA: Diagnosis not present

## 2019-06-23 DIAGNOSIS — R234 Changes in skin texture: Secondary | ICD-10-CM | POA: Diagnosis not present

## 2019-06-23 DIAGNOSIS — I6523 Occlusion and stenosis of bilateral carotid arteries: Secondary | ICD-10-CM | POA: Diagnosis not present

## 2019-06-23 DIAGNOSIS — I10 Essential (primary) hypertension: Secondary | ICD-10-CM | POA: Diagnosis not present

## 2019-06-23 DIAGNOSIS — G459 Transient cerebral ischemic attack, unspecified: Secondary | ICD-10-CM | POA: Diagnosis not present

## 2019-07-05 DIAGNOSIS — I6523 Occlusion and stenosis of bilateral carotid arteries: Secondary | ICD-10-CM | POA: Diagnosis not present

## 2019-07-06 DIAGNOSIS — I6523 Occlusion and stenosis of bilateral carotid arteries: Secondary | ICD-10-CM | POA: Diagnosis not present

## 2019-07-06 DIAGNOSIS — E7849 Other hyperlipidemia: Secondary | ICD-10-CM | POA: Diagnosis not present

## 2019-07-06 DIAGNOSIS — G459 Transient cerebral ischemic attack, unspecified: Secondary | ICD-10-CM | POA: Diagnosis not present

## 2019-07-06 DIAGNOSIS — I10 Essential (primary) hypertension: Secondary | ICD-10-CM | POA: Diagnosis not present

## 2019-07-06 DIAGNOSIS — J431 Panlobular emphysema: Secondary | ICD-10-CM | POA: Diagnosis not present

## 2019-07-13 ENCOUNTER — Ambulatory Visit: Payer: Medicare Other | Admitting: Urology

## 2019-07-13 ENCOUNTER — Encounter: Payer: Self-pay | Admitting: Urology

## 2019-07-14 DIAGNOSIS — R0600 Dyspnea, unspecified: Secondary | ICD-10-CM | POA: Diagnosis not present

## 2019-07-16 ENCOUNTER — Ambulatory Visit: Payer: Medicare Other | Admitting: Internal Medicine

## 2019-07-16 NOTE — Progress Notes (Signed)
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   INTERVAL HISTORY: Patient returns to clinic today for repeat laboratory work, further evaluation, and discussion of his imaging results.  He continues to feel well and remains asymptomatic. He has no neurologic complaints.  He denies any recent fevers or illnesses.  He has a good appetite and denies weight loss.  He denies any night sweats.  He has no chest pain, cough, hemoptysis, or shortness of breath.  He denies any nausea, vomiting, constipation, or diarrhea.  He has no urinary complaints.  Patient offers no specific complaints today.  Review of Systems  Constitutional: Negative.  Negative for fever, malaise/fatigue and weight loss.  Respiratory: Negative.   Cardiovascular: Negative.  Negative for chest pain and leg swelling.  Gastrointestinal: Negative.  Negative for abdominal pain.  Genitourinary: Negative.  Negative for dysuria.  Musculoskeletal: Negative.  Negative for back pain.  Skin: Negative.  Negative for rash.  Neurological: Negative.  Negative for dizziness, focal weakness, weakness and headaches.  Psychiatric/Behavioral: Negative.  The patient is not nervous/anxious.    As per HPI. Otherwise, a complete review of systems is negative.  PAST MEDICAL HISTORY: Past Medical History:  Diagnosis Date   Anemia    Arthritis    Cancer ([place])    COPD (chronic obstructive pulmonary disease) (H[facility])    Emphysema of lung ([place])    Esophageal reflux    Hyperlipidemia    Hypertension    Pneumonia    recurrent   Sebaceous cyst     PAST SURGICAL HISTORY: Past Surgical History:  Procedure Laterality Date     HEMORRHOID SURGERY     SINUSOTOMY      FAMILY HISTORY: Family History  Problem Relation Age of Onset   Heart disease Mother    Cancer Maternal Aunt        lung   Lung cancer Maternal Aunt    Leukemia Maternal Aunt     ADVANCED DIRECTIVES (Y/N):  N  HEALTH MAINTENANCE: Social History   Tobacco Use   Smoking status: Former Smoker    Packs/day: 1.50    Years: 50.00    Pack years: 75.00    Types: Cigarettes    Quit date: 09/10/1999    Years since quitting: 19.8   Smokeless tobacco: Never Used  Substance Use Topics   Alcohol use: No    Alcohol/week: 0.0 standard drinks   Drug use: No     Colonoscopy:  PAP:  Bone density:  Lipid panel:  Allergies  Allergen Reactions   Cephalexin Rash    Current Outpatient Medications  Medication Sig Dispense Refill   albuterol (PROVENTIL HFA;VENTOLIN HFA) 108 (90 Base) MCG/ACT inhaler Inhale 1-2 puffs into the lungs every 4 (four) hours as needed for wheezing or shortness of breath. 1 Inhaler 10   amLODipine (NORVASC) 5 MG tablet Take 1 tablet (5 mg total) by mouth 2 (two) times daily. 180 tablet 1   aspirin EC 81 MG tablet Take by mouth.     atorvastatin (LIPITOR) 10 MG tablet Take 1 tablet (10 mg total) by mouth at bedtime. 90 tablet 3   budesonide-formoterol (SYMBICORT) 160-4.5 MCG/ACT inhaler Inhale 2 puffs into the lungs 2 (two) times daily. 1 Inhaler 3  cholecalciferol (VITAMIN D) 1000 UNITS tablet Take 1,000 Units by mouth daily.     dutasteride (AVODART) 0.5 MG capsule Take 1 capsule (0.5 mg total) by mouth daily. 90 capsule 1   Multiple Vitamins-Minerals (MEGA MULTIVITAMIN FOR MEN) TABS Take 2 capsules by mouth daily.     omeprazole (PRILOSEC) 20 MG capsule Take 1 capsule (20 mg total) by mouth daily. 30 capsule 6   sertraline (ZOLOFT) 50 MG tablet Take 1 tablet (50 mg total) by mouth daily. 30 tablet 3   tamsulosin (FLOMAX) 0.4 MG CAPS capsule Take 1 capsule (0.4 mg total) by mouth daily. 90  capsule 3   tiotropium (SPIRIVA HANDIHALER) 18 MCG inhalation capsule Place 1 capsule (18 mcg total) into inhaler and inhale daily. 30 capsule 0   triamcinolone cream (KENALOG) 0.1 %      valACYclovir (VALTREX) 500 MG tablet Take 1 tablet (500 mg total) by mouth every other day. 15 tablet 3   chlorpheniramine-HYDROcodone (TUSSIONEX PENNKINETIC ER) 10-8 MG/5ML SUER Take 5 mLs by mouth at bedtime as needed for cough. (Patient not taking: Reported on 07/30/2019) 140 mL 0   nystatin (MYCOSTATIN) 100000 UNIT/ML suspension Use as directed 5 mLs (500,000 Units total) in the mouth or throat 4 (four) times daily. Retain in mouth then swallow x 7-10 days (Patient not taking: Reported on 07/30/2019) 473 mL 0   No current facility-administered medications for this visit.     OBJECTIVE: Vitals:   07/30/19 1028  BP: (!) 135/48  Pulse: 62  Resp: (!) 21  Temp: 98.1 F (36.7 C)  SpO2: 98%     Body mass index is 34.21 kg/m.    ECOG FS:0 - Asymptomatic  General: Well-developed, well-nourished, no acute distress. Eyes: Pink conjunctiva, anicteric sclera. HEENT: Normocephalic, moist mucous membranes. Lungs: Clear to auscultation bilaterally. Heart: Regular rate and rhythm. No rubs, murmurs, or gallops. Abdomen: Soft, nontender, nondistended. No organomegaly noted, normoactive bowel sounds. Musculoskeletal: No edema, cyanosis, or clubbing. Neuro: Alert, answering all questions appropriately. Cranial nerves grossly intact. Skin: No rashes or petechiae noted. Psych: Normal affect. Lymphatics: No cervical, calvicular, axillary or inguinal LAD.  LAB RESULTS:  Lab Results  Component Value Date   NA 140 02/24/2019   K 4.7 02/24/2019   CL 105 02/24/2019   CO2 25 02/24/2019   GLUCOSE 99 02/24/2019   BUN 38 (H) 02/24/2019   CREATININE 1.72 (H) 02/24/2019   CALCIUM 9.2 02/24/2019   PROT 6.7 12/29/2018   ALBUMIN 4.1 12/29/2018   AST 20 12/29/2018   ALT 17 12/29/2018   ALKPHOS 62 12/29/2018    BILITOT 0.3 12/29/2018   GFRNONAA 23 (L) 02/17/2019   GFRAA 27 (L) 02/17/2019    Lab Results  Component Value Date   WBC 11.4 (H) 07/30/2019   NEUTROABS 5.8 07/30/2019   HGB 10.0 (L) 07/30/2019   HCT 31.7 (L) 07/30/2019   MCV 88.1 07/30/2019   PLT 239 07/30/2019     STUDIES: Ct Chest W Contrast  Result Date: 07/27/2019 CLINICAL DATA:  Follow-up CLL EXAM: CT CHEST, ABDOMEN, AND PELVIS WITH CONTRAST TECHNIQUE: Multidetector CT imaging of the chest, abdomen and pelvis was performed following the standard protocol during bolus administration of intravenous contrast. CONTRAST:  134m OMNIPAQUE IOHEXOL 300 MG/ML SOLN, additional oral enteric contrast COMPARISON:  CT chest abdomen pelvis 01/21/2019, 07/21/2018, PET-CT, 04/10/2018 FINDINGS: CT CHEST FINDINGS Cardiovascular: Aortic atherosclerosis. Normal heart size. Scattered three-vessel coronary artery calcifications. No pericardial effusion. Mediastinum/Nodes: There are numerous enlarged bilateral axillary and mediastinal lymph nodes,  the largest pretracheal lymph node measuring 1.9 x 1.8 cm, slightly enlarged compared to prior examination at which time it measured 1.5 x 1.5 cm (series 2, image 19). The largest right axillary node measures 1.8 x 1.2 cm, slightly enlarged compared to prior examination at which time it measured 1.3 x 0.8 cm (series 2, image 20). Thyroid gland, trachea, and esophagus demonstrate no significant findings. Lungs/Pleura: Mild centrilobular emphysema. There has been interval enlargement of a lobulated pulmonary nodule or adjacent clustered nodules of the apical left upper lobe, now measuring 1.4 x 0.8 cm, previously 1.0 x 0.7 cm when measured similarly (series 3, image 28). There are additional, occasional scattered ground-glass opacities, for example in the posterior right upper lobe (series 3, image 59) and mild bibasilar scarring. No pleural effusion or pneumothorax. Musculoskeletal: No chest wall mass or suspicious bone  lesions identified. CT ABDOMEN PELVIS FINDINGS Hepatobiliary: No solid liver abnormality is seen. No gallstones, gallbladder wall thickening, or biliary dilatation. Pancreas: Unremarkable. No pancreatic ductal dilatation or surrounding inflammatory changes. Spleen: Normal in size without significant abnormality. Adrenals/Urinary Tract: Adrenal glands are unremarkable. Kidneys are normal, without renal calculi, solid lesion, or hydronephrosis. Bladder is unremarkable. Stomach/Bowel: Stomach is within normal limits. Appendix appears normal. No evidence of bowel wall thickening, distention, or inflammatory changes. Descending and sigmoid diverticulosis. Vascular/Lymphatic: Aortic atherosclerosis. There are numerous prominent and enlarged retroperitoneal, iliac, and inguinal lymph nodes, the largest left inguinal nodes enlarged measuring 1.0 x 0.9 cm, previously 7 mm (series 2, image 119). The largest right iliac lymph node measures 2.1 x 1.2 cm, previously 1.9 by 1.0 cm (series 2, image 114). There are numerous enlarged small bowel mesenteric lymph nodes, not significantly changed, the largest measuring 1.8 x 1.2 cm, previously 2.0 x 1.2 cm (series 2, image 68). Reproductive: Prostatomegaly. Other: No abdominal wall hernia or abnormality. No abdominopelvic ascites. Musculoskeletal: No acute or significant osseous findings. IMPRESSION: 1. There are numerous enlarged bilateral axillary and mediastinal lymph nodes, the largest pretracheal lymph node measuring 1.9 x 1.8 cm, slightly enlarged compared to prior examination at which time it measured 1.5 x 1.5 cm (series 2, image 19). The largest right axillary node measures 1.8 x 1.2 cm, slightly enlarged compared to prior examination at which time it measured 1.3 x 0.8 cm (series 2, image 20). 2. There are numerous prominent and enlarged retroperitoneal, iliac, and inguinal lymph nodes, the largest left inguinal nodes enlarged measuring 1.0 x 0.9 cm, previously 7 mm (series  2, image 119). The largest right iliac lymph node measures 2.1 x 1.2 cm, previously 1.9 by 1.0 cm (series 2, image 114). There are numerous enlarged small bowel mesenteric lymph nodes, not significantly changed, the largest measuring 1.8 x 1.2 cm, previously 2.0 x 1.2 cm (series 2, image 68). 3. Overall findings are consistent with slight interval worsening of lymphadenopathy. 4. There has been interval enlargement of a lobulated pulmonary nodule or adjacent clustered nodules of the apical left upper lobe, now measuring 1.4 x 0.8 cm, previously 1.0 x 0.7 cm when measured similarly (series 3, image 28). This finding has clearly increased in size on sequential prior examinations and is concerning for malignancy, although atypical infection or inflammation may is a differential consideration. Pulmonary involvement of CLL is considered much less likely. PET-CT may be used to assess for FDG avidity and tissue sampling can be considered. 5. Additional scattered nonspecific infectious or inflammatory ground-glass opacities of the lungs. 6. Coronary artery disease. Aortic Atherosclerosis (ICD10-I70.0) and Emphysema (ICD10-J43.9). Electronically Signed  By: Eddie Candle M.D.   On: 07/27/2019 11:33   Ct Abdomen Pelvis W Contrast  Result Date: 07/27/2019 CLINICAL DATA:  Follow-up CLL EXAM: CT CHEST, ABDOMEN, AND PELVIS WITH CONTRAST TECHNIQUE: Multidetector CT imaging of the chest, abdomen and pelvis was performed following the standard protocol during bolus administration of intravenous contrast. CONTRAST:  149m OMNIPAQUE IOHEXOL 300 MG/ML SOLN, additional oral enteric contrast COMPARISON:  CT chest abdomen pelvis 01/21/2019, 07/21/2018, PET-CT, 04/10/2018 FINDINGS: CT CHEST FINDINGS Cardiovascular: Aortic atherosclerosis. Normal heart size. Scattered three-vessel coronary artery calcifications. No pericardial effusion. Mediastinum/Nodes: There are numerous enlarged bilateral axillary and mediastinal lymph nodes, the  largest pretracheal lymph node measuring 1.9 x 1.8 cm, slightly enlarged compared to prior examination at which time it measured 1.5 x 1.5 cm (series 2, image 19). The largest right axillary node measures 1.8 x 1.2 cm, slightly enlarged compared to prior examination at which time it measured 1.3 x 0.8 cm (series 2, image 20). Thyroid gland, trachea, and esophagus demonstrate no significant findings. Lungs/Pleura: Mild centrilobular emphysema. There has been interval enlargement of a lobulated pulmonary nodule or adjacent clustered nodules of the apical left upper lobe, now measuring 1.4 x 0.8 cm, previously 1.0 x 0.7 cm when measured similarly (series 3, image 28). There are additional, occasional scattered ground-glass opacities, for example in the posterior right upper lobe (series 3, image 59) and mild bibasilar scarring. No pleural effusion or pneumothorax. Musculoskeletal: No chest wall mass or suspicious bone lesions identified. CT ABDOMEN PELVIS FINDINGS Hepatobiliary: No solid liver abnormality is seen. No gallstones, gallbladder wall thickening, or biliary dilatation. Pancreas: Unremarkable. No pancreatic ductal dilatation or surrounding inflammatory changes. Spleen: Normal in size without significant abnormality. Adrenals/Urinary Tract: Adrenal glands are unremarkable. Kidneys are normal, without renal calculi, solid lesion, or hydronephrosis. Bladder is unremarkable. Stomach/Bowel: Stomach is within normal limits. Appendix appears normal. No evidence of bowel wall thickening, distention, or inflammatory changes. Descending and sigmoid diverticulosis. Vascular/Lymphatic: Aortic atherosclerosis. There are numerous prominent and enlarged retroperitoneal, iliac, and inguinal lymph nodes, the largest left inguinal nodes enlarged measuring 1.0 x 0.9 cm, previously 7 mm (series 2, image 119). The largest right iliac lymph node measures 2.1 x 1.2 cm, previously 1.9 by 1.0 cm (series 2, image 114). There are  numerous enlarged small bowel mesenteric lymph nodes, not significantly changed, the largest measuring 1.8 x 1.2 cm, previously 2.0 x 1.2 cm (series 2, image 68). Reproductive: Prostatomegaly. Other: No abdominal wall hernia or abnormality. No abdominopelvic ascites. Musculoskeletal: No acute or significant osseous findings. IMPRESSION: 1. There are numerous enlarged bilateral axillary and mediastinal lymph nodes, the largest pretracheal lymph node measuring 1.9 x 1.8 cm, slightly enlarged compared to prior examination at which time it measured 1.5 x 1.5 cm (series 2, image 19). The largest right axillary node measures 1.8 x 1.2 cm, slightly enlarged compared to prior examination at which time it measured 1.3 x 0.8 cm (series 2, image 20). 2. There are numerous prominent and enlarged retroperitoneal, iliac, and inguinal lymph nodes, the largest left inguinal nodes enlarged measuring 1.0 x 0.9 cm, previously 7 mm (series 2, image 119). The largest right iliac lymph node measures 2.1 x 1.2 cm, previously 1.9 by 1.0 cm (series 2, image 114). There are numerous enlarged small bowel mesenteric lymph nodes, not significantly changed, the largest measuring 1.8 x 1.2 cm, previously 2.0 x 1.2 cm (series 2, image 68). 3. Overall findings are consistent with slight interval worsening of lymphadenopathy. 4. There has been interval enlargement  of a lobulated pulmonary nodule or adjacent clustered nodules of the apical left upper lobe, now measuring 1.4 x 0.8 cm, previously 1.0 x 0.7 cm when measured similarly (series 3, image 28). This finding has clearly increased in size on sequential prior examinations and is concerning for malignancy, although atypical infection or inflammation may is a differential consideration. Pulmonary involvement of CLL is considered much less likely. PET-CT may be used to assess for FDG avidity and tissue sampling can be considered. 5. Additional scattered nonspecific infectious or inflammatory  ground-glass opacities of the lungs. 6. Coronary artery disease. Aortic Atherosclerosis (ICD10-I70.0) and Emphysema (ICD10-J43.9). Electronically Signed   By: Eddie Candle M.D.   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  CT scan results from July 27, 2019 reviewed independently and report as above with moderately progressive lymphadenopathy in bilateral axilla, mediastinal, retroperitoneal, iliac, and inguinal lymph nodes.  Previously, his flow cytometry panel reported a CD5+ and CD23+ clonal B-cell population, highly suspicious for CLL/SLL.  Patient does not require biopsy, but will consider one if patient required treatment.  He does not require bone marrow biopsy.  Patient is having a PET scan to further evaluate the pulmonary nodule noted.  He will have a video assisted telemedicine visit 1 to 2 days after his PET scan. 2.  Left upper lobe pulmonary nodule: Increased in size from 1 cm to 1.4 cm.  PET scan as above to further evaluate.  Video assisted telemedicine visit 1 to 2 days after PET.  3.  Anemia: Mild, monitor.   Patient expressed understanding and was in agreement with this plan. He also understands that He can call clinic at any time with any questions, concerns, or complaints.   Cancer Staging No matching staging information was found for the patient.  Lloyd Huger, MD   07/30/2019 12:49 PM

## 2019-07-27 ENCOUNTER — Other Ambulatory Visit: Payer: Self-pay

## 2019-07-27 ENCOUNTER — Ambulatory Visit
Admission: RE | Admit: 2019-07-27 | Discharge: 2019-07-27 | Disposition: A | Discharge status: Home / Self Care | Payer: Medicare Other | Source: Ambulatory Visit | Attending: Oncology | Admitting: Oncology

## 2019-07-27 DIAGNOSIS — C9110 Chronic lymphocytic leukemia of B-cell type not having achieved remission: Secondary | ICD-10-CM | POA: Diagnosis not present

## 2019-07-27 MED ORDER — IOHEXOL 300 MG/ML  SOLN
100.0000 mL | Freq: Once | INTRAMUSCULAR | Status: AC | PRN
  Administered 2019-07-27: 100 mL via INTRAVENOUS

## 2019-07-29 ENCOUNTER — Ambulatory Visit: Payer: Medicare Other | Admitting: Internal Medicine

## 2019-07-30 ENCOUNTER — Other Ambulatory Visit: Payer: Self-pay

## 2019-07-30 ENCOUNTER — Encounter: Payer: Self-pay | Admitting: Oncology

## 2019-07-30 ENCOUNTER — Inpatient Hospital Stay (HOSPITAL_BASED_OUTPATIENT_CLINIC_OR_DEPARTMENT_OTHER): Payer: Medicare Other | Admitting: Oncology

## 2019-07-30 ENCOUNTER — Inpatient Hospital Stay: Payer: Medicare Other | Attending: Oncology

## 2019-07-30 VITALS — BP 135/48 | HR 62 | Temp 98.1°F | Resp 21 | Wt 225.0 lb

## 2019-07-30 DIAGNOSIS — C9110 Chronic lymphocytic leukemia of B-cell type not having achieved remission: Secondary | ICD-10-CM

## 2019-07-30 DIAGNOSIS — F17210 Nicotine dependence, cigarettes, uncomplicated: Secondary | ICD-10-CM | POA: Insufficient documentation

## 2019-07-30 DIAGNOSIS — Z79899 Other long term (current) drug therapy: Secondary | ICD-10-CM | POA: Diagnosis not present

## 2019-07-30 DIAGNOSIS — R911 Solitary pulmonary nodule: Secondary | ICD-10-CM | POA: Insufficient documentation

## 2019-07-30 DIAGNOSIS — D649 Anemia, unspecified: Secondary | ICD-10-CM | POA: Diagnosis not present

## 2019-07-30 DIAGNOSIS — R590 Localized enlarged lymph nodes: Secondary | ICD-10-CM | POA: Insufficient documentation

## 2019-07-30 DIAGNOSIS — D72829 Elevated white blood cell count, unspecified: Secondary | ICD-10-CM

## 2019-07-30 LAB — CBC WITH DIFFERENTIAL/PLATELET
Abs Immature Granulocytes: 0.07 10*3/uL (ref 0.00–0.07)
Basophils Absolute: 0.1 10*3/uL (ref 0.0–0.1)
Basophils Relative: 1 %
Eosinophils Absolute: 0.6 10*3/uL — ABNORMAL HIGH (ref 0.0–0.5)
Eosinophils Relative: 5 %
HCT: 31.7 % — ABNORMAL LOW (ref 39.0–52.0)
Hemoglobin: 10 g/dL — ABNORMAL LOW (ref 13.0–17.0)
Immature Granulocytes: 1 %
Lymphocytes Relative: 32 %
Lymphs Abs: 3.6 10*3/uL (ref 0.7–4.0)
MCH: 27.8 pg (ref 26.0–34.0)
MCHC: 31.5 g/dL (ref 30.0–36.0)
MCV: 88.1 fL (ref 80.0–100.0)
Monocytes Absolute: 1.2 10*3/uL — ABNORMAL HIGH (ref 0.1–1.0)
Monocytes Relative: 11 %
Neutro Abs: 5.8 10*3/uL (ref 1.7–7.7)
Neutrophils Relative %: 50 %
Platelets: 239 10*3/uL (ref 150–400)
RBC: 3.6 MIL/uL — ABNORMAL LOW (ref 4.22–5.81)
RDW: 14.9 % (ref 11.5–15.5)
WBC: 11.4 10*3/uL — ABNORMAL HIGH (ref 4.0–10.5)
nRBC: 0 % (ref 0.0–0.2)

## 2019-07-30 NOTE — Progress Notes (Signed)
Pt in for follow up, has questions regarding recent scans MD had done.

## 2019-08-05 ENCOUNTER — Other Ambulatory Visit: Payer: Self-pay

## 2019-08-05 ENCOUNTER — Ambulatory Visit
Admission: RE | Admit: 2019-08-05 | Discharge: 2019-08-05 | Disposition: A | Discharge status: Home / Self Care | Payer: Medicare Other | Source: Ambulatory Visit | Attending: Oncology | Admitting: Oncology

## 2019-08-05 DIAGNOSIS — C9110 Chronic lymphocytic leukemia of B-cell type not having achieved remission: Secondary | ICD-10-CM | POA: Diagnosis not present

## 2019-08-05 DIAGNOSIS — R590 Localized enlarged lymph nodes: Secondary | ICD-10-CM | POA: Diagnosis not present

## 2019-08-05 DIAGNOSIS — R911 Solitary pulmonary nodule: Secondary | ICD-10-CM | POA: Insufficient documentation

## 2019-08-05 LAB — GLUCOSE, CAPILLARY: Glucose-Capillary: 105 mg/dL — ABNORMAL HIGH (ref 70–99)

## 2019-08-05 MED ORDER — FLUDEOXYGLUCOSE F - 18 (FDG) INJECTION
12.2400 | Freq: Once | INTRAVENOUS | Status: AC | PRN
  Administered 2019-08-05: 12.24 via INTRAVENOUS

## 2019-08-09 ENCOUNTER — Other Ambulatory Visit: Payer: Self-pay

## 2019-08-09 ENCOUNTER — Encounter: Payer: Self-pay | Admitting: Oncology

## 2019-08-09 NOTE — Progress Notes (Signed)
Patient stated that he stays tired and fatigued all the time. Patient would like his CT Scans and PET results.

## 2019-08-10 ENCOUNTER — Inpatient Hospital Stay (HOSPITAL_BASED_OUTPATIENT_CLINIC_OR_DEPARTMENT_OTHER): Payer: Medicare Other | Admitting: Oncology

## 2019-08-10 DIAGNOSIS — R918 Other nonspecific abnormal finding of lung field: Secondary | ICD-10-CM | POA: Insufficient documentation

## 2019-08-10 DIAGNOSIS — R911 Solitary pulmonary nodule: Secondary | ICD-10-CM | POA: Diagnosis not present

## 2019-08-10 DIAGNOSIS — C9110 Chronic lymphocytic leukemia of B-cell type not having achieved remission: Secondary | ICD-10-CM

## 2019-08-10 NOTE — Progress Notes (Signed)
Donald [name]  [phone] [phone]  ID: Donald [name] OB: December 16, 1932  MR#: [id number]  OZD#:[id number]  Patient Care Team: Leonel Ramsay, MD as PCP - General (Infectious Diseases) Minna Merritts, MD as Consulting Physician (Cardiology)  I connected with Donald [name] on 08/10/19 at 10:15 AM EDT by video enabled telemedicine visit and verified that I am speaking with the correct person using two identifiers.   I discussed the limitations, risks, security and privacy concerns of performing an evaluation and management service by telemedicine and the availability of in-person appointments. I also discussed with the patient that there may be a patient responsible charge related to this service. The patient expressed understanding and agreed to proceed.   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   INTERVAL HISTORY: Patient agreed to video enabled telemedicine visit for further evaluation and discussion of his imaging results.  He admits to mild fatigue, but otherwise feels well and is asymptomatic. He has no neurologic complaints.  He denies any recent fevers or illnesses.  He has a good appetite and denies weight loss.  He denies any night sweats.  He has no chest pain, cough, hemoptysis, or shortness of breath.  He denies any nausea, vomiting, constipation, or diarrhea.  He has no urinary complaints.  Patient feels at his baseline offers no further specific complaints today.  Review of Systems  Constitutional: Negative.  Negative for fever, malaise/fatigue and weight loss.  Respiratory: Negative.   Cardiovascular: Negative.  Negative for chest pain and leg swelling.  Gastrointestinal: Negative.  Negative for abdominal pain.  Genitourinary:  Negative.  Negative for dysuria.  Musculoskeletal: Negative.  Negative for back pain.  Skin: Negative.  Negative for rash.  Neurological: Negative.  Negative for dizziness, focal weakness, weakness and headaches.  Psychiatric/Behavioral: Negative.  The patient is not nervous/anxious.    As per HPI. Otherwise, a complete review of systems is negative.  PAST MEDICAL HISTORY: Past Medical History:  Diagnosis Date   Anemia    Arthritis    Cancer ([place])    COPD (chronic obstructive pulmonary disease) ([facility])    Emphysema of lung ([place])    Esophageal reflux    Hyperlipidemia    Hypertension    Pneumonia    recurrent   Sebaceous cyst     PAST SURGICAL HISTORY: Past Surgical History:  Procedure Laterality Date   HEMORRHOID SURGERY     SINUSOTOMY      FAMILY HISTORY: Family History  Problem Relation Age of Onset   Heart disease Mother    Cancer Maternal Aunt        lung   Lung cancer Maternal Aunt    Leukemia Maternal Aunt     ADVANCED DIRECTIVES (Y/N):  N  HEALTH MAINTENANCE: Social History   Tobacco Use   Smoking status: Former Smoker    Packs/day: 1.50    Years: 50.00    Pack years: 75.00    Types: Cigarettes    Quit date: 09/10/1999    Years since quitting: 19.9   Smokeless tobacco: Never Used  Substance Use Topics   Alcohol use: No    Alcohol/week: 0.0 standard drinks   Drug use: No     Colonoscopy:  PAP:  Bone density:  Lipid panel:  Allergies  Allergen Reactions   Cephalexin Rash  Current Outpatient Medications  Medication Sig Dispense Refill   albuterol (PROVENTIL HFA;VENTOLIN HFA) 108 (90 Base) MCG/ACT inhaler Inhale 1-2 puffs into the lungs every 4 (four) hours as needed for wheezing or shortness of breath. 1 Inhaler 10   amLODipine (NORVASC) 5 MG tablet Take 1 tablet (5 mg total) by mouth 2 (two) times daily. 180 tablet 1   aspirin EC 81 MG tablet Take by mouth.     atorvastatin (LIPITOR) 10 MG tablet Take 1 tablet  (10 mg total) by mouth at bedtime. 90 tablet 3   budesonide-formoterol (SYMBICORT) 160-4.5 MCG/ACT inhaler Inhale 2 puffs into the lungs 2 (two) times daily. 1 Inhaler 3   cholecalciferol (VITAMIN D) 1000 UNITS tablet Take 1,000 Units by mouth daily.     dutasteride (AVODART) 0.5 MG capsule Take 1 capsule (0.5 mg total) by mouth daily. 90 capsule 1   Fluticasone-Umeclidin-Vilant (TRELEGY ELLIPTA) 100-62.5-25 MCG/INH AEPB Inhale 1 Inhaler into the lungs 1 day or 1 dose.     lisinopril (ZESTRIL) 20 MG tablet Take 1 tablet by mouth 1 day or 1 dose.     Multiple Vitamins-Minerals (MEGA MULTIVITAMIN FOR MEN) TABS Take 2 capsules by mouth daily.     nystatin (MYCOSTATIN) 100000 UNIT/ML suspension Use as directed 5 mLs (500,000 Units total) in the mouth or throat 4 (four) times daily. Retain in mouth then swallow x 7-10 days 473 mL 0   omeprazole (PRILOSEC) 20 MG capsule Take 1 capsule (20 mg total) by mouth daily. 30 capsule 6   sertraline (ZOLOFT) 50 MG tablet Take 1 tablet (50 mg total) by mouth daily. 30 tablet 3   tamsulosin (FLOMAX) 0.4 MG CAPS capsule Take 1 capsule (0.4 mg total) by mouth daily. 90 capsule 3   tiotropium (SPIRIVA HANDIHALER) 18 MCG inhalation capsule Place 1 capsule (18 mcg total) into inhaler and inhale daily. 30 capsule 0   triamcinolone cream (KENALOG) 0.1 %      valACYclovir (VALTREX) 500 MG tablet Take 1 tablet (500 mg total) by mouth every other day. 15 tablet 3   No current facility-administered medications for this visit.     OBJECTIVE: There were no vitals filed for this visit.   There is no height or weight on file to calculate BMI.    ECOG FS:0 - Asymptomatic  General: Well-developed, well-nourished, no acute distress. HEENT: Normocephalic. Neuro: Alert, answering all questions appropriately. Cranial nerves grossly intact. Psych: Normal affect.  LAB RESULTS:  Lab Results  Component Value Date   NA 140 02/24/2019   K 4.7 02/24/2019   CL 105  02/24/2019   CO2 25 02/24/2019   GLUCOSE 99 02/24/2019   BUN 38 (H) 02/24/2019   CREATININE 1.72 (H) 02/24/2019   CALCIUM 9.2 02/24/2019   PROT 6.7 12/29/2018   ALBUMIN 4.1 12/29/2018   AST 20 12/29/2018   ALT 17 12/29/2018   ALKPHOS 62 12/29/2018   BILITOT 0.3 12/29/2018   GFRNONAA 23 (L) 02/17/2019   GFRAA 27 (L) 02/17/2019    Lab Results  Component Value Date   WBC 11.4 (H) 07/30/2019   NEUTROABS 5.8 07/30/2019   HGB 10.0 (L) 07/30/2019   HCT 31.7 (L) 07/30/2019   MCV 88.1 07/30/2019   PLT 239 07/30/2019     STUDIES: Ct Chest W Contrast  Result Date: 07/27/2019 CLINICAL DATA:  Follow-up CLL EXAM: CT CHEST, ABDOMEN, AND PELVIS WITH CONTRAST TECHNIQUE: Multidetector CT imaging of the chest, abdomen and pelvis was performed following the standard protocol during bolus administration  of intravenous contrast. CONTRAST:  153m OMNIPAQUE IOHEXOL 300 MG/ML SOLN, additional oral enteric contrast COMPARISON:  CT chest abdomen pelvis 01/21/2019, 07/21/2018, PET-CT, 04/10/2018 FINDINGS: CT CHEST FINDINGS Cardiovascular: Aortic atherosclerosis. Normal heart size. Scattered three-vessel coronary artery calcifications. No pericardial effusion. Mediastinum/Nodes: There are numerous enlarged bilateral axillary and mediastinal lymph nodes, the largest pretracheal lymph node measuring 1.9 x 1.8 cm, slightly enlarged compared to prior examination at which time it measured 1.5 x 1.5 cm (series 2, image 19). The largest right axillary node measures 1.8 x 1.2 cm, slightly enlarged compared to prior examination at which time it measured 1.3 x 0.8 cm (series 2, image 20). Thyroid gland, trachea, and esophagus demonstrate no significant findings. Lungs/Pleura: Mild centrilobular emphysema. There has been interval enlargement of a lobulated pulmonary nodule or adjacent clustered nodules of the apical left upper lobe, now measuring 1.4 x 0.8 cm, previously 1.0 x 0.7 cm when measured similarly (series 3, image  28). There are additional, occasional scattered ground-glass opacities, for example in the posterior right upper lobe (series 3, image 59) and mild bibasilar scarring. No pleural effusion or pneumothorax. Musculoskeletal: No chest wall mass or suspicious bone lesions identified. CT ABDOMEN PELVIS FINDINGS Hepatobiliary: No solid liver abnormality is seen. No gallstones, gallbladder wall thickening, or biliary dilatation. Pancreas: Unremarkable. No pancreatic ductal dilatation or surrounding inflammatory changes. Spleen: Normal in size without significant abnormality. Adrenals/Urinary Tract: Adrenal glands are unremarkable. Kidneys are normal, without renal calculi, solid lesion, or hydronephrosis. Bladder is unremarkable. Stomach/Bowel: Stomach is within normal limits. Appendix appears normal. No evidence of bowel wall thickening, distention, or inflammatory changes. Descending and sigmoid diverticulosis. Vascular/Lymphatic: Aortic atherosclerosis. There are numerous prominent and enlarged retroperitoneal, iliac, and inguinal lymph nodes, the largest left inguinal nodes enlarged measuring 1.0 x 0.9 cm, previously 7 mm (series 2, image 119). The largest right iliac lymph node measures 2.1 x 1.2 cm, previously 1.9 by 1.0 cm (series 2, image 114). There are numerous enlarged small bowel mesenteric lymph nodes, not significantly changed, the largest measuring 1.8 x 1.2 cm, previously 2.0 x 1.2 cm (series 2, image 68). Reproductive: Prostatomegaly. Other: No abdominal wall hernia or abnormality. No abdominopelvic ascites. Musculoskeletal: No acute or significant osseous findings. IMPRESSION: 1. There are numerous enlarged bilateral axillary and mediastinal lymph nodes, the largest pretracheal lymph node measuring 1.9 x 1.8 cm, slightly enlarged compared to prior examination at which time it measured 1.5 x 1.5 cm (series 2, image 19). The largest right axillary node measures 1.8 x 1.2 cm, slightly enlarged compared to  prior examination at which time it measured 1.3 x 0.8 cm (series 2, image 20). 2. There are numerous prominent and enlarged retroperitoneal, iliac, and inguinal lymph nodes, the largest left inguinal nodes enlarged measuring 1.0 x 0.9 cm, previously 7 mm (series 2, image 119). The largest right iliac lymph node measures 2.1 x 1.2 cm, previously 1.9 by 1.0 cm (series 2, image 114). There are numerous enlarged small bowel mesenteric lymph nodes, not significantly changed, the largest measuring 1.8 x 1.2 cm, previously 2.0 x 1.2 cm (series 2, image 68). 3. Overall findings are consistent with slight interval worsening of lymphadenopathy. 4. There has been interval enlargement of a lobulated pulmonary nodule or adjacent clustered nodules of the apical left upper lobe, now measuring 1.4 x 0.8 cm, previously 1.0 x 0.7 cm when measured similarly (series 3, image 28). This finding has clearly increased in size on sequential prior examinations and is concerning for malignancy, although atypical infection or  inflammation may is a differential consideration. Pulmonary involvement of CLL is considered much less likely. PET-CT may be used to assess for FDG avidity and tissue sampling can be considered. 5. Additional scattered nonspecific infectious or inflammatory ground-glass opacities of the lungs. 6. Coronary artery disease. Aortic Atherosclerosis (ICD10-I70.0) and Emphysema (ICD10-J43.9). Electronically Signed   By: Eddie Candle M.D.   On: 07/27/2019 11:33   Ct Abdomen Pelvis W Contrast  Result Date: 07/27/2019 CLINICAL DATA:  Follow-up CLL EXAM: CT CHEST, ABDOMEN, AND PELVIS WITH CONTRAST TECHNIQUE: Multidetector CT imaging of the chest, abdomen and pelvis was performed following the standard protocol during bolus administration of intravenous contrast. CONTRAST:  13m OMNIPAQUE IOHEXOL 300 MG/ML SOLN, additional oral enteric contrast COMPARISON:  CT chest abdomen pelvis 01/21/2019, 07/21/2018, PET-CT, 04/10/2018  FINDINGS: CT CHEST FINDINGS Cardiovascular: Aortic atherosclerosis. Normal heart size. Scattered three-vessel coronary artery calcifications. No pericardial effusion. Mediastinum/Nodes: There are numerous enlarged bilateral axillary and mediastinal lymph nodes, the largest pretracheal lymph node measuring 1.9 x 1.8 cm, slightly enlarged compared to prior examination at which time it measured 1.5 x 1.5 cm (series 2, image 19). The largest right axillary node measures 1.8 x 1.2 cm, slightly enlarged compared to prior examination at which time it measured 1.3 x 0.8 cm (series 2, image 20). Thyroid gland, trachea, and esophagus demonstrate no significant findings. Lungs/Pleura: Mild centrilobular emphysema. There has been interval enlargement of a lobulated pulmonary nodule or adjacent clustered nodules of the apical left upper lobe, now measuring 1.4 x 0.8 cm, previously 1.0 x 0.7 cm when measured similarly (series 3, image 28). There are additional, occasional scattered ground-glass opacities, for example in the posterior right upper lobe (series 3, image 59) and mild bibasilar scarring. No pleural effusion or pneumothorax. Musculoskeletal: No chest wall mass or suspicious bone lesions identified. CT ABDOMEN PELVIS FINDINGS Hepatobiliary: No solid liver abnormality is seen. No gallstones, gallbladder wall thickening, or biliary dilatation. Pancreas: Unremarkable. No pancreatic ductal dilatation or surrounding inflammatory changes. Spleen: Normal in size without significant abnormality. Adrenals/Urinary Tract: Adrenal glands are unremarkable. Kidneys are normal, without renal calculi, solid lesion, or hydronephrosis. Bladder is unremarkable. Stomach/Bowel: Stomach is within normal limits. Appendix appears normal. No evidence of bowel wall thickening, distention, or inflammatory changes. Descending and sigmoid diverticulosis. Vascular/Lymphatic: Aortic atherosclerosis. There are numerous prominent and enlarged  retroperitoneal, iliac, and inguinal lymph nodes, the largest left inguinal nodes enlarged measuring 1.0 x 0.9 cm, previously 7 mm (series 2, image 119). The largest right iliac lymph node measures 2.1 x 1.2 cm, previously 1.9 by 1.0 cm (series 2, image 114). There are numerous enlarged small bowel mesenteric lymph nodes, not significantly changed, the largest measuring 1.8 x 1.2 cm, previously 2.0 x 1.2 cm (series 2, image 68). Reproductive: Prostatomegaly. Other: No abdominal wall hernia or abnormality. No abdominopelvic ascites. Musculoskeletal: No acute or significant osseous findings. IMPRESSION: 1. There are numerous enlarged bilateral axillary and mediastinal lymph nodes, the largest pretracheal lymph node measuring 1.9 x 1.8 cm, slightly enlarged compared to prior examination at which time it measured 1.5 x 1.5 cm (series 2, image 19). The largest right axillary node measures 1.8 x 1.2 cm, slightly enlarged compared to prior examination at which time it measured 1.3 x 0.8 cm (series 2, image 20). 2. There are numerous prominent and enlarged retroperitoneal, iliac, and inguinal lymph nodes, the largest left inguinal nodes enlarged measuring 1.0 x 0.9 cm, previously 7 mm (series 2, image 119). The largest right iliac lymph node measures 2.1 x  1.2 cm, previously 1.9 by 1.0 cm (series 2, image 114). There are numerous enlarged small bowel mesenteric lymph nodes, not significantly changed, the largest measuring 1.8 x 1.2 cm, previously 2.0 x 1.2 cm (series 2, image 68). 3. Overall findings are consistent with slight interval worsening of lymphadenopathy. 4. There has been interval enlargement of a lobulated pulmonary nodule or adjacent clustered nodules of the apical left upper lobe, now measuring 1.4 x 0.8 cm, previously 1.0 x 0.7 cm when measured similarly (series 3, image 28). This finding has clearly increased in size on sequential prior examinations and is concerning for malignancy, although atypical  infection or inflammation may is a differential consideration. Pulmonary involvement of CLL is considered much less likely. PET-CT may be used to assess for FDG avidity and tissue sampling can be considered. 5. Additional scattered nonspecific infectious or inflammatory ground-glass opacities of the lungs. 6. Coronary artery disease. Aortic Atherosclerosis (ICD10-I70.0) and Emphysema (ICD10-J43.9). Electronically Signed   By: Eddie Candle M.D.   On: 07/27/2019 11:33   Nm Pet Image Initial (pi) Skull Base To Thigh  Result Date: 08/05/2019 CLINICAL DATA:  Subsequent treatment strategy for CLL. Enlarging apical left upper lobe pulmonary nodule. EXAM: NUCLEAR MEDICINE PET SKULL BASE TO THIGH TECHNIQUE: 12.2 mCi F-18 FDG was injected intravenously. Full-ring PET imaging was performed from the skull base to thigh after the radiotracer. CT data was obtained and used for attenuation correction and anatomic localization. Fasting blood glucose: 105 mg/dl COMPARISON:  04/10/2018 PET-CT. 07/27/2019 CT chest, abdomen and pelvis. FINDINGS: Mediastinal blood pool activity: SUV max 2.3 Liver activity: SUV max 4.2 NECK: No hypermetabolic lymph nodes in the neck. Incidental CT findings: none CHEST: Apical left upper lobe subsolid 1.5 cm pulmonary nodule (series 3/image 75) is newly hypermetabolic with max SUV 4.4, increased in size from 0.4 cm on 04/10/2018 PET-CT. No additional hypermetabolic pulmonary findings. Mildly enlarged bilateral axillary lymph nodes are not appreciably changed in size since 04/10/2018 PET-CT and are non hypermetabolic. Representative 1.0 cm left axillary node with max SUV 1.5 (series 3/image 82), previously 1.2 cm with max SUV 1.7. Mildly enlarged and mildly hypermetabolic 1.8 cm right paratracheal node with max SUV 3.0 (series 3/image 86), previously 1.9 cm with max SUV 3.3, slightly decreased in size and metabolism. Mildly enlarged and mildly hypermetabolic 1.9 cm subcarinal node with max SUV 2.8  (series 3/image 100), previously 2.1 cm with max SUV 3.4, slightly decreased in size and metabolism. No new hypermetabolic mediastinal or hilar lymph nodes. Incidental CT findings: Coronary atherosclerosis. Atherosclerotic nonaneurysmal thoracic aorta. Mild centrilobular emphysema. ABDOMEN/PELVIS: Top-normal size mesenteric lymph nodes with low level uptake are minimally decreased in size and metabolism. Representative 0.8 cm central mesenteric node with max SUV 2.4 (series 3/image 176), previously 1.1 cm with max SUV 2.7. Top-normal size 0.9 cm right external iliac node with low level uptake with max SUV 1.8 (series 3/image 241), previously 1.0 cm with max SUV 2.2, slightly decreased in size and metabolism. Top-normal size 1.0 cm left inguinal node is mildly hypermetabolic with max SUV 3.0 (series 3/image 253), previously 0.8 cm with max SUV 2.5, minimally increased in size and metabolism. No abnormal hypermetabolic activity within the liver, pancreas, adrenal glands, or spleen. Incidental CT findings: Small hiatal hernia. Atherosclerotic nonaneurysmal abdominal aorta. Moderate left colonic diverticulosis. SKELETON: No focal hypermetabolic activity to suggest skeletal metastasis. Incidental CT findings: none IMPRESSION: 1. Subsolid apical left upper lobe 1.5 cm pulmonary nodule is newly hypermetabolic (max SUV 4.4) and increased in size  since 04/10/2018 PET-CT. Findings are worrisome for a slowly enlarging primary bronchogenic adenocarcinoma. No new hypermetabolic thoracic or extrathoracic findings to suggest metastatic disease due to this suspected primary bronchogenic carcinoma. 2. Otherwise relatively stable mildly hypermetabolic mediastinal, mesenteric and bilateral pelvic lymphadenopathy since 04/10/2018 PET-CT, with minimal mixed metabolic changes as detailed, compatible with chronic lymphoproliferative disorder. 3. Aortic Atherosclerosis (ICD10-I70.0) and Emphysema (ICD10-J43.9). Additional chronic findings  as detailed. Electronically Signed   By: Ilona Sorrel M.D.   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  CT scan results from July 27, 2019 reviewed independently and report as above with moderately progressive lymphadenopathy in bilateral axilla, mediastinal, retroperitoneal, iliac, and inguinal lymph nodes.    PET scan results from August 05, 2019 with minimal metabolic changes to known lymphadenopathy compatible with a chronic low-grade lymphoproliferative disorder.  Previously, his flow cytometry panel reported a CD5+ and CD23+ clonal B-cell population, highly suspicious for CLL/SLL.  Patient does not require biopsy, but will consider one if patient required treatment.  He does not require bone marrow biopsy.   2.  Left upper lobe pulmonary nodule: Increased in size from 1 cm to 1.4 cm.  PET scan results from August 05, 2019 reviewed independently and reported as above with PET positive left upper lobe pulmonary nodule consistent with evolving bronchogenic carcinoma.  Case discussed with pulmonology and patient will undergo navigational bronchoscopy in the near future to obtain a diagnosis.  Patient will then return to clinic 1 week after his biopsy for further evaluation as well as consultation with radiation oncology.   3.  Anemia: Mild, monitor.  I provided 25 minutes of face-to-face video visit time during this encounter, and > 50% was spent counseling as documented under my assessment & plan.   Patient expressed understanding and was in agreement with this plan. He also understands that He can call clinic at any time with any questions, concerns, or complaints.   Cancer Staging No matching staging information was found for the patient.  Lloyd Huger, MD   08/10/2019 10:54 AM

## 2019-08-12 ENCOUNTER — Other Ambulatory Visit: Payer: Self-pay

## 2019-08-12 ENCOUNTER — Encounter: Payer: Self-pay | Admitting: Pulmonary Disease

## 2019-08-12 ENCOUNTER — Ambulatory Visit (INDEPENDENT_AMBULATORY_CARE_PROVIDER_SITE_OTHER): Payer: Medicare Other | Admitting: Pulmonary Disease

## 2019-08-12 VITALS — BP 132/80 | HR 60 | Temp 98.1°F | Ht 68.0 in | Wt 224.2 lb

## 2019-08-12 DIAGNOSIS — R0609 Other forms of dyspnea: Secondary | ICD-10-CM

## 2019-08-12 DIAGNOSIS — J449 Chronic obstructive pulmonary disease, unspecified: Secondary | ICD-10-CM | POA: Diagnosis not present

## 2019-08-12 DIAGNOSIS — R591 Generalized enlarged lymph nodes: Secondary | ICD-10-CM

## 2019-08-12 DIAGNOSIS — R911 Solitary pulmonary nodule: Secondary | ICD-10-CM | POA: Diagnosis not present

## 2019-08-12 DIAGNOSIS — R0600 Dyspnea, unspecified: Secondary | ICD-10-CM

## 2019-08-12 MED ORDER — ALBUTEROL SULFATE HFA 108 (90 BASE) MCG/ACT IN AERS: 2.0000 | INHALATION_SPRAY | Freq: Four times a day (QID) | RESPIRATORY_TRACT | 6 refills | Status: AC | PRN

## 2019-08-12 NOTE — Patient Instructions (Addendum)
1.  We are scheduling your procedure   2.  We are also scheduling oxygen measurement during sleep  3.  Continue Trelegy, we have sent also prescription for your rescue inhaler  4.  We will see you in follow-up a week after the procedure.

## 2019-08-13 ENCOUNTER — Telehealth: Payer: Self-pay | Admitting: Pulmonary Disease

## 2019-08-13 NOTE — Telephone Encounter (Signed)
08/13/2019 at 10:32 am EST spoke with Apolonio Schneiders at [facility]. Procedure codes [phone], [phone] and [id number] are Valid and Billable codes which does not require PA. Call Ref # [id number]. Rhonda J Cobb

## 2019-08-13 NOTE — Telephone Encounter (Signed)
Nav and EBUS is scheduled for 08/19/2019 at 1:00p. OV:PCHE upper lob lesion and mediastinal adenopathy. CPT: [phone]

## 2019-08-16 NOTE — Telephone Encounter (Signed)
Left message for pt

## 2019-08-17 ENCOUNTER — Telehealth: Payer: Self-pay | Admitting: *Deleted

## 2019-08-17 ENCOUNTER — Encounter: Payer: Self-pay | Admitting: Pulmonary Disease

## 2019-08-17 NOTE — Telephone Encounter (Signed)
We did not schedule the biopsy.  Patient should be returning Margie's call, at pulmonology, she left a message for pt to return her call yesterday.   Lesleigh Noe, will you call patient to clarify biopsy information?

## 2019-08-17 NOTE — Telephone Encounter (Signed)
[facility] mobile number on file- unable to leave vm due to mailbox not being set up. [facility] home number on file- Line rang for >36min and then busy dial.  EC does not have mailbox setup either, therefore I was unable to leave voicemail.

## 2019-08-17 NOTE — Telephone Encounter (Signed)
PAT has been scheduled 08/18/2019 at 1:15.  Pt is aware and voiced his understanding.

## 2019-08-17 NOTE — Progress Notes (Signed)
Subjective:    Patient ID: Donald [name], male    DOB: [birth year], 85 y.o.   MRN: [id number]  HPI Donald [name] is an 85 year old remote former smoker (quit 2000) who has been a patient of Dr. Gwendolyn Lima.  Patient was previously evaluated here for dyspnea on exertion out of proportion to his very mild obstructive defect on PFTs.  Patient had pulmonary function testing performed in September 2019 showed very mild obstruction by FEV1/FVC criteria.  His FEV1 was 2.44 L or 95% predicted and FVC was 3.69 L or 110% predicted.  His FEV1 FVC was 66%.  Diffusion capacity was mildly decreased at 68%.  He had a 2D echo on February 2020 that showed some diastolic dysfunction and mild enlargement of the left atrium and right atrium.  The patient was also noted to have mild mitral and tricuspid regurgitation.  He has had exercise myocardial perfusion imaging that showed no inducible ischemia.  By Dr. Jamal Collin prior notes, his working diagnosis was that of deconditioning and that the patient would benefit from modest weight loss and reconditioning program.  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  He is being carefully monitored in this regard by Dr. Delight Hoh.  Dr. Grayland Ormond has asked [name] to see the patient again, as he has been established in this clinic since 2014, due to increasing size of his lymphadenopathy in the mediastinum and his left upper lobe nodule.  Previously it was not believed that he would require therapy for this however now this is in question.  The patient will need to have a biopsy to evaluate this issue.   I have reviewed the patient's medical records.  I have seen and examined the patient.  He does not endorse any weight loss or anorexia.  No fevers, chills or sweats.  No malaise or fatigue.  He continues to have some issues with mild dyspnea on exertion that has had negative work-up as above.  He has  not had any cough or sputum production and no hemoptysis.  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  Continues follow-up with the [facility] as well.  I have independently reviewed the patient's CT chest performed on 15 September which showed enlarged axillary and mediastinal lymph nodes.  There has been enlargement of the mediastinal lymph nodes when compared to prior.  In addition there is a lobulated pulmonary nodule in the LEFT upper lobe chest also increased in size since the prior CT of March 2020.  A PET/CT was performed on 24 September which shows enlarging and mildly hypermetabolic lymph nodes on the RIGHT paratracheal and subcarinal areas.  In addition the LEFT pulmonary nodule has continued to grow is 1.5 cm as compared to previous and it had a max SUV of 4.4 showing significant intensity.  Previously the patient had been referred for percutaneous biopsy of this nodule however, due to its location the biopsy was canceled.  I have discussed the possibilities for biopsy with the patient and have recommended bronchoscopy with navigational assistance for the LEFT upper lung nodule and bronchoscopy with endobronchial ultrasound for evaluation of the RIGHT paratracheal node and the subcarinal area (medial).  The patient understands that this procedure will be done under general anesthesia and he agrees to proceed.  The patient and his wife had opportunity to ask questions with  regards to the procedure.  These were answered to their satisfaction.  Review of Systems  Constitutional: Negative.   HENT: Negative.   Eyes: Negative.   Respiratory: Positive for shortness of breath (Chronic, no change in character).   Cardiovascular: Negative.   Gastrointestinal: Negative.   Endocrine: Negative.   Genitourinary: Negative.   Musculoskeletal: Negative.   Skin: Negative.   Allergic/Immunologic: Negative.   Neurological: Negative.    Hematological: Positive for adenopathy (Axillary).  Psychiatric/Behavioral: Negative.   All other systems reviewed and are negative.      Objective:   Physical Exam Vitals signs and nursing note reviewed.  Constitutional:      General: He is not in acute distress.    Appearance: Normal appearance. He is obese.  HENT:     Head: Normocephalic and atraumatic.     Right Ear: External ear normal.     Left Ear: External ear normal.     Nose:     Comments: Nose/mouth/throat not examined due to masking requirements for COVID 19. Eyes:     General: No scleral icterus.    Conjunctiva/sclera: Conjunctivae normal.     Pupils: Pupils are equal, round, and reactive to light.  Neck:     Musculoskeletal: Neck supple.     Thyroid: No thyromegaly.     Trachea: Trachea and phonation normal.  Cardiovascular:     Rate and Rhythm: Normal rate and regular rhythm.     Pulses: Normal pulses.     Heart sounds: Normal heart sounds. No murmur.  Pulmonary:     Effort: Pulmonary effort is normal.     Breath sounds: Normal breath sounds. No wheezing, rhonchi or rales.  Abdominal:     General: Abdomen is protuberant. There is no distension.  Musculoskeletal: Normal range of motion.     Right lower leg: 1+ Pitting Edema present.     Left lower leg: 1+ Pitting Edema present.  Lymphadenopathy:     Cervical: No cervical adenopathy.     Upper Body:     Right upper body: Axillary adenopathy present.     Left upper body: Axillary adenopathy present.  Skin:    General: Skin is warm and dry.  Neurological:     General: No focal deficit present.     Mental Status: He is alert and oriented to person, place, and time.  Psychiatric:        Mood and Affect: Mood normal.        Behavior: Behavior normal.     Representative images      Subcarinal adenopathy:   PET/CT:     Assessment & Plan:    1.  LEFT upper lobe lung nodule: This lesion is FDG avid.  Does not appear to be CLL involvement in  the lung, biopsy necessary to try to delineate best possible therapy for the patient.  The lesion is growing and appears to have change from no activity to now activity noted on PET/CT.  This is indeed concerning for a primary lung cancer.  The patient had the biopsy options described to him.  Percutaneous biopsy is not feasible due to the location of the lesion and increased danger for pneumothorax and injury to adjacent vessels.  The patient is not enthusiastic about thoracoscopic biopsy.  The patient had navigational bronchoscopy described and he would like to proceed with this option.  Benefits, limitations and potential complications of the procedure were discussed with the patient/family  including, but not limited to bleeding, hemoptysis, respiratory  failure requiring intubation and/or prolongued mechanical ventilation, infection, pneumothorax (collapse of lung) requiring chest tube placement, stroke from air embolism or even death.  He agrees to go ahead.  It has been tentatively scheduled for October 12.  2.  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  The patient understands the rationale for this and agrees to proceed with this as well.  3.  COPD, very mild: By pulmonary function testing performed on September 2019 the patient's FEV1 is only mildly reduced.  Continue current inhaler therapy.  Will obtain overnight oximetry to evaluate for potential nocturnal oxygen desaturations.  Patient requested albuterol refill and this was provided for him.  4.  Dyspnea on exertion: Mostly due to deconditioning, patient has had negative pulmonary and cardiac work-up so far.  Patient is up-to-date on flu vaccine.  Total time of this visit 42 minutes.  This chart was dictated using voice recognition software/Dragon.  Despite best  efforts to proofread, errors can occur which can change the meaning.  Any change was purely unintentional.

## 2019-08-17 NOTE — Telephone Encounter (Addendum)
Pt is aware of date/time of bronch and covid test.  Pt has not been scheduled for pre admit testing as of yet. Message has been sent to PAT regarding scheduling pt.  Pt is aware that I will contact him once pre admit has been scheduled.

## 2019-08-17 NOTE — Telephone Encounter (Signed)
Patient called reporting that he "was to have a biopsy on 10/9, but was then told that wasn't going to happen and that tentatively would be on 10/12, but no one has let me know" He seemed a bit upset that he does not know when he is to come in for biopsy and would like a call to let him know when he is to have his biopsy. Please return his call [phone]

## 2019-08-17 NOTE — H&P (View-Only) (Signed)
Subjective:    Patient ID: Donald [name], male    DOB: 05-15-1933, 85 y.o.   MRN: [id number]  HPI Donald [name] is an 85 year old remote former smoker (quit 2000) who has been a patient of Dr. Gwendolyn Lima.  Patient was previously evaluated here for dyspnea on exertion out of proportion to his very mild obstructive defect on PFTs.  Patient had pulmonary function testing performed in September 2019 showed very mild obstruction by FEV1/FVC criteria.  His FEV1 was 2.44 L or 95% predicted and FVC was 3.69 L or 110% predicted.  His FEV1 FVC was 66%.  Diffusion capacity was mildly decreased at 68%.  He had a 2D echo on February 2020 that showed some diastolic dysfunction and mild enlargement of the left atrium and right atrium.  The patient was also noted to have mild mitral and tricuspid regurgitation.  He has had exercise myocardial perfusion imaging that showed no inducible ischemia.  By Dr. Jamal Collin prior notes, his working diagnosis was that of deconditioning and that the patient would benefit from modest weight loss and reconditioning program.  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  He is being carefully monitored in this regard by Dr. Delight Hoh.  Dr. Grayland Ormond has asked [name] to see the patient again, as he has been established in this clinic since 2014, due to increasing size of his lymphadenopathy in the mediastinum and his left upper lobe nodule.  Previously it was not believed that he would require therapy for this however now this is in question.  The patient will need to have a biopsy to evaluate this issue.   I have reviewed the patient's medical records.  I have seen and examined the patient.  He does not endorse any weight loss or anorexia.  No fevers, chills or sweats.  No malaise or fatigue.  He continues to have some issues with mild dyspnea on exertion that has had negative work-up as above.  He has  not had any cough or sputum production and no hemoptysis.  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  Continues follow-up with the [name] as well.  I have independently reviewed the patient's CT chest performed on 15 September which showed enlarged axillary and mediastinal lymph nodes.  There has been enlargement of the mediastinal lymph nodes when compared to prior.  In addition there is a lobulated pulmonary nodule in the LEFT upper lobe chest also increased in size since the prior CT of March 2020.  A PET/CT was performed on 24 September which shows enlarging and mildly hypermetabolic lymph nodes on the RIGHT paratracheal and subcarinal areas.  In addition the LEFT pulmonary nodule has continued to grow is 1.5 cm as compared to previous and it had a max SUV of 4.4 showing significant intensity.  Previously the patient had been referred for percutaneous biopsy of this nodule however, due to its location the biopsy was canceled.  I have discussed the possibilities for biopsy with the patient and have recommended bronchoscopy with navigational assistance for the LEFT upper lung nodule and bronchoscopy with endobronchial ultrasound for evaluation of the RIGHT paratracheal node and the subcarinal area (medial).  The patient understands that this procedure will be done under general anesthesia and he agrees to proceed.  The patient and his wife had opportunity to ask questions with  regards to the procedure.  These were answered to their satisfaction.  Review of Systems  Constitutional: Negative.   HENT: Negative.   Eyes: Negative.   Respiratory: Positive for shortness of breath (Chronic, no change in character).   Cardiovascular: Negative.   Gastrointestinal: Negative.   Endocrine: Negative.   Genitourinary: Negative.   Musculoskeletal: Negative.   Skin: Negative.   Allergic/Immunologic: Negative.   Neurological: Negative.    Hematological: Positive for adenopathy (Axillary).  Psychiatric/Behavioral: Negative.   All other systems reviewed and are negative.      Objective:   Physical Exam Vitals signs and nursing note reviewed.  Constitutional:      General: He is not in acute distress.    Appearance: Normal appearance. He is obese.  HENT:     Head: Normocephalic and atraumatic.     Right Ear: External ear normal.     Left Ear: External ear normal.     Nose:     Comments: Nose/mouth/throat not examined due to masking requirements for COVID 19. Eyes:     General: No scleral icterus.    Conjunctiva/sclera: Conjunctivae normal.     Pupils: Pupils are equal, round, and reactive to light.  Neck:     Musculoskeletal: Neck supple.     Thyroid: No thyromegaly.     Trachea: Trachea and phonation normal.  Cardiovascular:     Rate and Rhythm: Normal rate and regular rhythm.     Pulses: Normal pulses.     Heart sounds: Normal heart sounds. No murmur.  Pulmonary:     Effort: Pulmonary effort is normal.     Breath sounds: Normal breath sounds. No wheezing, rhonchi or rales.  Abdominal:     General: Abdomen is protuberant. There is no distension.  Musculoskeletal: Normal range of motion.     Right lower leg: 1+ Pitting Edema present.     Left lower leg: 1+ Pitting Edema present.  Lymphadenopathy:     Cervical: No cervical adenopathy.     Upper Body:     Right upper body: Axillary adenopathy present.     Left upper body: Axillary adenopathy present.  Skin:    General: Skin is warm and dry.  Neurological:     General: No focal deficit present.     Mental Status: He is alert and oriented to person, place, and time.  Psychiatric:        Mood and Affect: Mood normal.        Behavior: Behavior normal.     Representative images      Subcarinal adenopathy:   PET/CT:     Assessment & Plan:    1.  LEFT upper lobe lung nodule: This lesion is FDG avid.  Does not appear to be CLL involvement in  the lung, biopsy necessary to try to delineate best possible therapy for the patient.  The lesion is growing and appears to have change from no activity to now activity noted on PET/CT.  This is indeed concerning for a primary lung cancer.  The patient had the biopsy options described to him.  Percutaneous biopsy is not feasible due to the location of the lesion and increased danger for pneumothorax and injury to adjacent vessels.  The patient is not enthusiastic about thoracoscopic biopsy.  The patient had navigational bronchoscopy described and he would like to proceed with this option.  Benefits, limitations and potential complications of the procedure were discussed with the patient/family  including, but not limited to bleeding, hemoptysis, respiratory  failure requiring intubation and/or prolongued mechanical ventilation, infection, pneumothorax (collapse of lung) requiring chest tube placement, stroke from air embolism or even death.  He agrees to go ahead.  It has been tentatively scheduled for October 12.  2.  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  The patient understands the rationale for this and agrees to proceed with this as well.  3.  COPD, very mild: By pulmonary function testing performed on September 2019 the patient's FEV1 is only mildly reduced.  Continue current inhaler therapy.  Will obtain overnight oximetry to evaluate for potential nocturnal oxygen desaturations.  Patient requested albuterol refill and this was provided for him.  4.  Dyspnea on exertion: Mostly due to deconditioning, patient has had negative pulmonary and cardiac work-up so far.  Patient is up-to-date on flu vaccine.  Total time of this visit 42 minutes.  This chart was dictated using voice recognition software/Dragon.  Despite best  efforts to proofread, errors can occur which can change the meaning.  Any change was purely unintentional.

## 2019-08-17 NOTE — Telephone Encounter (Signed)
Spoke to pt, he is aware of date/time of procedure and covid test.

## 2019-08-18 ENCOUNTER — Other Ambulatory Visit: Admission: RE | Admit: 2019-08-18 | Payer: Medicare Other | Source: Ambulatory Visit

## 2019-08-18 ENCOUNTER — Encounter
Admission: RE | Admit: 2019-08-18 | Discharge: 2019-08-18 | Disposition: A | Discharge status: Home / Self Care | Payer: Medicare Other | Source: Ambulatory Visit | Attending: Pulmonary Disease | Admitting: Pulmonary Disease

## 2019-08-18 ENCOUNTER — Other Ambulatory Visit: Payer: Self-pay

## 2019-08-18 DIAGNOSIS — Z01818 Encounter for other preprocedural examination: Secondary | ICD-10-CM | POA: Insufficient documentation

## 2019-08-18 DIAGNOSIS — Z20828 Contact with and (suspected) exposure to other viral communicable diseases: Secondary | ICD-10-CM | POA: Diagnosis not present

## 2019-08-18 DIAGNOSIS — I10 Essential (primary) hypertension: Secondary | ICD-10-CM | POA: Diagnosis not present

## 2019-08-18 HISTORY — DX: Urge incontinence: N39.41

## 2019-08-18 HISTORY — DX: Shortness of breath: R06.02

## 2019-08-18 HISTORY — DX: Transient cerebral ischemic attack, unspecified: G45.9

## 2019-08-18 HISTORY — DX: Occlusion and stenosis of right carotid artery: I65.21

## 2019-08-18 HISTORY — DX: Post-traumatic stress disorder, unspecified: F43.10

## 2019-08-18 HISTORY — DX: Benign prostatic hyperplasia without lower urinary tract symptoms: N40.0

## 2019-08-18 HISTORY — DX: Unspecified mononeuropathy of bilateral lower limbs: G57.93

## 2019-08-18 LAB — BASIC METABOLIC PANEL
Anion gap: 9 (ref 5–15)
BUN: 27 mg/dL — ABNORMAL HIGH (ref 8–23)
CO2: 23 mmol/L (ref 22–32)
Calcium: 9.5 mg/dL (ref 8.9–10.3)
Chloride: 106 mmol/L (ref 98–111)
Creatinine, Ser: 1.61 mg/dL — ABNORMAL HIGH (ref 0.61–1.24)
GFR calc Af Amer: 45 mL/min — ABNORMAL LOW (ref >60)
GFR calc non Af Amer: 39 mL/min — ABNORMAL LOW (ref >60)
Glucose, Bld: 128 mg/dL — ABNORMAL HIGH (ref 70–99)
Potassium: 4.9 mmol/L (ref 3.5–5.1)
Sodium: 138 mmol/L (ref 135–145)

## 2019-08-18 LAB — CBC
HCT: 30.6 % — ABNORMAL LOW (ref 39.0–52.0)
Hemoglobin: 9.6 g/dL — ABNORMAL LOW (ref 13.0–17.0)
MCH: 27.4 pg (ref 26.0–34.0)
MCHC: 31.4 g/dL (ref 30.0–36.0)
MCV: 87.4 fL (ref 80.0–100.0)
Platelets: 214 10*3/uL (ref 150–400)
RBC: 3.5 MIL/uL — ABNORMAL LOW (ref 4.22–5.81)
RDW: 14.7 % (ref 11.5–15.5)
WBC: 11.8 10*3/uL — ABNORMAL HIGH (ref 4.0–10.5)
nRBC: 0 % (ref 0.0–0.2)

## 2019-08-18 NOTE — Patient Instructions (Signed)
Your procedure is scheduled on: Monday, August 23, 2019 Report to Day Surgery on the 2nd floor of the [facility]. To find out your arrival time, please call [phone] between 1PM - 3PM on: Friday, October 9  REMEMBER: Instructions that are not followed completely may result in serious medical risk, up to and including death; or upon the discretion of your surgeon and anesthesiologist your surgery may need to be rescheduled.  Do not eat food after midnight the night before surgery.  No gum chewing, lozengers or hard candies.  You may however, drink CLEAR liquids up to 2 hours before you are scheduled to arrive for your surgery. Do not drink anything within 2 hours of the start of your surgery.  Clear liquids include: - water  - apple juice without pulp - gatorade - black coffee or tea (Do NOT add milk or creamers to the coffee or tea) Do NOT drink anything that is not on this list.  No Alcohol for 24 hours before or after surgery.  No Smoking including e-cigarettes for 24 hours prior to surgery.  No chewable tobacco products for at least 6 hours prior to surgery.  No nicotine patches on the day of surgery.  On the morning of surgery brush your teeth with toothpaste and water, you may rinse your mouth with mouthwash if you wish. Do not swallow any toothpaste or mouthwash.  Notify your doctor if there is any change in your medical condition (cold, fever, infection).  Do not wear jewelry, make-up, hairpins, clips or nail polish.  Do not wear lotions, powders, or perfumes.   Do not shave 48 hours prior to surgery.   Contacts and dentures may not be worn into surgery.  Do not bring valuables to the hospital, including drivers license, insurance or credit cards.  [facility] is not responsible for any belongings or valuables.   TAKE THESE MEDICATIONS THE MORNING OF SURGERY:  1.  Albuterol inhaler 2.  Amlodipine 3.  Omeprazole - (take one the night before and one on the  morning of surgery - helps to prevent nausea after surgery.)  Use inhalers on the day of surgery and bring to the hospital.  NOW!  Stop ASPIRIN AND Anti-inflammatories (NSAIDS) such as Advil, Aleve, Ibuprofen, Motrin, Naproxen, Naprosyn and Aspirin based products such as Excedrin, Goodys Powder, BC Powder. (May take Tylenol or Acetaminophen if needed.)  Stop ANY OVER THE COUNTER supplements until after surgery. (May continue Vitamin D and multivitamin.)  If you are being discharged the day of surgery, you will not be allowed to drive home. You will need a responsible adult to drive you home and stay with you that night.   If you are taking public transportation, you will need to have a responsible adult with you. Please confirm with your physician that it is acceptable to use public transportation.   Please call [phone] if you have any questions about these instructions.

## 2019-08-19 ENCOUNTER — Other Ambulatory Visit: Admission: RE | Admit: 2019-08-19 | Payer: Medicare Other | Source: Ambulatory Visit

## 2019-08-19 LAB — SARS CORONAVIRUS 2 (TAT 6-24 HRS): SARS Coronavirus 2: NEGATIVE

## 2019-08-19 NOTE — Progress Notes (Signed)
Pre-Admit Testing Provider Notification Note  Provider Notified: Dr. Patsey Berthold  Notification Mode: Fax  Reason: Abnormal lab results.  Response: Fax confirmation received.  Additional Information: Placed on chart.  Signed: Beulah Gandy, RN

## 2019-08-23 ENCOUNTER — Ambulatory Visit: Payer: Medicare Other

## 2019-08-23 ENCOUNTER — Encounter: Payer: Self-pay | Admitting: *Deleted

## 2019-08-23 ENCOUNTER — Ambulatory Visit: Payer: Medicare Other | Admitting: Anesthesiology

## 2019-08-23 ENCOUNTER — Encounter
Admission: RE | Disposition: A | Discharge status: Home / Self Care | Payer: Self-pay | Source: Home / Self Care | Attending: Pulmonary Disease

## 2019-08-23 ENCOUNTER — Ambulatory Visit
Admission: RE | Admit: 2019-08-23 | Discharge: 2019-08-23 | Disposition: A | Discharge status: Home / Self Care | Payer: Medicare Other | Attending: Pulmonary Disease | Admitting: Pulmonary Disease

## 2019-08-23 DIAGNOSIS — I10 Essential (primary) hypertension: Secondary | ICD-10-CM | POA: Insufficient documentation

## 2019-08-23 DIAGNOSIS — J449 Chronic obstructive pulmonary disease, unspecified: Secondary | ICD-10-CM | POA: Diagnosis not present

## 2019-08-23 DIAGNOSIS — E1151 Type 2 diabetes mellitus with diabetic peripheral angiopathy without gangrene: Secondary | ICD-10-CM | POA: Diagnosis not present

## 2019-08-23 DIAGNOSIS — R911 Solitary pulmonary nodule: Secondary | ICD-10-CM

## 2019-08-23 DIAGNOSIS — E119 Type 2 diabetes mellitus without complications: Secondary | ICD-10-CM | POA: Diagnosis not present

## 2019-08-23 DIAGNOSIS — R590 Localized enlarged lymph nodes: Secondary | ICD-10-CM

## 2019-08-23 DIAGNOSIS — Z87891 Personal history of nicotine dependence: Secondary | ICD-10-CM | POA: Insufficient documentation

## 2019-08-23 DIAGNOSIS — Z6834 Body mass index (BMI) 34.0-34.9, adult: Secondary | ICD-10-CM | POA: Insufficient documentation

## 2019-08-23 DIAGNOSIS — N403 Nodular prostate with lower urinary tract symptoms: Secondary | ICD-10-CM | POA: Insufficient documentation

## 2019-08-23 DIAGNOSIS — Z9889 Other specified postprocedural states: Secondary | ICD-10-CM

## 2019-08-23 DIAGNOSIS — E669 Obesity, unspecified: Secondary | ICD-10-CM | POA: Insufficient documentation

## 2019-08-23 DIAGNOSIS — E7849 Other hyperlipidemia: Secondary | ICD-10-CM | POA: Insufficient documentation

## 2019-08-23 DIAGNOSIS — K219 Gastro-esophageal reflux disease without esophagitis: Secondary | ICD-10-CM | POA: Insufficient documentation

## 2019-08-23 DIAGNOSIS — Z881 Allergy status to other antibiotic agents status: Secondary | ICD-10-CM | POA: Insufficient documentation

## 2019-08-23 DIAGNOSIS — F419 Anxiety disorder, unspecified: Secondary | ICD-10-CM | POA: Diagnosis not present

## 2019-08-23 DIAGNOSIS — Z8673 Personal history of transient ischemic attack (TIA), and cerebral infarction without residual deficits: Secondary | ICD-10-CM | POA: Insufficient documentation

## 2019-08-23 DIAGNOSIS — M1990 Unspecified osteoarthritis, unspecified site: Secondary | ICD-10-CM | POA: Diagnosis not present

## 2019-08-23 DIAGNOSIS — Z856 Personal history of leukemia: Secondary | ICD-10-CM | POA: Diagnosis not present

## 2019-08-23 HISTORY — PX: ENDOBRONCHIAL ULTRASOUND: SHX5096

## 2019-08-23 HISTORY — PX: ELECTROMAGNETIC NAVIGATION BROCHOSCOPY: SHX5369

## 2019-08-23 SURGERY — ELECTROMAGNETIC NAVIGATION BRONCHOSCOPY
Anesthesia: General

## 2019-08-23 MED ORDER — SODIUM CHLORIDE 0.9 % IV SOLN: Freq: Once | INTRAVENOUS | Status: DC

## 2019-08-23 MED ORDER — DEXAMETHASONE SODIUM PHOSPHATE 10 MG/ML IJ SOLN
INTRAMUSCULAR | Status: DC | PRN
  Administered 2019-08-23: 6 mg via INTRAVENOUS

## 2019-08-23 MED ORDER — SEVOFLURANE IN SOLN
RESPIRATORY_TRACT | Status: AC
  Filled 2019-08-23: qty 250

## 2019-08-23 MED ORDER — ONDANSETRON HCL 4 MG/2ML IJ SOLN: 4.0000 mg | Freq: Once | INTRAMUSCULAR | Status: DC | PRN

## 2019-08-23 MED ORDER — LIDOCAINE HCL (CARDIAC) PF 100 MG/5ML IV SOSY
PREFILLED_SYRINGE | INTRAVENOUS | Status: DC | PRN
  Administered 2019-08-23: 60 mg via INTRAVENOUS

## 2019-08-23 MED ORDER — SUGAMMADEX SODIUM 200 MG/2ML IV SOLN
INTRAVENOUS | Status: DC | PRN
  Administered 2019-08-23: 200 mg via INTRAVENOUS

## 2019-08-23 MED ORDER — DEXAMETHASONE SODIUM PHOSPHATE 10 MG/ML IJ SOLN
INTRAMUSCULAR | Status: AC
  Filled 2019-08-23: qty 1

## 2019-08-23 MED ORDER — FENTANYL CITRATE (PF) 100 MCG/2ML IJ SOLN: 25.0000 ug | INTRAMUSCULAR | Status: DC | PRN

## 2019-08-23 MED ORDER — SUCCINYLCHOLINE CHLORIDE 20 MG/ML IJ SOLN
INTRAMUSCULAR | Status: AC
  Filled 2019-08-23: qty 1

## 2019-08-23 MED ORDER — ROCURONIUM BROMIDE 100 MG/10ML IV SOLN
INTRAVENOUS | Status: DC | PRN
  Administered 2019-08-23: 15 mg via INTRAVENOUS
  Administered 2019-08-23: 10 mg via INTRAVENOUS
  Administered 2019-08-23: 5 mg via INTRAVENOUS

## 2019-08-23 MED ORDER — SUCCINYLCHOLINE CHLORIDE 20 MG/ML IJ SOLN
INTRAMUSCULAR | Status: DC | PRN
  Administered 2019-08-23: 100 mg via INTRAVENOUS

## 2019-08-23 MED ORDER — ONDANSETRON HCL 4 MG/2ML IJ SOLN
INTRAMUSCULAR | Status: DC | PRN
  Administered 2019-08-23: 4 mg via INTRAVENOUS

## 2019-08-23 MED ORDER — SUGAMMADEX SODIUM 200 MG/2ML IV SOLN
INTRAVENOUS | Status: AC
  Filled 2019-08-23: qty 2

## 2019-08-23 MED ORDER — FENTANYL CITRATE (PF) 100 MCG/2ML IJ SOLN
INTRAMUSCULAR | Status: DC | PRN
  Administered 2019-08-23: 50 ug via INTRAVENOUS

## 2019-08-23 MED ORDER — ONDANSETRON HCL 4 MG/2ML IJ SOLN
INTRAMUSCULAR | Status: AC
  Filled 2019-08-23: qty 2

## 2019-08-23 MED ORDER — BUTAMBEN-TETRACAINE-BENZOCAINE 2-2-14 % EX AERO
1.0000 | INHALATION_SPRAY | Freq: Once | CUTANEOUS | Status: DC
  Filled 2019-08-23: qty 20

## 2019-08-23 MED ORDER — PROPOFOL 10 MG/ML IV BOLUS
INTRAVENOUS | Status: DC | PRN
  Administered 2019-08-23: 130 mg via INTRAVENOUS
  Administered 2019-08-23: 30 mg via INTRAVENOUS

## 2019-08-23 MED ORDER — PROPOFOL 10 MG/ML IV BOLUS
INTRAVENOUS | Status: AC
  Filled 2019-08-23: qty 20

## 2019-08-23 MED ORDER — FENTANYL CITRATE (PF) 100 MCG/2ML IJ SOLN
INTRAMUSCULAR | Status: AC
  Filled 2019-08-23: qty 2

## 2019-08-23 MED ORDER — ROCURONIUM BROMIDE 50 MG/5ML IV SOLN
INTRAVENOUS | Status: AC
  Filled 2019-08-23: qty 1

## 2019-08-23 MED ORDER — LACTATED RINGERS IV SOLN
INTRAVENOUS | Status: DC
  Administered 2019-08-23: 12:00:00 via INTRAVENOUS

## 2019-08-23 NOTE — Anesthesia Preprocedure Evaluation (Signed)
Anesthesia Evaluation  Patient identified by MRN, date of birth, ID band Patient awake    Reviewed: Allergy & Precautions, NPO status , Patient's Chart, lab work & pertinent test results  Airway Mallampati: II       Dental   Pulmonary pneumonia, resolved, COPD, former smoker,    Pulmonary exam normal        Cardiovascular hypertension, + Peripheral Vascular Disease  Normal cardiovascular exam     Neuro/Psych PSYCHIATRIC DISORDERS Anxiety TIA Neuromuscular disease CVA    GI/Hepatic GERD  ,  Endo/Other    Renal/GU negative Renal ROS     Musculoskeletal  (+) Arthritis , Osteoarthritis,    Abdominal Normal abdominal exam  (+)   Peds negative pediatric ROS (+)  Hematology  (+) anemia ,   Anesthesia Other Findings   Reproductive/Obstetrics                             Anesthesia Physical Anesthesia Plan  ASA: III  Anesthesia Plan: General   Post-op Pain Management:    Induction: Intravenous  PONV Risk Score and Plan:   Airway Management Planned: Oral ETT  Additional Equipment:   Intra-op Plan:   Post-operative Plan: Extubation in OR  Informed Consent: I have reviewed the patients History and Physical, chart, labs and discussed the procedure including the risks, benefits and alternatives for the proposed anesthesia with the patient or authorized representative who has indicated his/her understanding and acceptance.     Dental advisory given  Plan Discussed with: CRNA and Surgeon  Anesthesia Plan Comments:         Anesthesia Quick Evaluation

## 2019-08-23 NOTE — Transfer of Care (Signed)
Immediate Anesthesia Transfer of Care Note  Patient: Donald [name]  Procedure(s) Performed: ELECTROMAGNETIC NAVIGATION BRONCHOSCOPY (Left ) ENDOBRONCHIAL ULTRASOUND (N/A )  Patient Location: PACU  Anesthesia Type:General  Level of Consciousness: sedated and patient cooperative  Airway & Oxygen Therapy: Patient Spontanous Breathing and Patient connected to face mask oxygen  Post-op Assessment: Report given to RN and Post -op Vital signs reviewed and stable  Post vital signs: Reviewed and stable  Last Vitals:  Vitals Value Taken Time  BP 156/53 08/23/19 1453  Temp 97.27F   Pulse 78 08/23/19 1453  Resp 18 08/23/19 1453  SpO2 100 % 08/23/19 1453  Vitals shown include unvalidated device data.  Last Pain:  Vitals:   08/23/19 1200  TempSrc: Oral  PainSc: 0-No pain         Complications: No apparent anesthesia complications

## 2019-08-23 NOTE — Op Note (Signed)
1. Electromagnetic Navigation Bronchoscopy: 2. Endobronchial Ultrasound 3. Endomicroscopy  Indication: 1) Lung nodule, LEFT upper lobe 2) Mediastinal adenopathy rule out CA versus lymphoma.  Patient with history of CLL/SLL  Preoperative Diagnosis: 1) Lung nodule, LEFT upper lobe 2) Mediastinal adenopathy  Post Procedure Diagnosis: Same as above  Consent: Verbal/Written  Operator: Renold Don, MD AssistantScrub: Newt Lukes, RRT Circulator: Annia Belt, RRT   CRNA: Dionne Bucy  Benefits, limitations and potential complications of the procedure were discussed with the patient/family  including, but not limited to bleeding, hemoptysis, respiratory failure requiring intubation and/or prolongued mechanical ventilation, infection, pneumothorax (collapse of lung) requiring chest tube placement, stroke from air embolism or even death.    The patient/family understand the risks and benefits and have agreed to go ahead with the procedure(s).  Hand washing performed prior to starting the procedure.   Type of Anesthesia: General Endotracheal.  See anesthesia records  Procedure Performed: 1)  Virtual Bronchoscopy with Multi-planar Image analysis, 3-D reconstruction of coronal, sagittal and multi-planar images for the purposes of planning real-time bronchoscopy using the iLogic Electromagnetic Navigation Bronchoscopy System (superDimension)  2) Endobronchial Ultrasound with TBNA  3) Endo-Microscopy (Cellvizio) 4) Fluoroscopy where appropriate  Description of Procedure: After appropriate timeout in Procedure Room 2 (Bronchoscopy Suite) the patient was inducted on the general anesthesia by CRNA/anesthesiologist.  Patient was intubated with a #8.5 ET tube without difficulty.  Once adequate anesthesia had been achieved, an anatomic tour was performed of the airway utilizing an Olympus therapeutic video bronchoscope.  There were no endobronchial lesions encountered.  The mucosa was somewhat  friable throughout.  Copious secretions easily lavaged.  After the anatomic tour had been performed, the Super Dimension extended working channel and Locatable Guide (LG) were introduced into the working channel of the bronchoscope.  The LG was then placed into the central portion of the trachea. The LG was then directed to standard registration points at the following centers: main carina, right upper lobe bronchus, right lower lobe bronchus, right middle lobe bronchus, left upper lobe bronchus, and the left lower lobe bronchus. This data was transferred to the i-Logic ENB system for real-time bronchoscopy.   The bronchoscope was navigated to the LEFT upper lobe for tissue sampling.  Target acquisition was confirmed with fluoroscopy.  The lesion was not readily visible due to the small size and confluence of bony structures obscuring it.  At this point the locatable guide was removed and Cellvizio endo-microscopy was performed.  This confirmed that the target area had been reached. Because of inability to see the lesion well brushings were performed so as to not cause pneumothorax.  Brushings were performed x3 on this area.  ROSE was consistent with possible granulomatous inflammation.  Targeted bronchoalveolar lavage was then performed in this area.  A total of 40 mL's of saline were instilled with 8 mL's of aliquot obtained.  Once this portion of the procedure was completed the therapeutic video bronchoscope was removed and this was exchanged for the Olympus endobronchial ultrasound scope.  The endobronchial ultrasound scope was advanced to the existing Portex adapter on the ET tube.  The mediastinum was examined.  There was a large node on the RIGHT paratracheal/precarinal area and this was sampled with a 25-gauge Cook EBUS needle with ROSE consistent with lymph node sampling.  The lymph node appeared to have calcifications within it, material was then placed into CytoLyt and RPMI media.  A total of 5  passes were performed in this area once the sampling  was completed, attention was then placed to the right subcarinal area.  Again a large lymph node could be seen which contain calcifications within it.  This area was sampled with a 25-gauge Cook EBUS needle for a total of 4 passes.  Material was placed into CytoLyt.  Having completed the nodal sampling, the endobronchial ultrasound scope was removed and the therapeutic bronchoscope was then replaced and the airway examined.  Hemostasis was excellent.  Estimated blood loss was less than 2 mL's.  At this point the bronchoscope was removed and the procedure was terminated.  Patient was allowed to emerge from general anesthesia and extubated in the procedure room without sequela.  He was taken to the PACU in satisfactory condition.  Postprocedure chest x-ray showed no pneumothorax.  Patient tolerated the procedure well.  Specimens Obtained:  Transbronchial Fine Needle Aspirations 25 G x5, RIGHT precarinal/paratracheal node; x4 right subcarinal space  Transbronchial brushings: X3 LEFT upper lobe  Targeted BAL: LEFT upper lobe, 8 mL aliquot  Fluoroscopy:  Fluoroscopy was utilized during the course of this procedure to assure that biopsies were taken in a safe manner under fluoroscopic guidance with spot films required.   Complications:None  Estimated Blood Loss: minimal  < 2 ml   Assessment: 1.  LEFT upper lobe nodule 2.  Mediastinal adenopathy  Plan/Additional Comments: 1.  Await pathology report 2.  Findings discussed with patient's wife and daughter per his request. 3.  Patient has follow-up appointment, will call also with results.   Renold Don, MD [facility] Advanced Bronchoscopy

## 2019-08-23 NOTE — Anesthesia Procedure Notes (Addendum)
Procedure Name: Intubation Date/Time: 08/23/2019 1:34 PM Performed by: Dionne Bucy, CRNA Pre-anesthesia Checklist: Patient identified, Patient being monitored, Timeout performed, Emergency Drugs available and Suction available Patient Re-evaluated:Patient Re-evaluated prior to induction Oxygen Delivery Method: Circle system utilized Preoxygenation: Pre-oxygenation with 100% oxygen Induction Type: IV induction Ventilation: Mask ventilation without difficulty Laryngoscope Size: McGraph and 4 Grade View: Grade I Tube type: Oral Tube size: 8.5 mm Number of attempts: 1 Airway Equipment and Method: Stylet and Video-laryngoscopy Placement Confirmation: ETT inserted through vocal cords under direct vision,  positive ETCO2 and breath sounds checked- equal and bilateral Secured at: 23 cm Tube secured with: Tape Dental Injury: Teeth and Oropharynx as per pre-operative assessment

## 2019-08-23 NOTE — Discharge Instructions (Signed)
Flexible Bronchoscopy, Care After This sheet gives you information about how to care for yourself after your test. Your doctor may also give you more specific instructions. If you have problems or questions, contact your doctor. Follow these instructions at home: Eating and drinking  Do not eat or drink anything (not even water) for 2 hours after your test, or until your numbing medicine (local anesthetic) wears off.  When your numbness is gone and your cough and gag reflexes have come back, you may: ? Eat only soft foods. ? Slowly drink liquids.  The day after the test, go back to your normal diet. Driving  Do not drive for 24 hours if you were given a medicine to help you relax (sedative).  Do not drive or use heavy machinery while taking prescription pain medicine. General instructions   Take over-the-counter and prescription medicines only as told by your doctor.  Return to your normal activities as told. Ask what activities are safe for you.  Do not use any products that have nicotine or tobacco in them. This includes cigarettes and e-cigarettes. If you need help quitting, ask your doctor.  Keep all follow-up visits as told by your doctor. This is important. It is very important if you had a tissue sample (biopsy) taken. Get help right away if:  You have shortness of breath that gets worse.  You get light-headed.  You feel like you are going to pass out (faint).  You have chest pain.  You cough up: ? More than a little blood. ? More blood than before. Summary  Do not eat or drink anything (not even water) for 2 hours after your test, or until your numbing medicine wears off.  Do not use cigarettes. Do not use e-cigarettes.  Get help right away if you have chest pain. This information is not intended to replace advice given to you by your health care provider. Make sure you discuss any questions you have with your health care provider. Document Released: 08/25/2009  Document Revised: 10/10/2017 Document Reviewed: 11/15/2016 Elsevier Patient Education  2020 [employer].

## 2019-08-23 NOTE — Interval H&P Note (Signed)
History and Physical Interval Note:  08/23/2019 12:30 PM  Donald [name]  has presented today for surgery, with the diagnosis of left uper lobe lesion and mediastinal adenopathy.  The various methods of treatment have been discussed with the patient and family. After consideration of risks, benefits and other options for treatment, the patient has consented to  Procedure(s): ELECTROMAGNETIC NAVIGATION BRONCHOSCOPY (Left) ENDOBRONCHIAL ULTRASOUND (N/A) as a surgical intervention.  The patient's history has been reviewed, patient examined, no change in status, stable for surgery.  I have reviewed the patient's chart and labs.  Questions were answered to the patient's satisfaction.     Renold Don, MD [facility]

## 2019-08-23 NOTE — Anesthesia Post-op Follow-up Note (Signed)
Anesthesia QCDR form completed.        

## 2019-08-24 ENCOUNTER — Encounter: Payer: Self-pay | Admitting: Pulmonary Disease

## 2019-08-24 LAB — CYTOLOGY - NON PAP

## 2019-08-24 NOTE — Anesthesia Postprocedure Evaluation (Signed)
Anesthesia Post Note  Patient: Donald [name]  Procedure(s) Performed: ELECTROMAGNETIC NAVIGATION BRONCHOSCOPY (Left ) ENDOBRONCHIAL ULTRASOUND (N/A )  Patient location during evaluation: PACU Anesthesia Type: General Level of consciousness: awake and alert and oriented Pain management: pain level controlled Vital Signs Assessment: post-procedure vital signs reviewed and stable Respiratory status: spontaneous breathing Cardiovascular status: blood pressure returned to baseline Anesthetic complications: no     Last Vitals:  Vitals:   08/23/19 1540 08/23/19 1549  BP: (!) 143/51 (!) 157/54  Pulse: 73 77  Resp: 12 16  Temp: 36.6 C   SpO2: 95% 94%    Last Pain:  Vitals:   08/24/19 0821  TempSrc:   PainSc: 0-No pain                 Donald [name]

## 2019-08-26 ENCOUNTER — Institutional Professional Consult (permissible substitution): Payer: Medicare Other | Admitting: Radiation Oncology

## 2019-08-26 ENCOUNTER — Ambulatory Visit: Payer: Medicare Other | Admitting: Oncology

## 2019-08-26 DIAGNOSIS — J449 Chronic obstructive pulmonary disease, unspecified: Secondary | ICD-10-CM | POA: Diagnosis not present

## 2019-08-26 NOTE — Progress Notes (Signed)
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   INTERVAL HISTORY: Patient returns to clinic today for further evaluation and discussion of his biopsy results.  He currently feels well and is asymptomatic. He has no neurologic complaints.  He denies any recent fevers or illnesses.  He has a good appetite and denies weight loss.  He denies any night sweats.  He has no chest pain, cough, hemoptysis, or shortness of breath.  He denies any nausea, vomiting, constipation, or diarrhea.  He has no urinary complaints.  Patient offers no specific complaints today.  Review of Systems  Constitutional: Negative.  Negative for fever, malaise/fatigue and weight loss.  Respiratory: Negative.   Cardiovascular: Negative.  Negative for chest pain and leg swelling.  Gastrointestinal: Negative.  Negative for abdominal pain.  Genitourinary: Negative.  Negative for dysuria.  Musculoskeletal: Negative.  Negative for back pain.  Skin: Negative.  Negative for rash.  Neurological: Negative.  Negative for dizziness, focal weakness, weakness and headaches.  Psychiatric/Behavioral: Negative.  The patient is not nervous/anxious.    As per HPI. Otherwise, a complete review of systems is negative.  PAST MEDICAL HISTORY: Past Medical History:  Diagnosis Date   Anemia    Arthritis    BPH (benign prostatic hyperplasia)    Cancer (HCC)    COPD (chronic obstructive pulmonary disease) (HCC)    Emphysema of lung (HCC)    Emphysema of lung (HCC)    Esophageal reflux    Hyperlipidemia    Hypertension    Internal carotid artery stenosis,  right    Neuropathy of both feet    Pneumonia    recurrent   PTSD (post-traumatic stress disorder)    [place] vet   Sebaceous cyst    SOB (shortness of breath) on exertion    TIA (transient ischemic attack)    Urge incontinence     PAST SURGICAL HISTORY: Past Surgical History:  Procedure Laterality Date   CYST EXCISION     buttocks   ELECTROMAGNETIC NAVIGATION BROCHOSCOPY Left 08/23/2019   Procedure: ELECTROMAGNETIC NAVIGATION BRONCHOSCOPY;  Surgeon: Tyler Pita, MD;  Location: [facility] ORS;  Service: Cardiopulmonary;  Laterality: Left;   ENDOBRONCHIAL ULTRASOUND N/A 08/23/2019   Procedure: ENDOBRONCHIAL ULTRASOUND;  Surgeon: Tyler Pita, MD;  Location: [facility] ORS;  Service: Cardiopulmonary;  Laterality: N/A;   HEMORRHOID SURGERY     SINUSOTOMY      FAMILY HISTORY: Family History  Problem Relation Age of Onset   Heart disease Mother    Cancer Maternal Aunt        lung   Lung cancer Maternal Aunt    Leukemia Maternal Aunt     ADVANCED DIRECTIVES (Y/N):  N  HEALTH MAINTENANCE: Social History   Tobacco Use   Smoking status: Former Smoker    Packs/day: 1.50    Years: 50.00    Pack years: 75.00    Types: Cigarettes    Quit date: 09/10/1999    Years since quitting: 19.9   Smokeless tobacco: Never Used  Substance Use Topics   Alcohol use: No    Alcohol/week: 0.0 standard drinks    Frequency: Never    Comment: not since 12/1980   Drug use:  No     Colonoscopy:  PAP:  Bone density:  Lipid panel:  Allergies  Allergen Reactions   Cephalexin Rash    Current Outpatient Medications  Medication Sig Dispense Refill   albuterol (VENTOLIN HFA) 108 (90 Base) MCG/ACT inhaler Inhale 2 puffs into the lungs every 6 (six) hours as needed for wheezing or shortness of breath. 18 g 6   amLODipine (NORVASC) 5 MG tablet Take 1 tablet (5 mg total) by mouth 2 (two) times daily. (Patient taking differently: Take 5 mg by mouth daily. ) 180 tablet 1     aspirin EC 81 MG tablet Take 81 mg by mouth daily.      atorvastatin (LIPITOR) 80 MG tablet Take 40 mg by mouth daily.     cholecalciferol (VITAMIN D) 1000 UNITS tablet Take 1,000 Units by mouth 2 (two) times daily.      desipramine (NOPRAMIN) 10 MG tablet Take 10 mg by mouth at bedtime.     dutasteride (AVODART) 0.5 MG capsule Take 1 capsule (0.5 mg total) by mouth daily. 90 capsule 1   finasteride (PROSCAR) 5 MG tablet Take 5 mg by mouth daily.     hydrochlorothiazide (HYDRODIURIL) 25 MG tablet Take 25 mg by mouth every other day.     lisinopril (ZESTRIL) 20 MG tablet Take 20 mg by mouth daily.      Multiple Vitamins-Minerals (MEGA MULTIVITAMIN FOR MEN) TABS Take 1 tablet by mouth daily.      omeprazole (PRILOSEC) 20 MG capsule Take 1 capsule (20 mg total) by mouth daily. 30 capsule 6   sertraline (ZOLOFT) 100 MG tablet Take 100 mg by mouth daily.     tamsulosin (FLOMAX) 0.4 MG CAPS capsule Take 1 capsule (0.4 mg total) by mouth daily. (Patient taking differently: Take 0.4 mg by mouth every evening. ) 90 capsule 3   valACYclovir (VALTREX) 500 MG tablet Take 1 tablet (500 mg total) by mouth every other day. 15 tablet 3   No current facility-administered medications for this visit.     OBJECTIVE: Vitals:   08/30/19 1026  BP: (!) 129/54  Pulse: 62  Temp: 98.3 F (36.8 C)     Body mass index is 33.95 kg/m.    ECOG FS:0 - Asymptomatic  General: Well-developed, well-nourished, no acute distress. Eyes: Pink conjunctiva, anicteric sclera. HEENT: Normocephalic, moist mucous membranes. Lungs: Clear to auscultation bilaterally. Heart: Regular rate and rhythm. No rubs, murmurs, or gallops. Abdomen: Soft, nontender, nondistended. No organomegaly noted, normoactive bowel sounds. Musculoskeletal: No edema, cyanosis, or clubbing. Neuro: Alert, answering all questions appropriately. Cranial nerves grossly intact. Skin: No rashes or petechiae noted. Psych: Normal affect.  LAB  RESULTS:  Lab Results  Component Value Date   NA 138 08/18/2019   K 4.9 08/18/2019   CL 106 08/18/2019   CO2 23 08/18/2019   GLUCOSE 128 (H) 08/18/2019   BUN 27 (H) 08/18/2019   CREATININE 1.61 (H) 08/18/2019   CALCIUM 9.5 08/18/2019   PROT 6.7 12/29/2018   ALBUMIN 4.1 12/29/2018   AST 20 12/29/2018   ALT 17 12/29/2018   ALKPHOS 62 12/29/2018   BILITOT 0.3 12/29/2018   GFRNONAA 39 (L) 08/18/2019   GFRAA 45 (L) 08/18/2019    Lab Results  Component Value Date   WBC 11.8 (H) 08/18/2019   NEUTROABS 5.8 07/30/2019   HGB 9.6 (L) 08/18/2019   HCT 30.6 (L) 08/18/2019   MCV 87.4 08/18/2019   PLT 214 08/18/2019     STUDIES: Nm Pet Image Initial (  pi) Skull Base To Thigh  Result Date: 08/05/2019 CLINICAL DATA:  Subsequent treatment strategy for CLL. Enlarging apical left upper lobe pulmonary nodule. EXAM: NUCLEAR MEDICINE PET SKULL BASE TO THIGH TECHNIQUE: 12.2 mCi F-18 FDG was injected intravenously. Full-ring PET imaging was performed from the skull base to thigh after the radiotracer. CT data was obtained and used for attenuation correction and anatomic localization. Fasting blood glucose: 105 mg/dl COMPARISON:  04/10/2018 PET-CT. 07/27/2019 CT chest, abdomen and pelvis. FINDINGS: Mediastinal blood pool activity: SUV max 2.3 Liver activity: SUV max 4.2 NECK: No hypermetabolic lymph nodes in the neck. Incidental CT findings: none CHEST: Apical left upper lobe subsolid 1.5 cm pulmonary nodule (series 3/image 75) is newly hypermetabolic with max SUV 4.4, increased in size from 0.4 cm on 04/10/2018 PET-CT. No additional hypermetabolic pulmonary findings. Mildly enlarged bilateral axillary lymph nodes are not appreciably changed in size since 04/10/2018 PET-CT and are non hypermetabolic. Representative 1.0 cm left axillary node with max SUV 1.5 (series 3/image 82), previously 1.2 cm with max SUV 1.7. Mildly enlarged and mildly hypermetabolic 1.8 cm right paratracheal node with max SUV 3.0  (series 3/image 86), previously 1.9 cm with max SUV 3.3, slightly decreased in size and metabolism. Mildly enlarged and mildly hypermetabolic 1.9 cm subcarinal node with max SUV 2.8 (series 3/image 100), previously 2.1 cm with max SUV 3.4, slightly decreased in size and metabolism. No new hypermetabolic mediastinal or hilar lymph nodes. Incidental CT findings: Coronary atherosclerosis. Atherosclerotic nonaneurysmal thoracic aorta. Mild centrilobular emphysema. ABDOMEN/PELVIS: Top-normal size mesenteric lymph nodes with low level uptake are minimally decreased in size and metabolism. Representative 0.8 cm central mesenteric node with max SUV 2.4 (series 3/image 176), previously 1.1 cm with max SUV 2.7. Top-normal size 0.9 cm right external iliac node with low level uptake with max SUV 1.8 (series 3/image 241), previously 1.0 cm with max SUV 2.2, slightly decreased in size and metabolism. Top-normal size 1.0 cm left inguinal node is mildly hypermetabolic with max SUV 3.0 (series 3/image 253), previously 0.8 cm with max SUV 2.5, minimally increased in size and metabolism. No abnormal hypermetabolic activity within the liver, pancreas, adrenal glands, or spleen. Incidental CT findings: Small hiatal hernia. Atherosclerotic nonaneurysmal abdominal aorta. Moderate left colonic diverticulosis. SKELETON: No focal hypermetabolic activity to suggest skeletal metastasis. Incidental CT findings: none IMPRESSION: 1. Subsolid apical left upper lobe 1.5 cm pulmonary nodule is newly hypermetabolic (max SUV 4.4) and increased in size since 04/10/2018 PET-CT. Findings are worrisome for a slowly enlarging primary bronchogenic adenocarcinoma. No new hypermetabolic thoracic or extrathoracic findings to suggest metastatic disease due to this suspected primary bronchogenic carcinoma. 2. Otherwise relatively stable mildly hypermetabolic mediastinal, mesenteric and bilateral pelvic lymphadenopathy since 04/10/2018 PET-CT, with minimal mixed  metabolic changes as detailed, compatible with chronic lymphoproliferative disorder. 3. Aortic Atherosclerosis (ICD10-I70.0) and Emphysema (ICD10-J43.9). Additional chronic findings as detailed. Electronically Signed   By: Ilona Sorrel M.D.   On: 08/05/2019 16:35   Dg Chest Port 1 View  Result Date: 08/23/2019 CLINICAL DATA:  Status post bronchoscopic left upper lobe pulmonary nodule biopsy. EXAM: PORTABLE CHEST 1 VIEW COMPARISON:  Chest x-ray dated 02/17/2019 and chest CT dated 07/27/2019 FINDINGS: There is no pneumothorax or pulmonary hemorrhage or pleural effusion or other acute abnormality after bronchoscopic biopsy. Heart size and pulmonary vascularity are normal. Aortic atherosclerosis. Chronic slight accentuation of the interstitial markings bilaterally. The small nodule in the left lung apex seen on the CT scan is not appreciable on this exam. IMPRESSION: 1. No acute  abnormalities. Specifically, no pneumothorax or pulmonary hemorrhage or pleural effusion after bronchoscopic biopsy. 2. Aortic atherosclerosis. Electronically Signed   By: Lorriane Shire M.D.   On: 08/23/2019 17:11   Dg C-arm 1-60 Min-no Report  Result Date: 08/23/2019 Fluoroscopy was utilized by the requesting physician.  No radiographic interpretation.    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  CT scan results from July 27, 2019 reviewed independently with moderately progressive lymphadenopathy in bilateral axilla, mediastinal, retroperitoneal, iliac, and inguinal lymph nodes.    PET scan results from August 05, 2019 with minimal metabolic changes to known lymphadenopathy compatible with a chronic low-grade lymphoproliferative disorder.  Previously, his flow cytometry panel reported a CD5+ and CD23+ clonal B-cell population, highly suspicious for CLL/SLL.  Patient does not  require biopsy, but will consider one if patient required treatment.  Return to clinic in 6 months with repeat laboratory can further evaluation.  2.  Left upper lobe pulmonary nodule: Increased in size from 1 cm to 1.4 cm.  PET scan results from August 05, 2019 reviewed independently with PET positive left upper lobe pulmonary nodule.  Despite this, navigational bronchoscopy was negative for malignancy.  Continue to monitor closely and repeat CT scan in 6 months to assess for interval change.  Follow-up 1 to 2 days later as above. 3.  Anemia: Mild, monitor.    Patient expressed understanding and was in agreement with this plan. He also understands that He can call clinic at any time with any questions, concerns, or complaints.   Cancer Staging No matching staging information was found for the patient.  Lloyd Huger, MD   08/30/2019 3:07 PM

## 2019-08-30 ENCOUNTER — Other Ambulatory Visit: Payer: Self-pay

## 2019-08-30 ENCOUNTER — Encounter: Payer: Self-pay | Admitting: Oncology

## 2019-08-30 ENCOUNTER — Ambulatory Visit
Admission: RE | Admit: 2019-08-30 | Discharge: 2019-08-30 | Disposition: A | Discharge status: Home / Self Care | Payer: Medicare Other | Source: Ambulatory Visit | Attending: Radiation Oncology | Admitting: Radiation Oncology

## 2019-08-30 ENCOUNTER — Inpatient Hospital Stay: Payer: Medicare Other | Attending: Oncology | Admitting: Oncology

## 2019-08-30 VITALS — BP 129/54 | HR 62 | Temp 98.3°F | Ht 68.0 in | Wt 223.3 lb

## 2019-08-30 DIAGNOSIS — D649 Anemia, unspecified: Secondary | ICD-10-CM | POA: Insufficient documentation

## 2019-08-30 DIAGNOSIS — Z79899 Other long term (current) drug therapy: Secondary | ICD-10-CM | POA: Diagnosis not present

## 2019-08-30 DIAGNOSIS — C9110 Chronic lymphocytic leukemia of B-cell type not having achieved remission: Secondary | ICD-10-CM | POA: Diagnosis not present

## 2019-08-30 DIAGNOSIS — R911 Solitary pulmonary nodule: Secondary | ICD-10-CM | POA: Diagnosis not present

## 2019-09-02 ENCOUNTER — Ambulatory Visit: Payer: Medicare HMO | Admitting: Urology

## 2019-09-06 ENCOUNTER — Ambulatory Visit: Payer: Medicare Other | Admitting: Physician Assistant

## 2019-09-06 ENCOUNTER — Other Ambulatory Visit: Payer: Self-pay

## 2019-09-06 ENCOUNTER — Encounter: Payer: Self-pay | Admitting: Physician Assistant

## 2019-09-06 VITALS — Ht 68.0 in

## 2019-09-06 DIAGNOSIS — N138 Other obstructive and reflux uropathy: Secondary | ICD-10-CM | POA: Diagnosis not present

## 2019-09-06 DIAGNOSIS — N401 Enlarged prostate with lower urinary tract symptoms: Secondary | ICD-10-CM

## 2019-09-06 LAB — BLADDER SCAN AMB NON-IMAGING

## 2019-09-06 MED ORDER — DUTASTERIDE 0.5 MG PO CAPS: 0.5000 mg | ORAL_CAPSULE | Freq: Every day | ORAL | 2 refills | Status: DC

## 2019-09-06 MED ORDER — TAMSULOSIN HCL 0.4 MG PO CAPS: 0.4000 mg | ORAL_CAPSULE | Freq: Every day | ORAL | 2 refills | Status: DC

## 2019-09-06 NOTE — Progress Notes (Signed)
09/06/2019 9:59 AM   Donald [name] August 14, 1933 [id number]  CC: Annual BPH follow-up  HPI: Donald [name] is a 85 y.o. male who presents today for routine follow-up of BPH with LUTS. He is an established [facility] patient who last saw Dr. Bernardo Heater on 08/31/2018 for the same.  PMH significant for low-grade lymphoma and COPD.  He is on tamsulosin and dutasteride. IPSS 19-4, as below, with worsening of frequency and urgency. CT abdomen pelvis with contrast on 07/27/2019 with prostatomegaly, approximate volume 43mL.  PVR 197mL.  IPSS Questionnaire (AUA-7): Over the past month.   1)  How often have you had a sensation of not emptying your bladder completely after you finish urinating?  5 - Almost always  2)  How often have you had to urinate again less than two hours after you finished urinating? 3 - About half the time  3)  How often have you found you stopped and started again several times when you urinated?  1 - Less than 1 time in 5  4) How difficult have you found it to postpone urination?  4 - More than half the time  5) How often have you had a weak urinary stream?  1 - Less than 1 time in 5  6) How often have you had to push or strain to begin urination?  0 - Not at all  7) How many times did you most typically get up to urinate from the time you went to bed until the time you got up in the morning?  5 - 5+ times  Total score:  0-7 mildly symptomatic   8-19 moderately symptomatic   20-35 severely symptomatic   PMH: Past Medical History:  Diagnosis Date  . Anemia   . Arthritis   . BPH (benign prostatic hyperplasia)   . Cancer ([place])   . COPD (chronic obstructive pulmonary disease) ([place])   . Emphysema of lung ([place])   . Emphysema of lung ([place])   . Esophageal reflux   . Hyperlipidemia   . Hypertension   . Internal carotid artery stenosis, right   . Neuropathy of both feet   . Pneumonia    recurrent  . PTSD (post-traumatic stress disorder)    [place] vet  .  Sebaceous cyst   . SOB (shortness of breath) on exertion   . TIA (transient ischemic attack)   . Urge incontinence     Surgical History: Past Surgical History:  Procedure Laterality Date  . CYST EXCISION     buttocks  . ELECTROMAGNETIC NAVIGATION BROCHOSCOPY Left 08/23/2019   Procedure: ELECTROMAGNETIC NAVIGATION BRONCHOSCOPY;  Surgeon: Tyler Pita, MD;  Location: [facility] ORS;  Service: Cardiopulmonary;  Laterality: Left;  . ENDOBRONCHIAL ULTRASOUND N/A 08/23/2019   Procedure: ENDOBRONCHIAL ULTRASOUND;  Surgeon: Tyler Pita, MD;  Location: [facility] ORS;  Service: Cardiopulmonary;  Laterality: N/A;  . HEMORRHOID SURGERY    . SINUSOTOMY      Home Medications:  Allergies as of 09/06/2019      Reactions   Cephalexin Rash      Medication List       Accurate as of September 06, 2019  9:59 AM. If you have any questions, ask your nurse or doctor.        albuterol 108 (90 Base) MCG/ACT inhaler Commonly known as: VENTOLIN HFA Inhale 2 puffs into the lungs every 6 (six) hours as needed for wheezing or shortness of breath.   amLODipine 5 MG tablet Commonly known as: NORVASC Take  1 tablet (5 mg total) by mouth 2 (two) times daily. What changed: when to take this   aspirin EC 81 MG tablet Take 81 mg by mouth daily.   atorvastatin 80 MG tablet Commonly known as: LIPITOR Take 40 mg by mouth daily.   cholecalciferol 1000 units tablet Commonly known as: VITAMIN D Take 1,000 Units by mouth 2 (two) times daily.   desipramine 10 MG tablet Commonly known as: NOPRAMIN Take 10 mg by mouth at bedtime.   dutasteride 0.5 MG capsule Commonly known as: AVODART Take 1 capsule (0.5 mg total) by mouth daily.   finasteride 5 MG tablet Commonly known as: PROSCAR Take 5 mg by mouth daily.   hydrochlorothiazide 25 MG tablet Commonly known as: HYDRODIURIL Take 25 mg by mouth every other day.   lisinopril 20 MG tablet Commonly known as: ZESTRIL Take 20 mg by mouth daily.   Mega  Multivitamin for Men Tabs Take 1 tablet by mouth daily.   omeprazole 20 MG capsule Commonly known as: PRILOSEC Take 1 capsule (20 mg total) by mouth daily.   sertraline 100 MG tablet Commonly known as: ZOLOFT Take 100 mg by mouth daily.   tamsulosin 0.4 MG Caps capsule Commonly known as: FLOMAX Take 1 capsule (0.4 mg total) by mouth daily. What changed: when to take this   valACYclovir 500 MG tablet Commonly known as: VALTREX Take 1 tablet (500 mg total) by mouth every other day.       Allergies:  Allergies  Allergen Reactions  . Cephalexin Rash    Family History: Family History  Problem Relation Age of Onset  . Heart disease Mother   . Cancer Maternal Aunt        lung  . Lung cancer Maternal Aunt   . Leukemia Maternal Aunt     Social History:   reports that he quit smoking about 20 years ago. His smoking use included cigarettes. He has a 75.00 pack-year smoking history. He has never used smokeless tobacco. He reports that he does not drink alcohol or use drugs.  ROS: UROLOGY Frequent Urination?: Yes Hard to postpone urination?: Yes Burning/pain with urination?: No Get up at night to urinate?: Yes Leakage of urine?: Yes Urine stream starts and stops?: No Trouble starting stream?: No Do you have to strain to urinate?: No Blood in urine?: No Urinary tract infection?: No Sexually transmitted disease?: No Injury to kidneys or bladder?: No Painful intercourse?: No Weak stream?: No Erection problems?: No Penile pain?: No  Gastrointestinal Nausea?: No Vomiting?: No Indigestion/heartburn?: No Diarrhea?: No Constipation?: No  Constitutional Fever: No Night sweats?: No Weight loss?: No Fatigue?: No  Skin Skin rash/lesions?: No Itching?: No  Eyes Blurred vision?: No Double vision?: No  Ears/Nose/Throat Sore throat?: No Sinus problems?: No  Hematologic/Lymphatic Swollen glands?: No Easy bruising?: No  Cardiovascular Leg swelling?: Yes  Chest pain?: No  Respiratory Cough?: No Shortness of breath?: Yes  Endocrine Excessive thirst?: No  Musculoskeletal Back pain?: Yes Joint pain?: Yes  Neurological Headaches?: No Dizziness?: No  Psychologic Depression?: No Anxiety?: No  Physical Exam: Ht 5\' 8"  (1.727 m)   BMI 33.95 kg/m   Constitutional:  Alert and oriented, no acute distress, nontoxic appearing HEENT: [place], AT Cardiovascular: No clubbing, cyanosis, or edema Respiratory: Occasional dry cough, increased respiratory rate Skin: No rashes, bruises or suspicious lesions Neurologic: Grossly intact, no focal deficits, moving all 4 extremities Psychiatric: Normal mood and affect  Laboratory Data: Results for orders placed or performed in visit on  09/06/19  BLADDER SCAN AMB NON-IMAGING  Result Value Ref Range   Scan Result 164mL    Pertinent Imaging: CT AP with contrast, 07/27/2019: CLINICAL DATA:  Follow-up CLL  EXAM: CT CHEST, ABDOMEN, AND PELVIS WITH CONTRAST  TECHNIQUE: Multidetector CT imaging of the chest, abdomen and pelvis was performed following the standard protocol during bolus administration of intravenous contrast.  CONTRAST:  173mL OMNIPAQUE IOHEXOL 300 MG/ML SOLN, additional oral enteric contrast  COMPARISON:  CT chest abdomen pelvis 01/21/2019, 07/21/2018, PET-CT, 04/10/2018  FINDINGS: CT CHEST FINDINGS  Cardiovascular: Aortic atherosclerosis. Normal heart size. Scattered three-vessel coronary artery calcifications. No pericardial effusion.  Mediastinum/Nodes: There are numerous enlarged bilateral axillary and mediastinal lymph nodes, the largest pretracheal lymph node measuring 1.9 x 1.8 cm, slightly enlarged compared to prior examination at which time it measured 1.5 x 1.5 cm (series 2, image 19). The largest right axillary node measures 1.8 x 1.2 cm, slightly enlarged compared to prior examination at which time it measured 1.3 x 0.8 cm (series 2, image 20). Thyroid  gland, trachea, and esophagus demonstrate no significant findings.  Lungs/Pleura: Mild centrilobular emphysema. There has been interval enlargement of a lobulated pulmonary nodule or adjacent clustered nodules of the apical left upper lobe, now measuring 1.4 x 0.8 cm, previously 1.0 x 0.7 cm when measured similarly (series 3, image 28). There are additional, occasional scattered ground-glass opacities, for example in the posterior right upper lobe (series 3, image 59) and mild bibasilar scarring. No pleural effusion or pneumothorax.  Musculoskeletal: No chest wall mass or suspicious bone lesions identified.  CT ABDOMEN PELVIS FINDINGS  Hepatobiliary: No solid liver abnormality is seen. No gallstones, gallbladder wall thickening, or biliary dilatation.  Pancreas: Unremarkable. No pancreatic ductal dilatation or surrounding inflammatory changes.  Spleen: Normal in size without significant abnormality.  Adrenals/Urinary Tract: Adrenal glands are unremarkable. Kidneys are normal, without renal calculi, solid lesion, or hydronephrosis. Bladder is unremarkable.  Stomach/Bowel: Stomach is within normal limits. Appendix appears normal. No evidence of bowel wall thickening, distention, or inflammatory changes. Descending and sigmoid diverticulosis.  Vascular/Lymphatic: Aortic atherosclerosis. There are numerous prominent and enlarged retroperitoneal, iliac, and inguinal lymph nodes, the largest left inguinal nodes enlarged measuring 1.0 x 0.9 cm, previously 7 mm (series 2, image 119). The largest right iliac lymph node measures 2.1 x 1.2 cm, previously 1.9 by 1.0 cm (series 2, image 114). There are numerous enlarged small bowel mesenteric lymph nodes, not significantly changed, the largest measuring 1.8 x 1.2 cm, previously 2.0 x 1.2 cm (series 2, image 68).  Reproductive: Prostatomegaly.  Other: No abdominal wall hernia or abnormality. No abdominopelvic ascites.   Musculoskeletal: No acute or significant osseous findings.  IMPRESSION: 1. There are numerous enlarged bilateral axillary and mediastinal lymph nodes, the largest pretracheal lymph node measuring 1.9 x 1.8 cm, slightly enlarged compared to prior examination at which time it measured 1.5 x 1.5 cm (series 2, image 19). The largest right axillary node measures 1.8 x 1.2 cm, slightly enlarged compared to prior examination at which time it measured 1.3 x 0.8 cm (series 2, image 20).  2. There are numerous prominent and enlarged retroperitoneal, iliac, and inguinal lymph nodes, the largest left inguinal nodes enlarged measuring 1.0 x 0.9 cm, previously 7 mm (series 2, image 119). The largest right iliac lymph node measures 2.1 x 1.2 cm, previously 1.9 by 1.0 cm (series 2, image 114). There are numerous enlarged small bowel mesenteric lymph nodes, not significantly changed, the largest measuring 1.8 x 1.2 cm,  previously 2.0 x 1.2 cm (series 2, image 68).  3. Overall findings are consistent with slight interval worsening of lymphadenopathy.  4. There has been interval enlargement of a lobulated pulmonary nodule or adjacent clustered nodules of the apical left upper lobe, now measuring 1.4 x 0.8 cm, previously 1.0 x 0.7 cm when measured similarly (series 3, image 28). This finding has clearly increased in size on sequential prior examinations and is concerning for malignancy, although atypical infection or inflammation may is a differential consideration. Pulmonary involvement of CLL is considered much less likely. PET-CT may be used to assess for FDG avidity and tissue sampling can be considered.  5. Additional scattered nonspecific infectious or inflammatory ground-glass opacities of the lungs.  6. Coronary artery disease. Aortic Atherosclerosis (ICD10-I70.0) and Emphysema (ICD10-J43.9).   Electronically Signed   By: Eddie Candle M.D.   On: 07/27/2019 11:33  I personally  reviewed the images referenced above and note the presence of an enlarged prostate with mass-effect on the bladder.  Assessment & Plan:   1. Benign prostatic hyperplasia with urinary obstruction 85 year old male with approximate 17mL prostate on dual therapy dutasteride and tamsulosin with worsening of urgency and frequency over the past year.  PVR within normal limits, I am not concerned for urinary retention at this time.  He is not interested in pursuing bladder outlet procedures today and would rather defer pending symptom worsening.  I counseled him on a trial of Myrbetriq for alleviation of his symptoms.  He would like to defer this due to the cost of this medication.  I am in agreement with this plan.  I advised patient on symptoms of acute urinary retention, including inability to urinate, suprapubic pain and fullness, and lumbar pain.  I urged him to contact the office if he begins to experience these or proceed to the emergency room if outside of normal office hours.  He expressed understanding.  Refills sent to his pharmacy today. - BLADDER SCAN AMB NON-IMAGING - dutasteride (AVODART) 0.5 MG capsule; Take 1 capsule (0.5 mg total) by mouth daily.  Dispense: 90 capsule; Refill: 2 - tamsulosin (FLOMAX) 0.4 MG CAPS capsule; Take 1 capsule (0.4 mg total) by mouth daily.  Dispense: 90 capsule; Refill: 2  Return in about 1 year (around 09/05/2020) for PVR and IPSS.  Debroah Loop, PA-C  [facility] [address] [place], [place] [address] [phone]

## 2019-09-09 ENCOUNTER — Telehealth: Payer: Self-pay | Admitting: Pulmonary Disease

## 2019-09-09 DIAGNOSIS — Z9189 Other specified personal risk factors, not elsewhere classified: Secondary | ICD-10-CM

## 2019-09-09 NOTE — Telephone Encounter (Signed)
ONO reviewed by LG. recommend split night sleep study. If pt is not agreeable to sleep study, he will need 2L QHS.  ATC pt relay results- unable to leave voicemail due to mailbox not being setup. Will call back.

## 2019-09-13 NOTE — Telephone Encounter (Signed)
Spoke to pt and relayed below recommendations.  Split night has been order as pt was agreeable.  Nothing further is needed.

## 2019-09-20 DIAGNOSIS — R0602 Shortness of breath: Secondary | ICD-10-CM | POA: Diagnosis not present

## 2019-09-20 DIAGNOSIS — M7989 Other specified soft tissue disorders: Secondary | ICD-10-CM | POA: Diagnosis not present

## 2019-09-20 DIAGNOSIS — G459 Transient cerebral ischemic attack, unspecified: Secondary | ICD-10-CM | POA: Diagnosis not present

## 2019-09-20 DIAGNOSIS — I10 Essential (primary) hypertension: Secondary | ICD-10-CM | POA: Diagnosis not present

## 2019-10-12 ENCOUNTER — Telehealth: Payer: Self-pay | Admitting: Pulmonary Disease

## 2019-10-12 NOTE — Telephone Encounter (Signed)
ATC pt relay date/time of covid test for sleep study. Unable to leave vm, due to mailbox not being setup.  10/15/2019 prior to 11:[facility]

## 2019-10-13 NOTE — Telephone Encounter (Signed)
Called pt to relay below date/time.  Pt stated that he canceled PFT, as he did not wish to proceed.   Will route to Dr. Patsey Berthold as an Juluis Rainier.

## 2019-10-13 NOTE — Telephone Encounter (Signed)
Noted  

## 2019-10-15 ENCOUNTER — Other Ambulatory Visit: Payer: Medicare Other

## 2019-10-25 ENCOUNTER — Encounter: Payer: Medicare HMO | Admitting: Family Medicine

## 2019-10-25 ENCOUNTER — Ambulatory Visit: Payer: Medicare HMO

## 2019-11-25 DIAGNOSIS — J449 Chronic obstructive pulmonary disease, unspecified: Secondary | ICD-10-CM | POA: Diagnosis not present

## 2020-02-22 ENCOUNTER — Ambulatory Visit (INDEPENDENT_AMBULATORY_CARE_PROVIDER_SITE_OTHER): Payer: Medicare HMO | Admitting: Internal Medicine

## 2020-02-22 ENCOUNTER — Encounter: Payer: Self-pay | Admitting: Internal Medicine

## 2020-02-22 ENCOUNTER — Other Ambulatory Visit: Payer: Self-pay

## 2020-02-22 ENCOUNTER — Ambulatory Visit (INDEPENDENT_AMBULATORY_CARE_PROVIDER_SITE_OTHER): Payer: Medicare HMO

## 2020-02-22 VITALS — BP 136/62 | HR 82 | Temp 97.9°F | Ht 68.0 in | Wt 227.4 lb

## 2020-02-22 DIAGNOSIS — E1165 Type 2 diabetes mellitus with hyperglycemia: Secondary | ICD-10-CM | POA: Diagnosis not present

## 2020-02-22 DIAGNOSIS — F329 Major depressive disorder, single episode, unspecified: Secondary | ICD-10-CM | POA: Diagnosis not present

## 2020-02-22 DIAGNOSIS — R5383 Other fatigue: Secondary | ICD-10-CM | POA: Diagnosis not present

## 2020-02-22 DIAGNOSIS — M79672 Pain in left foot: Secondary | ICD-10-CM | POA: Insufficient documentation

## 2020-02-22 DIAGNOSIS — G629 Polyneuropathy, unspecified: Secondary | ICD-10-CM

## 2020-02-22 DIAGNOSIS — Z1283 Encounter for screening for malignant neoplasm of skin: Secondary | ICD-10-CM

## 2020-02-22 DIAGNOSIS — Z1329 Encounter for screening for other suspected endocrine disorder: Secondary | ICD-10-CM

## 2020-02-22 DIAGNOSIS — F419 Anxiety disorder, unspecified: Secondary | ICD-10-CM

## 2020-02-22 DIAGNOSIS — L989 Disorder of the skin and subcutaneous tissue, unspecified: Secondary | ICD-10-CM

## 2020-02-22 DIAGNOSIS — F32A Depression, unspecified: Secondary | ICD-10-CM | POA: Insufficient documentation

## 2020-02-22 MED ORDER — GABAPENTIN 300 MG PO CAPS: 300.0000 mg | ORAL_CAPSULE | Freq: Every day | ORAL | 3 refills | Status: DC

## 2020-02-22 MED ORDER — ALPRAZOLAM 0.25 MG PO TABS: 0.2500 mg | ORAL_TABLET | Freq: Two times a day (BID) | ORAL | 2 refills | Status: DC | PRN

## 2020-02-22 MED ORDER — ALPHA-LIPOIC ACID 600 MG PO CAPS: 1.0000 | ORAL_CAPSULE | Freq: Two times a day (BID) | ORAL | 3 refills | Status: DC

## 2020-02-22 MED ORDER — FLUOXETINE HCL 10 MG PO CAPS: 20.0000 mg | ORAL_CAPSULE | Freq: Every day | ORAL | 3 refills | Status: DC

## 2020-02-22 MED ORDER — FLUOXETINE HCL 20 MG PO CAPS: 20.0000 mg | ORAL_CAPSULE | Freq: Every day | ORAL | 3 refills | Status: DC

## 2020-02-22 NOTE — Patient Instructions (Addendum)
[facility] call [facility] and figure out how much oxygen you need to be on when walking and if you should be on oxygen while you sleep   zoloft 1/2 pill of 100 mg =50 mg x 1 week then 1/2 pill 50 mg =25 mg x week 2 then stop and start prozac 20 mg day after week 2    We NEED an UPDATED list of meds from your pharmacy     Foot Pain Many things can cause foot pain. Some common causes are:  An injury.  A sprain.  Arthritis.  Blisters.  Bunions. Follow these instructions at home: Managing pain, stiffness, and swelling If directed, put ice on the painful area:  Put ice in a plastic bag.  Place a towel between your skin and the bag.  Leave the ice on for 20 minutes, 2-3 times a day.  Activity  Do not stand or walk for long periods.  Return to your normal activities as told by your health care provider. Ask your health care provider what activities are safe for you.  Do stretches to relieve foot pain and stiffness as told by your health care provider.  Do not lift anything that is heavier than 10 lb (4.5 kg), or the limit that you are told, until your health care provider says that it is safe. Lifting a lot of weight can put added pressure on your feet. Lifestyle  Wear comfortable, supportive shoes that fit you well. Do not wear high heels.  Keep your feet clean and dry. General instructions  Take over-the-counter and prescription medicines only as told by your health care provider.  Rub your foot gently.  Pay attention to any changes in your symptoms.  Keep all follow-up visits as told by your health care provider. This is important. Contact a health care provider if:  Your pain does not get better after a few days of self-care.  Your pain gets worse.  You cannot stand on your foot. Get help right away if:  Your foot is numb or tingling.  Your foot or toes are swollen.  Your foot or toes turn white or blue.  You have warmth and redness along your  foot. Summary  Common causes of foot pain are injury, sprain, arthritis, blisters or bunions.  Ice, medicines, and comfortable shoes may help foot pain.  Contact your health care provider if your pain does not get better after a few days of self-care. This information is not intended to replace advice given to you by your health care provider. Make sure you discuss any questions you have with your health care provider. Document Revised: 08/13/2018 Document Reviewed: 08/13/2018 Elsevier Patient Education  [employer].

## 2020-02-22 NOTE — Progress Notes (Signed)
Pre visit review using our clinic review tool, if applicable. No additional management support is needed unless otherwise documented below in the visit note. 

## 2020-02-22 NOTE — Progress Notes (Addendum)
Chief Complaint  Patient presents with  . Follow-up   F/u with wife sylvia  1. C/o midfoot pain b/l L>R x 3-4 years worsening and 4/10 nothing tried other than foot pad. [facility]  2. DM 2 A1C 6.9 02/24/2019 will repeat labs  3. Leg edema mild per pt on torsemide need to get records from [place] to clarify this we do not have this on med list  4. Lung nodules abnormal PET 08/05/19 bx FNA 08/2019 EBUS neg malignancy I.e CLL/SLL or cancer  5. Anxiety and depression on zoloft 100 mg qd and wants to get back on prozac  6. Skin lesion to left neck and needs tbse    Review of Systems  Constitutional: Negative for weight loss.  HENT: Negative for hearing loss.   Eyes: Negative for blurred vision.  Respiratory: Positive for cough and shortness of breath.   Cardiovascular: Negative for chest pain.  Gastrointestinal: Negative for abdominal pain.  Musculoskeletal: Positive for joint pain.  Skin: Negative for rash.  Neurological: Negative for headaches.  Psychiatric/Behavioral: Positive for depression. The patient is nervous/anxious.        Agitation,irritable and reduce motivation at times     Past Medical History:  Diagnosis Date  . Anemia   . Arthritis   . BPH (benign prostatic hyperplasia)   . Cancer ([place])   . COPD (chronic obstructive pulmonary disease) ([place])   . Emphysema of lung ([place])   . Emphysema of lung ([place])   . Esophageal reflux   . Hyperlipidemia   . Hypertension   . Internal carotid artery stenosis, right   . Neuropathy of both feet   . Pneumonia    recurrent  . PTSD (post-traumatic stress disorder)    [place] vet  . Sebaceous cyst   . SOB (shortness of breath) on exertion   . TIA (transient ischemic attack)   . Urge incontinence    Past Surgical History:  Procedure Laterality Date  . CYST EXCISION     buttocks  . ELECTROMAGNETIC NAVIGATION BROCHOSCOPY Left 08/23/2019   Procedure: ELECTROMAGNETIC NAVIGATION BRONCHOSCOPY;  Surgeon:  Tyler Pita, MD;  Location: [facility] ORS;  Service: Cardiopulmonary;  Laterality: Left;  . ENDOBRONCHIAL ULTRASOUND N/A 08/23/2019   Procedure: ENDOBRONCHIAL ULTRASOUND;  Surgeon: Tyler Pita, MD;  Location: [facility] ORS;  Service: Cardiopulmonary;  Laterality: N/A;  . HEMORRHOID SURGERY    . SINUSOTOMY     Family History  Problem Relation Age of Onset  . Heart disease Mother   . Cancer Maternal Aunt        lung  . Lung cancer Maternal Aunt   . Leukemia Maternal Aunt    Social History   Socioeconomic History  . Marital status: Married    Spouse name: Kathreen Cornfield  . Number of children: 2  . Years of education: Not on file  . Highest education level: Not on file  Occupational History  . Not on file  Tobacco Use  . Smoking status: Former Smoker    Packs/day: 1.50    Years: 50.00    Pack years: 75.00    Types: Cigarettes    Quit date: 09/10/1999    Years since quitting: 20.4  . Smokeless tobacco: Never Used  Substance and Sexual Activity  . Alcohol use: No    Alcohol/week: 0.0 standard drinks    Comment: not since 12/1980  . Drug use: No  . Sexual activity: Yes  Other Topics Concern  .  Not on file  Social History Narrative   Lives in [place] with wife. Dog and cat in home.      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  Dietitian.      Diet - regular      Exercise - Walking   Social Determinants of Health   Financial Resource Strain:   . Difficulty of Paying Living Expenses:   Food Insecurity:   . Worried About Charity fundraiser in the Last Year:   . Arboriculturist in the Last Year:   Transportation Needs:   . Film/video editor (Medical):   Marland Kitchen Lack of Transportation (Non-Medical):   Physical Activity:   . Days of Exercise per Week:   . Minutes of Exercise per Session:   Stress:   . Feeling of Stress :   Social Connections:   . Frequency of Communication with Friends and Family:   . Frequency of Social Gatherings with Friends and Family:    . Attends Religious Services:   . Active Member of Clubs or Organizations:   . Attends Archivist Meetings:   Marland Kitchen Marital Status:   Intimate Partner Violence:   . Fear of Current or Ex-Partner:   . Emotionally Abused:   Marland Kitchen Physically Abused:   . Sexually Abused:    Current Meds  Medication Sig  . albuterol (VENTOLIN HFA) 108 (90 Base) MCG/ACT inhaler Inhale 2 puffs into the lungs every 6 (six) hours as needed for wheezing or shortness of breath.  Marland Kitchen amLODipine (NORVASC) 5 MG tablet Take 1 tablet (5 mg total) by mouth 2 (two) times daily. (Patient taking differently: Take 5 mg by mouth daily. )  . aspirin EC 81 MG tablet Take 81 mg by mouth daily.   Marland Kitchen atorvastatin (LIPITOR) 80 MG tablet Take 40 mg by mouth daily.  . cholecalciferol (VITAMIN D) 1000 UNITS tablet Take 1,000 Units by mouth 2 (two) times daily.   Marland Kitchen desipramine (NOPRAMIN) 10 MG tablet Take 10 mg by mouth at bedtime.  . dutasteride (AVODART) 0.5 MG capsule Take 1 capsule (0.5 mg total) by mouth daily.  . hydrochlorothiazide (HYDRODIURIL) 25 MG tablet Take 25 mg by mouth every other day.  . lisinopril (ZESTRIL) 20 MG tablet Take 20 mg by mouth daily.   . Multiple Vitamins-Minerals (MEGA MULTIVITAMIN FOR MEN) TABS Take 1 tablet by mouth daily.   Marland Kitchen omeprazole (PRILOSEC) 20 MG capsule Take 1 capsule (20 mg total) by mouth daily.  . sertraline (ZOLOFT) 100 MG tablet Take 100 mg by mouth daily.  . tamsulosin (FLOMAX) 0.4 MG CAPS capsule Take 1 capsule (0.4 mg total) by mouth daily.  . valACYclovir (VALTREX) 500 MG tablet Take 1 tablet (500 mg total) by mouth every other day.   Allergies  Allergen Reactions  . Cephalexin Rash   No results found for this or any previous visit (from the past 2160 hour(s)). Objective  Body mass index is 34.58 kg/m. Wt Readings from Last 3 Encounters:  02/22/20 227 lb 6.4 oz (103.1 kg)  08/30/19 223 lb 4.8 oz (101.3 kg)  08/23/19 226 lb (102.5 kg)   Temp Readings from Last 3 Encounters:   02/22/20 97.9 F (36.6 C) (Skin)  08/30/19 98.3 F (36.8 C) (Tympanic)  08/23/19 97.8 F (36.6 C)   BP Readings from Last 3 Encounters:  02/22/20 136/62  08/30/19 (!) 129/54  08/23/19 (!) 157/54   Pulse Readings from Last 3 Encounters:  02/22/20 82  08/30/19 62  08/23/19 77    Physical Exam Vitals and nursing note reviewed.  Constitutional:      Appearance: Normal appearance. He is well-developed and well-groomed. He is obese.  HENT:     Head: Normocephalic and atraumatic.  Eyes:     Conjunctiva/sclera: Conjunctivae normal.     Pupils: Pupils are equal, round, and reactive to light.  Cardiovascular:     Rate and Rhythm: Normal rate and regular rhythm.     Heart sounds: Normal heart sounds. No murmur.  Pulmonary:     Effort: Pulmonary effort is normal.     Breath sounds: Normal breath sounds. No wheezing.  Abdominal:     General: Abdomen is flat. Bowel sounds are normal.     Tenderness: There is no abdominal tenderness.  Skin:    General: Skin is warm and dry.  Neurological:     General: No focal deficit present.     Mental Status: He is alert and oriented to person, place, and time. Mental status is at baseline.     Gait: Gait normal.  Psychiatric:        Attention and Perception: Attention and perception normal.        Mood and Affect: Mood and affect normal.        Speech: Speech normal.        Behavior: Behavior normal. Behavior is cooperative.        Thought Content: Thought content normal.        Cognition and Memory: Cognition and memory normal.        Judgment: Judgment normal.     Assessment  Plan  Left foot pain likely arthritis and  Neuropathy r/o gout - Plan: DG Foot Complete Left, Uric acid Dr. Elvina Mattes in future if foot Xray abnormal  Neuropathy - Plan: gabapentin (NEURONTIN) 300 MG capsule, Alpha-Lipoic Acid 600 MG CAPS bid   Anxiety and depression - Plan: FLUoxetine (PROZAC) 20 MG capsule, ALPRAZolam (XANAX) 0.25 MG tablet  Type 2 diabetes  mellitus with hyperglycemia, without long-term current use of insulin ([place]) - Plan: Comprehensive metabolic panel, Lipid panel, CBC with Differential/Platelet, Hemoglobin A1c, Urinalysis, Routine w reflex microscopic, Microalbumin / creatinine urine ratio   Skin lesion of neck - Plan: Ambulatory referral to Dermatology Dr. Kellie Moor   HM Flu shot due 2020  prev[facility] utd [facility] 08[facility]14 pna 23 utd [facility] had 08/[facility]  Tdap had 09/10/11 or 10/15/11  covid per pt had but not in ncir  -per [facility] had 08/30/14 zostavax  singrix had 12/02/17 and 02/09/18   Colonoscopy 2011 normal per pt consider cologuard until age 80 in future will be 87 11/25/18  PSA last 2.16 10/09/17 out of age window  Smoking history assess at f/u  Skin Dr. Kellie Moor referral today   C/o CLL/SLL f/u with H/o  A1C 6.9 02/24/2019 goal for age <8.0 will monitor   Provider: Dr. Olivia Mackie McLean-Scocuzza-Internal Medicine

## 2020-02-24 ENCOUNTER — Telehealth: Payer: Self-pay | Admitting: Internal Medicine

## 2020-02-24 NOTE — Telephone Encounter (Signed)
Faxed ROI to [facility]) to request Vaccine record and medication list.

## 2020-02-25 NOTE — Progress Notes (Signed)
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   INTERVAL HISTORY: Patient returns to clinic today for further evaluation and discussion of his imaging results.  He continues to feel well and remains asymptomatic. He has no neurologic complaints.  He denies any recent fevers or illnesses.  He has a good appetite and denies weight loss.  He denies any night sweats.  He has no chest pain, cough, hemoptysis, or shortness of breath.  He denies any nausea, vomiting, constipation, or diarrhea.  He has no urinary complaints.  Patient feels at his baseline offers no specific complaints today.  Review of Systems  Constitutional: Negative.  Negative for fever, malaise/fatigue and weight loss.  Respiratory: Negative.   Cardiovascular: Negative.  Negative for chest pain and leg swelling.  Gastrointestinal: Negative.  Negative for abdominal pain.  Genitourinary: Negative.  Negative for dysuria.  Musculoskeletal: Negative.  Negative for back pain.  Skin: Negative.  Negative for rash.  Neurological: Negative.  Negative for dizziness, focal weakness, weakness and headaches.  Psychiatric/Behavioral: Negative.  The patient is not nervous/anxious.    As per HPI. Otherwise, a complete review of systems is negative.  PAST MEDICAL HISTORY: Past Medical History:  Diagnosis Date  . Anemia   . Arthritis   . BPH (benign prostatic hyperplasia)   . Cancer ([place])   . COPD (chronic obstructive pulmonary disease) ([place])   . Emphysema of lung ([place])   . Emphysema of lung ([place])   .  Esophageal reflux   . Hyperlipidemia   . Hypertension   . Internal carotid artery stenosis, right   . Neuropathy of both feet   . Pneumonia    recurrent  . PTSD (post-traumatic stress disorder)    [place] vet  . Sebaceous cyst   . SOB (shortness of breath) on exertion   . TIA (transient ischemic attack)   . Urge incontinence     PAST SURGICAL HISTORY: Past Surgical History:  Procedure Laterality Date  . CYST EXCISION     buttocks  . ELECTROMAGNETIC NAVIGATION BROCHOSCOPY Left 08/23/2019   Procedure: ELECTROMAGNETIC NAVIGATION BRONCHOSCOPY;  Surgeon: Tyler Pita, MD;  Location: [facility] ORS;  Service: Cardiopulmonary;  Laterality: Left;  . ENDOBRONCHIAL ULTRASOUND N/A 08/23/2019   Procedure: ENDOBRONCHIAL ULTRASOUND;  Surgeon: Tyler Pita, MD;  Location: [facility] ORS;  Service: Cardiopulmonary;  Laterality: N/A;  . HEMORRHOID SURGERY    . SINUSOTOMY      FAMILY HISTORY: Family History  Problem Relation Age of Onset  . Heart disease Mother   . Cancer Maternal Aunt        lung  . Lung cancer Maternal Aunt   . Leukemia Maternal Aunt     ADVANCED DIRECTIVES (Y/N):  N  HEALTH MAINTENANCE: Social History   Tobacco Use  . Smoking status: Former Smoker    Packs/day: 1.50    Years: 50.00    Pack years: 75.00    Types: Cigarettes    Quit date: 09/10/1999    Years since quitting: 20.4  . Smokeless tobacco: Never Used  Substance Use Topics  . Alcohol use: No    Alcohol/week: 0.0 standard drinks  Comment: not since 12/1980  . Drug use: No     Colonoscopy:  PAP:  Bone density:  Lipid panel:  Allergies  Allergen Reactions  . Cephalexin Rash    Current Outpatient Medications  Medication Sig Dispense Refill  . albuterol (VENTOLIN HFA) 108 (90 Base) MCG/ACT inhaler Inhale 2 puffs into the lungs every 6 (six) hours as needed for wheezing or shortness of breath. 18 g 6  . Alpha-Lipoic Acid 600 MG CAPS Take 1 capsule (600 mg total) by mouth in the morning and  at bedtime. 180 capsule 3  . ALPRAZolam (XANAX) 0.25 MG tablet Take 1 tablet (0.25 mg total) by mouth 2 (two) times daily as needed for anxiety. 30 tablet 2  . amLODipine (NORVASC) 5 MG tablet Take 1 tablet (5 mg total) by mouth 2 (two) times daily. (Patient taking differently: Take 5 mg by mouth daily. ) 180 tablet 1  . aspirin EC 81 MG tablet Take 81 mg by mouth daily.     Marland Kitchen atorvastatin (LIPITOR) 80 MG tablet Take 40 mg by mouth daily.    . cetirizine (ZYRTEC) 10 MG tablet Take 1 tablet (10 mg total) by mouth daily. 30 tablet 11  . cholecalciferol (VITAMIN D) 1000 UNITS tablet Take 1,000 Units by mouth 2 (two) times daily.     . DULoxetine (CYMBALTA) 20 MG capsule Take 1 capsule (20 mg total) by mouth daily.  3  . dutasteride (AVODART) 0.5 MG capsule Take 1 capsule (0.5 mg total) by mouth daily. 90 capsule 2  . famotidine (PEPCID) 20 MG tablet Take 1 tablet (20 mg total) by mouth daily as needed for heartburn or indigestion.    Derrill Memo ON 03/07/2020] FLUoxetine (PROZAC) 20 MG capsule Take 1 capsule (20 mg total) by mouth daily. 90 capsule 3  . gabapentin (NEURONTIN) 300 MG capsule Take 1 capsule (300 mg total) by mouth at bedtime. 90 capsule 3  . hydrochlorothiazide (HYDRODIURIL) 25 MG tablet Take 0.5 tablets (12.5 mg total) by mouth daily.    Marland Kitchen lisinopril (ZESTRIL) 10 MG tablet Take 0.5 tablets (5 mg total) by mouth daily. 15 tablet 11  . Multiple Vitamins-Minerals (MEGA MULTIVITAMIN FOR MEN) TABS Take 1 tablet by mouth daily.     Marland Kitchen omeprazole (PRILOSEC) 40 MG capsule Take 1 capsule (40 mg total) by mouth daily.    . tamsulosin (FLOMAX) 0.4 MG CAPS capsule Take 1 capsule (0.4 mg total) by mouth daily. 90 capsule 2  . Tiotropium Bromide-Olodaterol 2.5-2.5 MCG/ACT AERS Inhale 2 puffs into the lungs daily.    . valACYclovir (VALTREX) 500 MG tablet Take 1 tablet (500 mg total) by mouth every other day. 15 tablet 3   No current facility-administered medications for this visit.     OBJECTIVE: Vitals:   03/02/20 1107  BP: (!) 157/49  Pulse: (!) 56  Resp: 20  Temp: (!) 96.9 F (36.1 C)  SpO2: 98%     Body mass index is 34.86 kg/m.    ECOG FS:0 - Asymptomatic  General: Well-developed, well-nourished, no acute distress. Eyes: Pink conjunctiva, anicteric sclera. HEENT: Normocephalic, moist mucous membranes. Lungs: No audible wheezing or coughing. Heart: Regular rate and rhythm. Abdomen: Soft, nontender, no obvious distention. Musculoskeletal: No edema, cyanosis, or clubbing. Neuro: Alert, answering all questions appropriately. Cranial nerves grossly intact. Skin: No rashes or petechiae noted. Psych: Normal affect.   LAB RESULTS:  Lab Results  Component Value Date   NA 141 02/28/2020   K 4.7 02/28/2020   CL 110  02/28/2020   CO2 23 02/28/2020   GLUCOSE 159 (H) 02/28/2020   BUN 24 (H) 02/28/2020   CREATININE 1.54 (H) 02/28/2020   CALCIUM 9.1 02/28/2020   PROT 6.7 12/29/2018   ALBUMIN 4.1 12/29/2018   AST 20 12/29/2018   ALT 17 12/29/2018   ALKPHOS 62 12/29/2018   BILITOT 0.3 12/29/2018   GFRNONAA 41 (L) 02/28/2020   GFRAA 47 (L) 02/28/2020    Lab Results  Component Value Date   WBC 9.6 02/28/2020   NEUTROABS 4.2 02/28/2020   HGB 10.0 (L) 02/28/2020   HCT 32.3 (L) 02/28/2020   MCV 90.0 02/28/2020   PLT 184 02/28/2020     STUDIES: CT Chest W Contrast  Result Date: 02/28/2020 CLINICAL DATA:  Follow up pulmonary nodules. Chronic lymphocytic leukemia without current therapy. EXAM: CT CHEST WITH CONTRAST TECHNIQUE: Multidetector CT imaging of the chest was performed during intravenous contrast administration. CONTRAST:  47mL OMNIPAQUE IOHEXOL 300 MG/ML  SOLN COMPARISON:  Chest CT 07/27/2019 and 01/21/2019. PET-CT 08/05/2019. FINDINGS: Cardiovascular: Moderate atherosclerosis of the aorta, great vessels and coronary arteries. No acute vascular findings. The heart size is normal. There is no pericardial effusion. Mediastinum/Nodes: Again  demonstrated are multiple mildly enlarged mediastinal, hilar and axillary lymph nodes bilaterally. Right paratracheal node measures 19 mm on image 44/2 (previously 17 mm, remeasured). Subcarinal node measures 20 mm on image 71/2 (previously 19 mm, remeasured). The thyroid gland, trachea and esophagus demonstrate no significant findings. Lungs/Pleura: No pleural effusion or pneumothorax. Mild emphysema and chronic central airway thickening. There is increased coalescence of the clustered nodules in the left upper lobe, now measuring up to 1.4 x 0.8 cm on image 25/3. This compares with 1.2 x 0.6 cm previously. There is increased, ill-defined tree-in-bud nodularity in the superior segment of the right lower lobe. Other scattered small nodules are stable. Upper abdomen: Multiple prominent lymph nodes in the base of the mesentery are stable. There is diffuse fatty replacement of the pancreas. No adrenal mass or splenomegaly. Musculoskeletal/Chest wall: There is no chest wall mass or suspicious osseous finding. IMPRESSION: 1. Increased coalescence and further enlargement of clustered nodules in the left upper lobe, hypermetabolic on PET-CT and worrisome for bronchogenic carcinoma (likely adenocarcinoma). 2. New ill-defined nodules in the superior segment of the right lower lobe, most likely inflammatory. 3. Stable mildly enlarged mediastinal, hilar and axillary lymph nodes, consistent with chronic lymphocytic leukemia. 4. Aortic Atherosclerosis (ICD10-I70.0) and Emphysema (ICD10-J43.9). Electronically Signed   By: Richardean Sale M.D.   On: 02/28/2020 13:15   DG Foot Complete Left  Result Date: 02/22/2020 CLINICAL DATA:  Mid foot pain EXAM: LEFT FOOT - COMPLETE 3+ VIEW COMPARISON:  None. FINDINGS: No fracture or malalignment. Large plantar calcaneal spur. Degenerative changes at the first and fifth MTP joints. IMPRESSION: No acute osseous abnormality. Large plantar calcaneal spur. Degenerative changes at the first and  fifth MTP joints. Electronically Signed   By: Donavan Foil M.D.   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  CT scan results from July 27, 2019 reviewed independently with moderately progressive lymphadenopathy in bilateral axilla, mediastinal, retroperitoneal, iliac, and inguinal lymph nodes.  PET scan results from August 05, 2019 with minimal metabolic changes to known lymphadenopathy compatible with a chronic low-grade lymphoproliferative disorder.  Previously, his flow cytometry panel reported a CD5+ and CD23+ clonal B-cell population, highly suspicious  for CLL/SLL.  Patient does not require biopsy, but will consider one if patient required treatment.  Continue to monitor closely.   2.  Left upper lobe pulmonary nodule: Despite lesion being hypermetabolic on PET scan from August 05, 2019, subsequent navigational biopsy was inconclusive.  CT scan results from February 28, 2020 reviewed independently and reported as above with interval increase in size of lesion.  Patient was given a referral back to pulmonology for consideration of repeat navigational biopsy.  Follow-up will be determined based on the results of his biopsy.   3.  Anemia: Chronic and unchanged, monitor.  Patient expressed understanding and was in agreement with this plan. He also understands that He can call clinic at any time with any questions, concerns, or complaints.   Cancer Staging No matching staging information was found for the patient.  Lloyd Huger, MD   03/03/2020 6:57 AM

## 2020-02-28 ENCOUNTER — Other Ambulatory Visit: Payer: Self-pay | Admitting: Internal Medicine

## 2020-02-28 ENCOUNTER — Ambulatory Visit
Admission: RE | Admit: 2020-02-28 | Discharge: 2020-02-28 | Disposition: A | Discharge status: Home / Self Care | Payer: Medicare HMO | Source: Ambulatory Visit | Attending: Oncology | Admitting: Oncology

## 2020-02-28 ENCOUNTER — Telehealth: Payer: Self-pay | Admitting: Internal Medicine

## 2020-02-28 ENCOUNTER — Other Ambulatory Visit: Payer: Self-pay

## 2020-02-28 ENCOUNTER — Inpatient Hospital Stay: Payer: Medicare HMO | Attending: Oncology

## 2020-02-28 DIAGNOSIS — E785 Hyperlipidemia, unspecified: Secondary | ICD-10-CM | POA: Diagnosis not present

## 2020-02-28 DIAGNOSIS — J449 Chronic obstructive pulmonary disease, unspecified: Secondary | ICD-10-CM | POA: Insufficient documentation

## 2020-02-28 DIAGNOSIS — Z7982 Long term (current) use of aspirin: Secondary | ICD-10-CM | POA: Diagnosis not present

## 2020-02-28 DIAGNOSIS — Z8673 Personal history of transient ischemic attack (TIA), and cerebral infarction without residual deficits: Secondary | ICD-10-CM | POA: Diagnosis not present

## 2020-02-28 DIAGNOSIS — J309 Allergic rhinitis, unspecified: Secondary | ICD-10-CM

## 2020-02-28 DIAGNOSIS — I10 Essential (primary) hypertension: Secondary | ICD-10-CM

## 2020-02-28 DIAGNOSIS — K219 Gastro-esophageal reflux disease without esophagitis: Secondary | ICD-10-CM | POA: Diagnosis not present

## 2020-02-28 DIAGNOSIS — Z806 Family history of leukemia: Secondary | ICD-10-CM | POA: Insufficient documentation

## 2020-02-28 DIAGNOSIS — R911 Solitary pulmonary nodule: Secondary | ICD-10-CM | POA: Diagnosis not present

## 2020-02-28 DIAGNOSIS — N400 Enlarged prostate without lower urinary tract symptoms: Secondary | ICD-10-CM | POA: Insufficient documentation

## 2020-02-28 DIAGNOSIS — C9110 Chronic lymphocytic leukemia of B-cell type not having achieved remission: Secondary | ICD-10-CM | POA: Insufficient documentation

## 2020-02-28 DIAGNOSIS — D649 Anemia, unspecified: Secondary | ICD-10-CM | POA: Diagnosis not present

## 2020-02-28 DIAGNOSIS — Z79899 Other long term (current) drug therapy: Secondary | ICD-10-CM | POA: Diagnosis not present

## 2020-02-28 DIAGNOSIS — Z801 Family history of malignant neoplasm of trachea, bronchus and lung: Secondary | ICD-10-CM | POA: Insufficient documentation

## 2020-02-28 DIAGNOSIS — R918 Other nonspecific abnormal finding of lung field: Secondary | ICD-10-CM | POA: Diagnosis not present

## 2020-02-28 DIAGNOSIS — F32A Depression, unspecified: Secondary | ICD-10-CM

## 2020-02-28 DIAGNOSIS — Z87891 Personal history of nicotine dependence: Secondary | ICD-10-CM | POA: Insufficient documentation

## 2020-02-28 DIAGNOSIS — F329 Major depressive disorder, single episode, unspecified: Secondary | ICD-10-CM

## 2020-02-28 DIAGNOSIS — Z8249 Family history of ischemic heart disease and other diseases of the circulatory system: Secondary | ICD-10-CM | POA: Diagnosis not present

## 2020-02-28 DIAGNOSIS — F419 Anxiety disorder, unspecified: Secondary | ICD-10-CM

## 2020-02-28 LAB — CBC WITH DIFFERENTIAL/PLATELET
Abs Immature Granulocytes: 0.05 10*3/uL (ref 0.00–0.07)
Basophils Absolute: 0.1 10*3/uL (ref 0.0–0.1)
Basophils Relative: 1 %
Eosinophils Absolute: 0.7 10*3/uL — ABNORMAL HIGH (ref 0.0–0.5)
Eosinophils Relative: 7 %
HCT: 32.3 % — ABNORMAL LOW (ref 39.0–52.0)
Hemoglobin: 10 g/dL — ABNORMAL LOW (ref 13.0–17.0)
Immature Granulocytes: 1 %
Lymphocytes Relative: 39 %
Lymphs Abs: 3.8 10*3/uL (ref 0.7–4.0)
MCH: 27.9 pg (ref 26.0–34.0)
MCHC: 31 g/dL (ref 30.0–36.0)
MCV: 90 fL (ref 80.0–100.0)
Monocytes Absolute: 0.8 10*3/uL (ref 0.1–1.0)
Monocytes Relative: 8 %
Neutro Abs: 4.2 10*3/uL (ref 1.7–7.7)
Neutrophils Relative %: 44 %
Platelets: 184 10*3/uL (ref 150–400)
RBC: 3.59 MIL/uL — ABNORMAL LOW (ref 4.22–5.81)
RDW: 14.5 % (ref 11.5–15.5)
WBC: 9.6 10*3/uL (ref 4.0–10.5)
nRBC: 0 % (ref 0.0–0.2)

## 2020-02-28 LAB — BASIC METABOLIC PANEL
Anion gap: 8 (ref 5–15)
BUN: 24 mg/dL — ABNORMAL HIGH (ref 8–23)
CO2: 23 mmol/L (ref 22–32)
Calcium: 9.1 mg/dL (ref 8.9–10.3)
Chloride: 110 mmol/L (ref 98–111)
Creatinine, Ser: 1.54 mg/dL — ABNORMAL HIGH (ref 0.61–1.24)
GFR calc Af Amer: 47 mL/min — ABNORMAL LOW (ref >60)
GFR calc non Af Amer: 41 mL/min — ABNORMAL LOW (ref >60)
Glucose, Bld: 159 mg/dL — ABNORMAL HIGH (ref 70–99)
Potassium: 4.7 mmol/L (ref 3.5–5.1)
Sodium: 141 mmol/L (ref 135–145)

## 2020-02-28 MED ORDER — TIOTROPIUM BROMIDE-OLODATEROL 2.5-2.5 MCG/ACT IN AERS: 2.0000 | INHALATION_SPRAY | Freq: Every day | RESPIRATORY_TRACT | Status: DC

## 2020-02-28 MED ORDER — DULOXETINE HCL 20 MG PO CPEP: 20.0000 mg | ORAL_CAPSULE | Freq: Every day | ORAL | 3 refills | Status: DC

## 2020-02-28 MED ORDER — LISINOPRIL 10 MG PO TABS: 5.0000 mg | ORAL_TABLET | Freq: Every day | ORAL | 11 refills | Status: DC

## 2020-02-28 MED ORDER — FAMOTIDINE 20 MG PO TABS: 20.0000 mg | ORAL_TABLET | Freq: Every day | ORAL | Status: DC | PRN

## 2020-02-28 MED ORDER — HYDROCHLOROTHIAZIDE 25 MG PO TABS: 12.5000 mg | ORAL_TABLET | Freq: Every day | ORAL | Status: DC

## 2020-02-28 MED ORDER — OMEPRAZOLE 40 MG PO CPDR: 40.0000 mg | DELAYED_RELEASE_CAPSULE | Freq: Every day | ORAL | Status: DC

## 2020-02-28 MED ORDER — IOHEXOL 300 MG/ML  SOLN
75.0000 mL | Freq: Once | INTRAMUSCULAR | Status: AC | PRN
  Administered 2020-02-28: 75 mL via INTRAVENOUS

## 2020-02-28 MED ORDER — CETIRIZINE HCL 10 MG PO TABS: 10.0000 mg | ORAL_TABLET | Freq: Every day | ORAL | 11 refills | Status: DC

## 2020-02-28 NOTE — Telephone Encounter (Signed)
I updated med list sent 2 to printer  1. Mail 1 copy to pt and put a note to verify his pill bottles match at home  2. Fax another copy to the [facility] and noted updated med list as of 02/28/20 and pt stopped zoloft 100 mg feels prozac works better and fax copy of my last note  Phone # [phone]  Last 4 of his SS # [id number]  Thanks [place]

## 2020-02-28 NOTE — Telephone Encounter (Signed)
Donald [name], [place]  02/28/2020 2:42 PM EDT    No answer, no voicemail.   3rd attempt letter mailed with results and to call into office.    Donald [name], [place]  02/25/2020 9:55 AM EDT    No answer, no voicemail. Call went straight to voicemail. Second attempt.    Donald [name], [place]  02/24/2020 4:23 PM EDT    No answer, no voicemail.    McLean-Scocuzza, Nino Glow, MD  02/23/2020 10:14 AM EDT    Arthritis in foot and heel spur  Does he want to see Dr. Elvina Mattes ?

## 2020-02-29 NOTE — Telephone Encounter (Signed)
Medication List faxed to [facility] on 02/29/20 Faxed to [phone]

## 2020-03-01 NOTE — Telephone Encounter (Signed)
Sent last note to fax

## 2020-03-01 NOTE — Telephone Encounter (Signed)
Last office note faxed to [facility] on 4/21 Faxed confirmed

## 2020-03-02 ENCOUNTER — Other Ambulatory Visit: Payer: Self-pay

## 2020-03-02 ENCOUNTER — Inpatient Hospital Stay (HOSPITAL_BASED_OUTPATIENT_CLINIC_OR_DEPARTMENT_OTHER): Payer: Medicare HMO | Admitting: Oncology

## 2020-03-02 ENCOUNTER — Encounter: Payer: Self-pay | Admitting: Oncology

## 2020-03-02 VITALS — BP 157/49 | HR 56 | Temp 96.9°F | Resp 20 | Wt 229.3 lb

## 2020-03-02 DIAGNOSIS — I10 Essential (primary) hypertension: Secondary | ICD-10-CM | POA: Diagnosis not present

## 2020-03-02 DIAGNOSIS — R911 Solitary pulmonary nodule: Secondary | ICD-10-CM | POA: Diagnosis not present

## 2020-03-02 DIAGNOSIS — Z87891 Personal history of nicotine dependence: Secondary | ICD-10-CM | POA: Diagnosis not present

## 2020-03-02 DIAGNOSIS — D649 Anemia, unspecified: Secondary | ICD-10-CM | POA: Diagnosis not present

## 2020-03-02 DIAGNOSIS — K219 Gastro-esophageal reflux disease without esophagitis: Secondary | ICD-10-CM | POA: Diagnosis not present

## 2020-03-02 DIAGNOSIS — J449 Chronic obstructive pulmonary disease, unspecified: Secondary | ICD-10-CM | POA: Diagnosis not present

## 2020-03-02 DIAGNOSIS — C9110 Chronic lymphocytic leukemia of B-cell type not having achieved remission: Secondary | ICD-10-CM

## 2020-03-02 DIAGNOSIS — E785 Hyperlipidemia, unspecified: Secondary | ICD-10-CM | POA: Diagnosis not present

## 2020-03-02 DIAGNOSIS — N400 Enlarged prostate without lower urinary tract symptoms: Secondary | ICD-10-CM | POA: Diagnosis not present

## 2020-03-02 DIAGNOSIS — Z8673 Personal history of transient ischemic attack (TIA), and cerebral infarction without residual deficits: Secondary | ICD-10-CM | POA: Diagnosis not present

## 2020-03-02 NOTE — Progress Notes (Signed)
Patient here today for follow up on CT. States he is not having any pain or concerns. Would like to discuss the new growth that was found and removing it. Does states he has some trouble swallowing from time to time.

## 2020-03-03 ENCOUNTER — Other Ambulatory Visit: Payer: Medicare HMO

## 2020-03-06 ENCOUNTER — Other Ambulatory Visit: Payer: Medicare HMO

## 2020-03-06 ENCOUNTER — Other Ambulatory Visit: Payer: Self-pay

## 2020-03-06 ENCOUNTER — Other Ambulatory Visit (INDEPENDENT_AMBULATORY_CARE_PROVIDER_SITE_OTHER): Payer: Medicare HMO

## 2020-03-06 DIAGNOSIS — R5383 Other fatigue: Secondary | ICD-10-CM

## 2020-03-06 DIAGNOSIS — E1165 Type 2 diabetes mellitus with hyperglycemia: Secondary | ICD-10-CM

## 2020-03-06 DIAGNOSIS — M79672 Pain in left foot: Secondary | ICD-10-CM | POA: Diagnosis not present

## 2020-03-06 DIAGNOSIS — Z1329 Encounter for screening for other suspected endocrine disorder: Secondary | ICD-10-CM | POA: Diagnosis not present

## 2020-03-06 DIAGNOSIS — E1142 Type 2 diabetes mellitus with diabetic polyneuropathy: Secondary | ICD-10-CM | POA: Diagnosis not present

## 2020-03-06 LAB — LIPID PANEL
Cholesterol: 192 mg/dL (ref 0–200)
HDL: 52.3 mg/dL (ref >39.00)
LDL Cholesterol: 105 mg/dL — ABNORMAL HIGH (ref 0–99)
NonHDL: 139.86
Total CHOL/HDL Ratio: 4
Triglycerides: 172 mg/dL — ABNORMAL HIGH (ref 0.0–149.0)
VLDL: 34.4 mg/dL (ref 0.0–40.0)

## 2020-03-06 LAB — CBC WITH DIFFERENTIAL/PLATELET
Basophils Absolute: 0.1 10*3/uL (ref 0.0–0.1)
Basophils Relative: 0.8 % (ref 0.0–3.0)
Eosinophils Absolute: 0.6 10*3/uL (ref 0.0–0.7)
Eosinophils Relative: 6.2 % — ABNORMAL HIGH (ref 0.0–5.0)
HCT: 31.2 % — ABNORMAL LOW (ref 39.0–52.0)
Hemoglobin: 10.3 g/dL — ABNORMAL LOW (ref 13.0–17.0)
Lymphocytes Relative: 43.4 % (ref 12.0–46.0)
Lymphs Abs: 4 10*3/uL (ref 0.7–4.0)
MCHC: 33.1 g/dL (ref 30.0–36.0)
MCV: 86.5 fl (ref 78.0–100.0)
Monocytes Absolute: 0.5 10*3/uL (ref 0.1–1.0)
Monocytes Relative: 5.9 % (ref 3.0–12.0)
Neutro Abs: 4 10*3/uL (ref 1.4–7.7)
Neutrophils Relative %: 43.7 % (ref 43.0–77.0)
Platelets: 214 10*3/uL (ref 150.0–400.0)
RBC: 3.61 Mil/uL — ABNORMAL LOW (ref 4.22–5.81)
RDW: 15.5 % (ref 11.5–15.5)
WBC: 9.2 10*3/uL (ref 4.0–10.5)

## 2020-03-06 LAB — TSH: TSH: 4.27 u[IU]/mL (ref 0.35–4.50)

## 2020-03-06 LAB — COMPREHENSIVE METABOLIC PANEL
ALT: 15 U/L (ref 0–53)
AST: 18 U/L (ref 0–37)
Albumin: 4.3 g/dL (ref 3.5–5.2)
Alkaline Phosphatase: 80 U/L (ref 39–117)
BUN: 22 mg/dL (ref 6–23)
CO2: 25 mEq/L (ref 19–32)
Calcium: 9.2 mg/dL (ref 8.4–10.5)
Chloride: 106 mEq/L (ref 96–112)
Creatinine, Ser: 1.43 mg/dL (ref 0.40–1.50)
GFR: 46.97 mL/min — ABNORMAL LOW (ref >60.00)
Glucose, Bld: 113 mg/dL — ABNORMAL HIGH (ref 70–99)
Potassium: 4.5 mEq/L (ref 3.5–5.1)
Sodium: 139 mEq/L (ref 135–145)
Total Bilirubin: 0.3 mg/dL (ref 0.2–1.2)
Total Protein: 6.5 g/dL (ref 6.0–8.3)

## 2020-03-06 LAB — HEMOGLOBIN A1C: Hgb A1c MFr Bld: 6.4 % (ref 4.6–6.5)

## 2020-03-06 LAB — URIC ACID: Uric Acid, Serum: 5.2 mg/dL (ref 4.0–7.8)

## 2020-03-06 NOTE — Addendum Note (Signed)
Addended by: Tor Netters I on: 03/06/2020 10:07 AM   Modules accepted: Orders

## 2020-03-08 ENCOUNTER — Telehealth: Payer: Self-pay | Admitting: Internal Medicine

## 2020-03-08 NOTE — Telephone Encounter (Signed)
Mailed updated med list to patient's address on file. Sent mychart notifying the patient.

## 2020-03-09 DIAGNOSIS — J449 Chronic obstructive pulmonary disease, unspecified: Secondary | ICD-10-CM | POA: Diagnosis not present

## 2020-03-13 ENCOUNTER — Other Ambulatory Visit: Payer: Self-pay | Admitting: Emergency Medicine

## 2020-03-13 DIAGNOSIS — R911 Solitary pulmonary nodule: Secondary | ICD-10-CM

## 2020-03-13 DIAGNOSIS — D485 Neoplasm of uncertain behavior of skin: Secondary | ICD-10-CM | POA: Diagnosis not present

## 2020-03-13 DIAGNOSIS — D2272 Melanocytic nevi of left lower limb, including hip: Secondary | ICD-10-CM | POA: Diagnosis not present

## 2020-03-13 DIAGNOSIS — L821 Other seborrheic keratosis: Secondary | ICD-10-CM | POA: Diagnosis not present

## 2020-03-13 DIAGNOSIS — L820 Inflamed seborrheic keratosis: Secondary | ICD-10-CM | POA: Diagnosis not present

## 2020-03-13 DIAGNOSIS — D225 Melanocytic nevi of trunk: Secondary | ICD-10-CM | POA: Diagnosis not present

## 2020-03-13 HISTORY — PX: OTHER SURGICAL HISTORY: SHX169

## 2020-03-16 NOTE — Telephone Encounter (Signed)
ROI sent to scan

## 2020-03-21 DIAGNOSIS — G459 Transient cerebral ischemic attack, unspecified: Secondary | ICD-10-CM | POA: Diagnosis not present

## 2020-03-21 DIAGNOSIS — R001 Bradycardia, unspecified: Secondary | ICD-10-CM | POA: Diagnosis not present

## 2020-03-21 DIAGNOSIS — I6523 Occlusion and stenosis of bilateral carotid arteries: Secondary | ICD-10-CM | POA: Diagnosis not present

## 2020-03-21 DIAGNOSIS — I10 Essential (primary) hypertension: Secondary | ICD-10-CM | POA: Diagnosis not present

## 2020-03-21 DIAGNOSIS — N1831 Chronic kidney disease, stage 3a: Secondary | ICD-10-CM | POA: Diagnosis not present

## 2020-03-21 DIAGNOSIS — E7849 Other hyperlipidemia: Secondary | ICD-10-CM | POA: Diagnosis not present

## 2020-04-02 DIAGNOSIS — J441 Chronic obstructive pulmonary disease with (acute) exacerbation: Secondary | ICD-10-CM | POA: Diagnosis not present

## 2020-04-02 DIAGNOSIS — J029 Acute pharyngitis, unspecified: Secondary | ICD-10-CM | POA: Diagnosis not present

## 2020-04-02 DIAGNOSIS — Z20822 Contact with and (suspected) exposure to covid-19: Secondary | ICD-10-CM | POA: Diagnosis not present

## 2020-04-02 DIAGNOSIS — J019 Acute sinusitis, unspecified: Secondary | ICD-10-CM | POA: Diagnosis not present

## 2020-04-02 DIAGNOSIS — B9689 Other specified bacterial agents as the cause of diseases classified elsewhere: Secondary | ICD-10-CM | POA: Diagnosis not present

## 2020-04-02 DIAGNOSIS — J209 Acute bronchitis, unspecified: Secondary | ICD-10-CM | POA: Diagnosis not present

## 2020-04-06 ENCOUNTER — Telehealth: Payer: Self-pay | Admitting: Internal Medicine

## 2020-04-06 ENCOUNTER — Ambulatory Visit: Payer: Medicare HMO | Admitting: Pulmonary Disease

## 2020-04-06 NOTE — Telephone Encounter (Signed)
Ottie Glazier, MD  [address], [place] [address]  [phone]  [phone] (Fax)   PT called today 04/06/20 coughing up blood needs appts   [place]

## 2020-04-11 ENCOUNTER — Telehealth: Payer: Self-pay | Admitting: Internal Medicine

## 2020-04-11 NOTE — Telephone Encounter (Signed)
Called [facility] Pulmonology and spoke with Caryl Pina. Informed them of what was going on with the patient. Dr A is out of the office until next week. Patient scheduled for 04/20/20 at 8:30 am.  Called and informed the patient of this. Informed him this was the soonest appointment and if things got worse he would need to go to the ED.  Patient states he already has a procedure scheduled for this day. Gave the patient the number to call and be re-scheduled ( [phone] ). Informed the patient he needed to be seen as soon as possible. Patient verbalized understanding

## 2020-04-11 NOTE — Telephone Encounter (Signed)
Pt has tried to call [facility] since last week pulm to get appt for coughing blood with h/o abnormal cT scan of chest  -no one has called him back  Can we call and make appt or have [place] call pt I called 5/28 prior to close but was on hold and no one came to the phone   [place]

## 2020-04-26 DIAGNOSIS — J432 Centrilobular emphysema: Secondary | ICD-10-CM | POA: Diagnosis not present

## 2020-04-27 ENCOUNTER — Encounter: Payer: Self-pay | Admitting: Internal Medicine

## 2020-04-27 ENCOUNTER — Other Ambulatory Visit: Payer: Self-pay

## 2020-04-27 ENCOUNTER — Ambulatory Visit (INDEPENDENT_AMBULATORY_CARE_PROVIDER_SITE_OTHER): Payer: Medicare HMO | Admitting: Internal Medicine

## 2020-04-27 VITALS — BP 140/56 | HR 57 | Temp 98.1°F | Ht 68.0 in | Wt 225.8 lb

## 2020-04-27 DIAGNOSIS — R3914 Feeling of incomplete bladder emptying: Secondary | ICD-10-CM | POA: Diagnosis not present

## 2020-04-27 DIAGNOSIS — N320 Bladder-neck obstruction: Secondary | ICD-10-CM | POA: Diagnosis not present

## 2020-04-27 DIAGNOSIS — I10 Essential (primary) hypertension: Secondary | ICD-10-CM

## 2020-04-27 DIAGNOSIS — N401 Enlarged prostate with lower urinary tract symptoms: Secondary | ICD-10-CM | POA: Diagnosis not present

## 2020-04-27 DIAGNOSIS — R413 Other amnesia: Secondary | ICD-10-CM

## 2020-04-27 DIAGNOSIS — F419 Anxiety disorder, unspecified: Secondary | ICD-10-CM

## 2020-04-27 DIAGNOSIS — R7303 Prediabetes: Secondary | ICD-10-CM | POA: Diagnosis not present

## 2020-04-27 DIAGNOSIS — R918 Other nonspecific abnormal finding of lung field: Secondary | ICD-10-CM

## 2020-04-27 DIAGNOSIS — J439 Emphysema, unspecified: Secondary | ICD-10-CM | POA: Diagnosis not present

## 2020-04-27 DIAGNOSIS — F329 Major depressive disorder, single episode, unspecified: Secondary | ICD-10-CM

## 2020-04-27 DIAGNOSIS — F32A Depression, unspecified: Secondary | ICD-10-CM

## 2020-04-27 MED ORDER — LISINOPRIL 10 MG PO TABS: 10.0000 mg | ORAL_TABLET | Freq: Every day | ORAL | 3 refills | Status: DC

## 2020-04-27 MED ORDER — DULOXETINE HCL 30 MG PO CPEP: 30.0000 mg | ORAL_CAPSULE | Freq: Every day | ORAL | Status: DC

## 2020-04-27 NOTE — Patient Instructions (Addendum)
Medications for blood pressure norvasc 5 mg daily  hctz 25 mg daily  Lisinopril 10 mg daily   DASH Eating Plan DASH stands for "Dietary Approaches to Stop Hypertension." The DASH eating plan is a healthy eating plan that has been shown to reduce high blood pressure (hypertension). It may also reduce your risk for type 2 diabetes, heart disease, and stroke. The DASH eating plan may also help with weight loss. What are tips for following this plan?  General guidelines  Avoid eating more than 2,300 mg (milligrams) of salt (sodium) a day. If you have hypertension, you may need to reduce your sodium intake to 1,500 mg a day.  Limit alcohol intake to no more than 1 drink a day for nonpregnant women and 2 drinks a day for men. One drink equals 12 oz of beer, 5 oz of wine, or 1 oz of hard liquor.  Work with your health care provider to maintain a healthy body weight or to lose weight. Ask what an ideal weight is for you.  Get at least 30 minutes of exercise that causes your heart to beat faster (aerobic exercise) most days of the week. Activities may include walking, swimming, or biking.  Work with your health care provider or diet and nutrition specialist (dietitian) to adjust your eating plan to your individual calorie needs. Reading food labels   Check food labels for the amount of sodium per serving. Choose foods with less than 5 percent of the Daily Value of sodium. Generally, foods with less than 300 mg of sodium per serving fit into this eating plan.  To find whole grains, look for the word "whole" as the first word in the ingredient list. Shopping  Buy products labeled as "low-sodium" or "no salt added."  Buy fresh foods. Avoid canned foods and premade or frozen meals. Cooking  Avoid adding salt when cooking. Use salt-free seasonings or herbs instead of table salt or sea salt. Check with your health care provider or pharmacist before using salt substitutes.  Do not fry foods. Cook  foods using healthy methods such as baking, boiling, grilling, and broiling instead.  Cook with heart-healthy oils, such as olive, canola, soybean, or sunflower oil. Meal planning  Eat a balanced diet that includes: ? 5 or more servings of fruits and vegetables each day. At each meal, try to fill half of your plate with fruits and vegetables. ? Up to 6-8 servings of whole grains each day. ? Less than 6 oz of lean meat, poultry, or fish each day. A 3-oz serving of meat is about the same size as a deck of cards. One egg equals 1 oz. ? 2 servings of low-fat dairy each day. ? A serving of nuts, seeds, or beans 5 times each week. ? Heart-healthy fats. Healthy fats called Omega-3 fatty acids are found in foods such as flaxseeds and coldwater fish, like sardines, salmon, and mackerel.  Limit how much you eat of the following: ? Canned or prepackaged foods. ? Food that is high in trans fat, such as fried foods. ? Food that is high in saturated fat, such as fatty meat. ? Sweets, desserts, sugary drinks, and other foods with added sugar. ? Full-fat dairy products.  Do not salt foods before eating.  Try to eat at least 2 vegetarian meals each week.  Eat more home-cooked food and less restaurant, buffet, and fast food.  When eating at a restaurant, ask that your food be prepared with less salt or no salt,  if possible. What foods are recommended? The items listed may not be a complete list. Talk with your dietitian about what dietary choices are best for you. Grains Whole-grain or whole-wheat bread. Whole-grain or whole-wheat pasta. Brown rice. [place] Morrow. Bulgur. Whole-grain and low-sodium cereals. Pita bread. Low-fat, low-sodium crackers. Whole-wheat flour tortillas. Vegetables Fresh or frozen vegetables (raw, steamed, roasted, or grilled). Low-sodium or reduced-sodium tomato and vegetable juice. Low-sodium or reduced-sodium tomato sauce and tomato paste. Low-sodium or reduced-sodium canned  vegetables. Fruits All fresh, dried, or frozen fruit. Canned fruit in natural juice (without added sugar). Meat and other protein foods Skinless chicken or [place]. Ground chicken or [place]. Pork with fat trimmed off. Fish and seafood. Egg whites. Dried beans, peas, or lentils. Unsalted nuts, nut butters, and seeds. Unsalted canned beans. Lean cuts of beef with fat trimmed off. Low-sodium, lean deli meat. Dairy Low-fat (1%) or fat-free (skim) milk. Fat-free, low-fat, or reduced-fat cheeses. Nonfat, low-sodium ricotta or cottage cheese. Low-fat or nonfat yogurt. Low-fat, low-sodium cheese. Fats and oils Soft margarine without trans fats. Vegetable oil. Low-fat, reduced-fat, or light mayonnaise and salad dressings (reduced-sodium). Canola, safflower, olive, soybean, and sunflower oils. Avocado. Seasoning and other foods Herbs. Spices. Seasoning mixes without salt. Unsalted popcorn and pretzels. Fat-free sweets. What foods are not recommended? The items listed may not be a complete list. Talk with your dietitian about what dietary choices are best for you. Grains Baked goods made with fat, such as croissants, muffins, or some breads. Dry pasta or rice meal packs. Vegetables Creamed or fried vegetables. Vegetables in a cheese sauce. Regular canned vegetables (not low-sodium or reduced-sodium). Regular canned tomato sauce and paste (not low-sodium or reduced-sodium). Regular tomato and vegetable juice (not low-sodium or reduced-sodium). Angie Fava. Olives. Fruits Canned fruit in a light or heavy syrup. Fried fruit. Fruit in cream or butter sauce. Meat and other protein foods Fatty cuts of meat. Ribs. Fried meat. Berniece Salines. Sausage. Bologna and other processed lunch meats. Salami. Fatback. Hotdogs. Bratwurst. Salted nuts and seeds. Canned beans with added salt. Canned or smoked fish. Whole eggs or egg yolks. Chicken or [place] with skin. Dairy Whole or 2% milk, cream, and half-and-half. Whole or full-fat  cream cheese. Whole-fat or sweetened yogurt. Full-fat cheese. Nondairy creamers. Whipped toppings. Processed cheese and cheese spreads. Fats and oils Butter. Stick margarine. Lard. Shortening. Ghee. Bacon fat. Tropical oils, such as coconut, palm kernel, or palm oil. Seasoning and other foods Salted popcorn and pretzels. Onion salt, garlic salt, seasoned salt, table salt, and sea salt. Worcestershire sauce. Tartar sauce. Barbecue sauce. Teriyaki sauce. Soy sauce, including reduced-sodium. Steak sauce. Canned and packaged gravies. Fish sauce. Oyster sauce. Cocktail sauce. Horseradish that you find on the shelf. Ketchup. Mustard. Meat flavorings and tenderizers. Bouillon cubes. Hot sauce and Tabasco sauce. Premade or packaged marinades. Premade or packaged taco seasonings. Relishes. Regular salad dressings. Where to find more information:  National Heart, Lung, and [place]: https://wilson-eaton.com/  American Heart Association: www.heart.org Summary  The DASH eating plan is a healthy eating plan that has been shown to reduce high blood pressure (hypertension). It may also reduce your risk for type 2 diabetes, heart disease, and stroke.  With the DASH eating plan, you should limit salt (sodium) intake to 2,300 mg a day. If you have hypertension, you may need to reduce your sodium intake to 1,500 mg a day.  When on the DASH eating plan, aim to eat more fresh fruits and vegetables, whole grains, lean proteins, low-fat dairy, and heart-healthy fats.  Work with your health care provider or diet and nutrition specialist (dietitian) to adjust your eating plan to your individual calorie needs. This information is not intended to replace advice given to you by your health care provider. Make sure you discuss any questions you have with your health care provider. Document Revised: 10/10/2017 Document Reviewed: 10/21/2016 Elsevier Patient Education  [place].  Memory Compensation  Strategies  1. Use "WARM" strategy.  W= write it down  A= associate it  R= repeat it  M= make a mental note  2.   You can keep a Social worker.  Use a 3-ring notebook with sections for the following: calendar, important names and phone numbers,  medications, doctors' names/phone numbers, lists/reminders, and a section to journal what you did  each day.   3.    Use a calendar to write appointments down.  4.    Write yourself a schedule for the day.  This can be placed on the calendar or in a separate section of the Memory Notebook.  Keeping a  regular schedule can help memory.  5.    Use medication organizer with sections for each day or morning/evening pills.  You may need help loading it  6.    Keep a basket, or pegboard by the door.  Place items that you need to take out with you in the basket or on the pegboard.  You may also want to  include a message board for reminders.  7.    Use sticky notes.  Place sticky notes with reminders in a place where the task is performed.  For example: " turn off the  stove" placed by the stove, "lock the door" placed on the door at eye level, " take your medications" on  the bathroom mirror or by the place where you normally take your medications.  8.    Use alarms/timers.  Use while cooking to remind yourself to check on food or as a reminder to take your medicine, or as a  reminder to make a call, or as a reminder to perform another task, etc.

## 2020-04-27 NOTE — Progress Notes (Addendum)
Chief Complaint  Patient presents with   Follow-up   F/u  1. HTN 150/60 repeat 140/56 hctz 25, lis 10 mg qd norvasc 5 mg qd  2. Recurrent depression/anxiety on cymbalta 30 mg qd and not taking prn xanax 0.25 mg bid prn. Mood still down and still anxious at times  C/o memory loss if doing a task and gets distracted  will refer to neurology. He sees psych and therapy through the [place] [facility]  Refer neurology   3. COPD with recent exacerbation saw Dr. Peyton Bottoms 04/26/20 given prn Prednisone and zpack on stoiloto vs trelegy and prn albuterol  4. Prediabetes  5. BPH with bladder outlet obstruction and trouble emtying bladder at times on flomax 0.4 will refer Dr. Bernardo Heater   Review of Systems  Constitutional: Negative for weight loss.  HENT: Negative for hearing loss.   Eyes: Negative for blurred vision.  Respiratory: Negative for hemoptysis and shortness of breath.   Cardiovascular: Negative for chest pain.  Gastrointestinal: Negative for abdominal pain.  Genitourinary:       +trouble emptying bladder  Skin: Negative for rash.  Neurological: Negative for headaches.  Psychiatric/Behavioral: Positive for depression and memory loss. The patient is nervous/anxious.    Past Medical History:  Diagnosis Date   Anemia    Arthritis    BPH (benign prostatic hyperplasia)    Cancer (HCC)    COPD (chronic obstructive pulmonary disease) (HCC)    Emphysema of lung (HCC)    Emphysema of lung (HCC)    Esophageal reflux    Hyperlipidemia    Hypertension    Internal carotid artery stenosis, right    Neuropathy of both feet    Pneumonia    recurrent   PTSD (post-traumatic stress disorder)    [place] vet   Sebaceous cyst    SOB (shortness of breath) on exertion    TIA (transient ischemic attack)    Urge incontinence    Past Surgical History:  Procedure Laterality Date   CYST EXCISION     buttocks   ELECTROMAGNETIC NAVIGATION BROCHOSCOPY Left 08/23/2019   Procedure:  ELECTROMAGNETIC NAVIGATION BRONCHOSCOPY;  Surgeon: Tyler Pita, MD;  Location: [facility] ORS;  Service: Cardiopulmonary;  Laterality: Left;   ENDOBRONCHIAL ULTRASOUND N/A 08/23/2019   Procedure: ENDOBRONCHIAL ULTRASOUND;  Surgeon: Tyler Pita, MD;  Location: [facility] ORS;  Service: Cardiopulmonary;  Laterality: N/A;   HEMORRHOID SURGERY     SINUSOTOMY     Family History  Problem Relation Age of Onset   Heart disease Mother    Cancer Maternal Aunt        lung   Lung cancer Maternal Aunt    Leukemia Maternal Aunt    Social History   Socioeconomic History   Marital status: Married    Spouse name: Kathreen Cornfield   Number of children: 2   Years of education: Not on file   Highest education level: Not on file  Occupational History   Not on file  Tobacco Use   Smoking status: Former Smoker    Packs/day: 1.50    Years: 50.00    Pack years: 75.00    Types: Cigarettes    Quit date: 09/10/1999    Years since quitting: 20.6   Smokeless tobacco: Never Used  Vaping Use   Vaping Use: Never used  Substance and Sexual Activity   Alcohol use: No    Alcohol/week: 0.0 standard drinks    Comment: not since 12/1980   Drug use: No   Sexual  activity: Yes  Other Topics Concern   Not on file  Social History Narrative   Lives in [place] with wife. Dog and cat in home.      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  Dietitian.      Diet - regular      Exercise - Walking   Social Determinants of Health   Financial Resource Strain:    Difficulty of Paying Living Expenses:   Food Insecurity:    Worried About Charity fundraiser in the Last Year:    Arboriculturist in the Last Year:   Transportation Needs:    Film/video editor (Medical):    Lack of Transportation (Non-Medical):   Physical Activity:    Days of Exercise per Week:    Minutes of Exercise per Session:   Stress:    Feeling of Stress :   Social Connections:    Frequency of  Communication with Friends and Family:    Frequency of Social Gatherings with Friends and Family:    Attends Religious Services:    Active Member of Clubs or Organizations:    Attends Music therapist:    Marital Status:   Intimate Partner Violence:    Fear of Current or Ex-Partner:    Emotionally Abused:    Physically Abused:    Sexually Abused:    Current Meds  Medication Sig   albuterol (VENTOLIN HFA) 108 (90 Base) MCG/ACT inhaler Inhale 2 puffs into the lungs every 6 (six) hours as needed for wheezing or shortness of breath.   Alpha-Lipoic Acid 600 MG CAPS Take 1 capsule (600 mg total) by mouth in the morning and at bedtime.   amLODipine (NORVASC) 5 MG tablet Take 1 tablet (5 mg total) by mouth 2 (two) times daily. (Patient taking differently: Take 5 mg by mouth daily. )   aspirin EC 81 MG tablet Take 81 mg by mouth daily.    atorvastatin (LIPITOR) 80 MG tablet Take 40 mg by mouth daily.   cetirizine (ZYRTEC) 10 MG tablet Take 1 tablet (10 mg total) by mouth daily.   cholecalciferol (VITAMIN D) 1000 UNITS tablet Take 1,000 Units by mouth 2 (two) times daily.    DULoxetine (CYMBALTA) 30 MG capsule Take 1 capsule (30 mg total) by mouth daily.   dutasteride (AVODART) 0.5 MG capsule Take 1 capsule (0.5 mg total) by mouth daily.   famotidine (PEPCID) 20 MG tablet Take 1 tablet (20 mg total) by mouth daily as needed for heartburn or indigestion.   hydrochlorothiazide (HYDRODIURIL) 25 MG tablet Take 0.5 tablets (12.5 mg total) by mouth daily.   lisinopril (ZESTRIL) 10 MG tablet Take 1 tablet (10 mg total) by mouth daily.   Multiple Vitamins-Minerals (MEGA MULTIVITAMIN FOR MEN) TABS Take 1 tablet by mouth daily.    omeprazole (PRILOSEC) 40 MG capsule Take 1 capsule (40 mg total) by mouth daily.   tamsulosin (FLOMAX) 0.4 MG CAPS capsule Take 1 capsule (0.4 mg total) by mouth daily.   Tiotropium Bromide-Olodaterol 2.5-2.5 MCG/ACT AERS Inhale 2 puffs into  the lungs daily.   valACYclovir (VALTREX) 500 MG tablet Take 1 tablet (500 mg total) by mouth every other day.   [DISCONTINUED] ALPRAZolam (XANAX) 0.25 MG tablet Take 1 tablet (0.25 mg total) by mouth 2 (two) times daily as needed for anxiety.   [DISCONTINUED] DULoxetine (CYMBALTA) 20 MG capsule Take 1 capsule (20 mg total) by mouth daily.   [DISCONTINUED] lisinopril (ZESTRIL) 10 MG tablet Take 0.5  tablets (5 mg total) by mouth daily.   Allergies  Allergen Reactions   Cephalexin Rash   Recent Results (from the past 2160 hour(s))  CBC with Differential/Platelet     Status: Abnormal   Collection Time: 02/28/20  9:00 AM  Result Value Ref Range   WBC 9.6 4.0 - 10.5 K/uL   RBC 3.59 (L) 4.22 - 5.81 MIL/uL   Hemoglobin 10.0 (L) 13.0 - 17.0 g/dL   HCT 32.3 (L) 39 - 52 %   MCV 90.0 80.0 - 100.0 fL   MCH 27.9 26.0 - 34.0 pg   MCHC 31.0 30.0 - 36.0 g/dL   RDW 14.5 11.5 - 15.5 %   Platelets 184 150 - 400 K/uL   nRBC 0.0 0.0 - 0.2 %   Neutrophils Relative % 44 %   Neutro Abs 4.2 1.7 - 7.7 K/uL   Lymphocytes Relative 39 %   Lymphs Abs 3.8 0.7 - 4.0 K/uL   Monocytes Relative 8 %   Monocytes Absolute 0.8 0 - 1 K/uL   Eosinophils Relative 7 %   Eosinophils Absolute 0.7 (H) 0 - 0 K/uL   Basophils Relative 1 %   Basophils Absolute 0.1 0 - 0 K/uL   Immature Granulocytes 1 %   Abs Immature Granulocytes 0.05 0.00 - 0.07 K/uL    Comment: Performed at [facility], [address] [place], [place] [address]  Basic metabolic panel     Status: Abnormal   Collection Time: 02/28/20  9:00 AM  Result Value Ref Range   Sodium 141 135 - 145 mmol/L   Potassium 4.7 3.5 - 5.1 mmol/L   Chloride 110 98 - 111 mmol/L   CO2 23 22 - 32 mmol/L   Glucose, Bld 159 (H) 70 - 99 mg/dL    Comment: Glucose reference range applies only to samples taken after fasting for at least 8 hours.   BUN 24 (H) 8 - 23 mg/dL   Creatinine, Ser 1.54 (H) 0.61 - 1.24 mg/dL   Calcium 9.1 8.9 - 10.3 mg/dL   GFR calc non Af  Amer 41 (L) >60 mL/min   GFR calc Af Amer 47 (L) >60 mL/min   Anion gap 8 5 - 15    Comment: Performed at [facility], [address] [place], [place] [address]  Uric acid     Status: None   Collection Time: 03/06/20  9:16 AM  Result Value Ref Range   Uric Acid, Serum 5.2 4.0 - 7.8 mg/dL  Hemoglobin A1c     Status: None   Collection Time: 03/06/20  9:16 AM  Result Value Ref Range   Hgb A1c MFr Bld 6.4 4.6 - 6.5 %    Comment: Glycemic Control Guidelines for People with Diabetes:Non Diabetic:  <6%Goal of Therapy: <7%Additional Action Suggested:  >8%   TSH     Status: None   Collection Time: 03/06/20  9:16 AM  Result Value Ref Range   TSH 4.27 0.35 - 4.50 uIU/mL  CBC with Differential/Platelet     Status: Abnormal   Collection Time: 03/06/20  9:16 AM  Result Value Ref Range   WBC 9.2 4.0 - 10.5 K/uL   RBC 3.61 (L) 4.22 - 5.81 Mil/uL   Hemoglobin 10.3 (L) 13.0 - 17.0 g/dL   HCT 31.2 (L) 39 - 52 %   MCV 86.5 78.0 - 100.0 fl   MCHC 33.1 30.0 - 36.0 g/dL   RDW 15.5 11.5 - 15.5 %   Platelets 214.0  150 - 400 K/uL   Neutrophils Relative % 43.7 43 - 77 %   Lymphocytes Relative 43.4 12 - 46 %   Monocytes Relative 5.9 3 - 12 %   Eosinophils Relative 6.2 (H) 0 - 5 %   Basophils Relative 0.8 0 - 3 %   Neutro Abs 4.0 1.4 - 7.7 K/uL   Lymphs Abs 4.0 0.7 - 4.0 K/uL   Monocytes Absolute 0.5 0 - 1 K/uL   Eosinophils Absolute 0.6 0 - 0 K/uL   Basophils Absolute 0.1 0 - 0 K/uL  Lipid panel     Status: Abnormal   Collection Time: 03/06/20  9:16 AM  Result Value Ref Range   Cholesterol 192 0 - 200 mg/dL    Comment: ATP III Classification       Desirable:  < 200 mg/dL               Borderline High:  200 - 239 mg/dL          High:  > = 240 mg/dL   Triglycerides 172.0 (H) 0 - 149 mg/dL    Comment: Normal:  <150 mg/dLBorderline High:  150 - 199 mg/dL   HDL 52.30 >39.00 mg/dL   VLDL 34.4 0.0 - 40.0 mg/dL   LDL Cholesterol 105 (H) 0 - 99 mg/dL   Total CHOL/HDL Ratio 4     Comment:                 Men          Women1/2 Average Risk     3.4          3.3Average Risk          5.0          4.42X Average Risk          9.6          7.13X Average Risk          15.0          11.0                       NonHDL 139.86     Comment: NOTE:  Non-HDL goal should be 30 mg/dL higher than patient's LDL goal (i.e. LDL goal of < 70 mg/dL, would have non-HDL goal of < 100 mg/dL)  Comprehensive metabolic panel     Status: Abnormal   Collection Time: 03/06/20  9:16 AM  Result Value Ref Range   Sodium 139 135 - 145 mEq/L   Potassium 4.5 3.5 - 5.1 mEq/L   Chloride 106 96 - 112 mEq/L   CO2 25 19 - 32 mEq/L   Glucose, Bld 113 (H) 70 - 99 mg/dL   BUN 22 6 - 23 mg/dL   Creatinine, Ser 1.43 0.40 - 1.50 mg/dL   Total Bilirubin 0.3 0.2 - 1.2 mg/dL   Alkaline Phosphatase 80 39 - 117 U/L   AST 18 0 - 37 U/L   ALT 15 0 - 53 U/L   Total Protein 6.5 6.0 - 8.3 g/dL   Albumin 4.3 3.5 - 5.2 g/dL   GFR 46.97 (L) >60.00 mL/min   Calcium 9.2 8.4 - 10.5 mg/dL   Objective  Body mass index is 34.33 kg/m. Wt Readings from Last 3 Encounters:  04/27/20 225 lb 12.8 oz (102.4 kg)  03/02/20 229 lb 4.8 oz (104 kg)  02/22/20 227 lb 6.4 oz (103.1 kg)   Temp Readings from Last 3 Encounters:  04/27/20 98.1 F (36.7 C) (Temporal)  03/02/20 (!) 96.9 F (36.1 C) (Tympanic)  02/22/20 97.9 F (36.6 C) (Skin)   BP Readings from Last 3 Encounters:  04/27/20 (!) 140/56  03/02/20 (!) 157/49  02/22/20 136/62   Pulse Readings from Last 3 Encounters:  04/27/20 (!) 57  03/02/20 (!) 56  02/22/20 82    Physical Exam Vitals and nursing note reviewed.  Constitutional:      Appearance: Normal appearance. He is well-developed and well-groomed. He is obese.  HENT:     Head: Normocephalic and atraumatic.  Eyes:     Conjunctiva/sclera: Conjunctivae normal.     Pupils: Pupils are equal, round, and reactive to light.  Cardiovascular:     Rate and Rhythm: Normal rate and regular rhythm.     Heart sounds: Normal heart sounds.  No murmur heard.   Pulmonary:     Effort: Pulmonary effort is normal.     Breath sounds: Normal breath sounds. No wheezing.  Skin:    General: Skin is warm and dry.     Findings: Bruising present.     Comments: Arms bruising  Neurological:     General: No focal deficit present.     Mental Status: He is alert and oriented to person, place, and time. Mental status is at baseline.     Gait: Gait normal.  Psychiatric:        Attention and Perception: Attention and perception normal.        Mood and Affect: Mood and affect normal.        Speech: Speech normal.        Behavior: Behavior normal. Behavior is cooperative.        Thought Content: Thought content normal.        Cognition and Memory: Cognition and memory normal.        Judgment: Judgment normal.     Assessment  Plan  Essential hypertension - Plan: lisinopril (ZESTRIL) 10 MG tablet, hctz 25 norvasc 5  Check at f/u  Prediabetes Healthy diet and exercise A1C 02/2020 6.4   Memory loss - Plan: Ambulatory referral to Neurology Dr. Manuella Ghazi   Benign prostatic hyperplasia with incomplete bladder emptying +LUTS - Plan: Ambulatory referral to Urology Dr. Bernardo Heater  Bladder outlet obstruction - Plan: Ambulatory referral to Urology  Lung nodules Pulmonary emphysema, unspecified emphysema type ([place]) Cont meds f/u pulm  Anxiety and depression - Plan: DULoxetine (CYMBALTA) 30 MG capsule Off xanax 0.25 bid prn  [facility]    HM Flu shot due 2020  prevnar utd had [facility] 08/02/14 pna 23 utd per [facility] had 08/2001  Tdap had 09/10/11 or 10/15/11  covid per pt had but not in ncir had 2/2  -per [facility] had 08/30/14 zostavax  singrix had 12/02/17 and 02/09/18   Colonoscopy 2011 normal per pt consider cologuard until age 38 in future will be 87 11/25/18  PSA last 2.16 10/09/17 out of age window  Smoking history assess at f/u  Skin Dr. Kellie Moor referral today   C/o CLL/SLL f/u with H/o  A1C 6.9 02/24/2019 goal for age <8.0 will  monitor  Dr. Zannie Kehr  Derm [place] derm [address] seen 03/13/20 shave bx left neck VV +AK neg [facility]   [facility]ablished with [facility] Provider: Dr. Olivia Mackie McLean-Scoc[facility]

## 2020-04-28 ENCOUNTER — Encounter: Payer: Self-pay | Admitting: Urology

## 2020-04-28 ENCOUNTER — Ambulatory Visit (INDEPENDENT_AMBULATORY_CARE_PROVIDER_SITE_OTHER): Payer: Medicare HMO | Admitting: Urology

## 2020-04-28 VITALS — BP 93/58 | HR 59 | Ht 68.0 in | Wt 225.0 lb

## 2020-04-28 DIAGNOSIS — N401 Enlarged prostate with lower urinary tract symptoms: Secondary | ICD-10-CM

## 2020-04-28 DIAGNOSIS — N138 Other obstructive and reflux uropathy: Secondary | ICD-10-CM | POA: Diagnosis not present

## 2020-04-28 LAB — BLADDER SCAN AMB NON-IMAGING: Scan Result: 176

## 2020-04-28 NOTE — Progress Notes (Signed)
04/28/2020 10:56 AM   Donald [name] 05-08-1933 [id number]  Referring provider: McLean-Scocuzza, Nino Glow, MD [place],  [place] [address]  Chief Complaint  Patient presents with  . Benign Prostatic Hypertrophy    HPI: 86 yo male presents for follow-up of BPH.  -Last seen 08/2019 -Remains on tamsulosin and finasteride -Most bothersome symptom is urge with urge incontinence -Denies dysuria, gross hematuria -Denies flank, abdominal, pelvic pain -IPSS completed today 27/35     PMH: Past Medical History:  Diagnosis Date  . Anemia   . Arthritis   . BPH (benign prostatic hyperplasia)   . Cancer ([place])   . COPD (chronic obstructive pulmonary disease) ([place])   . Emphysema of lung ([place])   . Emphysema of lung ([place])   . Esophageal reflux   . Hyperlipidemia   . Hypertension   . Internal carotid artery stenosis, right   . Neuropathy of both feet   . Pneumonia    recurrent  . PTSD (post-traumatic stress disorder)    [place] vet  . Sebaceous cyst   . SOB (shortness of breath) on exertion   . TIA (transient ischemic attack)   . Urge incontinence     Surgical History: Past Surgical History:  Procedure Laterality Date  . CYST EXCISION     buttocks  . ELECTROMAGNETIC NAVIGATION BROCHOSCOPY Left 08/23/2019   Procedure: ELECTROMAGNETIC NAVIGATION BRONCHOSCOPY;  Surgeon: Tyler Pita, MD;  Location: [facility] ORS;  Service: Cardiopulmonary;  Laterality: Left;  . ENDOBRONCHIAL ULTRASOUND N/A 08/23/2019   Procedure: ENDOBRONCHIAL ULTRASOUND;  Surgeon: Tyler Pita, MD;  Location: [facility] ORS;  Service: Cardiopulmonary;  Laterality: N/A;  . HEMORRHOID SURGERY    . shave biopsy  03/13/2020   left neck 03/13/20 [facility] wart with atypica inflamed +AK no carcinoma   . SINUSOTOMY      Home Medications:  Allergies as of 04/28/2020      Reactions   Cephalexin Rash      Medication List       Accurate as of April 28, 2020 10:56 AM. If you  have any questions, ask your nurse or doctor.        albuterol 108 (90 Base) MCG/ACT inhaler Commonly known as: VENTOLIN HFA Inhale 2 puffs into the lungs every 6 (six) hours as needed for wheezing or shortness of breath.   Alpha-Lipoic Acid 600 MG Caps Take 1 capsule (600 mg total) by mouth in the morning and at bedtime.   amLODipine 5 MG tablet Commonly known as: NORVASC Take 1 tablet (5 mg total) by mouth 2 (two) times daily. What changed: when to take this   aspirin EC 81 MG tablet Take 81 mg by mouth daily.   atorvastatin 80 MG tablet Commonly known as: LIPITOR Take 40 mg by mouth daily.   cetirizine 10 MG tablet Commonly known as: ZYRTEC Take 1 tablet (10 mg total) by mouth daily.   cholecalciferol 1000 units tablet Commonly known as: VITAMIN D Take 1,000 Units by mouth 2 (two) times daily.   DULoxetine 30 MG capsule Commonly known as: Cymbalta Take 1 capsule (30 mg total) by mouth daily.   dutasteride 0.5 MG capsule Commonly known as: AVODART Take 1 capsule (0.5 mg total) by mouth daily.   famotidine 20 MG tablet Commonly known as: PEPCID Take 1 tablet (20 mg total) by mouth daily as needed for heartburn or indigestion.   gabapentin 300 MG capsule Commonly known as: NEURONTIN Take 1 capsule (300 mg total)  by mouth at bedtime.   hydrochlorothiazide 25 MG tablet Commonly known as: HYDRODIURIL Take 0.5 tablets (12.5 mg total) by mouth daily.   lisinopril 10 MG tablet Commonly known as: ZESTRIL Take 1 tablet (10 mg total) by mouth daily.   Mega Multivitamin for Men Tabs Take 1 tablet by mouth daily.   omeprazole 40 MG capsule Commonly known as: PRILOSEC Take 1 capsule (40 mg total) by mouth daily.   tamsulosin 0.4 MG Caps capsule Commonly known as: FLOMAX Take 1 capsule (0.4 mg total) by mouth daily.   Tiotropium Bromide-Olodaterol 2.5-2.5 MCG/ACT Aers Inhale 2 puffs into the lungs daily.   valACYclovir 500 MG tablet Commonly known as:  VALTREX Take 1 tablet (500 mg total) by mouth every other day.       Allergies:  Allergies  Allergen Reactions  . Cephalexin Rash    Family History: Family History  Problem Relation Age of Onset  . Heart disease Mother   . Cancer Maternal Aunt        lung  . Lung cancer Maternal Aunt   . Leukemia Maternal Aunt     Social History:  reports that he quit smoking about 20 years ago. His smoking use included cigarettes. He has a 75.00 pack-year smoking history. He has never used smokeless tobacco. He reports that he does not drink alcohol and does not use drugs.   Physical Exam: BP (!) 93/58   Pulse (!) 59   Ht 5\' 8"  (1.727 m)   Wt 225 lb (102.1 kg)   BMI 34.21 kg/m   Constitutional:  Alert and oriented, No acute distress. HEENT: [place] AT, moist mucus membranes.  Trachea midline, no masses. Cardiovascular: No clubbing, cyanosis, or edema. Respiratory: Normal respiratory effort, no increased work of breathing. Skin: No rashes, bruises or suspicious lesions. Neurologic: Grossly intact, no focal deficits, moving all 4 extremities. Psychiatric: Normal mood and affect.   Assessment & Plan:    1. Benign prostatic hyperplasia with urinary obstruction -Unable to void, estimated bladder volume by scan 167 mL -Severe LUTS on combination therapy -Most bothersome symptoms are storage related -Options discussed  Continuing present therapy  Adding Myrbetriq  Surgical options including UroLift, TURP/PVP, HoLEP  Discussed need for cystoscopy/TRUS if interested in surgical options  -He would like to continue medical management for now and was given samples of Myrbetriq 25 mg.  If not effective he may be interested in UroLift.   -Follow-up 4 weeks for reassessment   Abbie Sons, MD  [facility] [address], [place], [place] [address] [phone]

## 2020-05-01 ENCOUNTER — Encounter: Payer: Self-pay | Admitting: Urology

## 2020-05-12 ENCOUNTER — Telehealth: Payer: Self-pay | Admitting: Internal Medicine

## 2020-05-12 NOTE — Telephone Encounter (Signed)
Pt has not heard on neurology referral Dr Manuella Ghazi memory loss Thanks [facility]

## 2020-05-24 ENCOUNTER — Other Ambulatory Visit: Payer: Self-pay

## 2020-05-24 ENCOUNTER — Ambulatory Visit (INDEPENDENT_AMBULATORY_CARE_PROVIDER_SITE_OTHER): Payer: Medicare HMO

## 2020-05-24 ENCOUNTER — Ambulatory Visit
Admission: RE | Admit: 2020-05-24 | Discharge: 2020-05-24 | Disposition: A | Discharge status: Home / Self Care | Payer: Medicare HMO | Source: Ambulatory Visit | Attending: Oncology | Admitting: Oncology

## 2020-05-24 VITALS — Ht 68.0 in | Wt 225.0 lb

## 2020-05-24 DIAGNOSIS — I898 Other specified noninfective disorders of lymphatic vessels and lymph nodes: Secondary | ICD-10-CM | POA: Diagnosis not present

## 2020-05-24 DIAGNOSIS — Z0000 Encounter for general adult medical examination without abnormal findings: Secondary | ICD-10-CM | POA: Diagnosis not present

## 2020-05-24 DIAGNOSIS — R911 Solitary pulmonary nodule: Secondary | ICD-10-CM | POA: Diagnosis not present

## 2020-05-24 DIAGNOSIS — I700 Atherosclerosis of aorta: Secondary | ICD-10-CM | POA: Diagnosis not present

## 2020-05-24 DIAGNOSIS — I2510 Atherosclerotic heart disease of native coronary artery without angina pectoris: Secondary | ICD-10-CM | POA: Diagnosis not present

## 2020-05-24 DIAGNOSIS — J984 Other disorders of lung: Secondary | ICD-10-CM | POA: Diagnosis not present

## 2020-05-24 LAB — POCT I-STAT CREATININE: Creatinine, Ser: 1.5 mg/dL — ABNORMAL HIGH (ref 0.61–1.24)

## 2020-05-24 MED ORDER — IOHEXOL 300 MG/ML  SOLN
60.0000 mL | Freq: Once | INTRAMUSCULAR | Status: AC | PRN
  Administered 2020-05-24: 60 mL via INTRAVENOUS

## 2020-05-24 NOTE — Progress Notes (Signed)
Subjective:   Donald [name] is a 86 y.o. male who presents for Medicare Annual/Subsequent preventive examination.  Review of Systems    No ROS.  Medicare Wellness Virtual Visit.    Cardiac Risk Factors include: advanced age (>39men, >63 women);hypertension;diabetes mellitus;male gender     Objective:    Today's Vitals   05/24/20 1133  Weight: 225 lb (102.1 kg)  Height: 5\' 8"  (1.727 m)   Body mass index is 34.21 kg/m.  Advanced Directives 05/24/2020 03/02/2020 08/23/2019 08/18/2019 08/09/2019 07/30/2019 02/17/2019  Does Patient Have a Medical Advance Directive? Yes No Yes Yes Yes Yes No  Type of [employer]maker [place];Living will [place];Living will [place];Living will [place];Living will -  Does patient want to make changes to medical advance directive? No - Patient declined - No - Patient declined - No - Patient declined - -  Copy of [place] in Chart? No - copy requested - No - copy requested No - copy requested No - copy requested - -  Would patient like information on creating a medical advance directive? - No - Patient declined - - - - -    Current Medications (verified) Outpatient Encounter Medications as of 05/24/2020  Medication Sig  . albuterol (VENTOLIN HFA) 108 (90 Base) MCG/ACT inhaler Inhale 2 puffs into the lungs every 6 (six) hours as needed for wheezing or shortness of breath.  . Alpha-Lipoic Acid 600 MG CAPS Take 1 capsule (600 mg total) by mouth in the morning and at bedtime.  Marland Kitchen amLODipine (NORVASC) 5 MG tablet Take 1 tablet (5 mg total) by mouth 2 (two) times daily. (Patient taking differently: Take 5 mg by mouth daily. )  . aspirin EC 81 MG tablet Take 81 mg by mouth daily.   Marland Kitchen atorvastatin (LIPITOR) 80 MG tablet Take 40 mg by mouth daily.  . cetirizine (ZYRTEC) 10 MG tablet Take 1 tablet (10 mg total) by mouth daily.  .  cholecalciferol (VITAMIN D) 1000 UNITS tablet Take 1,000 Units by mouth 2 (two) times daily.   . DULoxetine (CYMBALTA) 30 MG capsule Take 1 capsule (30 mg total) by mouth daily.  Marland Kitchen dutasteride (AVODART) 0.5 MG capsule Take 1 capsule (0.5 mg total) by mouth daily.  . famotidine (PEPCID) 20 MG tablet Take 1 tablet (20 mg total) by mouth daily as needed for heartburn or indigestion.  . gabapentin (NEURONTIN) 300 MG capsule Take 1 capsule (300 mg total) by mouth at bedtime.  . hydrochlorothiazide (HYDRODIURIL) 25 MG tablet Take 0.5 tablets (12.5 mg total) by mouth daily.  Marland Kitchen lisinopril (ZESTRIL) 10 MG tablet Take 1 tablet (10 mg total) by mouth daily.  . Multiple Vitamins-Minerals (MEGA MULTIVITAMIN FOR MEN) TABS Take 1 tablet by mouth daily.   Marland Kitchen omeprazole (PRILOSEC) 40 MG capsule Take 1 capsule (40 mg total) by mouth daily.  . tamsulosin (FLOMAX) 0.4 MG CAPS capsule Take 1 capsule (0.4 mg total) by mouth daily.  . Tiotropium Bromide-Olodaterol 2.5-2.5 MCG/ACT AERS Inhale 2 puffs into the lungs daily.  . valACYclovir (VALTREX) 500 MG tablet Take 1 tablet (500 mg total) by mouth every other day.   No facility-administered encounter medications on file as of 05/24/2020.    Allergies (verified) Cephalexin   History: Past Medical History:  Diagnosis Date  . Anemia   . Arthritis   . BPH (benign prostatic hyperplasia)   . Cancer ([place])   .  COPD (chronic obstructive pulmonary disease) ([place])   . Emphysema of lung ([place])   . Emphysema of lung ([place])   . Esophageal reflux   . Hyperlipidemia   . Hypertension   . Internal carotid artery stenosis, right   . Neuropathy of both feet   . Pneumonia    recurrent  . PTSD (post-traumatic stress disorder)    [place] vet  . Sebaceous cyst   . SOB (shortness of breath) on exertion   . TIA (transient ischemic attack)   . Urge incontinence    Past Surgical History:  Procedure Laterality Date  . CYST EXCISION     buttocks  . ELECTROMAGNETIC NAVIGATION  BROCHOSCOPY Left 08/23/2019   Procedure: ELECTROMAGNETIC NAVIGATION BRONCHOSCOPY;  Surgeon: Tyler Pita, MD;  Location: [facility] ORS;  Service: Cardiopulmonary;  Laterality: Left;  . ENDOBRONCHIAL ULTRASOUND N/A 08/23/2019   Procedure: ENDOBRONCHIAL ULTRASOUND;  Surgeon: Tyler Pita, MD;  Location: [facility] ORS;  Service: Cardiopulmonary;  Laterality: N/A;  . HEMORRHOID SURGERY    . shave biopsy  03/13/2020   left neck 03/13/20 [facility] Barnetta [facility] wart with atypica inflamed +AK no carcinoma   . SINUSOTOMY     Family History  Problem Relation Age of Onset  . Heart disease Mother   . Cancer Maternal Aunt        lung  . Lung cancer Maternal Aunt   . Leukemia Maternal Aunt   . Hypertension Son    Social History   Socioeconomic History  . Marital status: Married    Spouse name: Kathreen Cornfield  . Number of children: 2  . Years of education: Not on file  . Highest education level: Not on file  Occupational History  . Not on file  Tobacco Use  . Smoking status: Former Smoker    Packs/day: 1.50    Years: 50.00    Pack years: 75.00    Types: Cigarettes    Quit date: 09/10/1999    Years since quitting: 20.7  . Smokeless tobacco: Never Used  Vaping Use  . Vaping Use: Never used  Substance and Sexual Activity  . Alcohol use: No    Alcohol/week: 0.0 standard drinks    Comment: not since 12/1980  . Drug use: No  . Sexual activity: Yes  Other Topics Concern  . Not on file  Social History Narrative   Lives in [place] with wife. Dog and cat in home.      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  Dietitian.      Diet - regular      Exercise - Walking   Social Determinants of Health   Financial Resource Strain:   . Difficulty of Paying Living Expenses:   Food Insecurity:   . Worried About Charity fundraiser in the Last Year:   . Arboriculturist in the Last Year:   Transportation Needs: No Transportation Needs  . Lack of Transportation (Medical): No  . Lack of  Transportation (Non-Medical): No  Physical Activity: Insufficiently Active  . Days of Exercise per Week: 5 days  . Minutes of Exercise per Session: 20 min  Stress: No Stress Concern Present  . Feeling of Stress : Not at all  Social Connections: Unknown  . Frequency of Communication with Friends and Family: Not on file  . Frequency of Social Gatherings with Friends and Family: Not on file  . Attends Religious Services: Not on file  . Active Member of Clubs or Organizations: Not on  file  . Attends Archivist Meetings: Not on file  . Marital Status: Married    Tobacco Counseling Counseling given: Not Answered   Clinical Intake:  Pre-visit preparation completed: Yes        Diabetes: Yes (Followed by pcp and [facility], [place])  How often do you need to have someone help you when you read instructions, pamphlets, or other written materials from your doctor or pharmacy?: 1 - Never  Interpreter Needed?: No      Activities of Daily Living In your present state of health, do you have any difficulty performing the following activities: 05/24/2020 08/18/2019  Hearing? Y N  Comment Hearing aids -  Vision? N N  Difficulty concentrating or making decisions? N N  Walking or climbing stairs? Y Y  Comment - shortness of breath with exertion  Dressing or bathing? N N  Doing errands, shopping? N N  Preparing Food and eating ? N -  Using the Toilet? N -  In the past six months, have you accidently leaked urine? Y -  Comment Managed with daily depend -  Do you have problems with loss of bowel control? N -  Managing your Medications? N -  Managing your Finances? N -  Housekeeping or managing your Housekeeping? N -  Some recent data might be hidden    Patient Care Team: McLean-Scocuzza, Nino Glow, MD as PCP - General (Internal Medicine) Minna Merritts, MD as Consulting Physician (Cardiology) Telford Nab, RN as Registered Nurse Grayland Ormond, Kathlene November, MD as  Consulting Physician (Oncology)  Indicate any recent Medical Services you may have received from other than [facility] providers in the past year (date may be approximate).     Assessment:   This is a routine wellness examination for Manpreet.  I connected with Karey today by telephone and verified that I am speaking with the correct person using two identifiers. Location patient: home Location provider: work Persons participating in the virtual visit: patient, Marine scientist.    I discussed the limitations, risks, security and privacy concerns of performing an evaluation and management service by telephone and the availability of in person appointments. The patient expressed understanding and verbally consented to this telephonic visit.    Interactive audio and video telecommunications were attempted between this provider and patient, however failed, due to patient having technical difficulties OR patient did not have access to video capability.  We continued and completed visit with audio only.  Some vital signs may be absent or patient reported.   Hearing/Vision screen  Hearing Screening   125Hz  250Hz  500Hz  1000Hz  2000Hz  3000Hz  4000Hz  6000Hz  8000Hz   Right ear:           Left ear:           Comments: Hearing aids  Vision Screening Comments: Followed by [facility] Wears corrective lenses Visual acuity not assessed, virtual visit.  They have seen their ophthalmologist in the last 12 months.    Dietary issues and exercise activities discussed: Current Exercise Habits: Home exercise routine, Type of exercise: walking, Time (Minutes): 20, Frequency (Times/Week): 5, Weekly Exercise (Minutes/Week): 100, Intensity: Moderate  Goals      Patient Stated   .  I want to drink more water (pt-stated)      Depression Screen PHQ 2/9 Scores 05/24/2020 04/27/2020 03/30/2019 10/21/2018 09/22/2018 03/12/2018 10/08/2017  PHQ - 2 Score 0 0 0 0 0 0 0  PHQ- 9 Score 0 0 0 - - - 0  Fall Risk Fall Risk   05/24/2020 04/27/2020 02/22/2020 05/21/2019 05/21/2019  Falls in the past year? 0 0 1 0 0  Comment - - - - -  Number falls in past yr: 0 0 1 - -  Injury with Fall? 0 0 1 - -  Risk for fall due to : - - History of fall(s) - -  Follow up Falls evaluation completed Falls evaluation completed Falls evaluation completed - -   Handrails in use when climbing stairs? Yes  Home free of loose throw rugs in walkways, pet beds, electrical cords, etc? Yes  Adequate lighting in your home to reduce risk of falls? Yes   ASSISTIVE DEVICES UTILIZED TO PREVENT FALLS: Life alert? Yes  Use of a cane, walker or w/c? No  Grab bars in the bathroom? Yes  Shower chair or bench in shower? Yes  Elevated toilet seat or a handicapped toilet? Yes   TIMED UP AND GO:  Was the test performed? No .   Cognitive Function: MMSE - Mini Mental State Exam 10/08/2017 10/02/2015  Orientation to time 5 5  Orientation to Place 5 5  Registration 3 3  Attention/ Calculation 5 5  Recall 3 3  Language- name 2 objects 2 2  Language- repeat 1 1  Language- follow 3 step command 3 3  Language- read & follow direction 1 1  Write a sentence 1 1  Copy design 1 1  Total score 30 30     6CIT Screen 05/24/2020 10/21/2018 10/08/2016  What Year? 0 points 0 points 0 points  What month? 0 points 0 points 0 points  What time? - 0 points 0 points  Count back from 20 0 points 0 points 0 points  Months in reverse 2 points 0 points 0 points  Repeat phrase 0 points 0 points 0 points  Total Score - 0 0    Immunizations Immunization History  Administered Date(s) Administered  . Influenza Split 08/05/2011, 08/19/2012, 07/29/2013  . Influenza, High Dose Seasonal PF 08/05/2016, 08/06/2017, 07/12/2018, 07/07/2019  . Influenza,inj,Quad PF,6+ Mos 07/20/2014  . Influenza-Unspecified 08/11/2015, 07/23/2017, 07/21/2019  . PFIZER SARS-COV-2 Vaccination 11/29/2019, 12/20/2019  . Pneumococcal Conjugate-13 03/18/2014, 08/02/2014  . Pneumococcal  Polysaccharide-23 08/11/2001, 03/18/2008  . Tdap 09/10/2011, 10/15/2011  . Zoster 08/30/2014  . Zoster Recombinat (Shingrix) 12/02/2017, 02/09/2018    Health Maintenance Health Maintenance  Topic Date Due  . OPHTHALMOLOGY EXAM  06/09/2020  . INFLUENZA VACCINE  06/11/2020  . HEMOGLOBIN A1C  09/05/2020  . FOOT EXAM  02/21/2021  . TETANUS/TDAP  10/14/2021  . COVID-19 Vaccine  Completed  . PNA vac Low Risk Adult  Completed   Dental Screening: Recommended annual dental exams for proper oral hygiene.  Community Resource Referral / Chronic Care Management: CRR required this visit?  No   CCM required this visit?  No      Plan:   Keep all routine maintenance appointments.   Follow up 10/31/20 @ 8:30  I have personally reviewed and noted the following in the patient's chart:   . Medical and social history . Use of alcohol, tobacco or illicit drugs  . Current medications and supplements . Functional ability and status . Nutritional status . Physical activity . Advanced directives . List of other physicians . Hospitalizations, surgeries, and ER visits in previous 12 months . Vitals . Screenings to include cognitive, depression, and falls . Referrals and appointments  In addition, I have reviewed and discussed with patient certain preventive protocols, quality metrics, and  best practice recommendations. A written personalized care plan for preventive services as well as general preventive health recommendations were provided to patient via mychart.     Varney Biles, LPN   01/30/2247

## 2020-05-24 NOTE — Patient Instructions (Addendum)
Donald [name] , Thank you for taking time to come for your Medicare Wellness Visit. I appreciate your ongoing commitment to your health goals. Please review the following plan we discussed and let me know if I can assist you in the future.   These are the goals we discussed: Goals      Patient Stated   .  I want to drink more water (pt-stated)    .  Increase physical activity (pt-stated)      Walk for exercise as tolerated       This is a list of the screening recommended for you and due dates:  Health Maintenance  Topic Date Due  . Eye exam for diabetics  06/09/2020  . Flu Shot  06/11/2020  . Hemoglobin A1C  09/05/2020  . Complete foot exam   02/21/2021  . Tetanus Vaccine  10/14/2021  . COVID-19 Vaccine  Completed  . Pneumonia vaccines  Completed    Immunizations Immunization History  Administered Date(s) Administered  . Influenza Split 08/05/2011, 08/19/2012, 07/29/2013  . Influenza, High Dose Seasonal PF 08/05/2016, 08/06/2017, 07/12/2018, 07/07/2019  . Influenza,inj,Quad PF,6+ Mos 07/20/2014  . Influenza-Unspecified 08/11/2015, 07/23/2017, 07/21/2019  . Pneumococcal Conjugate-13 03/18/2014, 08/02/2014  . Pneumococcal Polysaccharide-23 08/11/2001, 03/18/2008  . Tdap 09/10/2011, 10/15/2011  . Zoster 08/30/2014  . Zoster Recombinat (Shingrix) 12/02/2017, 02/09/2018   Keep all routine maintenance appointments.   Follow up 10/31/20 @ 8:30  Conditions/risks identified: none new  Follow up in one year for your annual wellness visit.   Preventive Care 17 Years and Older, Male Preventive care refers to lifestyle choices and visits with your health care provider that can promote health and wellness. What does preventive care include?  A yearly physical exam. This is also called an annual well check.  Dental exams once or twice a year.  Routine eye exams. Ask your health care provider how often you should have your eyes checked.  Personal lifestyle choices,  including:  Daily care of your teeth and gums.  Regular physical activity.  Eating a healthy diet.  Avoiding tobacco and drug use.  Limiting alcohol use.  Practicing safe sex.  Taking low doses of aspirin every day.  Taking vitamin and mineral supplements as recommended by your health care provider. What happens during an annual well check? The services and screenings done by your health care provider during your annual well check will depend on your age, overall health, lifestyle risk factors, and family history of disease. Counseling  Your health care provider may ask you questions about your:  Alcohol use.  Tobacco use.  Drug use.  Emotional well-being.  Home and relationship well-being.  Sexual activity.  Eating habits.  History of falls.  Memory and ability to understand (cognition).  Work and work Statistician. Screening  You may have the following tests or measurements:  Height, weight, and BMI.  Blood pressure.  Lipid and cholesterol levels. These may be checked every 5 years, or more frequently if you are over 55 years old.  Skin check.  Lung cancer screening. You may have this screening every year starting at age 86 if you have a 30-pack-year history of smoking and currently smoke or have quit within the past 15 years.  Fecal occult blood test (FOBT) of the stool. You may have this test every year starting at age 16.  Flexible sigmoidoscopy or colonoscopy. You may have a sigmoidoscopy every 5 years or a colonoscopy every 10 years starting at age 59.  Prostate cancer screening.  Recommendations will vary depending on your family history and other risks.  Hepatitis C blood test.  Hepatitis B blood test.  Sexually transmitted disease (STD) testing.  Diabetes screening. This is done by checking your blood sugar (glucose) after you have not eaten for a while (fasting). You may have this done every 1-3 years.  Abdominal aortic aneurysm (AAA)  screening. You may need this if you are a current or former smoker.  Osteoporosis. You may be screened starting at age 37 if you are at high risk. Talk with your health care provider about your test results, treatment options, and if necessary, the need for more tests. Vaccines  Your health care provider may recommend certain vaccines, such as:  Influenza vaccine. This is recommended every year.  Tetanus, diphtheria, and acellular pertussis (Tdap, Td) vaccine. You may need a Td booster every 10 years.  Zoster vaccine. You may need this after age 71.  Pneumococcal 13-valent conjugate (PCV13) vaccine. One dose is recommended after age 15.  Pneumococcal polysaccharide (PPSV23) vaccine. One dose is recommended after age 48. Talk to your health care provider about which screenings and vaccines you need and how often you need them. This information is not intended to replace advice given to you by your health care provider. Make sure you discuss any questions you have with your health care provider. Document Released: 11/24/2015 Document Revised: 07/17/2016 Document Reviewed: 08/29/2015 Elsevier Interactive Patient Education  2017 [place] Prevention in the Home Falls can cause injuries. They can happen to people of all ages. There are many things you can do to make your home safe and to help prevent falls. What can I do on the outside of my home?  Regularly fix the edges of walkways and driveways and fix any cracks.  Remove anything that might make you trip as you walk through a door, such as a raised step or threshold.  Trim any bushes or trees on the path to your home.  Use bright outdoor lighting.  Clear any walking paths of anything that might make someone trip, such as rocks or tools.  Regularly check to see if handrails are loose or broken. Make sure that both sides of any steps have handrails.  Any raised decks and porches should have guardrails on the  edges.  Have any leaves, snow, or ice cleared regularly.  Use sand or salt on walking paths during winter.  Clean up any spills in your garage right away. This includes oil or grease spills. What can I do in the bathroom?  Use night lights.  Install grab bars by the toilet and in the tub and shower. Do not use towel bars as grab bars.  Use non-skid mats or decals in the tub or shower.  If you need to sit down in the shower, use a plastic, non-slip stool.  Keep the floor dry. Clean up any water that spills on the floor as soon as it happens.  Remove soap buildup in the tub or shower regularly.  Attach bath mats securely with double-sided non-slip rug tape.  Do not have throw rugs and other things on the floor that can make you trip. What can I do in the bedroom?  Use night lights.  Make sure that you have a light by your bed that is easy to reach.  Do not use any sheets or blankets that are too big for your bed. They should not hang down onto the floor.  Have a firm chair  that has side arms. You can use this for support while you get dressed.  Do not have throw rugs and other things on the floor that can make you trip. What can I do in the kitchen?  Clean up any spills right away.  Avoid walking on wet floors.  Keep items that you use a lot in easy-to-reach places.  If you need to reach something above you, use a strong step stool that has a grab bar.  Keep electrical cords out of the way.  Do not use floor polish or wax that makes floors slippery. If you must use wax, use non-skid floor wax.  Do not have throw rugs and other things on the floor that can make you trip. What can I do with my stairs?  Do not leave any items on the stairs.  Make sure that there are handrails on both sides of the stairs and use them. Fix handrails that are broken or loose. Make sure that handrails are as long as the stairways.  Check any carpeting to make sure that it is firmly  attached to the stairs. Fix any carpet that is loose or worn.  Avoid having throw rugs at the top or bottom of the stairs. If you do have throw rugs, attach them to the floor with carpet tape.  Make sure that you have a light switch at the top of the stairs and the bottom of the stairs. If you do not have them, ask someone to add them for you. What else can I do to help prevent falls?  Wear shoes that:  Do not have high heels.  Have rubber bottoms.  Are comfortable and fit you well.  Are closed at the toe. Do not wear sandals.  If you use a stepladder:  Make sure that it is fully opened. Do not climb a closed stepladder.  Make sure that both sides of the stepladder are locked into place.  Ask someone to hold it for you, if possible.  Clearly mark and make sure that you can see:  Any grab bars or handrails.  First and last steps.  Where the edge of each step is.  Use tools that help you move around (mobility aids) if they are needed. These include:  Canes.  Walkers.  Scooters.  Crutches.  Turn on the lights when you go into a dark area. Replace any light bulbs as soon as they burn out.  Set up your furniture so you have a clear path. Avoid moving your furniture around.  If any of your floors are uneven, fix them.  If there are any pets around you, be aware of where they are.  Review your medicines with your doctor. Some medicines can make you feel dizzy. This can increase your chance of falling. Ask your doctor what other things that you can do to help prevent falls. This information is not intended to replace advice given to you by your health care provider. Make sure you discuss any questions you have with your health care provider. Document Released: 08/24/2009 Document Revised: 04/04/2016 Document Reviewed: 12/02/2014 Elsevier Interactive Patient Education  2017 [employer].

## 2020-05-25 ENCOUNTER — Ambulatory Visit (INDEPENDENT_AMBULATORY_CARE_PROVIDER_SITE_OTHER): Payer: Medicare HMO | Admitting: Physician Assistant

## 2020-05-25 ENCOUNTER — Encounter: Payer: Self-pay | Admitting: Physician Assistant

## 2020-05-25 VITALS — BP 138/74 | HR 80 | Ht 68.0 in | Wt 227.0 lb

## 2020-05-25 DIAGNOSIS — N138 Other obstructive and reflux uropathy: Secondary | ICD-10-CM | POA: Diagnosis not present

## 2020-05-25 DIAGNOSIS — N401 Enlarged prostate with lower urinary tract symptoms: Secondary | ICD-10-CM

## 2020-05-25 LAB — BLADDER SCAN AMB NON-IMAGING: Scan Result: 385

## 2020-05-25 NOTE — Telephone Encounter (Signed)
Pt has appt on 06/27/2020 at 08:30am at [facility] with Dr Manuella Ghazi

## 2020-05-25 NOTE — Progress Notes (Signed)
05/25/2020 4:42 PM   Donald [name] [birth year] [id number]  CC: Chief Complaint  Patient presents with  . Other    HPI: Donald [name] is a 86 y.o. male with PMH BPH on Flomax and finasteride with refractory LUTS who presents today for symptom recheck on Myrbetriq 25 mg daily x1 month per Dr. Bernardo Heater.  Today, patient reports slight improvement in urinary frequency and urgency on Myrbetriq.  He denies lower abdominal pain or the sensation of incomplete voiding.  Notably, he does have a history of emphysema managed by Dr. Lanney Gins.   PVR 334mL.  PMH: Past Medical History:  Diagnosis Date  . Anemia   . Arthritis   . BPH (benign prostatic hyperplasia)   . Cancer ([place])   . COPD (chronic obstructive pulmonary disease) ([place])   . Emphysema of lung ([place])   . Emphysema of lung ([place])   . Esophageal reflux   . Hyperlipidemia   . Hypertension   . Internal carotid artery stenosis, right   . Neuropathy of both feet   . Pneumonia    recurrent  . PTSD (post-traumatic stress disorder)    [place] vet  . Sebaceous cyst   . SOB (shortness of breath) on exertion   . TIA (transient ischemic attack)   . Urge incontinence     Surgical History: Past Surgical History:  Procedure Laterality Date  . CYST EXCISION     buttocks  . ELECTROMAGNETIC NAVIGATION BROCHOSCOPY Left 08/23/2019   Procedure: ELECTROMAGNETIC NAVIGATION BRONCHOSCOPY;  Surgeon: Tyler Pita, MD;  Location: [facility] ORS;  Service: Cardiopulmonary;  Laterality: Left;  . ENDOBRONCHIAL ULTRASOUND N/A 08/23/2019   Procedure: ENDOBRONCHIAL ULTRASOUND;  Surgeon: Tyler Pita, MD;  Location: [facility] ORS;  Service: Cardiopulmonary;  Laterality: N/A;  . HEMORRHOID SURGERY    . shave biopsy  03/13/2020   left neck 03/13/20 [facility] wart with atypica inflamed +AK no carcinoma   . SINUSOTOMY      Home Medications:  Allergies as of 05/25/2020      Reactions   Cephalexin Rash      Medication  List       Accurate as of May 25, 2020  4:42 PM. If you have any questions, ask your nurse or doctor.        albuterol 108 (90 Base) MCG/ACT inhaler Commonly known as: VENTOLIN HFA Inhale 2 puffs into the lungs every 6 (six) hours as needed for wheezing or shortness of breath.   Alpha-Lipoic Acid 600 MG Caps Take 1 capsule (600 mg total) by mouth in the morning and at bedtime.   amLODipine 5 MG tablet Commonly known as: NORVASC Take 1 tablet (5 mg total) by mouth 2 (two) times daily. What changed: when to take this   aspirin EC 81 MG tablet Take 81 mg by mouth daily.   atorvastatin 80 MG tablet Commonly known as: LIPITOR Take 40 mg by mouth daily.   cetirizine 10 MG tablet Commonly known as: ZYRTEC Take 1 tablet (10 mg total) by mouth daily.   cholecalciferol 1000 units tablet Commonly known as: VITAMIN D Take 1,000 Units by mouth 2 (two) times daily.   DULoxetine 30 MG capsule Commonly known as: Cymbalta Take 1 capsule (30 mg total) by mouth daily.   dutasteride 0.5 MG capsule Commonly known as: AVODART Take 1 capsule (0.5 mg total) by mouth daily.   famotidine 20 MG tablet Commonly known as: PEPCID Take 1 tablet (20 mg total) by mouth daily as  needed for heartburn or indigestion.   gabapentin 300 MG capsule Commonly known as: NEURONTIN Take 1 capsule (300 mg total) by mouth at bedtime.   hydrochlorothiazide 25 MG tablet Commonly known as: HYDRODIURIL Take 0.5 tablets (12.5 mg total) by mouth daily.   lisinopril 10 MG tablet Commonly known as: ZESTRIL Take 1 tablet (10 mg total) by mouth daily.   Mega Multivitamin for Men Tabs Take 1 tablet by mouth daily.   omeprazole 40 MG capsule Commonly known as: PRILOSEC Take 1 capsule (40 mg total) by mouth daily.   tamsulosin 0.4 MG Caps capsule Commonly known as: FLOMAX Take 1 capsule (0.4 mg total) by mouth daily.   Tiotropium Bromide-Olodaterol 2.5-2.5 MCG/ACT Aers Inhale 2 puffs into the lungs  daily.   valACYclovir 500 MG tablet Commonly known as: VALTREX Take 1 tablet (500 mg total) by mouth every other day.       Allergies:  Allergies  Allergen Reactions  . Cephalexin Rash    Family History: Family History  Problem Relation Age of Onset  . Heart disease Mother   . Cancer Maternal Aunt        lung  . Lung cancer Maternal Aunt   . Leukemia Maternal Aunt   . Hypertension Son     Social History:   reports that he quit smoking about 20 years ago. His smoking use included cigarettes. He has a 75.00 pack-year smoking history. He has never used smokeless tobacco. He reports that he does not drink alcohol and does not use drugs.  Physical Exam: BP 138/74   Pulse 80   Ht 5\' 8"  (1.727 m)   Wt 227 lb (103 kg)   BMI 34.52 kg/m   Constitutional:  Alert and oriented, no acute distress, nontoxic appearing HEENT: [place], AT Cardiovascular: No clubbing, cyanosis, or edema Respiratory: Normal respiratory effort, no increased work of breathing Skin: No rashes, bruises or suspicious lesions Neurologic: Grossly intact, no focal deficits, moving all 4 extremities Psychiatric: Normal mood and affect  Laboratory Data: Results for orders placed or performed in visit on 05/25/20  Bladder Scan (Post Void Residual) in office  Result Value Ref Range   Scan Result 385    Assessment & Plan:   1. Benign prostatic hyperplasia with urinary obstruction 86 year old male with BPH with refractory LUTS despite dual pharmacotherapy presents today for symptom recheck on Myrbetriq.  He has had slight improvement of his urinary symptoms on this medication, however PVR is significantly elevated today.  Patient is asymptomatic of this.  Recommended patient to discontinue Myrbetriq at this time.  Given this finding, I have recommended that he consider bladder outlet surgery.  He is curious about this.  We discussed UroLift versus HOLEP today.  He would like to proceed with bladder outlet consultation  at this time.  We will arrange for cystoscopy and TRUS in 4 weeks with Dr. Bernardo Heater. - Bladder Scan (Post Void Residual) in office  Return in about 4 weeks (around 06/22/2020) for Cysto and TRUS (bladder outlet consultation).  Debroah Loop, PA-C  [facility] [address] [place], [place] [address] [phone]

## 2020-05-25 NOTE — Patient Instructions (Addendum)
Stop Myrbetriq. We'll see you back in clinic in 1 month for a cystoscopy and ultrasound of your prostate to determine which prostate surgery you would be a good candidate for. Cystoscopy Cystoscopy is a procedure that is used to help diagnose and sometimes treat conditions that affect the lower urinary tract. The lower urinary tract includes the bladder and the urethra. The urethra is the tube that drains urine from the bladder. Cystoscopy is done using a thin, tube-shaped instrument with a light and camera at the end (cystoscope). The cystoscope may be hard or flexible, depending on the goal of the procedure. The cystoscope is inserted through the urethra, into the bladder. Cystoscopy may be recommended if you have:  Urinary tract infections that keep coming back.  Blood in the urine (hematuria).  An inability to control when you urinate (urinary incontinence) or an overactive bladder.  Unusual cells found in a urine sample.  A blockage in the urethra, such as a urinary stone.  Painful urination.  An abnormality in the bladder found during an intravenous pyelogram (IVP) or CT scan. Cystoscopy may also be done to remove a sample of tissue to be examined under a microscope (biopsy). Tell a health care provider about:  Any allergies you have.  All medicines you are taking, including vitamins, herbs, eye drops, creams, and over-the-counter medicines.  Any problems you or family members have had with anesthetic medicines.  Any blood disorders you have.  Any surgeries you have had.  Any medical conditions you have.  Whether you are pregnant or may be pregnant. What are the risks? Generally, this is a safe procedure. However, problems may occur, including:  Infection.  Bleeding.  Allergic reactions to medicines.  Damage to other structures or organs. What happens before the procedure?  Ask your health care provider about: ? Changing or stopping your regular medicines. This is  especially important if you are taking diabetes medicines or blood thinners. ? Taking medicines such as aspirin and ibuprofen. These medicines can thin your blood. Do not take these medicines unless your health care provider tells you to take them. ? Taking over-the-counter medicines, vitamins, herbs, and supplements.  Follow instructions from your health care provider about eating or drinking restrictions.  Ask your health care provider what steps will be taken to help prevent infection. These may include: ? Washing skin with a germ-killing soap. ? Taking antibiotic medicine.  You may have an exam or testing, such as: ? X-rays of the bladder, urethra, or kidneys. ? Urine tests to check for signs of infection.  Plan to have someone take you home from the hospital or clinic. What happens during the procedure?   You will be given one or more of the following: ? A medicine to help you relax (sedative). ? A medicine to numb the area (local anesthetic).  The area around the opening of your urethra will be cleaned.  The cystoscope will be passed through your urethra into your bladder.  Germ-free (sterile) fluid will flow through the cystoscope to fill your bladder. The fluid will stretch your bladder so that your health care provider can clearly examine your bladder walls.  Your doctor will look at the urethra and bladder. Your doctor may take a biopsy or remove stones.  The cystoscope will be removed, and your bladder will be emptied. The procedure may vary among health care providers and hospitals. What can I expect after the procedure? After the procedure, it is common to have:  Some soreness  or pain in your abdomen and urethra.  Urinary symptoms. These include: ? Mild pain or burning when you urinate. Pain should stop within a few minutes after you urinate. This may last for up to 1 week. ? A small amount of blood in your urine for several days. ? Feeling like you need to  urinate but producing only a small amount of urine. Follow these instructions at home: Medicines  Take over-the-counter and prescription medicines only as told by your health care provider.  If you were prescribed an antibiotic medicine, take it as told by your health care provider. Do not stop taking the antibiotic even if you start to feel better. General instructions  Return to your normal activities as told by your health care provider. Ask your health care provider what activities are safe for you.  Do not drive for 24 hours if you were given a sedative during your procedure.  Watch for any blood in your urine. If the amount of blood in your urine increases, call your health care provider.  Follow instructions from your health care provider about eating or drinking restrictions.  If a tissue sample was removed for testing (biopsy) during your procedure, it is up to you to get your test results. Ask your health care provider, or the department that is doing the test, when your results will be ready.  Drink enough fluid to keep your urine pale yellow.  Keep all follow-up visits as told by your health care provider. This is important. Contact a health care provider if you:  Have pain that gets worse or does not get better with medicine, especially pain when you urinate.  Have trouble urinating.  Have more blood in your urine. Get help right away if you:  Have blood clots in your urine.  Have abdominal pain.  Have a fever or chills.  Are unable to urinate. Summary  Cystoscopy is a procedure that is used to help diagnose and sometimes treat conditions that affect the lower urinary tract.  Cystoscopy is done using a thin, tube-shaped instrument with a light and camera at the end.  After the procedure, it is common to have some soreness or pain in your abdomen and urethra.  Watch for any blood in your urine. If the amount of blood in your urine increases, call your health  care provider.  If you were prescribed an antibiotic medicine, take it as told by your health care provider. Do not stop taking the antibiotic even if you start to feel better. This information is not intended to replace advice given to you by your health care provider. Make sure you discuss any questions you have with your health care provider. Document Revised: 10/20/2018 Document Reviewed: 10/20/2018 Elsevier Patient Education  [place].  Transrectal Ultrasound Transrectal ultrasound is a procedure that uses sound waves to create images of your prostate gland and nearby tissues. The sound waves are sent through the wall of your rectum into your prostate gland, which is located in front of your rectum. The images show the size and shape of your prostate gland and nearby structures. You may have this test if you have:  Trouble urinating.  Infertility.  An abnormal prostate screening exam. Tell a health care provider about:  Any allergies you have.  All medicines you are taking, including vitamins, herbs, eye drops, creams, and over-the-counter medicines.  Any blood disorders you have.  Any medical conditions you have.  Any surgeries you have had. What are  the risks? Generally, this is a safe procedure. However, problems may occur, including:  Discomfort during the procedure. This is rare.  Blood in your urine or sperm after the procedure. This is rare. What happens before the procedure?  Your health care provider may instruct you to use an enema 1-4 hours before the procedure. Follow instructions from your health care provider about how to do the enema.  Ask your health care provider about changing or stopping your regular medicines. This is especially important if you are taking diabetes medicines or blood thinners. What happens during the procedure?  You will be asked to lie down on your left side on an examination table.  You will bend your knees toward your  chest.  A lubricated probe will be gently inserted into your rectum. This may cause a feeling of fullness.  The probe will send signals to a computer that will create images.  The technician will slightly rotate the probe throughout the procedure. While rotating the probe, he or she will view and capture images of the prostate gland and the surrounding structures from different angles.  The probe will be removed. The procedure may vary among health care providers and hospitals. What happens after the procedure?  It is up to you to get the results of your procedure. Ask your health care provider, or the department that is doing the procedure, when your results will be ready. Summary  Transrectal ultrasound is a procedure that uses sound waves to create images of your prostate gland and nearby tissues.  The images show the size and shape of your prostate gland and nearby structures.  Before the procedure, ask your health care provider about changing or stopping your regular medicines. This is especially important if you are taking diabetes medicines or blood thinners. This information is not intended to replace advice given to you by your health care provider. Make sure you discuss any questions you have with your health care provider. Document Revised: 10/10/2017 Document Reviewed: 09/20/2016 Elsevier Patient Education  2020 [employer].

## 2020-05-26 ENCOUNTER — Encounter: Payer: Self-pay | Admitting: Oncology

## 2020-05-26 NOTE — Progress Notes (Signed)
Patient was called for pre assessment. Patient states his feet and legs been swelling for about a month or so. He denies any pain or other concerns at this time.

## 2020-05-27 NOTE — Progress Notes (Signed)
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   INTERVAL HISTORY: Patient returns to clinic today for routine 41-month evaluation and discussion of his imaging results.  He currently feels well and is asymptomatic.  Patient returns to clinic today for further evaluation and discussion of his imaging results.  He continues to feel well and remains asymptomatic. He has no neurologic complaints.  He denies any recent fevers or illnesses.  He has a good appetite and denies weight loss.  He denies any night sweats.  He has no chest pain, cough, hemoptysis, or shortness of breath.  He denies any nausea, vomiting, constipation, or diarrhea.  He has no urinary complaints.  Patient offers no specific complaints today.  Review of Systems  Constitutional: Negative.  Negative for fever, malaise/fatigue and weight loss.  Respiratory: Negative.   Cardiovascular: Negative.  Negative for chest pain and leg swelling.  Gastrointestinal: Negative.  Negative for abdominal pain.  Genitourinary: Negative.  Negative for dysuria.  Musculoskeletal: Negative.  Negative for back pain.  Skin: Negative.  Negative for rash.  Neurological: Negative.  Negative for dizziness, focal weakness, weakness and headaches.  Psychiatric/Behavioral: Negative.  The patient is not nervous/anxious.    As per HPI. Otherwise, a complete review of systems is negative.  PAST MEDICAL HISTORY: Past Medical History:  Diagnosis Date  . Anemia   . Arthritis   . BPH (benign prostatic hyperplasia)   . Cancer  ([place])   . COPD (chronic obstructive pulmonary disease) ([place])   . Emphysema of lung ([place])   . Emphysema of lung ([place])   . Esophageal reflux   . Hyperlipidemia   . Hypertension   . Internal carotid artery stenosis, right   . Neuropathy of both feet   . Pneumonia    recurrent  . PTSD (post-traumatic stress disorder)    [place] vet  . Sebaceous cyst   . SOB (shortness of breath) on exertion   . TIA (transient ischemic attack)   . Urge incontinence     PAST SURGICAL HISTORY: Past Surgical History:  Procedure Laterality Date  . CYST EXCISION     buttocks  . ELECTROMAGNETIC NAVIGATION BROCHOSCOPY Left 08/23/2019   Procedure: ELECTROMAGNETIC NAVIGATION BRONCHOSCOPY;  Surgeon: Tyler Pita, MD;  Location: [facility] ORS;  Service: Cardiopulmonary;  Laterality: Left;  . ENDOBRONCHIAL ULTRASOUND N/A 08/23/2019   Procedure: ENDOBRONCHIAL ULTRASOUND;  Surgeon: Tyler Pita, MD;  Location: [facility] ORS;  Service: Cardiopulmonary;  Laterality: N/A;  . HEMORRHOID SURGERY    . shave biopsy  03/13/2020   left neck 03/13/20 Minor Derm Barnetta Chapel wart with atypica inflamed +AK no carcinoma   . SINUSOTOMY      FAMILY HISTORY: Family History  Problem Relation Age of Onset  . Heart disease Mother   . Cancer Maternal Aunt        lung  . Lung cancer Maternal Aunt   . Leukemia Maternal Aunt   . Hypertension Son     ADVANCED DIRECTIVES (Y/N):  N  HEALTH MAINTENANCE: Social History   Tobacco Use  . Smoking status: Former Smoker    Packs/day: 1.50    Years: 50.00  Pack years: 75.00    Types: Cigarettes    Quit date: 09/10/1999    Years since quitting: 20.7  . Smokeless tobacco: Never Used  Vaping Use  . Vaping Use: Never used  Substance Use Topics  . Alcohol use: No    Alcohol/week: 0.0 standard drinks    Comment: not since 12/1980  . Drug use: No     Colonoscopy:  PAP:  Bone density:  Lipid panel:  Allergies  Allergen Reactions  . Cephalexin Rash    Current  Outpatient Medications  Medication Sig Dispense Refill  . albuterol (VENTOLIN HFA) 108 (90 Base) MCG/ACT inhaler Inhale 2 puffs into the lungs every 6 (six) hours as needed for wheezing or shortness of breath. 18 g 6  . Alpha-Lipoic Acid 600 MG CAPS Take 1 capsule (600 mg total) by mouth in the morning and at bedtime. 180 capsule 3  . amLODipine (NORVASC) 5 MG tablet Take 1 tablet (5 mg total) by mouth 2 (two) times daily. (Patient taking differently: Take 5 mg by mouth daily. ) 180 tablet 1  . aspirin EC 81 MG tablet Take 81 mg by mouth daily.     Marland Kitchen atorvastatin (LIPITOR) 80 MG tablet Take 40 mg by mouth daily.    . cetirizine (ZYRTEC) 10 MG tablet Take 1 tablet (10 mg total) by mouth daily. 30 tablet 11  . cholecalciferol (VITAMIN D) 1000 UNITS tablet Take 1,000 Units by mouth 2 (two) times daily.     . DULoxetine (CYMBALTA) 30 MG capsule Take 1 capsule (30 mg total) by mouth daily.    Marland Kitchen dutasteride (AVODART) 0.5 MG capsule Take 1 capsule (0.5 mg total) by mouth daily. 90 capsule 2  . famotidine (PEPCID) 20 MG tablet Take 1 tablet (20 mg total) by mouth daily as needed for heartburn or indigestion.    . gabapentin (NEURONTIN) 300 MG capsule Take 1 capsule (300 mg total) by mouth at bedtime. 90 capsule 3  . hydrochlorothiazide (HYDRODIURIL) 25 MG tablet Take 0.5 tablets (12.5 mg total) by mouth daily.    Marland Kitchen lisinopril (ZESTRIL) 10 MG tablet Take 1 tablet (10 mg total) by mouth daily. 90 tablet 3  . Multiple Vitamins-Minerals (MEGA MULTIVITAMIN FOR MEN) TABS Take 1 tablet by mouth daily.     Marland Kitchen omeprazole (PRILOSEC) 40 MG capsule Take 1 capsule (40 mg total) by mouth daily.    . tamsulosin (FLOMAX) 0.4 MG CAPS capsule Take 1 capsule (0.4 mg total) by mouth daily. 90 capsule 2  . Tiotropium Bromide-Olodaterol 2.5-2.5 MCG/ACT AERS Inhale 2 puffs into the lungs daily.    . valACYclovir (VALTREX) 500 MG tablet Take 1 tablet (500 mg total) by mouth every other day. 15 tablet 3   No current  facility-administered medications for this visit.    OBJECTIVE: Vitals:   05/29/20 1025  BP: (!) 143/49  Pulse: (!) 58  Resp: 20  Temp: 97.8 F (36.6 C)  SpO2: 100%     Body mass index is 34.86 kg/m.    ECOG FS:0 - Asymptomatic  General: Well-developed, well-nourished, no acute distress. Eyes: Pink conjunctiva, anicteric sclera. HEENT: Normocephalic, moist mucous membranes. Lungs: No audible wheezing or coughing. Heart: Regular rate and rhythm. Abdomen: Soft, nontender, no obvious distention. Musculoskeletal: No edema, cyanosis, or clubbing. Neuro: Alert, answering all questions appropriately. Cranial nerves grossly intact. Skin: No rashes or petechiae noted. Psych: Normal affect.  LAB RESULTS:  Lab Results  Component Value Date   NA 139 03/06/2020   K 4.5  03/06/2020   CL 106 03/06/2020   CO2 25 03/06/2020   GLUCOSE 113 (H) 03/06/2020   BUN 22 03/06/2020   CREATININE 1.50 (H) 05/24/2020   CALCIUM 9.2 03/06/2020   PROT 6.5 03/06/2020   ALBUMIN 4.3 03/06/2020   AST 18 03/06/2020   ALT 15 03/06/2020   ALKPHOS 80 03/06/2020   BILITOT 0.3 03/06/2020   GFRNONAA 41 (L) 02/28/2020   GFRAA 47 (L) 02/28/2020    Lab Results  Component Value Date   WBC 9.2 03/06/2020   NEUTROABS 4.0 03/06/2020   HGB 10.3 (L) 03/06/2020   HCT 31.2 (L) 03/06/2020   MCV 86.5 03/06/2020   PLT 214.0 03/06/2020     STUDIES: CT Chest W Contrast  Result Date: 05/24/2020 CLINICAL DATA:  Pulmonary nodules.  CLL. EXAM: CT CHEST WITH CONTRAST TECHNIQUE: Multidetector CT imaging of the chest was performed during intravenous contrast administration. CONTRAST:  72mL OMNIPAQUE IOHEXOL 300 MG/ML  SOLN COMPARISON:  02/28/2020, 07/27/2019 and 07/21/2018. FINDINGS: Cardiovascular: Atherosclerotic calcification of the aorta, aortic valve and coronary arteries. Heart is at the upper limits of normal in size. No pericardial effusion. Mediastinum/Nodes: Mediastinal and hilar lymph nodes measure up to 1.8 cm  in the low right paratracheal station, stable. Subcarinal lymph node measures 1.9 cm, also stable. There are numerous axillary lymph nodes, none of which is considered enlarged by CT size criteria. Esophagus is unremarkable. Lungs/Pleura: Slightly spiculated nodule in the apical left upper lobe measures 1.0 x 1.5 cm (3/25) has progressively enlarged from 03/12/2018, at which time it measured 4 mm. Centrilobular emphysema. 2 mm posterior right upper lobe nodule (3/32), stable from 07/27/2019 but new from 07/21/2018. Previously seen peribronchovascular nodularity and ground-glass in the right lower lobe, largely resolved in the interval with residual nodularity in the medial right lower lobe (3/114-116). Calcified granulomas. Question mild bibasilar subpleural reticular densities. No pleural fluid. Airway is unremarkable. Upper Abdomen: Visualized portions of the liver, gallbladder, adrenal glands, left kidney, spleen, pancreas, stomach and bowel are unremarkable. Haziness and slight nodularity at the root of the small bowel mesentery, similar and incompletely imaged. There are calcified periportal lymph nodes. Musculoskeletal: Degenerative changes in the spine. No worrisome lytic or sclerotic lesions. IMPRESSION: 1. Indolent enlargement of a slightly spiculated apical left upper lobe nodule, highly worrisome for primary bronchogenic carcinoma. 2. Improved appearance of peribronchovascular ground-glass and nodularity in the right lower lobe with residual clustered nodularity, likely representing postinfectious scarring. Continued attention on follow-up exams is warranted. 3. 2 mm right upper lobe nodule is stable from 07/27/2019 but new from 07/21/2018. Again, continued attention on follow-up exams is warranted. 4. Mild basilar subpleural reticular densities can be seen in the setting of interstitial lung disease. 5. Mediastinal adenopathy with additional enlarged lymph nodes in the chest and abdomen, stable and  consistent with history of CLL. 6. Aortic atherosclerosis (ICD10-I70.0). Coronary artery calcification. Electronically Signed   By: Lorin Picket M.D.   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  CT scan results from May 24, 2020 reviewed independently and report as above with essentially unchanged in his known lymphadenopathy in bilateral axilla, mediastinal, retroperitoneal, iliac, and inguinal lymph nodes.  PET scan results from August 05, 2019 with minimal metabolic changes to known lymphadenopathy compatible with a chronic low-grade lymphoproliferative disorder.  Previously, his flow cytometry panel reported a CD5+ and CD23+ clonal  B-cell population, highly suspicious for CLL/SLL.  Patient does not require biopsy, but will consider one if patient required treatment.  Continue to monitor closely.   2.  Left upper lobe pulmonary nodule: Despite lesion being hypermetabolic on PET scan from August 05, 2019, subsequent navigational biopsy was inconclusive.  CT scan results as above with mild progression of disease.  Will discuss at tumor board later this week to determine whether repeat PET scan and/or biopsy versus continued surveillance is warranted.  Follow-up will be based on discussion from tumor board. 3.  Anemia: Chronic and unchanged.  Patient expressed understanding and was in agreement with this plan. He also understands that He can call clinic at any time with any questions, concerns, or complaints.   Cancer Staging No matching staging information was found for the patient.  Lloyd Huger, MD   05/29/2020 2:08 PM

## 2020-05-29 ENCOUNTER — Inpatient Hospital Stay: Payer: Medicare HMO | Attending: Oncology | Admitting: Oncology

## 2020-05-29 ENCOUNTER — Other Ambulatory Visit: Payer: Self-pay

## 2020-05-29 ENCOUNTER — Encounter: Payer: Self-pay | Admitting: Oncology

## 2020-05-29 VITALS — BP 143/49 | HR 58 | Temp 97.8°F | Resp 20 | Ht 68.0 in | Wt 229.3 lb

## 2020-05-29 DIAGNOSIS — N400 Enlarged prostate without lower urinary tract symptoms: Secondary | ICD-10-CM | POA: Diagnosis not present

## 2020-05-29 DIAGNOSIS — Z87891 Personal history of nicotine dependence: Secondary | ICD-10-CM | POA: Insufficient documentation

## 2020-05-29 DIAGNOSIS — K219 Gastro-esophageal reflux disease without esophagitis: Secondary | ICD-10-CM | POA: Insufficient documentation

## 2020-05-29 DIAGNOSIS — E785 Hyperlipidemia, unspecified: Secondary | ICD-10-CM | POA: Insufficient documentation

## 2020-05-29 DIAGNOSIS — Z801 Family history of malignant neoplasm of trachea, bronchus and lung: Secondary | ICD-10-CM | POA: Insufficient documentation

## 2020-05-29 DIAGNOSIS — D649 Anemia, unspecified: Secondary | ICD-10-CM | POA: Diagnosis not present

## 2020-05-29 DIAGNOSIS — Z8249 Family history of ischemic heart disease and other diseases of the circulatory system: Secondary | ICD-10-CM | POA: Insufficient documentation

## 2020-05-29 DIAGNOSIS — J449 Chronic obstructive pulmonary disease, unspecified: Secondary | ICD-10-CM | POA: Insufficient documentation

## 2020-05-29 DIAGNOSIS — Z7982 Long term (current) use of aspirin: Secondary | ICD-10-CM | POA: Diagnosis not present

## 2020-05-29 DIAGNOSIS — I10 Essential (primary) hypertension: Secondary | ICD-10-CM | POA: Diagnosis not present

## 2020-05-29 DIAGNOSIS — C9110 Chronic lymphocytic leukemia of B-cell type not having achieved remission: Secondary | ICD-10-CM

## 2020-05-29 DIAGNOSIS — Z8673 Personal history of transient ischemic attack (TIA), and cerebral infarction without residual deficits: Secondary | ICD-10-CM | POA: Insufficient documentation

## 2020-05-29 DIAGNOSIS — R911 Solitary pulmonary nodule: Secondary | ICD-10-CM

## 2020-05-29 DIAGNOSIS — Z79899 Other long term (current) drug therapy: Secondary | ICD-10-CM | POA: Diagnosis not present

## 2020-06-01 ENCOUNTER — Telehealth: Payer: Self-pay | Admitting: *Deleted

## 2020-06-01 ENCOUNTER — Other Ambulatory Visit: Payer: Medicare HMO

## 2020-06-01 NOTE — Telephone Encounter (Signed)
Called and spoke with pt to update on recommendations discussed at case conference from today. Per discussion, pt will need repeat bronchoscopy with Dr. Lanney Gins. Pt made aware and is in agreement at this time. Will touch base with Dr. Lanney Gins to get patient scheduled for procedure or office visit. Informed pt that will be notified when he is scheduled for his appt with Dr. Lanney Gins. Pt verbalized understanding. Instructed to call back with any further questions or needs.

## 2020-06-02 NOTE — Telephone Encounter (Signed)
Thank you!  I can see him 1-2 weeks after the biopsy to discuss the results.

## 2020-06-02 NOTE — Progress Notes (Signed)
Donald [name]  Donald [name] was presented by Donald Mayo, RN at our Donald Board on 06/01/2020, which included representatives from medical oncology, radiation oncology, internal medicine, navigation, pathology, radiology, surgical, pharmacy, genetics, palliative care, pulmonology.  Donald [name] currently presents as a current patient, for discussion with history of the following treatments: active survellience.  Additionally, we reviewed previous medical and familial history, history of present illness, and recent lab results along with all available histopathologic and imaging studies. The Donald board considered available treatment options and made the following recommendations: Biopsy (Navigational biopsy, refer to Dr Duwayne Heck)    The following procedures/referrals were also placed: No orders of the defined types were placed in this encounter.   Clinical Trial Status: not discussed   Staging used:    National site-specific guidelines   were discussed with respect to the case.  Donald board is a meeting of clinicians from various specialty areas who evaluate and discuss patients for whom a multidisciplinary approach is being considered. Final determinations in the plan of care are those of the provider(s). The responsibility for follow up of recommendations given during Donald board is that of the provider.   Today's extended care, comprehensive team conference, [name] was not present for the discussion and was not examined.   Multidisciplinary Donald Board is a multidisciplinary case peer review process.  Decisions discussed in the Multidisciplinary Donald Board reflect the opinions of the specialists present at the conference without having examined the patient.  Ultimately, treatment and diagnostic decisions rest with the primary provider(s) and the patient.

## 2020-06-06 ENCOUNTER — Other Ambulatory Visit: Payer: Self-pay | Admitting: Pulmonary Disease

## 2020-06-06 DIAGNOSIS — L089 Local infection of the skin and subcutaneous tissue, unspecified: Secondary | ICD-10-CM | POA: Diagnosis not present

## 2020-06-06 DIAGNOSIS — R911 Solitary pulmonary nodule: Secondary | ICD-10-CM

## 2020-06-06 DIAGNOSIS — E1142 Type 2 diabetes mellitus with diabetic polyneuropathy: Secondary | ICD-10-CM | POA: Diagnosis not present

## 2020-06-06 DIAGNOSIS — S90424A Blister (nonthermal), right lesser toe(s), initial encounter: Secondary | ICD-10-CM | POA: Diagnosis not present

## 2020-06-06 DIAGNOSIS — I739 Peripheral vascular disease, unspecified: Secondary | ICD-10-CM | POA: Diagnosis not present

## 2020-06-08 ENCOUNTER — Telehealth: Payer: Self-pay | Admitting: Internal Medicine

## 2020-06-08 ENCOUNTER — Emergency Department: Payer: Medicare HMO

## 2020-06-08 ENCOUNTER — Encounter: Payer: Self-pay | Admitting: Emergency Medicine

## 2020-06-08 ENCOUNTER — Emergency Department
Admission: EM | Admit: 2020-06-08 | Discharge: 2020-06-08 | Disposition: A | Discharge status: Home / Self Care | Payer: Medicare HMO | Attending: Emergency Medicine | Admitting: Emergency Medicine

## 2020-06-08 DIAGNOSIS — Z87891 Personal history of nicotine dependence: Secondary | ICD-10-CM | POA: Insufficient documentation

## 2020-06-08 DIAGNOSIS — R0902 Hypoxemia: Secondary | ICD-10-CM | POA: Diagnosis not present

## 2020-06-08 DIAGNOSIS — R079 Chest pain, unspecified: Secondary | ICD-10-CM | POA: Diagnosis not present

## 2020-06-08 DIAGNOSIS — Y9389 Activity, other specified: Secondary | ICD-10-CM | POA: Diagnosis not present

## 2020-06-08 DIAGNOSIS — S20211A Contusion of right front wall of thorax, initial encounter: Secondary | ICD-10-CM | POA: Insufficient documentation

## 2020-06-08 DIAGNOSIS — I700 Atherosclerosis of aorta: Secondary | ICD-10-CM | POA: Diagnosis not present

## 2020-06-08 DIAGNOSIS — C3490 Malignant neoplasm of unspecified part of unspecified bronchus or lung: Secondary | ICD-10-CM | POA: Diagnosis not present

## 2020-06-08 DIAGNOSIS — R590 Localized enlarged lymph nodes: Secondary | ICD-10-CM | POA: Diagnosis not present

## 2020-06-08 DIAGNOSIS — Y92410 Unspecified street and highway as the place of occurrence of the external cause: Secondary | ICD-10-CM | POA: Diagnosis not present

## 2020-06-08 DIAGNOSIS — S20212A Contusion of left front wall of thorax, initial encounter: Secondary | ICD-10-CM | POA: Insufficient documentation

## 2020-06-08 DIAGNOSIS — S299XXA Unspecified injury of thorax, initial encounter: Secondary | ICD-10-CM | POA: Diagnosis not present

## 2020-06-08 DIAGNOSIS — S20219A Contusion of unspecified front wall of thorax, initial encounter: Secondary | ICD-10-CM | POA: Diagnosis not present

## 2020-06-08 DIAGNOSIS — Z7951 Long term (current) use of inhaled steroids: Secondary | ICD-10-CM | POA: Insufficient documentation

## 2020-06-08 DIAGNOSIS — I10 Essential (primary) hypertension: Secondary | ICD-10-CM | POA: Diagnosis not present

## 2020-06-08 DIAGNOSIS — S3991XA Unspecified injury of abdomen, initial encounter: Secondary | ICD-10-CM | POA: Diagnosis not present

## 2020-06-08 DIAGNOSIS — S199XXA Unspecified injury of neck, initial encounter: Secondary | ICD-10-CM | POA: Diagnosis not present

## 2020-06-08 DIAGNOSIS — E1165 Type 2 diabetes mellitus with hyperglycemia: Secondary | ICD-10-CM | POA: Diagnosis not present

## 2020-06-08 DIAGNOSIS — F4310 Post-traumatic stress disorder, unspecified: Secondary | ICD-10-CM | POA: Diagnosis not present

## 2020-06-08 DIAGNOSIS — Z79899 Other long term (current) drug therapy: Secondary | ICD-10-CM | POA: Diagnosis not present

## 2020-06-08 DIAGNOSIS — J449 Chronic obstructive pulmonary disease, unspecified: Secondary | ICD-10-CM | POA: Diagnosis not present

## 2020-06-08 DIAGNOSIS — C9110 Chronic lymphocytic leukemia of B-cell type not having achieved remission: Secondary | ICD-10-CM | POA: Diagnosis not present

## 2020-06-08 DIAGNOSIS — S0990XA Unspecified injury of head, initial encounter: Secondary | ICD-10-CM | POA: Diagnosis not present

## 2020-06-08 DIAGNOSIS — Z8673 Personal history of transient ischemic attack (TIA), and cerebral infarction without residual deficits: Secondary | ICD-10-CM | POA: Diagnosis not present

## 2020-06-08 DIAGNOSIS — Z7982 Long term (current) use of aspirin: Secondary | ICD-10-CM | POA: Insufficient documentation

## 2020-06-08 DIAGNOSIS — Y999 Unspecified external cause status: Secondary | ICD-10-CM | POA: Diagnosis not present

## 2020-06-08 DIAGNOSIS — S3993XA Unspecified injury of pelvis, initial encounter: Secondary | ICD-10-CM | POA: Diagnosis not present

## 2020-06-08 LAB — BASIC METABOLIC PANEL
Anion gap: 7 (ref 5–15)
BUN: 26 mg/dL — ABNORMAL HIGH (ref 8–23)
CO2: 26 mmol/L (ref 22–32)
Calcium: 8.8 mg/dL — ABNORMAL LOW (ref 8.9–10.3)
Chloride: 106 mmol/L (ref 98–111)
Creatinine, Ser: 1.65 mg/dL — ABNORMAL HIGH (ref 0.61–1.24)
GFR calc Af Amer: 43 mL/min — ABNORMAL LOW (ref >60)
GFR calc non Af Amer: 37 mL/min — ABNORMAL LOW (ref >60)
Glucose, Bld: 120 mg/dL — ABNORMAL HIGH (ref 70–99)
Potassium: 3.9 mmol/L (ref 3.5–5.1)
Sodium: 139 mmol/L (ref 135–145)

## 2020-06-08 LAB — CBC WITH DIFFERENTIAL/PLATELET
Basophils Absolute: 0.2 10*3/uL — ABNORMAL HIGH (ref 0.0–0.1)
Basophils Relative: 1 %
Eosinophils Absolute: 0.8 10*3/uL — ABNORMAL HIGH (ref 0.0–0.5)
Eosinophils Relative: 5 %
HCT: 28.9 % — ABNORMAL LOW (ref 39.0–52.0)
Hemoglobin: 9.4 g/dL — ABNORMAL LOW (ref 13.0–17.0)
Lymphocytes Relative: 48 %
Lymphs Abs: 8.2 10*3/uL — ABNORMAL HIGH (ref 0.7–4.0)
MCH: 28.1 pg (ref 26.0–34.0)
MCHC: 32.5 g/dL (ref 30.0–36.0)
MCV: 86.3 fL (ref 80.0–100.0)
Monocytes Absolute: 1.3 10*3/uL — ABNORMAL HIGH (ref 0.1–1.0)
Monocytes Relative: 8 %
Neutro Abs: 6.1 10*3/uL (ref 1.7–7.7)
Neutrophils Relative %: 37 %
Platelets: 225 10*3/uL (ref 150–400)
RBC: 3.35 MIL/uL — ABNORMAL LOW (ref 4.22–5.81)
RDW: 15.2 % (ref 11.5–15.5)
Smear Review: NORMAL
WBC: 16.7 10*3/uL — ABNORMAL HIGH (ref 4.0–10.5)
nRBC: 0 % (ref 0.0–0.2)

## 2020-06-08 MED ORDER — GLUCOSE BLOOD VI STRP: 1.0000 | ORAL_STRIP | 1 refills | Status: DC | PRN

## 2020-06-08 MED ORDER — IOHEXOL 300 MG/ML  SOLN
75.0000 mL | Freq: Once | INTRAMUSCULAR | Status: AC | PRN
  Administered 2020-06-08: 75 mL via INTRAVENOUS
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
## 2020-06-08 NOTE — Telephone Encounter (Signed)
Pt's wife said pt needs Freestyle lite test strips sent to [facility].

## 2020-06-08 NOTE — ED Notes (Signed)
Pt transported to CT ?
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
## 2020-06-09 LAB — PATHOLOGIST SMEAR REVIEW

## 2020-06-13 ENCOUNTER — Telehealth: Payer: Self-pay

## 2020-06-13 DIAGNOSIS — L089 Local infection of the skin and subcutaneous tissue, unspecified: Secondary | ICD-10-CM | POA: Diagnosis not present

## 2020-06-13 DIAGNOSIS — R918 Other nonspecific abnormal finding of lung field: Secondary | ICD-10-CM | POA: Diagnosis not present

## 2020-06-13 DIAGNOSIS — S90424A Blister (nonthermal), right lesser toe(s), initial encounter: Secondary | ICD-10-CM | POA: Diagnosis not present

## 2020-06-13 NOTE — Telephone Encounter (Signed)
Pt is scheduled for OV with Dr. Patsey Berthold 06/13/2020 at 8:30, however it appears that pt seen Dr. Lanney Gins recently and has bronch scheduled for 06/26/2020. Lm for patient for clarification. Would he like to cancel appointment with our office?

## 2020-06-14 ENCOUNTER — Encounter: Payer: Self-pay | Admitting: Pulmonary Disease

## 2020-06-14 ENCOUNTER — Ambulatory Visit (INDEPENDENT_AMBULATORY_CARE_PROVIDER_SITE_OTHER): Payer: Medicare HMO | Admitting: Pulmonary Disease

## 2020-06-14 ENCOUNTER — Other Ambulatory Visit: Payer: Self-pay

## 2020-06-14 ENCOUNTER — Telehealth: Payer: Self-pay

## 2020-06-14 VITALS — BP 140/78 | HR 55 | Temp 97.8°F | Ht 68.0 in | Wt 231.6 lb

## 2020-06-14 DIAGNOSIS — R591 Generalized enlarged lymph nodes: Secondary | ICD-10-CM | POA: Diagnosis not present

## 2020-06-14 DIAGNOSIS — J449 Chronic obstructive pulmonary disease, unspecified: Secondary | ICD-10-CM

## 2020-06-14 DIAGNOSIS — E1165 Type 2 diabetes mellitus with hyperglycemia: Secondary | ICD-10-CM | POA: Diagnosis not present

## 2020-06-14 DIAGNOSIS — R911 Solitary pulmonary nodule: Secondary | ICD-10-CM | POA: Diagnosis not present

## 2020-06-14 DIAGNOSIS — C9110 Chronic lymphocytic leukemia of B-cell type not having achieved remission: Secondary | ICD-10-CM

## 2020-06-14 DIAGNOSIS — R0602 Shortness of breath: Secondary | ICD-10-CM

## 2020-06-14 NOTE — Telephone Encounter (Signed)
Pt seen in office 06/14/2020. Will close encounter.

## 2020-06-14 NOTE — Telephone Encounter (Addendum)
Phone pre admit  06/19/2020 between 1-5 and covid test 06/22/2020 between 8-1. Pt is aware of dates/times and voiced his understanding.

## 2020-06-14 NOTE — Telephone Encounter (Signed)
Pt has been scheduled for bronchoscopy with navigation and [employer] on 06/26/2020 at 1:00.  PP:NDLO nodule (R91.1) CPT: 31674,25525

## 2020-06-14 NOTE — Patient Instructions (Signed)
We have schedule you for bronchoscopy with navigational assistance for 16 August at 1 PM.  We will be calling you with the results prior to follow-up time  We will see you in follow-up in 4-6 weeks time call sooner should any new difficulties arise.
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
## 2020-06-15 ENCOUNTER — Inpatient Hospital Stay: Admission: RE | Admit: 2020-06-15 | Payer: Medicare HMO | Source: Ambulatory Visit

## 2020-06-15 NOTE — Telephone Encounter (Signed)
Spoke with Abbie B at [facility] at 2:27 pm EST. Per Ledell Noss instructions that I would need to contact primary care physician and advise them to request referral for procedure codes [id number] and [phone] with the Clinical Intake Team at [phone].   Spoke with Lorriane Shire at Dr. Demetrius Charity office and she contacted the Clinical Intake Team and was advised that by [facility] that no PA is required for these two procedures. Call Ref # [id number]. Rhonda J Cobb

## 2020-06-19 ENCOUNTER — Ambulatory Visit: Payer: Medicare HMO

## 2020-06-19 ENCOUNTER — Encounter
Admission: RE | Admit: 2020-06-19 | Discharge: 2020-06-19 | Disposition: A | Discharge status: Home / Self Care | Payer: Medicare HMO | Source: Ambulatory Visit | Attending: Pulmonary Disease | Admitting: Pulmonary Disease

## 2020-06-19 ENCOUNTER — Other Ambulatory Visit: Payer: Self-pay

## 2020-06-19 NOTE — Patient Instructions (Addendum)
Your procedure is scheduled on: Mon 8/16 Report to Day Surgery. To find out your arrival time please call [phone] between 1PM - 3PM on Friday 8/13.  Remember: Instructions that are not followed completely may result in serious medical risk,  up to and including death, or upon the discretion of your surgeon and anesthesiologist your  surgery may need to be rescheduled.     _X__ 1. Do not eat food after midnight the night before your procedure.                 No gum chewing or hard candies. You may drink clear liquids up to 2 hours                 before you are scheduled to arrive for your surgery- DO not drink clear                 liquids within 2 hours of the start of your surgery.                 Clear Liquids include:  water, apple juice without pulp, clear Gatorade, G2 or                  Gatorade Zero (avoid Red/Purple/Blue), Black Coffee or Tea (Do not add                 anything to coffee or tea). _____2.   Complete the carbohydrate drink provided to you, 2 hours before arrival.  __X__2.  On the morning of surgery brush your teeth with toothpaste and water, you                may rinse your mouth with mouthwash if you wish.  Do not swallow any toothpaste of mouthwash.     ___ 3.  No Alcohol for 24 hours before or after surgery.   ___ 4.  Do Not Smoke or use e-cigarettes For 24 Hours Prior to Your Surgery.                 Do not use any chewable tobacco products for at least 6 hours prior to                 Surgery.  ___  5.  Do not use any recreational drugs (marijuana, cocaine, heroin, ecstacy, MDMA or other)                For at least one week prior to your surgery.  Combination of these drugs with anesthesia                May have life threatening results.  ____  6.  Bring all medications with you on the day of surgery if instructed.   __x__  7.  Notify your doctor if there is any change in your medical condition      (cold, fever,  infections).     Do not wear jewelry,  Do not wear lotions,   Do not shave 48 hours prior to surgery. Men may shave face and neck. Do not bring valuables to the hospital.    [facility] is not responsible for any belongings or valuables.  Contacts, dentures or bridgework may not be worn into surgery. Leave your suitcase in the car. After surgery it may be brought to your room. For patients admitted to the hospital, discharge time is determined by your treatment team.   Patients discharged the day of surgery will  not be allowed to drive home.   Make arrangements for someone to be with you for the first 24 hours of your Same Day Discharge.    Please read over the following fact sheets that you were given:    _x___ Take these medicines the morning of surgery with A SIP OF WATER:    1. amLODipine (NORVASC) 5 MG tablet  2. DULoxetine (CYMBALTA) 40 mg  3. famotidine (PEPCID) 20 MG tablet  The night before   4. omeprazole (PRILOSEC) 40 MG capsule  5.  6.  ____ Fleet Enema (as directed)   ____ Use CHG Soap (or wipes) as directed  ____ Use Benzoyl Peroxide Gel as instructed  __x__ Use inhalers on the day of surgery albuterol (VENTOLIN HFA) 108 (90 Base) MCG/ACT inhaler and bring with you, Tiotropium Bromide-Olodaterol 2.5-2.5 MCG/ACT AERS  ____ Stop metformin 2 days prior to surgery    ____ Take 1/2 of usual insulin dose the night before surgery. No insulin the morning          of surgery.   __x__ Stop aspirin today  ____ Stop Anti-inflammatories on    __x__ Stop supplements until after surgery.  Alpha-Lipoic Acid 600 MG CAPS  ____ Bring C-Pap to the hospital.

## 2020-06-22 ENCOUNTER — Other Ambulatory Visit
Admission: RE | Admit: 2020-06-22 | Discharge: 2020-06-22 | Disposition: A | Discharge status: Home / Self Care | Payer: Medicare HMO | Source: Ambulatory Visit | Attending: Pulmonary Disease | Admitting: Pulmonary Disease

## 2020-06-22 ENCOUNTER — Encounter: Payer: Self-pay | Admitting: Urology

## 2020-06-22 ENCOUNTER — Other Ambulatory Visit: Payer: Medicare HMO

## 2020-06-22 ENCOUNTER — Ambulatory Visit (INDEPENDENT_AMBULATORY_CARE_PROVIDER_SITE_OTHER): Payer: Medicare HMO | Admitting: Urology

## 2020-06-22 ENCOUNTER — Other Ambulatory Visit: Payer: Self-pay

## 2020-06-22 VITALS — BP 145/57 | HR 58 | Ht 68.0 in | Wt 230.0 lb

## 2020-06-22 DIAGNOSIS — N138 Other obstructive and reflux uropathy: Secondary | ICD-10-CM

## 2020-06-22 DIAGNOSIS — N401 Enlarged prostate with lower urinary tract symptoms: Secondary | ICD-10-CM | POA: Diagnosis not present

## 2020-06-22 DIAGNOSIS — Z01812 Encounter for preprocedural laboratory examination: Secondary | ICD-10-CM | POA: Insufficient documentation

## 2020-06-22 DIAGNOSIS — Z20822 Contact with and (suspected) exposure to covid-19: Secondary | ICD-10-CM | POA: Diagnosis not present

## 2020-06-22 LAB — SARS CORONAVIRUS 2 (TAT 6-24 HRS): SARS Coronavirus 2: NEGATIVE

## 2020-06-22 NOTE — Progress Notes (Signed)
   06/22/20  CC:  Chief Complaint  Patient presents with  . Cysto    HPI: Severe LUTS with incomplete bladder emptying  Blood pressure (!) 145/57, pulse (!) 58, height 5\' 8"  (1.727 m), weight 230 lb (104.3 kg). NED. A&Ox3.   No respiratory distress   Abd soft, NT, ND Normal phallus with bilateral descended testicles  Cystoscopy Procedure Note  Patient identification was confirmed, informed consent was obtained, and patient was prepped using Betadine solution.  Lidocaine jelly was administered per urethral meatus.     Pre-Procedure: - Inspection reveals a normal caliber urethral meatus.  Procedure: The flexible cystoscope was introduced without difficulty - No urethral strictures/lesions are present. - Moderate lateral lobe enlargement prostate  - Mild elevation bladder neck - Bilateral ureteral orifices identified - Bladder mucosa  reveals no ulcers, tumors, or lesions - No bladder stones -Trabeculated bladder with cellules  Retroflexion shows no intravesical median lobe   Post-Procedure: - Patient tolerated the procedure well   Prostate transrectal ultrasound sizing   Informed consent was obtained after discussing risks/benefits of the procedure.  A time out was performed to ensure correct patient identity.   Pre-Procedure: -Transrectal probe was placed without difficulty -Transrectal Ultrasound performed revealing a 31 gm prostate measuring 3.08 x 4.57 x 4.22 cm (length) -No significant hypoechoic or median lobe noted    Assessment/ Plan:  1.  BPH with incomplete bladder emptying  He is a UroLift candidate.  The procedure was discussed.  The advantages of no hospitalization and local anesthesia/conscious sedation were reviewed.  The low complication rate was also discussed.  We also reviewed there is no guarantee the procedure will improve symptoms or his bladder emptying due to an element of bladder hypertonicity.    We also discussed other outlet  procedures including TURP and PVP.  If considering these procedures would recommend a preop urodynamic study  He would like to think over these options and stated he would call back if he desires to schedule    Abbie Sons, MD

## 2020-06-23 LAB — URINALYSIS, COMPLETE
Bilirubin, UA: NEGATIVE
Glucose, UA: NEGATIVE
Ketones, UA: NEGATIVE
Nitrite, UA: NEGATIVE
RBC, UA: NEGATIVE
Specific Gravity, UA: 1.025 (ref 1.005–1.030)
Urobilinogen, Ur: 0.2 mg/dL (ref 0.2–1.0)
pH, UA: 5.5 (ref 5.0–7.5)

## 2020-06-23 LAB — MICROSCOPIC EXAMINATION: Bacteria, UA: NONE SEEN

## 2020-06-26 ENCOUNTER — Encounter
Admission: RE | Disposition: A | Discharge status: Home / Self Care | Payer: Self-pay | Source: Home / Self Care | Attending: Pulmonary Disease

## 2020-06-26 ENCOUNTER — Ambulatory Visit
Admission: RE | Admit: 2020-06-26 | Discharge: 2020-06-26 | Disposition: A | Discharge status: Home / Self Care | Payer: Medicare HMO | Attending: Pulmonary Disease | Admitting: Pulmonary Disease

## 2020-06-26 ENCOUNTER — Other Ambulatory Visit: Payer: Self-pay

## 2020-06-26 ENCOUNTER — Encounter: Payer: Self-pay | Admitting: Pulmonary Disease

## 2020-06-26 ENCOUNTER — Ambulatory Visit: Payer: Medicare HMO

## 2020-06-26 ENCOUNTER — Ambulatory Visit: Admit: 2020-06-26 | Payer: Medicare HMO

## 2020-06-26 ENCOUNTER — Ambulatory Visit: Payer: Medicare HMO | Admitting: Anesthesiology

## 2020-06-26 DIAGNOSIS — Z79899 Other long term (current) drug therapy: Secondary | ICD-10-CM | POA: Insufficient documentation

## 2020-06-26 DIAGNOSIS — Z9889 Other specified postprocedural states: Secondary | ICD-10-CM

## 2020-06-26 DIAGNOSIS — J449 Chronic obstructive pulmonary disease, unspecified: Secondary | ICD-10-CM | POA: Diagnosis not present

## 2020-06-26 DIAGNOSIS — K219 Gastro-esophageal reflux disease without esophagitis: Secondary | ICD-10-CM | POA: Insufficient documentation

## 2020-06-26 DIAGNOSIS — I700 Atherosclerosis of aorta: Secondary | ICD-10-CM | POA: Diagnosis not present

## 2020-06-26 DIAGNOSIS — R911 Solitary pulmonary nodule: Secondary | ICD-10-CM | POA: Diagnosis not present

## 2020-06-26 DIAGNOSIS — Z87891 Personal history of nicotine dependence: Secondary | ICD-10-CM | POA: Diagnosis not present

## 2020-06-26 DIAGNOSIS — F418 Other specified anxiety disorders: Secondary | ICD-10-CM | POA: Insufficient documentation

## 2020-06-26 DIAGNOSIS — E119 Type 2 diabetes mellitus without complications: Secondary | ICD-10-CM | POA: Diagnosis not present

## 2020-06-26 DIAGNOSIS — F4310 Post-traumatic stress disorder, unspecified: Secondary | ICD-10-CM | POA: Diagnosis not present

## 2020-06-26 DIAGNOSIS — R918 Other nonspecific abnormal finding of lung field: Secondary | ICD-10-CM | POA: Diagnosis not present

## 2020-06-26 DIAGNOSIS — Z7982 Long term (current) use of aspirin: Secondary | ICD-10-CM | POA: Diagnosis not present

## 2020-06-26 DIAGNOSIS — J439 Emphysema, unspecified: Secondary | ICD-10-CM | POA: Diagnosis not present

## 2020-06-26 DIAGNOSIS — M1990 Unspecified osteoarthritis, unspecified site: Secondary | ICD-10-CM | POA: Insufficient documentation

## 2020-06-26 DIAGNOSIS — C9110 Chronic lymphocytic leukemia of B-cell type not having achieved remission: Secondary | ICD-10-CM | POA: Insufficient documentation

## 2020-06-26 DIAGNOSIS — I10 Essential (primary) hypertension: Secondary | ICD-10-CM | POA: Diagnosis not present

## 2020-06-26 DIAGNOSIS — E785 Hyperlipidemia, unspecified: Secondary | ICD-10-CM | POA: Insufficient documentation

## 2020-06-26 DIAGNOSIS — A6001 Herpesviral infection of penis: Secondary | ICD-10-CM | POA: Diagnosis not present

## 2020-06-26 HISTORY — PX: VIDEO BRONCHOSCOPY WITH ENDOBRONCHIAL NAVIGATION: SHX6175

## 2020-06-26 LAB — GLUCOSE, CAPILLARY
Glucose-Capillary: 85 mg/dL (ref 70–99)
Glucose-Capillary: 94 mg/dL (ref 70–99)

## 2020-06-26 SURGERY — VIDEO BRONCHOSCOPY WITH ENDOBRONCHIAL NAVIGATION
Anesthesia: General

## 2020-06-26 SURGERY — VIDEO BRONCHOSCOPY WITH ENDOBRONCHIAL NAVIGATION
Anesthesia: General | Laterality: Left

## 2020-06-26 MED ORDER — PROPOFOL 10 MG/ML IV BOLUS
INTRAVENOUS | Status: AC
  Filled 2020-06-26: qty 20

## 2020-06-26 MED ORDER — PROPOFOL 10 MG/ML IV BOLUS
INTRAVENOUS | Status: DC | PRN
  Administered 2020-06-26: 140 mg via INTRAVENOUS

## 2020-06-26 MED ORDER — PROPOFOL 500 MG/50ML IV EMUL
INTRAVENOUS | Status: DC | PRN
  Administered 2020-06-26: 100 ug/kg/min via INTRAVENOUS

## 2020-06-26 MED ORDER — LIDOCAINE HCL (CARDIAC) PF 100 MG/5ML IV SOSY
PREFILLED_SYRINGE | INTRAVENOUS | Status: DC | PRN
  Administered 2020-06-26: 80 mg via INTRAVENOUS

## 2020-06-26 MED ORDER — CHLORHEXIDINE GLUCONATE 0.12 % MT SOLN: 15.0000 mL | Freq: Once | OROMUCOSAL | Status: AC

## 2020-06-26 MED ORDER — FENTANYL CITRATE (PF) 100 MCG/2ML IJ SOLN
INTRAMUSCULAR | Status: AC
  Filled 2020-06-26: qty 2

## 2020-06-26 MED ORDER — LACTATED RINGERS IV SOLN: INTRAVENOUS | Status: DC

## 2020-06-26 MED ORDER — FENTANYL CITRATE (PF) 100 MCG/2ML IJ SOLN
INTRAMUSCULAR | Status: DC | PRN
  Administered 2020-06-26 (×2): 25 ug via INTRAVENOUS

## 2020-06-26 MED ORDER — ACETAMINOPHEN 10 MG/ML IV SOLN: 1000.0000 mg | Freq: Once | INTRAVENOUS | Status: DC | PRN

## 2020-06-26 MED ORDER — FENTANYL CITRATE (PF) 100 MCG/2ML IJ SOLN: 25.0000 ug | INTRAMUSCULAR | Status: DC | PRN

## 2020-06-26 MED ORDER — OXYCODONE HCL 5 MG PO TABS: 5.0000 mg | ORAL_TABLET | Freq: Once | ORAL | Status: DC | PRN

## 2020-06-26 MED ORDER — PHENYLEPHRINE HCL (PRESSORS) 10 MG/ML IV SOLN
INTRAVENOUS | Status: DC | PRN
  Administered 2020-06-26 (×2): 100 ug via INTRAVENOUS

## 2020-06-26 MED ORDER — SUGAMMADEX SODIUM 200 MG/2ML IV SOLN
INTRAVENOUS | Status: DC | PRN
  Administered 2020-06-26: 200 mg via INTRAVENOUS

## 2020-06-26 MED ORDER — BUTAMBEN-TETRACAINE-BENZOCAINE 2-2-14 % EX AERO
1.0000 | INHALATION_SPRAY | Freq: Once | CUTANEOUS | Status: DC
  Filled 2020-06-26: qty 20

## 2020-06-26 MED ORDER — LIDOCAINE HCL 4 % EX SOLN
CUTANEOUS | Status: DC | PRN
Start: 2020-06-26 — End: 2020-06-26
  Administered 2020-06-26: 4 mL via TOPICAL

## 2020-06-26 MED ORDER — SODIUM CHLORIDE 0.9 % IV SOLN: Freq: Once | INTRAVENOUS | Status: DC

## 2020-06-26 MED ORDER — OXYCODONE HCL 5 MG/5ML PO SOLN: 5.0000 mg | Freq: Once | ORAL | Status: DC | PRN

## 2020-06-26 MED ORDER — ORAL CARE MOUTH RINSE: 15.0000 mL | Freq: Once | OROMUCOSAL | Status: AC

## 2020-06-26 MED ORDER — ONDANSETRON HCL 4 MG/2ML IJ SOLN: 4.0000 mg | Freq: Once | INTRAMUSCULAR | Status: DC | PRN

## 2020-06-26 MED ORDER — LACTATED RINGERS IV SOLN: INTRAVENOUS | Status: DC | PRN

## 2020-06-26 MED ORDER — CHLORHEXIDINE GLUCONATE 0.12 % MT SOLN
OROMUCOSAL | Status: AC
  Administered 2020-06-26: 15 mL via OROMUCOSAL
  Filled 2020-06-26: qty 15

## 2020-06-26 MED ORDER — ROCURONIUM BROMIDE 100 MG/10ML IV SOLN
INTRAVENOUS | Status: DC | PRN
  Administered 2020-06-26: 40 mg via INTRAVENOUS

## 2020-06-26 NOTE — Anesthesia Preprocedure Evaluation (Signed)
Anesthesia Evaluation  Patient identified by MRN, date of birth, ID band Patient awake    Reviewed: Allergy & Precautions, NPO status , Patient's Chart, lab work & pertinent test results  History of Anesthesia Complications Negative for: history of anesthetic complications  Airway Mallampati: II  TM Distance: >3 FB Neck ROM: Full    Dental  (+) Edentulous Upper, Edentulous Lower   Pulmonary neg sleep apnea, neg pneumonia , COPD,  COPD inhaler, Patient abstained from smoking.Not current smoker, former smoker,    Pulmonary exam normal breath sounds clear to auscultation       Cardiovascular Exercise Tolerance: Good METShypertension, + Peripheral Vascular Disease  (-) CAD and (-) Past MI negative cardio ROS Normal cardiovascular exam(-) dysrhythmias  Rhythm:Regular Rate:Normal - Systolic murmurs    Neuro/Psych PSYCHIATRIC DISORDERS Anxiety Depression TIA Neuromuscular disease CVA, No Residual Symptoms    GI/Hepatic GERD  Medicated and Controlled,(+)     (-) substance abuse  ,   Endo/Other  neg diabetes  Renal/GU negative Renal ROS     Musculoskeletal  (+) Arthritis , Osteoarthritis,    Abdominal Normal abdominal exam  (+)   Peds negative pediatric ROS (+)  Hematology  (+) anemia ,   Anesthesia Other Findings Past Medical History: No date: Anemia No date: Arthritis No date: BPH (benign prostatic hyperplasia) No date: Cancer (HCC)     Comment:  Lung No date: COPD (chronic obstructive pulmonary disease) (HCC) No date: Emphysema of lung (HCC) No date: Emphysema of lung (HCC) No date: Esophageal reflux No date: Hyperlipidemia No date: Hypertension No date: Internal carotid artery stenosis, right No date: Neuropathy of both feet No date: Pneumonia     Comment:  recurrent No date: PTSD (post-traumatic stress disorder)     Comment:  [place] vet No date: Sebaceous cyst No date: SOB (shortness of breath) on  exertion No date: TIA (transient ischemic attack) No date: Urge incontinence  Reproductive/Obstetrics                             Anesthesia Physical  Anesthesia Plan  ASA: III  Anesthesia Plan: General   Post-op Pain Management:    Induction: Intravenous  PONV Risk Score and Plan: 2 and Ondansetron and Dexamethasone  Airway Management Planned: Oral ETT  Additional Equipment: None  Intra-op Plan:   Post-operative Plan: Extubation in OR  Informed Consent: I have reviewed the patients History and Physical, chart, labs and discussed the procedure including the risks, benefits and alternatives for the proposed anesthesia with the patient or authorized representative who has indicated his/her understanding and acceptance.     Dental advisory given  Plan Discussed with: CRNA and Surgeon  Anesthesia Plan Comments: (Discussed risks of anesthesia with patient, including PONV, sore throat, lip/dental damage. Rare risks discussed as well, such as cardiorespiratory and neurological sequelae. Patient understands.)        Anesthesia Quick Evaluation

## 2020-06-26 NOTE — Interval H&P Note (Signed)
History and Physical Interval Note:  06/26/2020 12:51 PM  Donald [name]  has presented today for surgery, with the diagnosis of LEFT LUNG MASS.  The various methods of treatment have been discussed with the patient and family. After consideration of risks, benefits and other options for treatment, the patient has consented to  Procedure(s): [place] (Left) as a surgical intervention.  The patient's history has been reviewed, patient examined, no change in status, stable for surgery.  I have reviewed the patient's chart and labs.  Questions were answered to the patient's satisfaction.     Vernard Gambles

## 2020-06-26 NOTE — Discharge Instructions (Signed)
AMBULATORY SURGERY  DISCHARGE INSTRUCTIONS   1) The drugs that you were given will stay in your system until tomorrow so for the next 24 hours you should not:  A) Drive an automobile B) Make any legal decisions C) Drink any alcoholic beverage   2) You may resume regular meals tomorrow.  Today it is better to start with liquids and gradually work up to solid foods.  You may eat anything you prefer, but it is better to start with liquids, then soup and crackers, and gradually work up to solid foods.   3) Please notify your doctor immediately if you have any unusual bleeding, trouble breathing, redness and pain at the surgery site, drainage, fever, or pain not relieved by medication.    4) Additional Instructions:        Please contact your physician with any problems or Same Day Surgery at [phone], Monday through Friday 6 am to 4 pm, or [place] at [facility] number at [phone].Arrived in PACU, no RN to receive patient available at 1452 VSS 152/52 p 59 rr 20  Temp 96.3

## 2020-06-26 NOTE — Transfer of Care (Signed)
Immediate Anesthesia Transfer of Care Note  Patient: Donald [name]  Procedure(s) Performed: VIDEO BRONCHOSCOPY WITH ENDOBRONCHIAL NAVIGATION (Left )  Patient Location: PACU  Anesthesia Type:General  Level of Consciousness: awake and oriented  Airway & Oxygen Therapy: Patient Spontanous Breathing and Patient connected to face mask oxygen  Post-op Assessment: Report given to RN and Post -op Vital signs reviewed and stable  Post vital signs: Reviewed and stable  Last Vitals:  Vitals Value Taken Time  BP    Temp    Pulse    Resp    SpO2      Last Pain:  Vitals:   06/26/20 1214  TempSrc: Oral  PainSc: 0-No pain         Complications: No complications documented.

## 2020-06-26 NOTE — Op Note (Signed)
Electromagnetic Navigation Bronchoscopy: Indication: Enlarging lung mass/nodule LEFT upper lobe  Preoperative Diagnosis: Enlarging left lung nodule/mass LEFT upper lobe Post Procedure Diagnosis: Same as above  Consent: Verbal/Written  Benefits, limitations and potential complications of the procedure were discussed with the patient/family  including, but not limited to bleeding, hemoptysis, respiratory failure requiring intubation and/or prolongued mechanical ventilation, infection, pneumothorax (collapse of lung) requiring chest tube placement, stroke from air embolism or even death.  Patient agreed to proceed.  Surgeon: Renold Don, MD Assistant/Scrub: Annia Belt, RRT Anesthesiologist/CRNA: Arita Miss, MD/Kimberly Altamese [place], CRNA ROSE available?:  Yes, [facility] C arm fluoroscopy available [employer] Endomicroscopy: Unavailable  Type of Anesthesia: General endotracheal  Procedure Performed:  Virtual Bronchoscopy with Multi-planar Image analysis, 3-D reconstruction of coronal, sagittal and multi-planar images for the purposes of planning real-time bronchoscopy using the iLogic Electromagnetic Navigation Bronchoscopy System (superDimension).  Description of Procedure: The patient was brought to Procedure Room 2 (Bronchoscopy Suite) where appropriate timeout was taken with the staff.  Patient's name, date of birth, identifiers and laterality were identified.  The correct procedure was also identified.  Patient was inducted under general anesthesia by the anesthesia team.  He was intubated with an 8.5 ET tube without difficulty.  A Portex adapter was placed on the flange of the ET tube.  Once the patient had reached adequate general anesthesia the bronchoscope was advanced through the existing Portex adapter into the ET tube.  The Olympus video therapeutic scope was then introduced into the airway and an anatomic tour of the tracheobronchial tree was performed.  The visible trachea was normal.   Carina was sharp.  The right bronchial tree was examined this had no endobronchial lesions.  There were copious thick secretions that were lavaged till clear.  In similar fashion the left bronchial tree was then examined and again no endobronchial lesions were noted.  Copious thick secretions were noted that were suctioned till clear.  Secretions were nonpurulent.  At this point a superDimension extended working channel catheter and locatable guide (LG) was introduced into the working channel of the bronchoscope. The LG was then placed into the central portion of the trachea.The LG was then  directed to standard registration points at the following centers: main carina, right upper lobe bronchus, right lower lobe bronchus, right middle lobe bronchus, left upper lobe bronchus, and the left lower lobe bronchus. This data was transferred to the i-Logic ENB system for real-time bronchoscopy.  The bronchoscope was then navigated to the left upper lobe to the target for tissue sampling.  And 180 degree catheter was used, the lesion was in the most apical subsegment of the left upper lobe and navigation was difficult due to location.  The target was acquired and is bronchial brushings were obtained.  Initial transbronchial brushings showed only reactive bronchial cells by ROSE.  Additional brushings were obtained after the catheter was reoriented.  Reorientation had to be done several times during the procedure due to the catheter slipping out of the subsegment given the location of the lesion.  All sampling was obtained utilizing fluoroscopy as guidance.  In similar fashion as bronchial biopsies were obtained x8.  All material was placed into formalin for analysis.  Transbronchial needle aspirations were performed x3 under fluoroscopic guidance.  Once sampling was completed targeted bronchoalveolar lavage was performed instilling 40 mL of saline obtaining a 8 mL of aliquot.  Once all of this was completed the airway was  inspected and good hemostasis was observed.  The patient received a  lidocaine 1% via bronchial lavage for a total of 9 mL.  The bronchoscope was then retrieved and the procedure was terminated.  Patient was allowed to emerge from general anesthesia and was extubated in the procedure room.  He was taken to the PACU in satisfactory condition.  Chest x-ray postprocedure showed no pneumothorax.  The patient had an uneventful recovery in the PACU.   Specimens Obtained:  Transbronchial Fine Needle Aspirations 21G X3  Transbronchial Forceps Biopsy  X8  Transbronchial  Brush: X6  BAL 40 ml in 8 ml out  Fluoroscopy:  Fluoroscopy was utilized during the course of this procedure to assure that biopsies were taken in a safe manner under fluoroscopic guidance with spot films required.   Complications:None, postprocedure chest x-ray showed no pneumothorax.  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 Derrill Kay, MD [facility]   *This note was dictated using voice recognition software/Dragon.  Despite best efforts to proofread, errors can occur which can change the meaning.  Any change was purely unintentional.

## 2020-06-26 NOTE — Anesthesia Procedure Notes (Signed)
Procedure Name: Intubation Date/Time: 06/26/2020 1:25 PM Performed by: Allean Found, CRNA Pre-anesthesia Checklist: Patient identified, Patient being monitored, Timeout performed, Emergency Drugs available and Suction available Patient Re-evaluated:Patient Re-evaluated prior to induction Oxygen Delivery Method: Circle system utilized Preoxygenation: Pre-oxygenation with 100% oxygen Induction Type: IV induction Ventilation: Mask ventilation without difficulty Laryngoscope Size: McGraph and 4 Grade View: Grade I Tube type: Oral Tube size: 8.5 mm Number of attempts: 1 Airway Equipment and Method: Stylet and LTA kit utilized Placement Confirmation: ETT inserted through vocal cords under direct vision,  positive ETCO2 and breath sounds checked- equal and bilateral Secured at: 21 cm Tube secured with: Tape Dental Injury: Teeth and Oropharynx as per pre-operative assessment

## 2020-06-26 NOTE — Brief Op Note (Signed)
06/26/2020  5:27 PM  PATIENT:  Donald [name]  86 y.o. male  PRE-OPERATIVE DIAGNOSIS:  LEFT LUNG MASS/NODULE  POST-OPERATIVE DIAGNOSIS: Same as above, status post biopsy  PROCEDURE:  Procedure(s): VIDEO BRONCHOSCOPY WITH ENDOBRONCHIAL NAVIGATION (Left)  SURGEON:  Surgeon(s) and Role:    Tyler Pita, MD - Primary  PHYSICIAN ASSISTANT:   ASSISTANTS: Annia Belt, RRT  ANESTHESIA:   general  EBL: Less than 2 mL  BLOOD ADMINISTERED:none  DRAINS: none   LOCAL MEDICATIONS USED:  LIDOCAINE   SPECIMEN:  Source of Specimen:  Brushings, Biopsy / Limited Resection, Fine Needle Aspirate and Lavage/Washing  DISPOSITION OF SPECIMEN:  PATHOLOGY  COUNTS:  N/A  TOURNIQUET:  N/A  DICTATION: .Dragon Dictation  PLAN OF CARE: Discharge to home after PACU  PATIENT DISPOSITION:  PACU - hemodynamically stable.   Delay start of Pharmacological VTE agent (>24hrs) due to surgical blood loss or risk of bleeding: not applicable

## 2020-06-27 ENCOUNTER — Encounter: Payer: Self-pay | Admitting: Pulmonary Disease

## 2020-06-27 LAB — SURGICAL PATHOLOGY

## 2020-06-27 LAB — CYTOLOGY - NON PAP

## 2020-06-27 NOTE — Anesthesia Postprocedure Evaluation (Signed)
Anesthesia Post Note  Patient: Donald [name]  Procedure(s) Performed: VIDEO BRONCHOSCOPY WITH ENDOBRONCHIAL NAVIGATION (Left )  Patient location during evaluation: PACU Anesthesia Type: General Level of consciousness: awake and alert Pain management: pain level controlled Vital Signs Assessment: post-procedure vital signs reviewed and stable Respiratory status: spontaneous breathing, nonlabored ventilation, respiratory function stable and patient connected to nasal cannula oxygen Cardiovascular status: blood pressure returned to baseline and stable Postop Assessment: no apparent nausea or vomiting Anesthetic complications: no   No complications documented.   Last Vitals:  Vitals:   06/26/20 1553 06/26/20 1614  BP: (!) 161/51 (!) 152/64  Pulse: (!) 51 66  Resp:  18  Temp: (!) 36.1 C   SpO2: 99% 96%    Last Pain:  Vitals:   06/26/20 1615  TempSrc:   PainSc: (P) 0-No pain                 Arita Miss
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
## 2020-06-30 ENCOUNTER — Encounter: Payer: Self-pay | Admitting: Oncology

## 2020-06-30 DIAGNOSIS — L570 Actinic keratosis: Secondary | ICD-10-CM | POA: Diagnosis not present

## 2020-06-30 DIAGNOSIS — D2261 Melanocytic nevi of right upper limb, including shoulder: Secondary | ICD-10-CM | POA: Diagnosis not present

## 2020-06-30 DIAGNOSIS — D2272 Melanocytic nevi of left lower limb, including hip: Secondary | ICD-10-CM | POA: Diagnosis not present

## 2020-06-30 DIAGNOSIS — D2271 Melanocytic nevi of right lower limb, including hip: Secondary | ICD-10-CM | POA: Diagnosis not present

## 2020-06-30 DIAGNOSIS — D225 Melanocytic nevi of trunk: Secondary | ICD-10-CM | POA: Diagnosis not present

## 2020-06-30 DIAGNOSIS — L820 Inflamed seborrheic keratosis: Secondary | ICD-10-CM | POA: Diagnosis not present

## 2020-06-30 DIAGNOSIS — D2262 Melanocytic nevi of left upper limb, including shoulder: Secondary | ICD-10-CM | POA: Diagnosis not present

## 2020-06-30 DIAGNOSIS — D485 Neoplasm of uncertain behavior of skin: Secondary | ICD-10-CM | POA: Diagnosis not present

## 2020-06-30 DIAGNOSIS — L821 Other seborrheic keratosis: Secondary | ICD-10-CM | POA: Diagnosis not present

## 2020-06-30 NOTE — Progress Notes (Signed)
Patient called for pre assessment. He denies any pain or concerns at this time.

## 2020-07-03 ENCOUNTER — Encounter: Payer: Self-pay | Admitting: *Deleted

## 2020-07-03 ENCOUNTER — Inpatient Hospital Stay: Payer: Medicare HMO | Attending: Oncology | Admitting: Oncology

## 2020-07-03 ENCOUNTER — Other Ambulatory Visit: Payer: Self-pay

## 2020-07-03 VITALS — BP 129/45 | HR 56 | Temp 98.6°F | Resp 19 | Wt 229.0 lb

## 2020-07-03 DIAGNOSIS — C9110 Chronic lymphocytic leukemia of B-cell type not having achieved remission: Secondary | ICD-10-CM

## 2020-07-03 DIAGNOSIS — R911 Solitary pulmonary nodule: Secondary | ICD-10-CM | POA: Diagnosis not present

## 2020-07-03 NOTE — Progress Notes (Signed)
  Oncology Nurse Navigator Documentation  Navigator Location: [facility] (07/03/20 1100)   )Navigator Encounter Type: Appt/Treatment Plan Review (07/03/20 1100)                     Patient Visit Type: MedOnc (07/03/20 1100) Treatment Phase: Abnormal Scans (07/03/20 1100) Barriers/Navigation Needs: Coordination of Care (07/03/20 1100)   Interventions: Coordination of Care (07/03/20 1100)   Coordination of Care: Appts (07/03/20 1100)         Appt/Treatment plan review. Will follow up with patient and next clinic visit on 07/12/20 with Dr. Baruch Gouty.         Time Spent with Patient: 30 (07/03/20 1100)

## 2020-07-03 NOTE — Progress Notes (Signed)
Follow up visit this . Offers no complaints.

## 2020-07-11 ENCOUNTER — Ambulatory Visit
Admission: RE | Admit: 2020-07-11 | Discharge: 2020-07-11 | Disposition: A | Discharge status: Home / Self Care | Payer: Medicare HMO | Source: Ambulatory Visit | Attending: Oncology | Admitting: Oncology

## 2020-07-11 ENCOUNTER — Other Ambulatory Visit: Payer: Self-pay

## 2020-07-11 ENCOUNTER — Encounter: Payer: Self-pay | Admitting: Radiation Oncology

## 2020-07-11 DIAGNOSIS — K5730 Diverticulosis of large intestine without perforation or abscess without bleeding: Secondary | ICD-10-CM | POA: Insufficient documentation

## 2020-07-11 DIAGNOSIS — R918 Other nonspecific abnormal finding of lung field: Secondary | ICD-10-CM | POA: Diagnosis not present

## 2020-07-11 DIAGNOSIS — I2510 Atherosclerotic heart disease of native coronary artery without angina pectoris: Secondary | ICD-10-CM | POA: Diagnosis not present

## 2020-07-11 DIAGNOSIS — I517 Cardiomegaly: Secondary | ICD-10-CM | POA: Insufficient documentation

## 2020-07-11 DIAGNOSIS — N400 Enlarged prostate without lower urinary tract symptoms: Secondary | ICD-10-CM | POA: Insufficient documentation

## 2020-07-11 DIAGNOSIS — K449 Diaphragmatic hernia without obstruction or gangrene: Secondary | ICD-10-CM | POA: Insufficient documentation

## 2020-07-11 DIAGNOSIS — J432 Centrilobular emphysema: Secondary | ICD-10-CM | POA: Diagnosis not present

## 2020-07-11 DIAGNOSIS — R911 Solitary pulmonary nodule: Secondary | ICD-10-CM | POA: Diagnosis not present

## 2020-07-11 DIAGNOSIS — C9110 Chronic lymphocytic leukemia of B-cell type not having achieved remission: Secondary | ICD-10-CM | POA: Diagnosis not present

## 2020-07-11 DIAGNOSIS — I700 Atherosclerosis of aorta: Secondary | ICD-10-CM | POA: Insufficient documentation

## 2020-07-11 LAB — GLUCOSE, CAPILLARY: Glucose-Capillary: 104 mg/dL — ABNORMAL HIGH (ref 70–99)

## 2020-07-11 MED ORDER — FLUDEOXYGLUCOSE F - 18 (FDG) INJECTION
11.9000 | Freq: Once | INTRAVENOUS | Status: AC | PRN
  Administered 2020-07-11: 12.11 via INTRAVENOUS

## 2020-07-12 ENCOUNTER — Encounter: Payer: Self-pay | Admitting: *Deleted

## 2020-07-12 ENCOUNTER — Ambulatory Visit
Admission: RE | Admit: 2020-07-12 | Discharge: 2020-07-12 | Disposition: A | Discharge status: Home / Self Care | Payer: Medicare HMO | Source: Ambulatory Visit | Attending: Radiation Oncology | Admitting: Radiation Oncology

## 2020-07-12 VITALS — BP 114/44 | HR 72 | Temp 96.6°F | Wt 226.0 lb

## 2020-07-12 DIAGNOSIS — R911 Solitary pulmonary nodule: Secondary | ICD-10-CM | POA: Diagnosis not present

## 2020-07-12 DIAGNOSIS — Z0100 Encounter for examination of eyes and vision without abnormal findings: Secondary | ICD-10-CM | POA: Diagnosis not present

## 2020-07-12 LAB — HM DIABETES EYE EXAM

## 2020-07-12 NOTE — Progress Notes (Signed)
  Oncology Nurse Navigator Documentation  Navigator Location: [facility] (07/12/20 1300)   )Navigator Encounter Type: Initial RadOnc (07/12/20 1300)   Abnormal Finding Date: 07/11/20 (07/12/20 1300)                   Treatment Phase: Pre-Tx/Tx Discussion (07/12/20 1300) Barriers/Navigation Needs: Coordination of Care (07/12/20 1300)   Interventions: Coordination of Care (07/12/20 1300)   Coordination of Care: Appts (07/12/20 1300)           met pt during initial radonc consultation with Dr. Baruch Gouty. All questions answered during visit. Reviewed upcoming appts. Contact info given and instructed to call with any further questions or needs. Pt verbalized understanding.        Time Spent with Patient: 30 (07/12/20 1300)

## 2020-07-12 NOTE — Consult Note (Signed)
NEW PATIENT EVALUATION  Name: Donald [name]  MRN: [id number]  Date:   07/12/2020     DOB: 05/03/1933   This 86 y.o. male patient presents to the clinic for initial evaluation of left upper lobe stage I non-small cell lung cancer in patient with known CLL.  REFERRING PHYSICIAN: McLean-Scocuzza, Olivia Mackie *  CHIEF COMPLAINT:  Chief Complaint  Patient presents with  . Lung Cancer    Initial consultation    DIAGNOSIS: The encounter diagnosis was Left upper lobe pulmonary nodule.   PREVIOUS INVESTIGATIONS:  PET/CT and CT scans reviewed Pathology report reviewed Clinical notes reviewed  HPI: Patient is an 86 year old male with likely CLL.  He has lymphadenopathy in both his mediastinum and bilateral axilla retroperitoneal iliac and inguinal nodes.  His PET/CT is compatible with a chronic low-grade lymphoproliferative disorder.  His cytometry panels are highly suspicious for CLL/SLL.  He was found to have a pulmonary nodule which has been followed is hypermetabolic on PET CT scan from September 2020.  He has had navigational biopsy x2 which is inconclusive.  CT scan showed progression of lesion.  Most recent PET CT scan again showed hypermetabolic activity in a 0.8 cm left upper lobe pulmonary nodule.  He does have hypermetabolic left hilar left paratracheal nodes although this all can be related to his CLL.  He has no evidence to suggest S extrathoracic disease.  Patient is asymptomatic specifically [place] cough hemoptysis chest tightness.  His case was presented at tumor conference he is seen today for evaluation for possible SBRT.  PLANNED TREATMENT REGIMEN: SBRT  PAST MEDICAL HISTORY:  has a past medical history of Anemia, Arthritis, BPH (benign prostatic hyperplasia), Cancer ([place]), COPD (chronic obstructive pulmonary disease) ([place]), Emphysema of lung ([place]), Emphysema of lung ([place]), Esophageal reflux, Hyperlipidemia, Hypertension, Internal carotid artery stenosis, right, Neuropathy of both  feet, Pneumonia, PTSD (post-traumatic stress disorder), Sebaceous cyst, SOB (shortness of breath) on exertion, TIA (transient ischemic attack), and Urge incontinence.    PAST SURGICAL HISTORY:  Past Surgical History:  Procedure Laterality Date  . CYST EXCISION     buttocks  . ELECTROMAGNETIC NAVIGATION BROCHOSCOPY Left 08/23/2019   Procedure: ELECTROMAGNETIC NAVIGATION BRONCHOSCOPY;  Surgeon: Tyler Pita, MD;  Location: [facility] ORS;  Service: Cardiopulmonary;  Laterality: Left;  . ENDOBRONCHIAL ULTRASOUND N/A 08/23/2019   Procedure: ENDOBRONCHIAL ULTRASOUND;  Surgeon: Tyler Pita, MD;  Location: [facility] ORS;  Service: Cardiopulmonary;  Laterality: N/A;  . HEMORRHOID SURGERY    . shave biopsy  03/13/2020   left neck 03/13/20 [facility] wart with atypica inflamed +AK no carcinoma   . SINUSOTOMY    . VIDEO BRONCHOSCOPY WITH ENDOBRONCHIAL NAVIGATION Left 06/26/2020   Procedure: VIDEO BRONCHOSCOPY WITH ENDOBRONCHIAL NAVIGATION;  Surgeon: Tyler Pita, MD;  Location: [facility] ORS;  Service: Pulmonary;  Laterality: Left;    FAMILY HISTORY: family history includes Cancer in his maternal aunt; Heart disease in his mother; Hypertension in his son; Leukemia in his maternal aunt; Lung cancer in his maternal aunt.  SOCIAL HISTORY:  reports that he quit smoking about 20 years ago. His smoking use included cigarettes. He has a 75.00 pack-year smoking history. He has never used smokeless tobacco. He reports that he does not drink alcohol and does not use drugs.  ALLERGIES: Cephalexin  MEDICATIONS:  Current Outpatient Medications  Medication Sig Dispense Refill  . albuterol (VENTOLIN HFA) 108 (90 Base) MCG/ACT inhaler Inhale 2 puffs into the lungs every 6 (six) hours as needed for wheezing or shortness  of breath. 18 g 6  . Alpha-Lipoic Acid 600 MG CAPS Take 1 capsule (600 mg total) by mouth in the morning and at bedtime. 180 capsule 3  . amLODipine (NORVASC) 5 MG tablet Take 1 tablet  (5 mg total) by mouth 2 (two) times daily. (Patient taking differently: Take 5 mg by mouth daily. ) 180 tablet 1  . aspirin EC 81 MG tablet Take 81 mg by mouth daily.     Marland Kitchen atorvastatin (LIPITOR) 80 MG tablet Take 40 mg by mouth daily.    . cetirizine (ZYRTEC) 10 MG tablet Take 1 tablet (10 mg total) by mouth daily. 30 tablet 11  . cholecalciferol (VITAMIN D) 1000 UNITS tablet Take 1,000 Units by mouth 2 (two) times daily.     . DULoxetine (CYMBALTA) 20 MG capsule Take 40 mg by mouth daily.    Marland Kitchen dutasteride (AVODART) 0.5 MG capsule Take 1 capsule (0.5 mg total) by mouth daily. 90 capsule 2  . famotidine (PEPCID) 20 MG tablet Take 1 tablet (20 mg total) by mouth daily as needed for heartburn or indigestion.    . gabapentin (NEURONTIN) 300 MG capsule Take 1 capsule (300 mg total) by mouth at bedtime. 90 capsule 3  . glucose blood test strip 1 each by Other route as needed. Use as instructed 100 each 1  . hydrochlorothiazide (HYDRODIURIL) 25 MG tablet Take 0.5 tablets (12.5 mg total) by mouth daily.    Marland Kitchen lisinopril (ZESTRIL) 10 MG tablet Take 1 tablet (10 mg total) by mouth daily. 90 tablet 3  . Multiple Vitamins-Minerals (MEGA MULTIVITAMIN FOR MEN) TABS Take 1 tablet by mouth daily.     Marland Kitchen omeprazole (PRILOSEC) 40 MG capsule Take 1 capsule (40 mg total) by mouth daily.    . tamsulosin (FLOMAX) 0.4 MG CAPS capsule Take 1 capsule (0.4 mg total) by mouth daily. 90 capsule 2  . Tiotropium Bromide-Olodaterol 2.5-2.5 MCG/ACT AERS Inhale 2 puffs into the lungs daily.    . valACYclovir (VALTREX) 500 MG tablet Take 1 tablet (500 mg total) by mouth every other day. 15 tablet 3   No current facility-administered medications for this encounter.    ECOG PERFORMANCE STATUS:  0 - Asymptomatic  REVIEW OF SYSTEMS: Patient denies any weight loss, fatigue, weakness, fever, chills or night sweats. Patient denies any loss of vision, blurred vision. Patient denies any ringing  of the ears or hearing loss. No  irregular heartbeat. Patient denies heart murmur or history of fainting. Patient denies any chest pain or pain radiating to her upper extremities. Patient denies any shortness of breath, difficulty breathing at night, cough or hemoptysis. Patient denies any swelling in the lower legs. Patient denies any nausea vomiting, vomiting of blood, or coffee ground material in the vomitus. Patient denies any stomach pain. Patient states has had normal bowel movements no significant constipation or diarrhea. Patient denies any dysuria, hematuria or significant nocturia. Patient denies any problems walking, swelling in the joints or loss of balance. Patient denies any skin changes, loss of hair or loss of weight. Patient denies any excessive worrying or anxiety or significant depression. Patient denies any problems with insomnia. Patient denies excessive thirst, polyuria, polydipsia. Patient denies any swollen glands, patient denies easy bruising or easy bleeding. Patient denies any recent infections, allergies or URI. Patient "s visual fields have not changed significantly in recent time.   PHYSICAL EXAM: BP (!) 114/44 (BP Location: Left Arm, Patient Position: Sitting)   Pulse 72   Temp (!) 96.6 F (  35.9 C) (Tympanic)   Wt 226 lb (102.5 kg)   SpO2 97%   BMI 34.36 kg/m  Well-developed well-nourished patient in NAD. HEENT reveals PERLA, EOMI, discs not visualized.  Oral cavity is clear. No oral mucosal lesions are identified. Neck is clear without evidence of cervical or supraclavicular adenopathy. Lungs are clear to A&P. Cardiac examination is essentially unremarkable with regular rate and rhythm without murmur rub or thrill. Abdomen is benign with no organomegaly or masses noted. Motor sensory and DTR levels are equal and symmetric in the upper and lower extremities. Cranial nerves II through XII are grossly intact. Proprioception is intact. No peripheral adenopathy or edema is identified. No motor or sensory levels  are noted. Crude visual fields are within normal range.  LABORATORY DATA: Previous cytology pathology report reviewed labs reviewed    RADIOLOGY RESULTS: Serial CT scan and PET CT scan reviewed compatible with above-stated findings   IMPRESSION: Highly probable stage I non-small cell lung cancer of the left upper lobe in 86 year old male with presumed CLL  PLAN: At this time and discounting his adenopathy in his mediastinal hilar area as well as other nodal sites as part of his CLL.  I believe he has a stage I non-small cell lung cancer left upper lobe.  I have offered SBRT 60 Gy in 5 fractions.  Side effect such as possible development of cough fatigue alteration of blood counts skin reaction all were discussed in detail with the patient.  He seems to comprehend my treatment plan well.  I have personally set up and ordered CT simulation for [facility] with 4-dimensional treatment planning as well as motion restriction.  Patient comprehends my recommendations well.  I would like to take this opportunity to thank you for allowing me to participate in the care of your patient.Noreene Filbert, MD

## 2020-07-18 ENCOUNTER — Telehealth: Payer: Self-pay | Admitting: *Deleted

## 2020-07-18 NOTE — Telephone Encounter (Signed)
Patient called want to know his out of pocket expence for his scheduled radiation treatment.

## 2020-07-20 ENCOUNTER — Telehealth: Payer: Self-pay | Admitting: *Deleted

## 2020-07-20 NOTE — Telephone Encounter (Signed)
Patient called to state that he needed to change his up coming appointment. He needs it moved at least 2 weeks out.

## 2020-07-24 ENCOUNTER — Ambulatory Visit: Payer: Medicare HMO

## 2020-08-01 ENCOUNTER — Ambulatory Visit: Payer: Medicare HMO

## 2020-08-03 ENCOUNTER — Encounter: Payer: Self-pay | Admitting: Adult Health

## 2020-08-03 ENCOUNTER — Other Ambulatory Visit: Payer: Self-pay

## 2020-08-03 ENCOUNTER — Ambulatory Visit: Payer: Medicare HMO | Admitting: Adult Health

## 2020-08-03 ENCOUNTER — Ambulatory Visit: Payer: Medicare HMO

## 2020-08-03 DIAGNOSIS — R918 Other nonspecific abnormal finding of lung field: Secondary | ICD-10-CM | POA: Diagnosis not present

## 2020-08-03 DIAGNOSIS — J449 Chronic obstructive pulmonary disease, unspecified: Secondary | ICD-10-CM | POA: Diagnosis not present

## 2020-08-03 NOTE — Progress Notes (Signed)
@Patient  ID: Donald [name], male    DOB: 11-May-1933, 86 y.o.   MRN: [id number]  Chief Complaint  Patient presents with  . Follow-up    COPD     Referring provider: McLean-Scocuzza, Olivia Mackie *  HPI: 86 year old male former smoker followed for COPD and lung nodule Left upper lobe lung nodule hypermetabolic on PET scan September 2020, navigational bronc with EBUS October 2020 nondiagnostic.  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 Multiple enlarged mediastinal lymph nodes, measuring up to 2.4 cm, of uncertain etiology. 4 mm LUL nodule and medial right basilar pulmonary nodule measures 1.3 cm 03/12/18 CT chest:Persistent mediastinal and axillary lymphadenopathy which is nonspecific however concerning for the possibility of lymphoma given persistent nature, size and distribution. Recommend clinical and laboratory correlation. Interval resolution of previously described right lower lobe nodule. Persistent to increased left lower lobe ground-glass and patchy consolidative opacities which may represent worsening infectious/inflammatory process PET 04/09/18: Mildly hypermetabolic right paratracheal, subcarinal, central mesenteric, right external iliac and left inguinal lymphadenopathy, suggestive of a lymphoproliferative disorder. The most FDG avid nodes are in the mediastinum (in particular, subcarinal) CT chest 07/21/18:Relatively stable adenopathy compatible with indolent lymphoproliferative condition. Mild bilateral axillary adenopathy has mildly increased. Mild mediastinal, mesenteric, retroperitoneal and bilateral pelvic adenopathy is stable PFTs 07/14/18: Very mild obstruction. Normal lung volumes. Mild decrease in DLCO. DLCO/VA low normal CT  chest 07/27/19: Mild centrilobular emphysema, apical left upper lobe nodule 1.0 x 0.8 cm, scattered groundglass opacities PET/CT 08/05/19: Left upper lobe nodule hyper metabolic SUV max 4.4.  Mediastinal adenopathy with low SUV and unchanged from prior Navigational bronchoscopy/EBUS 08/23/19: Left upper lobe nodule brushings mixed inflammatory cells of node TBNA benign CT chest 06/08/20: Increased size of dual now measuring 1.5 x 1.2 cm (previously 1.0 x 0.8 cm)  08/03/2020 Follow up ; COPD , Lung nodule  Patient returns for a 6-week follow-up.  Patient has had a left upper lobe pulmonary nodule followed over several years.  September 2020 there was a slight increase in the nodule measuring 1.0 x 0.8 cm.  PET scan showed  hypermetabolic activity.  He underwent a navigational bronchoscopy October 2020.  This showed no malignancy with inflammatory cells.  Follow-up CT chest July 2021 showed increased left upper lobe nodule size measuring 1.5 cm.  Patient underwent a repeat navigational bronchoscopy with biopsy unfortunately results were inconclusive again showing benign bronchial wall and apnea related lung parenchyma with no malignancy. PET scan was repeated showing further increase in size with 1.9 cm hypermetabolic apical left upper lobe pulmonary nodule.  Increased density and metabolism since September 2020 PET scan. 2 new clustered solid pulmonary nodules more centrally in the left upper lobe largest 0.8 cm with low-level uptake.  And new hypermetabolic left hilar and low left paratracheal lymph nodes suspicious for nodal metastasis.  And overall mildly increased hypermetabolic right peritracheal, subcarinal, left axillary, central mesenteric and right external iliac lymphadenopathy compatible with low-grade lymphoproliferative disorder.  Previous cytometry panels were highly suspicious for CLL/SLL.    Patient was referred to radiation oncology, patient has been recommended for SBRT  Patient says that  he is going to proceed with radiation however is going to have a hard time affording the copayment.  And is going to try to get this done at the [place].  He  says he has contacted the [place] at [facility].  And is trying to work out a follow-up visit.  Patient has mild COPD and emphysema.  Has chronic dyspnea.  Says overall breathing is doing okay with no flare of cough or wheezing.  He is on Stiolto daily.  Uses albuterol once or twice a week.  Allergies  Allergen Reactions  . Cephalexin Rash    Immunization History  Administered Date(s) Administered  . Influenza Split 08/05/2011, 08/19/2012, 07/29/2013  . Influenza, High Dose Seasonal PF 08/05/2016, 08/06/2017, 07/12/2018, 07/07/2019  . Influenza,inj,Quad PF,6+ Mos 07/20/2014  . Influenza-Unspecified 08/11/2015, 07/23/2017, 07/21/2019  . PFIZER SARS-COV-2 Vaccination 11/29/2019, 12/20/2019  . Pneumococcal Conjugate-13 03/18/2014, 08/02/2014  . Pneumococcal Polysaccharide-23 08/11/2001, 03/18/2008  . Tdap 09/10/2011, 10/15/2011  . Zoster 08/30/2014  . Zoster Recombinat (Shingrix) 12/02/2017, 02/09/2018    Past Medical History:  Diagnosis Date  . Anemia   . Arthritis   . BPH (benign prostatic hyperplasia)   . Cancer ([place])    Lung  . COPD (chronic obstructive pulmonary disease) ([place])   . Emphysema of lung ([place])   . Emphysema of lung ([place])   . Esophageal reflux   . Hyperlipidemia   . Hypertension   . Internal carotid artery stenosis, right   . Neuropathy of both feet   . Pneumonia    recurrent  . PTSD (post-traumatic stress disorder)    [place] vet  . Sebaceous cyst   . SOB (shortness of breath) on exertion   . TIA (transient ischemic attack)   . Urge incontinence     Tobacco History: Social History   Tobacco Use  Smoking Status Former Smoker  . Packs/day: 1.50  . Years: 50.00  . Pack years: 75.00  . Types: Cigarettes  . Quit date: 09/10/1999  . Years since quitting: 20.9  Smokeless Tobacco Never Used   Counseling given:  Not Answered   Outpatient Medications Prior to Visit  Medication Sig Dispense Refill  . albuterol (VENTOLIN HFA) 108 (90 Base) MCG/ACT inhaler Inhale 2 puffs into the lungs every 6 (six) hours as needed for wheezing or shortness of breath. 18 g 6  . Alpha-Lipoic Acid 600 MG CAPS Take 1 capsule (600 mg total) by mouth in the morning and at bedtime. 180 capsule 3  . amLODipine (NORVASC) 5 MG tablet Take 1 tablet (5 mg total) by mouth 2 (two) times daily. (Patient taking differently: Take 5 mg by mouth daily. ) 180 tablet 1  . aspirin EC 81 MG tablet Take 81 mg by mouth daily.     Marland Kitchen atorvastatin (LIPITOR) 80 MG tablet Take 40 mg by mouth daily.    . cetirizine (ZYRTEC) 10 MG tablet Take 1 tablet (10 mg total) by mouth daily. 30 tablet 11  . cholecalciferol (VITAMIN D) 1000 UNITS tablet Take 1,000 Units by mouth 2 (two) times daily.     . DULoxetine (CYMBALTA) 20 MG capsule Take 40 mg by mouth daily.    Marland Kitchen dutasteride (AVODART) 0.5 MG capsule Take 1 capsule (0.5 mg total) by mouth daily. 90 capsule 2  . famotidine (PEPCID) 20 MG tablet Take 1 tablet (20 mg total) by mouth daily as needed for heartburn or indigestion.    . gabapentin (NEURONTIN) 300 MG capsule Take 1 capsule (300 mg total) by mouth at bedtime. 90 capsule 3  . glucose blood test strip 1 each by Other route as needed. Use as instructed 100 each 1  . hydrochlorothiazide (HYDRODIURIL) 25 MG tablet Take 0.5 tablets (12.5  mg total) by mouth daily.    Marland Kitchen lisinopril (ZESTRIL) 10 MG tablet Take 1 tablet (10 mg total) by mouth daily. 90 tablet 3  . Multiple Vitamins-Minerals (MEGA MULTIVITAMIN FOR MEN) TABS Take 1 tablet by mouth daily.     Marland Kitchen omeprazole (PRILOSEC) 40 MG capsule Take 1 capsule (40 mg total) by mouth daily.    . tamsulosin (FLOMAX) 0.4 MG CAPS capsule Take 1 capsule (0.4 mg total) by mouth daily. 90 capsule 2  . Tiotropium Bromide-Olodaterol 2.5-2.5 MCG/ACT AERS Inhale 2 puffs into the lungs daily.    . valACYclovir (VALTREX)  500 MG tablet Take 1 tablet (500 mg total) by mouth every other day. 15 tablet 3   No facility-administered medications prior to visit.     Review of Systems:   Constitutional:   No  weight loss, night sweats,  Fevers, chills   +fatigue, or  lassitude.  HEENT:   No headaches,  Difficulty swallowing,  Tooth/dental problems, or  Sore throat,                No sneezing, itching, ear ache, nasal congestion, post nasal drip,   CV:  No chest pain,  Orthopnea, PND, swelling in lower extremities, anasarca, dizziness, palpitations, syncope.   GI  No heartburn, indigestion, abdominal pain, nausea, vomiting, diarrhea, change in bowel habits, loss of appetite, bloody stools.   Resp:    No excess mucus, no productive cough,  No non-productive cough,  No coughing up of blood.  No change in color of mucus.  No wheezing.  No chest wall deformity  Skin: no rash or lesions.  GU: no dysuria, change in color of urine, no urgency or frequency.  No flank pain, no hematuria   MS:  No joint pain or swelling.  No decreased range of motion.  No back pain.    Physical Exam  BP (!) 148/58 (BP Location: Left Arm, Cuff Size: Normal)   Pulse 81   Temp 98 F (36.7 C) (Temporal)   Ht 5\' 8"  (1.727 m)   Wt 230 lb (104.3 kg)   SpO2 95%   BMI 34.97 kg/m   GEN: A/Ox3; pleasant , NAD, well nourished    HEENT:  [place]/AT,   , NOSE-clear, THROAT-clear, no lesions, no postnasal drip or exudate noted.   NECK:  Supple w/ fair ROM; no JVD; normal carotid impulses w/o bruits; no thyromegaly or nodules palpated; no lymphadenopathy.    RESP  Clear  P & A; w/o, wheezes/ rales/ or rhonchi. no accessory muscle use, no dullness to percussion  CARD:  RRR, no m/r/g, tr  peripheral edema, pulses intact, no cyanosis or clubbing.  GI:   Soft & nt; nml bowel sounds; no organomegaly or masses detected.   Musco: Warm bil, no deformities or joint swelling noted.   Neuro: alert, no focal deficits noted.    Skin: Warm, no  lesions or rashes    Lab Results:  CBC   BNP  ProBNP No results found for: PROBNP  Imaging: NM PET Image Initial (PI) Skull Base To Thigh  Result Date: 07/11/2020 CLINICAL DATA:  Subsequent treatment strategy for hypermetabolic left upper lobe pulmonary nodule and history of suspected CLL/SLL. Bronchoscopic biopsy of left upper lobe pulmonary nodule on 06/26/2020 demonstrated no evidence of malignancy. EXAM: NUCLEAR MEDICINE PET SKULL BASE TO THIGH TECHNIQUE: 12.1 mCi F-18 FDG was injected intravenously. Full-ring PET imaging was performed from the skull base to thigh after the radiotracer. CT data was obtained and  used for attenuation correction and anatomic localization. Fasting blood glucose: 104 mg/dl COMPARISON:  08/05/2019 PET-CT.  06/08/2020 chest CT. FINDINGS: Mediastinal blood pool activity: SUV max 2.7 Liver activity: SUV max 4.0 NECK: No hypermetabolic lymph nodes in the neck. Incidental CT findings: none CHEST: Predominantly solid 1.9 cm hypermetabolic apical left upper lobe pulmonary nodule with max SUV 9.2 (series 3/image 76), previously 1.5 cm with max SUV 4.4 on 08/05/2019 PET-CT, increased in size, density and metabolism. Two new clustered solid pulmonary nodules more centrally in the left upper lobe, largest 0.8 cm with max SUV 1.9 (series 3/image 86). Newly hypermetabolic left hilar lymph nodes with max SUV 4.7. Newly hypermetabolic low left paratracheal 1.1 cm lymph node with max SUV 5.3 (series 3/image 94). Enlarged hypermetabolic 1.8 cm right paratracheal node with max SUV 4.1 (series 3/image 87), previously 1.8 cm with max SUV 3.0, stable in size and mildly increased in metabolism. Enlarged hypermetabolic 1.8 cm right subcarinal lymph node with max SUV 3.0 (series 3/image 108), previously 1.9 cm with max SUV 2.8, not appreciably changed in size or metabolism. Enlarged 1.2 cm left axillary lymph node with max SUV 2.2 (series 3/image 85), previously 1.0 cm with max SUV 1.5,  minimally increased in size and metabolism. Incidental CT findings: Mild cardiomegaly. Coronary atherosclerosis. Atherosclerotic nonaneurysmal thoracic aorta. Mild centrilobular emphysema. ABDOMEN/PELVIS: Borderline mild hypermetabolism in the spleen with max SUV 4.1, previous max SUV 3.4. Spleen remains normal size. No splenic masses. Mildly enlarged and mildly hypermetabolic 1.0 cm central mesenteric lymph node with max SUV 3.9 (series 3/image 177), previously 0.8 cm with max SUV 2.4, mildly increased in size and metabolism. Mildly enlarged 1.1 cm right external iliac lymph node with low level uptake with max SUV 2.3 (series 3/image 243), previously 0.9 cm with max SUV 1.8, minimally increased in size and metabolism. No additional hypermetabolic abdominopelvic lymph nodes. No abnormal hypermetabolic activity within the liver, pancreas or adrenal glands. Incidental CT findings: Small hiatal hernia. Mild prostatomegaly. Atherosclerotic nonaneurysmal abdominal aorta. Marked sigmoid diverticulosis. SKELETON: No focal hypermetabolic activity to suggest skeletal metastasis. Incidental CT findings: none IMPRESSION: 1. Predominantly solid 1.9 cm hypermetabolic apical left upper lobe pulmonary nodule, increased in size, density and metabolism since 08/05/2019 PET-CT, most compatible with primary bronchogenic carcinoma. 2. Two new clustered solid pulmonary nodules more centrally in the left upper lobe, largest 0.8 cm with low level uptake (max SUV 1.9), cannot exclude pulmonary metastases within the same lobe. 3. New hypermetabolic left hilar and low left paratracheal lymph nodes, suspicious for nodal metastases related to the suspected apical left upper lobe primary bronchogenic carcinoma. 4. Previously described mildly hypermetabolic right paratracheal, subcarinal, left axillary, central mesenteric and right external iliac lymphadenopathy is overall mildly increased as detailed. New mild splenic hypermetabolism. Spleen  remains normal size. These findings are most suggestive of mild progression of previously described chronic lymphoproliferative disorder. 5. No new extrathoracic hypermetabolic findings to suggest extrathoracic metastatic disease from suspected left upper lobe lung cancer. 6. Chronic findings include: Aortic Atherosclerosis (ICD10-I70.0) and Emphysema (ICD10-J43.9). Coronary atherosclerosis. Mild cardiomegaly. Small hiatal hernia. Mild prostatomegaly. Marked sigmoid diverticulosis. Electronically Signed   By: Ilona Sorrel M.D.   On: 07/11/2020 16:55      No flowsheet data found.  No results found for: NITRICOXIDE      Assessment & Plan:   Chronic obstructive pulmonary disease ([place]) Compensated on present regimen. Patient says he will get the flu shot in a couple weeks as he has this planned  Plan  Patient Instructions  Continue on Stiolto 2 puffs daily  Albuterol inhaler As needed   Activity as tolerated.  Follow up with Oncology and Radiation Oncology .  [facility]  Follow up with Dr. Patsey Berthold in 4 months and As needed       Lung nodules Enlarging lung nodules hypermetabolic on PET scan.  Patient is underwent 2 navigational bronchoscopy's that have been inconclusive.  Highly suspicious for underlying malignancy.  Patient has been seen by oncology and now radiation oncology with recommendations for radiation treatment-SBRT. Unfortunately patient's insurance has a high copayment (estimated at $12,000) patient is looking into coverage at the [place].  Have encouraged patient to follow-up with radiation oncology and notify them as they may be able to help facilitate this as he does not want to delay treatment  Plan  Patient Instructions  Continue on Stiolto 2 puffs daily  Albuterol inhaler As needed   Activity as tolerated.  Follow up with Oncology and Radiation Oncology .  [facility] as discussed as [facility] as possible    Follow up with Dr. Patsey Berthold in 4 months and As needed          Rexene Edison, NP 08/03/2020

## 2020-08-03 NOTE — Assessment & Plan Note (Signed)
Compensated on present regimen. Patient says he will get the flu shot in a couple weeks as he has this planned  Plan  Patient Instructions  Continue on Stiolto 2 puffs daily  Albuterol inhaler As needed   Activity as tolerated.  Follow up with Oncology and Radiation Oncology .  [facility] as discussed as soon as possible  Follow up with Dr. Patsey Berthold in 4 months and As needed

## 2020-08-03 NOTE — Progress Notes (Signed)
Agree with the details of the procedure as noted by Tammy Parrett, NP.  C. Laura Swayze Pries, MD [facility] 

## 2020-08-03 NOTE — Assessment & Plan Note (Signed)
Enlarging lung nodules hypermetabolic on PET scan.  Patient is underwent 2 navigational bronchoscopy's that have been inconclusive.  Highly suspicious for underlying malignancy.  Patient has been seen by oncology and now radiation oncology with recommendations for radiation treatment-SBRT. Unfortunately patient's insurance has a high copayment (estimated at $12,000) patient is looking into coverage at the [place].  Have encouraged patient to follow-up with radiation oncology and notify them as they may be able to help facilitate this as he does not want to delay treatment  Plan  Patient Instructions  Continue on Stiolto 2 puffs daily  Albuterol inhaler As needed   Activity as tolerated.  Follow up with Oncology and Radiation Oncology .  [facility] as discussed as [facility] as possible  Follow up with Dr. Patsey Berthold in 4 months and As needed

## 2020-08-03 NOTE — Patient Instructions (Signed)
Continue on Stiolto 2 puffs daily  Albuterol inhaler As needed   Activity as tolerated.  Follow up with Oncology and Radiation Oncology .  [facility] as discussed as [facility] as possible  Follow up with Dr. Patsey Berthold in 4 months and As needed

## 2020-08-07 ENCOUNTER — Ambulatory Visit
Admission: RE | Admit: 2020-08-07 | Discharge: 2020-08-07 | Disposition: A | Discharge status: Home / Self Care | Payer: Medicare HMO | Source: Ambulatory Visit | Attending: Radiation Oncology | Admitting: Radiation Oncology

## 2020-08-07 DIAGNOSIS — C3412 Malignant neoplasm of upper lobe, left bronchus or lung: Secondary | ICD-10-CM | POA: Insufficient documentation

## 2020-08-07 DIAGNOSIS — R911 Solitary pulmonary nodule: Secondary | ICD-10-CM | POA: Diagnosis not present

## 2020-08-08 ENCOUNTER — Ambulatory Visit: Payer: Medicare HMO

## 2020-08-10 ENCOUNTER — Ambulatory Visit: Payer: Medicare HMO

## 2020-08-11 DIAGNOSIS — C3412 Malignant neoplasm of upper lobe, left bronchus or lung: Secondary | ICD-10-CM | POA: Diagnosis not present

## 2020-08-11 DIAGNOSIS — R911 Solitary pulmonary nodule: Secondary | ICD-10-CM | POA: Diagnosis not present

## 2020-08-15 ENCOUNTER — Ambulatory Visit: Payer: Medicare HMO

## 2020-08-16 ENCOUNTER — Inpatient Hospital Stay: Payer: Medicare HMO | Attending: Radiation Oncology

## 2020-08-16 ENCOUNTER — Ambulatory Visit: Payer: Medicare HMO | Admitting: Oncology

## 2020-08-17 ENCOUNTER — Ambulatory Visit: Payer: Medicare HMO

## 2020-08-22 ENCOUNTER — Ambulatory Visit: Payer: Medicare HMO

## 2020-08-23 DIAGNOSIS — Z01818 Encounter for other preprocedural examination: Secondary | ICD-10-CM | POA: Diagnosis not present

## 2020-08-23 DIAGNOSIS — J431 Panlobular emphysema: Secondary | ICD-10-CM | POA: Diagnosis not present

## 2020-08-24 ENCOUNTER — Ambulatory Visit: Admission: RE | Admit: 2020-08-24 | Payer: Medicare HMO | Source: Ambulatory Visit

## 2020-08-25 NOTE — Progress Notes (Deleted)
[place]  [phone] [phone]) [phone]  ID: Donald [name] OB: 1933/05/04  MR#: [id number]  SWF#:[id number]  Patient Care Team: McLean-Scocuzza, Nino Glow, MD as PCP - General (Internal Medicine) Rockey Situ Kathlene November, MD as Consulting Physician (Cardiology) Telford Nab, RN as Registered Nurse Grayland Ormond, Kathlene November, MD as Consulting Physician (Oncology)  I connected with Donald [name] on 08/25/20 at 11:00 AM EDT by video enabled telemedicine visit and verified that I am speaking with the correct person using two identifiers.   I discussed the limitations, risks, security and privacy concerns of performing an evaluation and management service by telemedicine and the availability of in-person appointments. I also discussed with the patient that there may be a patient responsible charge related to this service. The patient expressed understanding and agreed to proceed.   Other persons participating in the visit and their role in the encounter: Patient, MD.  Patient's location: Clinic Provider's location: Home.  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   INTERVAL HISTORY: Patient agreed to video assisted telemedicine visit to discuss his biopsy results and additional diagnostic planning.  He continues to feel well and remains asymptomatic. He has no neurologic complaints.  He denies any recent fevers or illnesses.  He has a good appetite and denies weight loss.  He denies any night sweats.  He has no chest pain, cough, hemoptysis, or shortness of breath.  He denies any nausea, vomiting, constipation, or diarrhea.  He has no urinary complaints.  Patient feels at his baseline and offers no specific complaints today.  Review of Systems  Constitutional: Negative.  Negative for fever, malaise/fatigue and weight loss.  Respiratory: Negative.   Cardiovascular: Negative.  Negative for chest pain and leg swelling.   Gastrointestinal: Negative.  Negative for abdominal pain.  Genitourinary: Negative.  Negative for dysuria.  Musculoskeletal: Negative.  Negative for back pain.  Skin: Negative.  Negative for rash.  Neurological: Negative.  Negative for dizziness, focal weakness, weakness and headaches.  Psychiatric/Behavioral: Negative.  The patient is not nervous/anxious.    As per HPI. Otherwise, a complete review of systems is negative.  PAST MEDICAL HISTORY: Past Medical History:  Diagnosis Date  . Anemia   . Arthritis   . BPH (benign prostatic hyperplasia)   . Cancer ([place])    Lung  . COPD (chronic obstructive pulmonary disease) ([place])   . Emphysema of lung ([place])   . Emphysema of lung ([place])   . Esophageal reflux   . Hyperlipidemia   . Hypertension   . Internal carotid artery stenosis, right   . Neuropathy of both feet   . Pneumonia    recurrent  . PTSD (post-traumatic stress disorder)    [place] vet  . Sebaceous cyst   . SOB (shortness of breath) on exertion   . TIA (transient ischemic attack)   . Urge incontinence     PAST SURGICAL HISTORY: Past Surgical History:  Procedure Laterality Date  . CYST EXCISION     buttocks  . ELECTROMAGNETIC NAVIGATION BROCHOSCOPY Left 08/23/2019   Procedure: ELECTROMAGNETIC NAVIGATION BRONCHOSCOPY;  Surgeon: Tyler Pita, MD;  Location: [facility] ORS;  Service: Cardiopulmonary;  Laterality: Left;  . ENDOBRONCHIAL ULTRASOUND N/A 08/23/2019   Procedure: ENDOBRONCHIAL ULTRASOUND;  Surgeon: Tyler Pita, MD;  Location: [facility] ORS;  Service: Cardiopulmonary;  Laterality: N/A;  . HEMORRHOID SURGERY    . shave biopsy  03/13/2020   left neck 03/13/20 [facility] wart with atypica inflamed +AK  no carcinoma   . SINUSOTOMY    . VIDEO BRONCHOSCOPY WITH ENDOBRONCHIAL NAVIGATION Left 06/26/2020   Procedure: VIDEO BRONCHOSCOPY WITH ENDOBRONCHIAL NAVIGATION;  Surgeon: Tyler Pita, MD;  Location: [facility] ORS;  Service: Pulmonary;  Laterality: Left;     FAMILY HISTORY: Family History  Problem Relation Age of Onset  . Heart disease Mother   . Cancer Maternal Aunt        lung  . Lung cancer Maternal Aunt   . Leukemia Maternal Aunt   . Hypertension Son     ADVANCED DIRECTIVES (Y/N):  N  HEALTH MAINTENANCE: Social History   Tobacco Use  . Smoking status: Former Smoker    Packs/day: 1.50    Years: 50.00    Pack years: 75.00    Types: Cigarettes    Quit date: 09/10/1999    Years since quitting: 20.9  . Smokeless tobacco: Never Used  Vaping Use  . Vaping Use: Never used  Substance Use Topics  . Alcohol use: No    Alcohol/week: 0.0 standard drinks    Comment: not since 12/1980  . Drug use: No     Colonoscopy:  PAP:  Bone density:  Lipid panel:  Allergies  Allergen Reactions  . Cephalexin Rash    Current Outpatient Medications  Medication Sig Dispense Refill  . albuterol (VENTOLIN HFA) 108 (90 Base) MCG/ACT inhaler Inhale 2 puffs into the lungs every 6 (six) hours as needed for wheezing or shortness of breath. 18 g 6  . Alpha-Lipoic Acid 600 MG CAPS Take 1 capsule (600 mg total) by mouth in the morning and at bedtime. 180 capsule 3  . amLODipine (NORVASC) 5 MG tablet Take 1 tablet (5 mg total) by mouth 2 (two) times daily. (Patient taking differently: Take 5 mg by mouth daily. ) 180 tablet 1  . aspirin EC 81 MG tablet Take 81 mg by mouth daily.     Marland Kitchen atorvastatin (LIPITOR) 80 MG tablet Take 40 mg by mouth daily.    . cetirizine (ZYRTEC) 10 MG tablet Take 1 tablet (10 mg total) by mouth daily. 30 tablet 11  . cholecalciferol (VITAMIN D) 1000 UNITS tablet Take 1,000 Units by mouth 2 (two) times daily.     . DULoxetine (CYMBALTA) 20 MG capsule Take 40 mg by mouth daily.    Marland Kitchen dutasteride (AVODART) 0.5 MG capsule Take 1 capsule (0.5 mg total) by mouth daily. 90 capsule 2  . famotidine (PEPCID) 20 MG tablet Take 1 tablet (20 mg total) by mouth daily as needed for heartburn or indigestion.    . gabapentin (NEURONTIN) 300  MG capsule Take 1 capsule (300 mg total) by mouth at bedtime. 90 capsule 3  . glucose blood test strip 1 each by Other route as needed. Use as instructed 100 each 1  . hydrochlorothiazide (HYDRODIURIL) 25 MG tablet Take 0.5 tablets (12.5 mg total) by mouth daily.    Marland Kitchen lisinopril (ZESTRIL) 10 MG tablet Take 1 tablet (10 mg total) by mouth daily. 90 tablet 3  . Multiple Vitamins-Minerals (MEGA MULTIVITAMIN FOR MEN) TABS Take 1 tablet by mouth daily.     Marland Kitchen omeprazole (PRILOSEC) 40 MG capsule Take 1 capsule (40 mg total) by mouth daily.    . tamsulosin (FLOMAX) 0.4 MG CAPS capsule Take 1 capsule (0.4 mg total) by mouth daily. 90 capsule 2  . Tiotropium Bromide-Olodaterol 2.5-2.5 MCG/ACT AERS Inhale 2 puffs into the lungs daily.    . valACYclovir (VALTREX) 500 MG tablet Take 1 tablet (500  mg total) by mouth every other day. 15 tablet 3   No current facility-administered medications for this visit.    OBJECTIVE: There were no vitals filed for this visit.   There is no height or weight on file to calculate BMI.    ECOG FS:0 - Asymptomatic  General: Well-developed, well-nourished, no acute distress. HEENT: Normocephalic. Neuro: Alert, answering all questions appropriately. Cranial nerves grossly intact. Psych: Normal affect.  LAB RESULTS:  Lab Results  Component Value Date   NA 139 06/08/2020   K 3.9 06/08/2020   CL 106 06/08/2020   CO2 26 06/08/2020   GLUCOSE 120 (H) 06/08/2020   BUN 26 (H) 06/08/2020   CREATININE 1.65 (H) 06/08/2020   CALCIUM 8.8 (L) 06/08/2020   PROT 6.5 03/06/2020   ALBUMIN 4.3 03/06/2020   AST 18 03/06/2020   ALT 15 03/06/2020   ALKPHOS 80 03/06/2020   BILITOT 0.3 03/06/2020   GFRNONAA 37 (L) 06/08/2020   GFRAA 43 (L) 06/08/2020    Lab Results  Component Value Date   WBC 16.7 (H) 06/08/2020   NEUTROABS 6.1 06/08/2020   HGB 9.4 (L) 06/08/2020   HCT 28.9 (L) 06/08/2020   MCV 86.3 06/08/2020   PLT 225 06/08/2020     STUDIES: No results  found.  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  CT scan results from May 24, 2020 reviewed independently and report as above with essentially unchanged in his known lymphadenopathy in bilateral axilla, mediastinal, retroperitoneal, iliac, and inguinal lymph nodes.  PET scan results from August 05, 2019 with minimal metabolic changes to known lymphadenopathy compatible with a chronic low-grade lymphoproliferative disorder.  Previously, his flow cytometry panel reported a CD5+ and CD23+ clonal B-cell population, highly suspicious for CLL/SLL.  Patient does not require biopsy, but will consider one if patient required treatment.  Continue to monitor closely.   2.  Left upper lobe pulmonary nodule: Despite lesion being hypermetabolic on PET scan from August 05, 2019, subsequent navigational biopsy was inconclusive.  CT scan results as above with mild progression of disease.  Patient underwent second navigational biopsy and once again results were inconclusive.  Will repeat PET scan and refer patient to radiation oncology to see if treatment is an option without definitive diagnosis.  Follow-up with medical oncology will depend on radiation oncology evaluation. 3.  Anemia: Chronic and unchanged.  I provided 20 minutes of face-to-face video visit time during this encounter which included chart review, counseling, and coordination of care as documented above.   Patient expressed understanding and was in agreement with this plan. He also understands that He can call clinic at any time with any questions, concerns, or complaints.   Cancer Staging No matching staging information was found for the patient.  Lloyd Huger, MD   08/25/2020 10:09 AM

## 2020-08-25 NOTE — Progress Notes (Signed)
Agree with the details of the procedure as noted by Rexene Edison, NP.  Agree patient should not delay therapy.  He should investigate treatment options with the [place].  Renold Don, MD [facility]

## 2020-08-29 ENCOUNTER — Telehealth: Payer: Self-pay | Admitting: *Deleted

## 2020-08-29 ENCOUNTER — Ambulatory Visit: Payer: Medicare HMO

## 2020-08-29 NOTE — Telephone Encounter (Signed)
Patient scheduled tomorrow for follow up, has not started radiation at this time. Patient transferred his care to the [facility] [place] for radiation services due to cost of treatment. Patient is awaiting call back to get radiation treatments scheduled. He will call us when he gets his schedule so we can get him back in for follow up. I told patient if I had not heard from him in 1-2 weeks I would call him back and follow up.

## 2020-08-30 ENCOUNTER — Inpatient Hospital Stay: Payer: Medicare HMO | Admitting: Oncology

## 2020-08-30 DIAGNOSIS — R911 Solitary pulmonary nodule: Secondary | ICD-10-CM

## 2020-08-30 DIAGNOSIS — C9110 Chronic lymphocytic leukemia of B-cell type not having achieved remission: Secondary | ICD-10-CM

## 2020-09-07 ENCOUNTER — Ambulatory Visit: Payer: Medicare Other | Admitting: Physician Assistant

## 2020-09-12 ENCOUNTER — Encounter: Payer: Self-pay | Admitting: *Deleted

## 2020-09-12 NOTE — Progress Notes (Signed)
  Oncology Nurse Navigator Documentation  Navigator Location: [facility] (09/12/20 1400)   )Navigator Encounter Type: Appt/Treatment Plan Review (09/12/20 1400)                         Barriers/Navigation Needs: No Barriers At This Time;No Needs (09/12/20 1400)   Interventions: None Required (09/12/20 1400)           Appt / treatment plan review. Pt will receive radiation treatments at the [facility] due to cost. Pt will continue to follow up after completing radiation treatment. Pt to call to schedule appts for follow up if desires follow up with Dr. Grayland Ormond. Nothing further needed at this time.           Time Spent with Patient: 30 (09/12/20 1400)

## 2020-10-31 ENCOUNTER — Other Ambulatory Visit: Payer: Self-pay

## 2020-10-31 ENCOUNTER — Encounter: Payer: Self-pay | Admitting: Internal Medicine

## 2020-10-31 ENCOUNTER — Telehealth (INDEPENDENT_AMBULATORY_CARE_PROVIDER_SITE_OTHER): Payer: Medicare HMO | Admitting: Internal Medicine

## 2020-10-31 ENCOUNTER — Other Ambulatory Visit: Payer: Medicare HMO

## 2020-10-31 VITALS — BP 138/55 | HR 95 | Ht 68.0 in | Wt 224.0 lb

## 2020-10-31 DIAGNOSIS — Z20822 Contact with and (suspected) exposure to covid-19: Secondary | ICD-10-CM

## 2020-10-31 DIAGNOSIS — I2510 Atherosclerotic heart disease of native coronary artery without angina pectoris: Secondary | ICD-10-CM | POA: Insufficient documentation

## 2020-10-31 DIAGNOSIS — C3492 Malignant neoplasm of unspecified part of left bronchus or lung: Secondary | ICD-10-CM | POA: Insufficient documentation

## 2020-10-31 DIAGNOSIS — N400 Enlarged prostate without lower urinary tract symptoms: Secondary | ICD-10-CM | POA: Insufficient documentation

## 2020-10-31 DIAGNOSIS — J441 Chronic obstructive pulmonary disease with (acute) exacerbation: Secondary | ICD-10-CM

## 2020-10-31 DIAGNOSIS — K449 Diaphragmatic hernia without obstruction or gangrene: Secondary | ICD-10-CM | POA: Insufficient documentation

## 2020-10-31 DIAGNOSIS — R591 Generalized enlarged lymph nodes: Secondary | ICD-10-CM

## 2020-10-31 DIAGNOSIS — K5790 Diverticulosis of intestine, part unspecified, without perforation or abscess without bleeding: Secondary | ICD-10-CM | POA: Insufficient documentation

## 2020-10-31 DIAGNOSIS — I517 Cardiomegaly: Secondary | ICD-10-CM | POA: Insufficient documentation

## 2020-10-31 DIAGNOSIS — I700 Atherosclerosis of aorta: Secondary | ICD-10-CM | POA: Insufficient documentation

## 2020-10-31 MED ORDER — PREDNISONE 20 MG PO TABS: 40.0000 mg | ORAL_TABLET | Freq: Every day | ORAL | 0 refills | Status: DC

## 2020-10-31 MED ORDER — DOXYCYCLINE HYCLATE 100 MG PO TABS: 100.0000 mg | ORAL_TABLET | Freq: Two times a day (BID) | ORAL | 0 refills | Status: DC

## 2020-10-31 MED ORDER — TRELEGY ELLIPTA 100-62.5-25 MCG/INH IN AEPB: 1.0000 | INHALATION_SPRAY | Freq: Every day | RESPIRATORY_TRACT | 0 refills | Status: DC

## 2020-10-31 MED ORDER — BENZONATATE 100 MG PO CAPS: 200.0000 mg | ORAL_CAPSULE | Freq: Three times a day (TID) | ORAL | 0 refills | Status: DC | PRN

## 2020-10-31 NOTE — Progress Notes (Signed)
Patient's wife tested positive.

## 2020-10-31 NOTE — Progress Notes (Signed)
Telephone Note  I connected with Donald [name]  on 10/31/20 at  8:30 AM EST by telephone and verified that I am speaking with the correct person using two identifiers.  Location patient: home, [place] Location provider:work or home office Persons participating in the virtual visit: patient, provider pts wife  I discussed the limitations of evaluation and management by telemedicine and the availability of in person appointments. The patient expressed understanding and agreed to proceed.   HPI: 1. Copd history, left bronchogenic ca with lymphadenopathy and covid exposure wife + 10/13/20 she is doing better but he c/o cough with sputum worse last week and wheezing and sob with with exertion but has baseline sob, tried otc cold and flu medication. No fever O2 today 94%. 07/11/20 pet with bronchogenic ca had 10 radiation txs with the [facility] ended 1-2 weeks ago and will f/u in 2-3 weeks for f/u imaging and plan of care  Need to get records from the [place]   ROS: See pertinent positives and negatives per HPI.  Past Medical History:  Diagnosis Date   Anemia    Arthritis    BPH (benign prostatic hyperplasia)    Cancer (HCC)    Lung   COPD (chronic obstructive pulmonary disease) (HCC)    Emphysema of lung (HCC)    Emphysema of lung (HCC)    Esophageal reflux    Hyperlipidemia    Hypertension    Internal carotid artery stenosis, right    Neuropathy of both feet    Pneumonia    recurrent   PTSD (post-traumatic stress disorder)    [place] vet   Sebaceous cyst    SOB (shortness of breath) on exertion    TIA (transient ischemic attack)    Urge incontinence     Past Surgical History:  Procedure Laterality Date   CYST EXCISION     buttocks   ELECTROMAGNETIC NAVIGATION BROCHOSCOPY Left 08/23/2019   Procedure: ELECTROMAGNETIC NAVIGATION BRONCHOSCOPY;  Surgeon: Tyler Pita, MD;  Location: [facility] ORS;  Service: Cardiopulmonary;  Laterality: Left;   ENDOBRONCHIAL ULTRASOUND  N/A 08/23/2019   Procedure: ENDOBRONCHIAL ULTRASOUND;  Surgeon: Tyler Pita, MD;  Location: [facility] ORS;  Service: Cardiopulmonary;  Laterality: N/A;   HEMORRHOID SURGERY     shave biopsy  03/13/2020   left neck 03/13/20 [facility] wart with atypica inflamed +AK no carcinoma    SINUSOTOMY     VIDEO BRONCHOSCOPY WITH ENDOBRONCHIAL NAVIGATION Left 06/26/2020   Procedure: VIDEO BRONCHOSCOPY WITH ENDOBRONCHIAL NAVIGATION;  Surgeon: Tyler Pita, MD;  Location: [facility];  Service: Pulmonary;  Laterality: Left;     Current Outpatient Medications:    albuterol (VENTOLIN HFA) 108 (90 Base) MCG/ACT inhaler, Inhale 2 puffs into the lungs every 6 (six) hours as needed for wheezing or shortness of breath., Disp: 18 g, Rfl: 6   amLODipine (NORVASC) 5 MG tablet, Take 1 tablet (5 mg total) by mouth 2 (two) times daily. (Patient taking differently: Take 5 mg by mouth daily.), Disp: 180 tablet, Rfl: 1   aspirin EC 81 MG tablet, Take 81 mg by mouth daily. , Disp: , Rfl:    cholecalciferol (VITAMIN D) 1000 UNITS tablet, Take 1,000 Units by mouth 2 (two) times daily. , Disp: , Rfl:    DULoxetine (CYMBALTA) 20 MG capsule, Take 40 mg by mouth daily., Disp: , Rfl:    famotidine (PEPCID) 20 MG tablet, Take 1 tablet (20 mg total) by mouth daily as needed for heartburn or indigestion., Disp: ,  Rfl:    gabapentin (NEURONTIN) 300 MG capsule, Take 1 capsule (300 mg total) by mouth at bedtime., Disp: 90 capsule, Rfl: 3   glucose blood test strip, 1 each by Other route as needed. Use as instructed, Disp: 100 each, Rfl: 1   hydrochlorothiazide (HYDRODIURIL) 25 MG tablet, Take 0.5 tablets (12.5 mg total) by mouth daily., Disp: , Rfl:    lisinopril (ZESTRIL) 10 MG tablet, Take 1 tablet (10 mg total) by mouth daily., Disp: 90 tablet, Rfl: 3   Multiple Vitamins-Minerals (MEGA MULTIVITAMIN FOR MEN) TABS, Take 1 tablet by mouth daily. , Disp: , Rfl:    omeprazole (PRILOSEC) 40 MG capsule, Take 1  capsule (40 mg total) by mouth daily., Disp: , Rfl:    tamsulosin (FLOMAX) 0.4 MG CAPS capsule, Take 1 capsule (0.4 mg total) by mouth daily., Disp: 90 capsule, Rfl: 2   Tiotropium Bromide-Olodaterol 2.5-2.5 MCG/ACT AERS, Inhale 2 puffs into the lungs daily., Disp:  , Rfl:    atorvastatin (LIPITOR) 80 MG tablet, Take 40 mg by mouth daily. (Patient not taking: Reported on 10/31/2020), Disp: , Rfl:    benzonatate (TESSALON) 100 MG capsule, Take 2 capsules (200 mg total) by mouth 3 (three) times daily as needed for cough., Disp: 40 capsule, Rfl: 0   doxycycline (VIBRA-TABS) 100 MG tablet, Take 1 tablet (100 mg total) by mouth 2 (two) times daily. With food, Disp: 14 tablet, Rfl: 0   predniSONE (DELTASONE) 20 MG tablet, Take 2 tablets (40 mg total) by mouth daily with breakfast. X 5-7  Days with food, Disp: 14 tablet, Rfl: 0  EXAM:  VITALS per patient if applicable:  GENERAL: alert, oriented, appears well and in no acute distress  PSYCH/NEURO: pleasant and cooperative, no obvious depression or anxiety, speech and thought processing grossly intact  ASSESSMENT AND PLAN:  Discussed the following assessment and plan:  COPD exacerbation ([place]) with covid exposure - Plan: Novel Coronavirus, NAA ([facility]), predniSONE (DELTASONE) 20 MG tablet, benzonatate (TESSALON) 100 MG capsule, doxycycline (VIBRA-TABS) 100 MG tablet bid x 1 week  rec robitussin dm  Given sample trelegy  If +covid rec mab infusion   Bronchogenic cancer of left lung (HCC) Lymphadenopathy S/p 10 radiation txs in [place] need to get ROI      -we discussed possible serious and likely etiologies, options for evaluation and workup, limitations of telemedicine visit vs in person visit, treatment, treatment risks and precautions.   I discussed the assessment and treatment plan with the patient. The patient was provided an opportunity to ask questions and all were answered. The patient agreed with the plan and demonstrated an  understanding of the instructions.    Time spent 20 min Delorise Jackson, MD

## 2020-11-02 LAB — NOVEL CORONAVIRUS, NAA: SARS-CoV-2, NAA: NOT DETECTED

## 2020-11-02 LAB — SARS-COV-2, NAA 2 DAY TAT

## 2020-12-12 DIAGNOSIS — K219 Gastro-esophageal reflux disease without esophagitis: Secondary | ICD-10-CM | POA: Diagnosis not present

## 2020-12-12 DIAGNOSIS — Z6834 Body mass index (BMI) 34.0-34.9, adult: Secondary | ICD-10-CM | POA: Diagnosis not present

## 2020-12-12 DIAGNOSIS — I10 Essential (primary) hypertension: Secondary | ICD-10-CM | POA: Diagnosis not present

## 2020-12-12 DIAGNOSIS — E6609 Other obesity due to excess calories: Secondary | ICD-10-CM | POA: Diagnosis not present

## 2020-12-12 DIAGNOSIS — C3492 Malignant neoplasm of unspecified part of left bronchus or lung: Secondary | ICD-10-CM | POA: Diagnosis not present

## 2020-12-12 DIAGNOSIS — I2510 Atherosclerotic heart disease of native coronary artery without angina pectoris: Secondary | ICD-10-CM | POA: Diagnosis not present

## 2020-12-12 DIAGNOSIS — J431 Panlobular emphysema: Secondary | ICD-10-CM | POA: Diagnosis not present

## 2020-12-12 DIAGNOSIS — E119 Type 2 diabetes mellitus without complications: Secondary | ICD-10-CM | POA: Diagnosis not present

## 2020-12-12 DIAGNOSIS — C9110 Chronic lymphocytic leukemia of B-cell type not having achieved remission: Secondary | ICD-10-CM | POA: Diagnosis not present

## 2021-01-10 DIAGNOSIS — E1122 Type 2 diabetes mellitus with diabetic chronic kidney disease: Secondary | ICD-10-CM | POA: Diagnosis not present

## 2021-01-10 DIAGNOSIS — N189 Chronic kidney disease, unspecified: Secondary | ICD-10-CM | POA: Diagnosis not present

## 2021-01-16 DIAGNOSIS — M5416 Radiculopathy, lumbar region: Secondary | ICD-10-CM | POA: Diagnosis not present

## 2021-01-16 DIAGNOSIS — M25552 Pain in left hip: Secondary | ICD-10-CM | POA: Diagnosis not present

## 2021-01-16 DIAGNOSIS — M7062 Trochanteric bursitis, left hip: Secondary | ICD-10-CM | POA: Diagnosis not present

## 2021-01-26 ENCOUNTER — Telehealth: Payer: Self-pay | Admitting: Cardiovascular Disease

## 2021-01-26 ENCOUNTER — Ambulatory Visit: Payer: Medicare HMO | Admitting: Cardiovascular Disease

## 2021-01-26 NOTE — Progress Notes (Deleted)
No show

## 2021-01-26 NOTE — Telephone Encounter (Signed)
-----   Message from Minna Merritts, MD sent at 01/26/2021  9:37 AM EDT ----- Unless it is indicated otherwise he is followed by cardiology now at [facility] for the past several years We probably do not need to rebook, he was a no-show March 18

## 2021-01-29 ENCOUNTER — Encounter: Payer: Self-pay | Admitting: Cardiovascular Disease

## 2021-01-31 ENCOUNTER — Ambulatory Visit: Payer: Medicare HMO | Admitting: Internal Medicine

## 2021-02-06 DIAGNOSIS — R0602 Shortness of breath: Secondary | ICD-10-CM | POA: Diagnosis not present

## 2021-02-06 DIAGNOSIS — I10 Essential (primary) hypertension: Secondary | ICD-10-CM | POA: Diagnosis not present

## 2021-02-06 DIAGNOSIS — I2510 Atherosclerotic heart disease of native coronary artery without angina pectoris: Secondary | ICD-10-CM | POA: Diagnosis not present

## 2021-02-06 DIAGNOSIS — I4892 Unspecified atrial flutter: Secondary | ICD-10-CM | POA: Diagnosis not present

## 2021-02-09 IMAGING — CT CT CHEST W/ CM
2 of 5 series · 11 of 36 positions shown, 13 images · IV contrast (omnipaque)
Comparison: CT chest abdomen pelvis 01/21/2019, 07/21/2018, PET-CT,
04/10/2018

CLINICAL DATA: Follow-up CLL

EXAM:
CT CHEST, ABDOMEN, AND PELVIS WITH CONTRAST
TECHNIQUE: Multidetector CT imaging of the chest, abdomen and pelvis was
performed following the standard protocol during bolus
administration of intravenous contrast.
CONTRAST:  100mL OMNIPAQUE IOHEXOL 300 MG/ML SOLN, additional oral
enteric contrast

[Series 2: axials cap · axial · 0.98mm/px · z∈[-1530,-945]mm · 8 of 145 slices shown, 10 images]
[im 14/145  mediastinal]
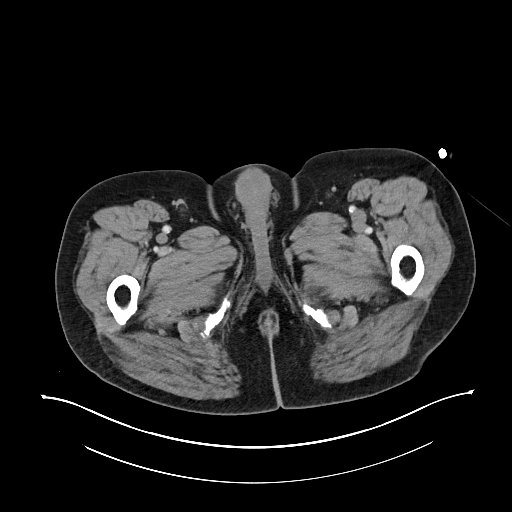
[im 14/145  lung]
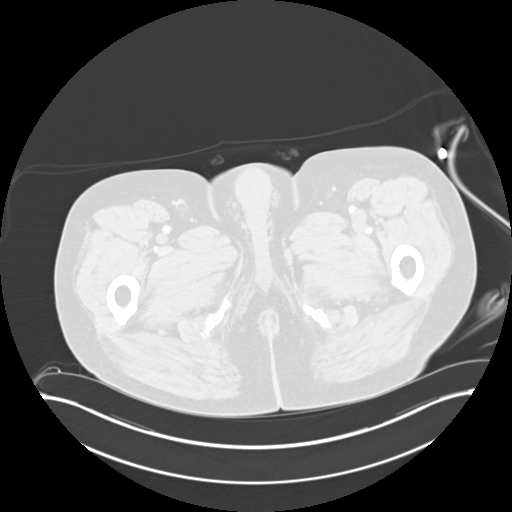
[im 27/145  lung]
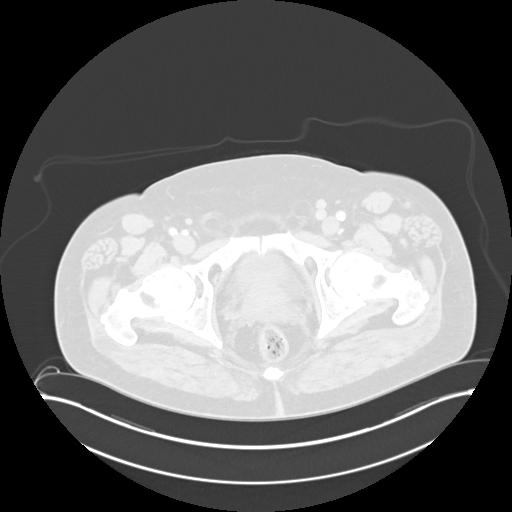
[im 53/145  lung]
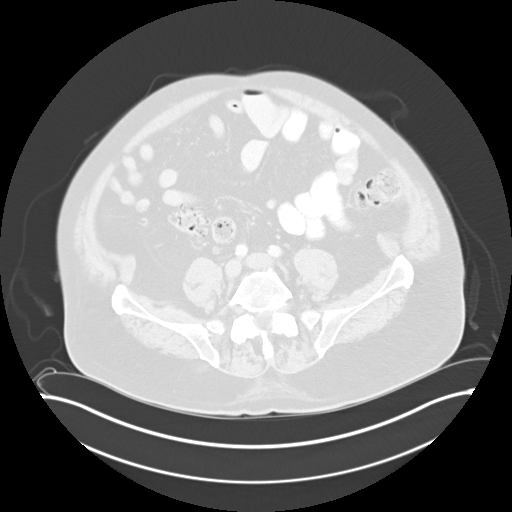
[im 66/145  lung]
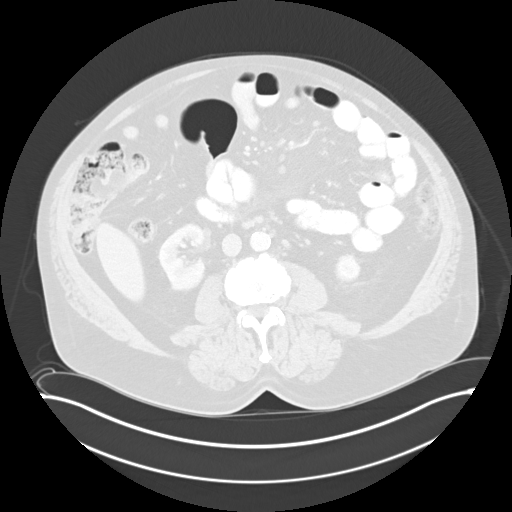
[im 79/145  mediastinal]
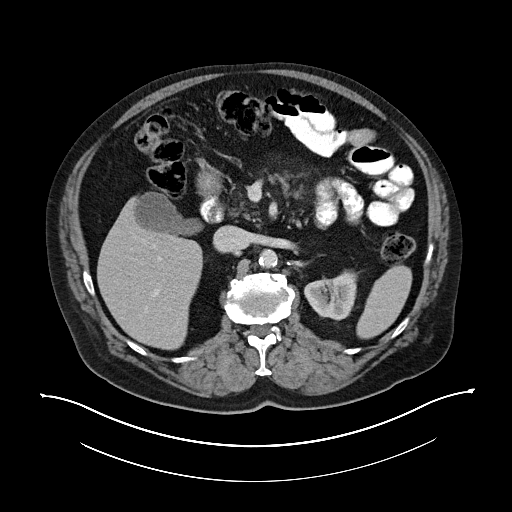
[im 79/145  lung]
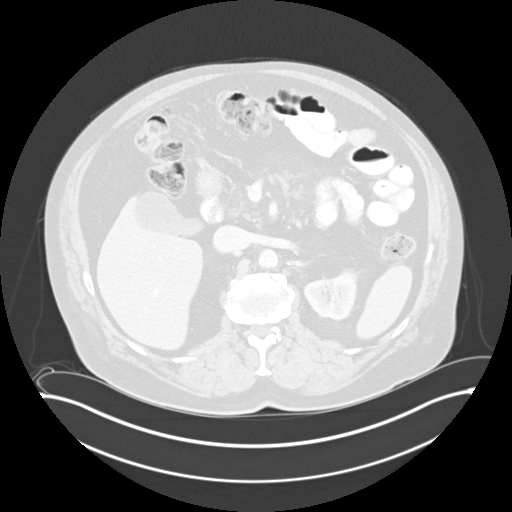
[im 92/145  lung]
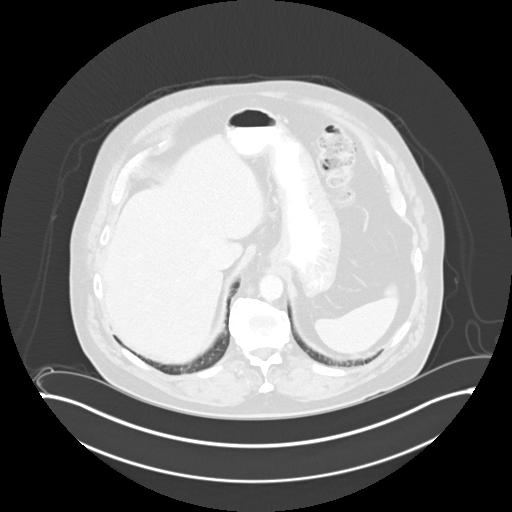
[im 118/145  lung]
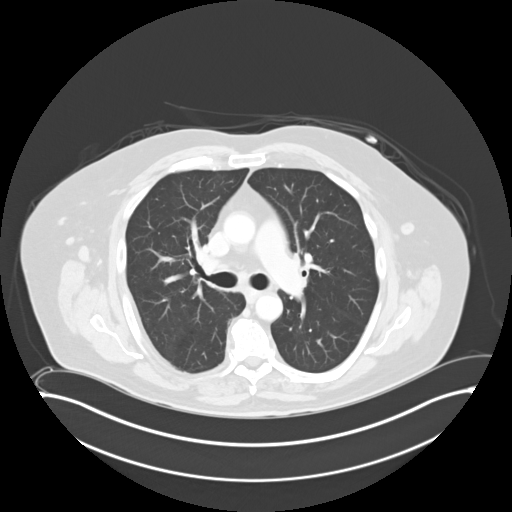
[im 131/145  lung]
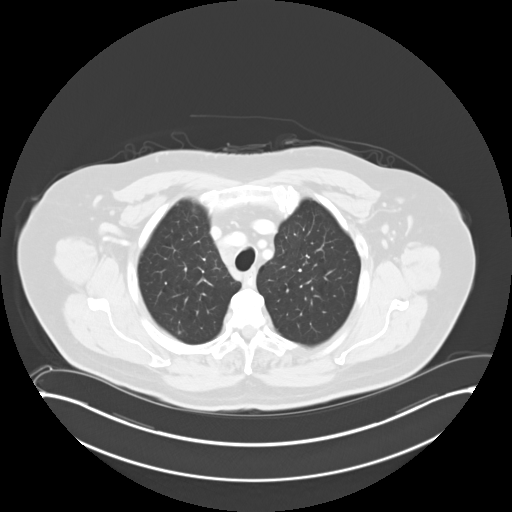

[Series 4: coronals cap · coronal · 0.81mm/px · 3 of 179 slices shown]
[im 36/179  lung]
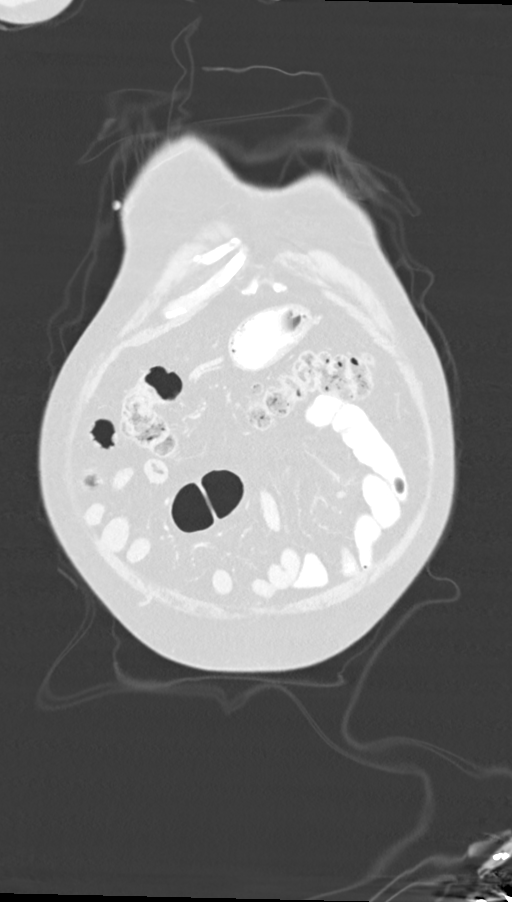
[im 72/179  lung]
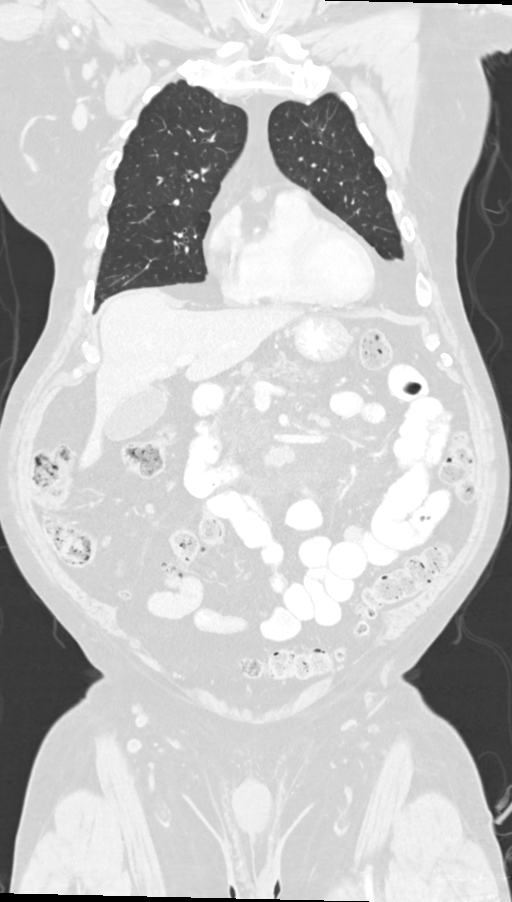
[im 107/179  lung]
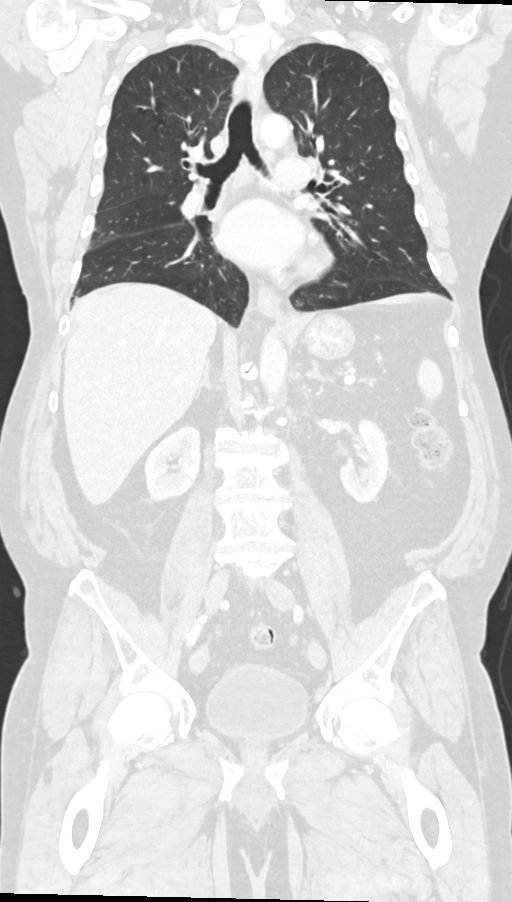

[11 of 36 positions shown; findings below may reference images not displayed]

FINDINGS: CT CHEST FINDINGS

Cardiovascular: Aortic atherosclerosis. Normal heart size. Scattered
three-vessel coronary artery calcifications. No pericardial
effusion.

Mediastinum/Nodes: There are numerous enlarged bilateral axillary
and mediastinal lymph nodes, the largest pretracheal lymph node
measuring 1.9 x 1.8 cm, slightly enlarged compared to prior
examination at which time it measured 1.5 x 1.5 cm (series 2, image
19). The largest right axillary node measures 1.8 x 1.2 cm, slightly
enlarged compared to prior examination at which time it measured
x 0.8 cm (series 2, image 20). Thyroid gland, trachea, and esophagus
demonstrate no significant findings.

Lungs/Pleura: Mild centrilobular emphysema. There has been interval
enlargement of a lobulated pulmonary nodule or adjacent clustered
nodules of the apical left upper lobe, now measuring 1.4 x 0.8 cm,
previously 1.0 x 0.7 cm when measured similarly (series 3, image
28). There are additional, occasional scattered ground-glass
opacities, for example in the posterior right upper lobe (series 3,
image 59) and mild bibasilar scarring. No pleural effusion or
pneumothorax.

Musculoskeletal: No chest wall mass or suspicious bone lesions
identified.

CT ABDOMEN PELVIS FINDINGS

Hepatobiliary: No solid liver abnormality is seen. No gallstones,
gallbladder wall thickening, or biliary dilatation.

Pancreas: Unremarkable. No pancreatic ductal dilatation or
surrounding inflammatory changes.

Spleen: Normal in size without significant abnormality.

Adrenals/Urinary Tract: Adrenal glands are unremarkable. Kidneys are
normal, without renal calculi, solid lesion, or hydronephrosis.
Bladder is unremarkable.

Stomach/Bowel: Stomach is within normal limits. Appendix appears
normal. No evidence of bowel wall thickening, distention, or
inflammatory changes. Descending and sigmoid diverticulosis.

Vascular/Lymphatic: Aortic atherosclerosis. There are numerous
prominent and enlarged retroperitoneal, iliac, and inguinal lymph
nodes, the largest left inguinal nodes enlarged measuring 1.0 x
cm, previously 7 mm (series 2, image 119). The largest right iliac
lymph node measures 2.1 x 1.2 cm, previously 1.9 by 1.0 cm (series
2, image 114). There are numerous enlarged small bowel mesenteric
lymph nodes, not significantly changed, the largest measuring 1.8 x
1.2 cm, previously 2.0 x 1.2 cm (series 2, image 68).

Reproductive: Prostatomegaly.

Other: No abdominal wall hernia or abnormality. No abdominopelvic
ascites.

Musculoskeletal: No acute or significant osseous findings.
IMPRESSION: 1. There are numerous enlarged bilateral axillary and mediastinal
lymph nodes, the largest pretracheal lymph node measuring 1.9 x
cm, slightly enlarged compared to prior examination at which time it
measured 1.5 x 1.5 cm (series 2, image 19). The largest right
axillary node measures 1.8 x 1.2 cm, slightly enlarged compared to
prior examination at which time it measured 1.3 x 0.8 cm (series 2,
image 20).

2. There are numerous prominent and enlarged retroperitoneal, iliac,
and inguinal lymph nodes, the largest left inguinal nodes enlarged
measuring 1.0 x 0.9 cm, previously 7 mm (series 2, image 119). The
largest right iliac lymph node measures 2.1 x 1.2 cm, previously
by 1.0 cm (series 2, image 114). There are numerous enlarged small
bowel mesenteric lymph nodes, not significantly changed, the largest
measuring 1.8 x 1.2 cm, previously 2.0 x 1.2 cm (series 2, image
68).

3. Overall findings are consistent with slight interval worsening of
lymphadenopathy.

4. There has been interval enlargement of a lobulated pulmonary
nodule or adjacent clustered nodules of the apical left upper lobe,
now measuring 1.4 x 0.8 cm, previously 1.0 x 0.7 cm when measured
similarly (series 3, image 28). This finding has clearly increased
in size on sequential prior examinations and is concerning for
malignancy, although atypical infection or inflammation may is a
differential consideration. Pulmonary involvement of CLL is
considered much less likely. PET-CT may be used to assess for FDG
avidity and tissue sampling can be considered.

5. Additional scattered nonspecific infectious or inflammatory
ground-glass opacities of the lungs.

6. Coronary artery disease. Aortic Atherosclerosis (BSIIS-0Q6.6) and
Emphysema ([id number].1).

## 2021-02-09 IMAGING — CT CT ABD-PELV W/ CM
2 of 6 series · 12 of 46 positions shown, 14 images · IV contrast (omnipaque)
Comparison: CT chest abdomen pelvis 01/21/2019, 07/21/2018, PET-CT,
04/10/2018

CLINICAL DATA: Follow-up CLL

EXAM:
CT CHEST, ABDOMEN, AND PELVIS WITH CONTRAST
TECHNIQUE: Multidetector CT imaging of the chest, abdomen and pelvis was
performed following the standard protocol during bolus
administration of intravenous contrast.
CONTRAST:  100mL OMNIPAQUE IOHEXOL 300 MG/ML SOLN, additional oral
enteric contrast

[Series 4: coronals cap · coronal · 0.81mm/px · 3 of 179 slices shown]
[im 60/179  soft-tissue]
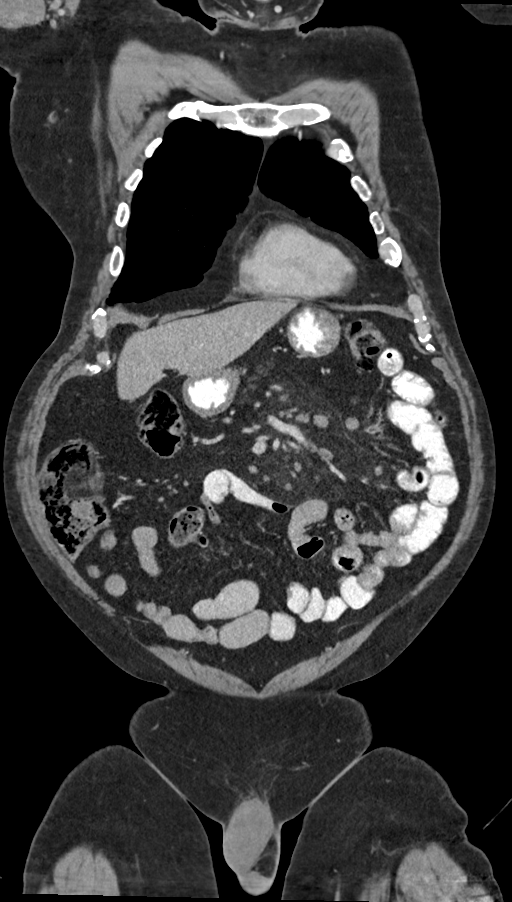
[im 80/179  soft-tissue]
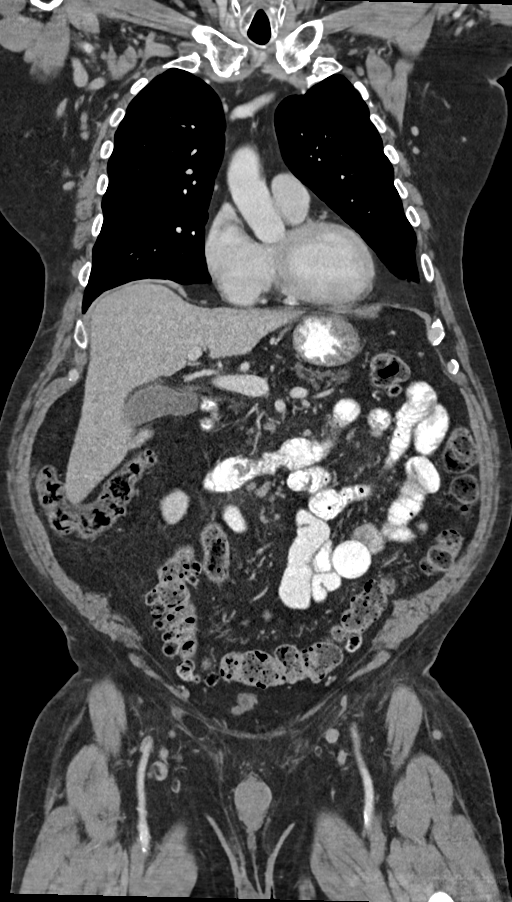
[im 99/179  soft-tissue]
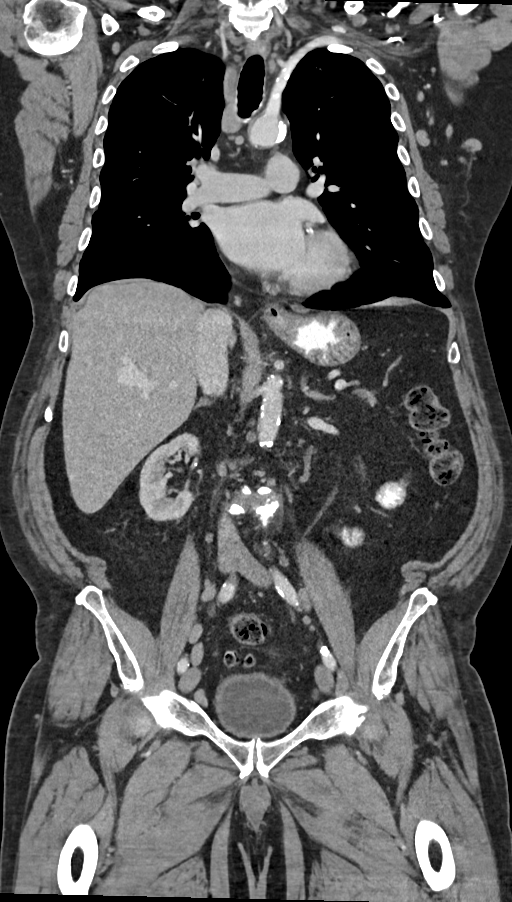

[Series 8: thins cap · axial · 0.81mm/px · z∈[-1527,-942]mm · 9 of 723 slices shown, 11 images]
[im 69/723  soft-tissue]
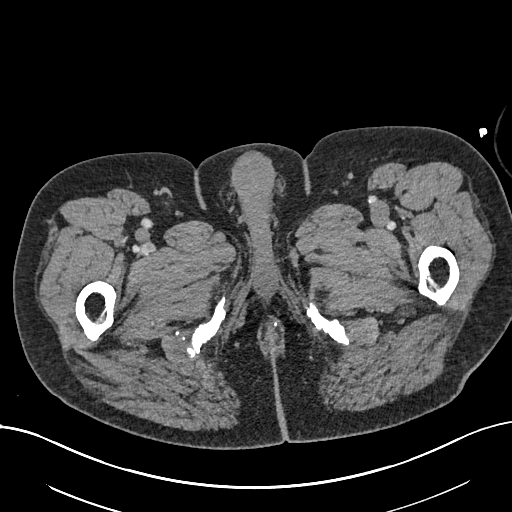
[im 69/723  bone]
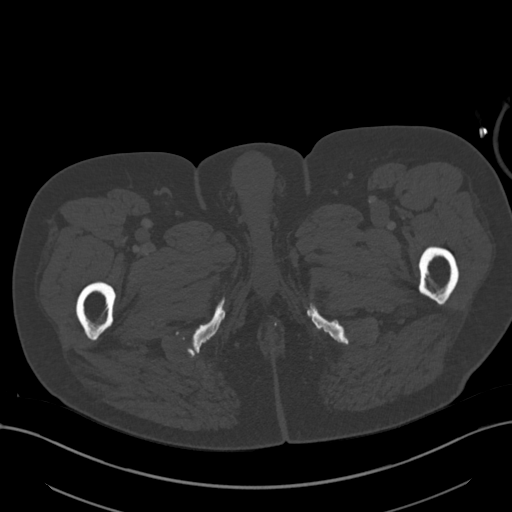
[im 138/723  soft-tissue]
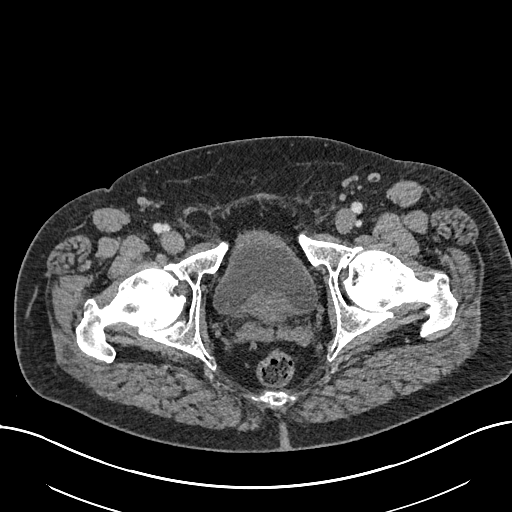
[im 207/723  soft-tissue]
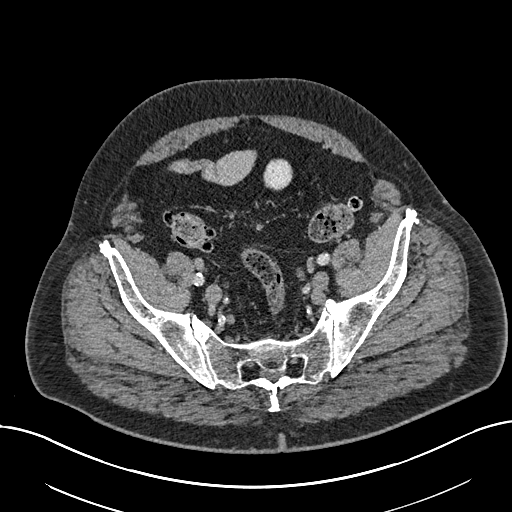
[im 276/723  soft-tissue]
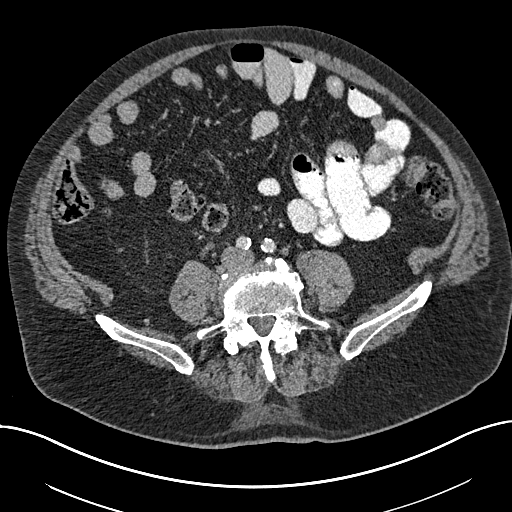
[im 379/723  soft-tissue]
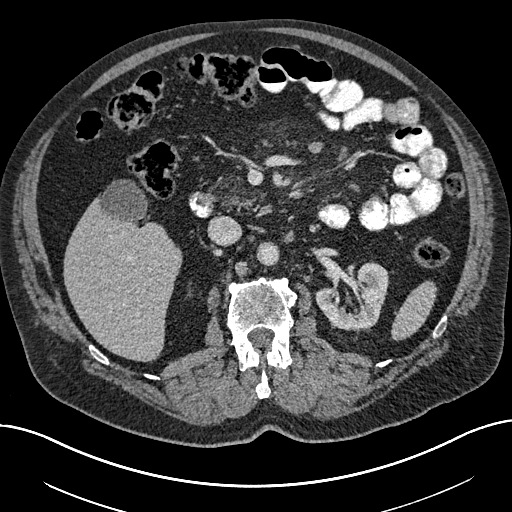
[im 447/723  soft-tissue]
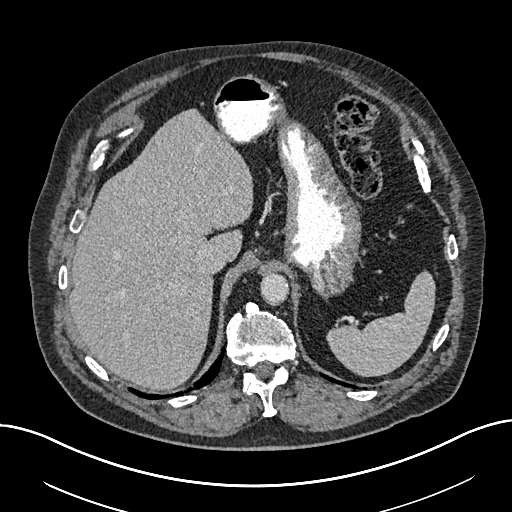
[im 516/723  soft-tissue]
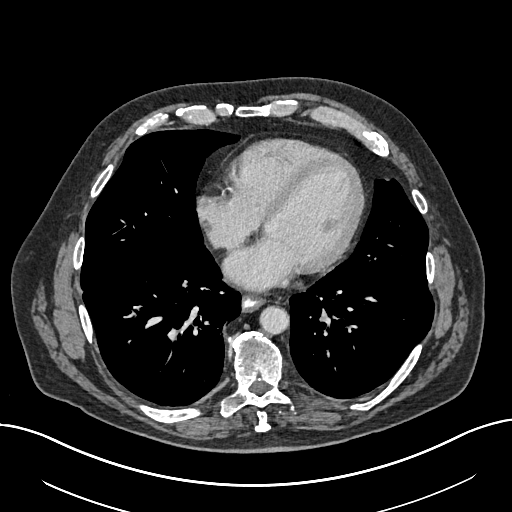
[im 585/723  soft-tissue]
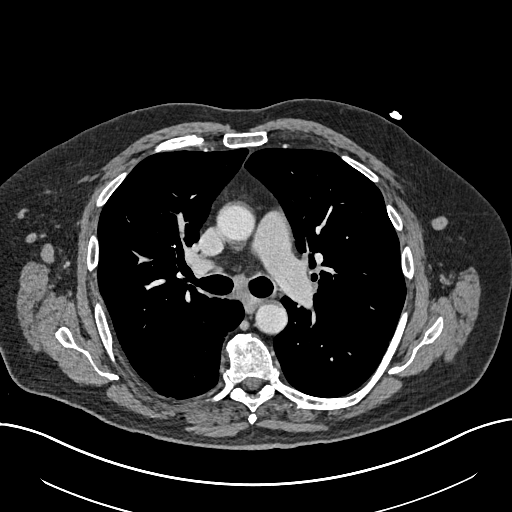
[im 654/723  soft-tissue]
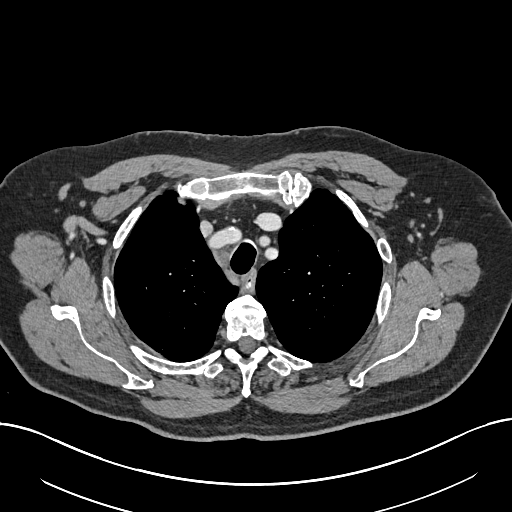
[im 654/723  bone]
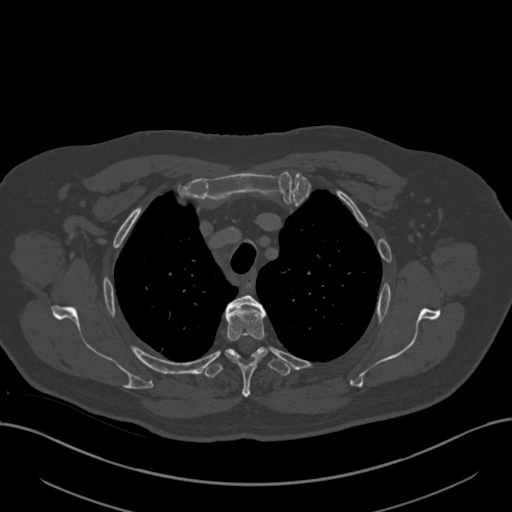

[12 of 46 positions shown; findings below may reference images not displayed]

FINDINGS: CT CHEST FINDINGS

Cardiovascular: Aortic atherosclerosis. Normal heart size. Scattered
three-vessel coronary artery calcifications. No pericardial
effusion.

Mediastinum/Nodes: There are numerous enlarged bilateral axillary
and mediastinal lymph nodes, the largest pretracheal lymph node
measuring 1.9 x 1.8 cm, slightly enlarged compared to prior
examination at which time it measured 1.5 x 1.5 cm (series 2, image
19). The largest right axillary node measures 1.8 x 1.2 cm, slightly
enlarged compared to prior examination at which time it measured
x 0.8 cm (series 2, image 20). Thyroid gland, trachea, and esophagus
demonstrate no significant findings.

Lungs/Pleura: Mild centrilobular emphysema. There has been interval
enlargement of a lobulated pulmonary nodule or adjacent clustered
nodules of the apical left upper lobe, now measuring 1.4 x 0.8 cm,
previously 1.0 x 0.7 cm when measured similarly (series 3, image
28). There are additional, occasional scattered ground-glass
opacities, for example in the posterior right upper lobe (series 3,
image 59) and mild bibasilar scarring. No pleural effusion or
pneumothorax.

Musculoskeletal: No chest wall mass or suspicious bone lesions
identified.

CT ABDOMEN PELVIS FINDINGS

Hepatobiliary: No solid liver abnormality is seen. No gallstones,
gallbladder wall thickening, or biliary dilatation.

Pancreas: Unremarkable. No pancreatic ductal dilatation or
surrounding inflammatory changes.

Spleen: Normal in size without significant abnormality.

Adrenals/Urinary Tract: Adrenal glands are unremarkable. Kidneys are
normal, without renal calculi, solid lesion, or hydronephrosis.
Bladder is unremarkable.

Stomach/Bowel: Stomach is within normal limits. Appendix appears
normal. No evidence of bowel wall thickening, distention, or
inflammatory changes. Descending and sigmoid diverticulosis.

Vascular/Lymphatic: Aortic atherosclerosis. There are numerous
prominent and enlarged retroperitoneal, iliac, and inguinal lymph
nodes, the largest left inguinal nodes enlarged measuring 1.0 x
cm, previously 7 mm (series 2, image 119). The largest right iliac
lymph node measures 2.1 x 1.2 cm, previously 1.9 by 1.0 cm (series
2, image 114). There are numerous enlarged small bowel mesenteric
lymph nodes, not significantly changed, the largest measuring 1.8 x
1.2 cm, previously 2.0 x 1.2 cm (series 2, image 68).

Reproductive: Prostatomegaly.

Other: No abdominal wall hernia or abnormality. No abdominopelvic
ascites.

Musculoskeletal: No acute or significant osseous findings.
IMPRESSION: 1. There are numerous enlarged bilateral axillary and mediastinal
lymph nodes, the largest pretracheal lymph node measuring 1.9 x
cm, slightly enlarged compared to prior examination at which time it
measured 1.5 x 1.5 cm (series 2, image 19). The largest right
axillary node measures 1.8 x 1.2 cm, slightly enlarged compared to
prior examination at which time it measured 1.3 x 0.8 cm (series 2,
image 20).

2. There are numerous prominent and enlarged retroperitoneal, iliac,
and inguinal lymph nodes, the largest left inguinal nodes enlarged
measuring 1.0 x 0.9 cm, previously 7 mm (series 2, image 119). The
largest right iliac lymph node measures 2.1 x 1.2 cm, previously
by 1.0 cm (series 2, image 114). There are numerous enlarged small
bowel mesenteric lymph nodes, not significantly changed, the largest
measuring 1.8 x 1.2 cm, previously 2.0 x 1.2 cm (series 2, image
68).

3. Overall findings are consistent with slight interval worsening of
lymphadenopathy.

4. There has been interval enlargement of a lobulated pulmonary
nodule or adjacent clustered nodules of the apical left upper lobe,
now measuring 1.4 x 0.8 cm, previously 1.0 x 0.7 cm when measured
similarly (series 3, image 28). This finding has clearly increased
in size on sequential prior examinations and is concerning for
malignancy, although atypical infection or inflammation may is a
differential consideration. Pulmonary involvement of CLL is
considered much less likely. PET-CT may be used to assess for FDG
avidity and tissue sampling can be considered.

5. Additional scattered nonspecific infectious or inflammatory
ground-glass opacities of the lungs.

6. Coronary artery disease. Aortic Atherosclerosis ([id number]) and
Emphysema ([id number].1).

## 2021-02-14 DIAGNOSIS — J449 Chronic obstructive pulmonary disease, unspecified: Secondary | ICD-10-CM | POA: Diagnosis not present

## 2021-02-14 DIAGNOSIS — J42 Unspecified chronic bronchitis: Secondary | ICD-10-CM | POA: Diagnosis not present

## 2021-02-20 DIAGNOSIS — I4892 Unspecified atrial flutter: Secondary | ICD-10-CM | POA: Diagnosis not present

## 2021-03-09 DIAGNOSIS — D2262 Melanocytic nevi of left upper limb, including shoulder: Secondary | ICD-10-CM | POA: Diagnosis not present

## 2021-03-09 DIAGNOSIS — D2261 Melanocytic nevi of right upper limb, including shoulder: Secondary | ICD-10-CM | POA: Diagnosis not present

## 2021-03-09 DIAGNOSIS — D225 Melanocytic nevi of trunk: Secondary | ICD-10-CM | POA: Diagnosis not present

## 2021-03-09 DIAGNOSIS — L298 Other pruritus: Secondary | ICD-10-CM | POA: Diagnosis not present

## 2021-03-09 DIAGNOSIS — L821 Other seborrheic keratosis: Secondary | ICD-10-CM | POA: Diagnosis not present

## 2021-03-14 DIAGNOSIS — I4892 Unspecified atrial flutter: Secondary | ICD-10-CM | POA: Diagnosis not present

## 2021-03-21 DIAGNOSIS — I10 Essential (primary) hypertension: Secondary | ICD-10-CM | POA: Diagnosis not present

## 2021-03-21 DIAGNOSIS — I6523 Occlusion and stenosis of bilateral carotid arteries: Secondary | ICD-10-CM | POA: Diagnosis not present

## 2021-03-21 DIAGNOSIS — I2510 Atherosclerotic heart disease of native coronary artery without angina pectoris: Secondary | ICD-10-CM | POA: Diagnosis not present

## 2021-03-21 DIAGNOSIS — I4892 Unspecified atrial flutter: Secondary | ICD-10-CM | POA: Diagnosis not present

## 2021-05-25 ENCOUNTER — Telehealth: Payer: Self-pay

## 2021-05-25 ENCOUNTER — Ambulatory Visit: Payer: Medicare HMO

## 2021-05-25 NOTE — Telephone Encounter (Signed)
Unable to reach patient for scheduled visit. No  answer. Left message to reschedule appointment.

## 2021-05-31 ENCOUNTER — Ambulatory Visit (INDEPENDENT_AMBULATORY_CARE_PROVIDER_SITE_OTHER): Payer: Medicare HMO | Admitting: Internal Medicine

## 2021-05-31 ENCOUNTER — Other Ambulatory Visit: Payer: Self-pay

## 2021-05-31 ENCOUNTER — Encounter: Payer: Self-pay | Admitting: Internal Medicine

## 2021-05-31 VITALS — BP 140/82 | HR 72 | Temp 97.5°F | Ht 68.0 in | Wt 218.4 lb

## 2021-05-31 DIAGNOSIS — S91109A Unspecified open wound of unspecified toe(s) without damage to nail, initial encounter: Secondary | ICD-10-CM | POA: Diagnosis not present

## 2021-05-31 DIAGNOSIS — R42 Dizziness and giddiness: Secondary | ICD-10-CM | POA: Diagnosis not present

## 2021-05-31 DIAGNOSIS — E1159 Type 2 diabetes mellitus with other circulatory complications: Secondary | ICD-10-CM

## 2021-05-31 DIAGNOSIS — E119 Type 2 diabetes mellitus without complications: Secondary | ICD-10-CM

## 2021-05-31 DIAGNOSIS — T148XXA Other injury of unspecified body region, initial encounter: Secondary | ICD-10-CM | POA: Diagnosis not present

## 2021-05-31 DIAGNOSIS — Z113 Encounter for screening for infections with a predominantly sexual mode of transmission: Secondary | ICD-10-CM

## 2021-05-31 DIAGNOSIS — R269 Unspecified abnormalities of gait and mobility: Secondary | ICD-10-CM

## 2021-05-31 DIAGNOSIS — Z1329 Encounter for screening for other suspected endocrine disorder: Secondary | ICD-10-CM | POA: Diagnosis not present

## 2021-05-31 DIAGNOSIS — Z9181 History of falling: Secondary | ICD-10-CM

## 2021-05-31 DIAGNOSIS — Z0189 Encounter for other specified special examinations: Secondary | ICD-10-CM

## 2021-05-31 DIAGNOSIS — I152 Hypertension secondary to endocrine disorders: Secondary | ICD-10-CM

## 2021-05-31 DIAGNOSIS — I6529 Occlusion and stenosis of unspecified carotid artery: Secondary | ICD-10-CM

## 2021-05-31 DIAGNOSIS — I4892 Unspecified atrial flutter: Secondary | ICD-10-CM | POA: Diagnosis not present

## 2021-05-31 MED ORDER — MUPIROCIN 2 % EX OINT: 1.0000 "application " | TOPICAL_OINTMENT | Freq: Two times a day (BID) | CUTANEOUS | 1 refills | Status: DC

## 2021-05-31 MED ORDER — APIXABAN 5 MG PO TABS: 5.0000 mg | ORAL_TABLET | Freq: Two times a day (BID) | ORAL | Status: DC

## 2021-05-31 MED ORDER — APIXABAN 2.5 MG PO TABS: 2.5000 mg | ORAL_TABLET | Freq: Two times a day (BID) | ORAL | Status: AC

## 2021-05-31 NOTE — Patient Instructions (Addendum)
You need a driving test through the [place] Always use your cane and walKer !!!! We ordered PT/OT at home  Doing a CT brain scan today   Let me know if right hip Xray wanted      Consider 4th covid 19 dose [facility], [facility], [facility], [facility], [facility]  [employer].   Aspercream with lidocaine left hip  Bactroban wounds  Dove antibacterial soap  Bactine max spray antiseptic and lidocaine for wounds   Compare you med list at home with ours and call us back if there are changes

## 2021-06-01 ENCOUNTER — Encounter: Payer: Self-pay | Admitting: Internal Medicine

## 2021-06-01 ENCOUNTER — Ambulatory Visit
Admission: RE | Admit: 2021-06-01 | Discharge: 2021-06-01 | Disposition: A | Discharge status: Home / Self Care | Payer: Medicare HMO | Source: Ambulatory Visit | Attending: Internal Medicine | Admitting: Internal Medicine

## 2021-06-01 ENCOUNTER — Telehealth: Payer: Self-pay | Admitting: Internal Medicine

## 2021-06-01 ENCOUNTER — Other Ambulatory Visit (INDEPENDENT_AMBULATORY_CARE_PROVIDER_SITE_OTHER): Payer: Medicare HMO

## 2021-06-01 DIAGNOSIS — R269 Unspecified abnormalities of gait and mobility: Secondary | ICD-10-CM | POA: Diagnosis not present

## 2021-06-01 DIAGNOSIS — E1159 Type 2 diabetes mellitus with other circulatory complications: Secondary | ICD-10-CM | POA: Diagnosis not present

## 2021-06-01 DIAGNOSIS — I152 Hypertension secondary to endocrine disorders: Secondary | ICD-10-CM | POA: Diagnosis not present

## 2021-06-01 DIAGNOSIS — I4892 Unspecified atrial flutter: Secondary | ICD-10-CM

## 2021-06-01 DIAGNOSIS — I6381 Other cerebral infarction due to occlusion or stenosis of small artery: Secondary | ICD-10-CM | POA: Diagnosis not present

## 2021-06-01 DIAGNOSIS — Z1329 Encounter for screening for other suspected endocrine disorder: Secondary | ICD-10-CM

## 2021-06-01 DIAGNOSIS — R42 Dizziness and giddiness: Secondary | ICD-10-CM | POA: Insufficient documentation

## 2021-06-01 DIAGNOSIS — I6782 Cerebral ischemia: Secondary | ICD-10-CM | POA: Diagnosis not present

## 2021-06-01 DIAGNOSIS — I672 Cerebral atherosclerosis: Secondary | ICD-10-CM | POA: Insufficient documentation

## 2021-06-01 LAB — COMPREHENSIVE METABOLIC PANEL
ALT: 15 U/L (ref 0–53)
AST: 15 U/L (ref 0–37)
Albumin: 4.1 g/dL (ref 3.5–5.2)
Alkaline Phosphatase: 79 U/L (ref 39–117)
BUN: 35 mg/dL — ABNORMAL HIGH (ref 6–23)
CO2: 20 mEq/L (ref 19–32)
Calcium: 9.4 mg/dL (ref 8.4–10.5)
Chloride: 109 mEq/L (ref 96–112)
Creatinine, Ser: 1.53 mg/dL — ABNORMAL HIGH (ref 0.40–1.50)
GFR: 40.91 mL/min — ABNORMAL LOW (ref >60.00)
Glucose, Bld: 99 mg/dL (ref 70–99)
Potassium: 4.5 mEq/L (ref 3.5–5.1)
Sodium: 140 mEq/L (ref 135–145)
Total Bilirubin: 0.4 mg/dL (ref 0.2–1.2)
Total Protein: 6.5 g/dL (ref 6.0–8.3)

## 2021-06-01 LAB — CBC WITH DIFFERENTIAL/PLATELET
Basophils Absolute: 0.1 10*3/uL (ref 0.0–0.1)
Basophils Relative: 0.5 % (ref 0.0–3.0)
Eosinophils Absolute: 0.4 10*3/uL (ref 0.0–0.7)
Eosinophils Relative: 4.3 % (ref 0.0–5.0)
HCT: 32.1 % — ABNORMAL LOW (ref 39.0–52.0)
Hemoglobin: 10.5 g/dL — ABNORMAL LOW (ref 13.0–17.0)
Lymphocytes Relative: 42.5 % (ref 12.0–46.0)
Lymphs Abs: 4.3 10*3/uL — ABNORMAL HIGH (ref 0.7–4.0)
MCHC: 32.8 g/dL (ref 30.0–36.0)
MCV: 86.9 fl (ref 78.0–100.0)
Monocytes Absolute: 0.9 10*3/uL (ref 0.1–1.0)
Monocytes Relative: 9.4 % (ref 3.0–12.0)
Neutro Abs: 4.3 10*3/uL (ref 1.4–7.7)
Neutrophils Relative %: 43.3 % (ref 43.0–77.0)
Platelets: 167 10*3/uL (ref 150.0–400.0)
RBC: 3.69 Mil/uL — ABNORMAL LOW (ref 4.22–5.81)
RDW: 17 % — ABNORMAL HIGH (ref 11.5–15.5)
WBC: 10 10*3/uL (ref 4.0–10.5)

## 2021-06-01 LAB — TSH: TSH: 3.25 u[IU]/mL (ref 0.35–5.50)

## 2021-06-01 LAB — LIPID PANEL
Cholesterol: 133 mg/dL (ref 0–200)
HDL: 39 mg/dL — ABNORMAL LOW (ref >39.00)
LDL Cholesterol: 63 mg/dL (ref 0–99)
NonHDL: 93.57
Total CHOL/HDL Ratio: 3
Triglycerides: 151 mg/dL — ABNORMAL HIGH (ref 0.0–149.0)
VLDL: 30.2 mg/dL (ref 0.0–40.0)

## 2021-06-01 LAB — HEMOGLOBIN A1C: Hgb A1c MFr Bld: 6.8 % — ABNORMAL HIGH (ref 4.6–6.5)

## 2021-06-01 NOTE — Telephone Encounter (Addendum)
Patient was brought in by another Patient who found him on the ground outside of the building, Patient was aided in by [place] and Dr Nicki Reaper around 7:15 am.   Patient was coming in for his lab appointment. When trying to come up on the curb Patient's foot was caught and he fell. Patient reports that he did not hit his head.   Patient's blood pressure, blood sugar, heart rate, and oxygen levels within normal range at time of the fall. Patient states no dizziness at the time before or after the fall, no pain reported other than the right elbow and forearm. There were some bleeding scrapes on this area that were cleaned and dressed with non adherent pads then wrapped with Coban.   Patient states that the other Patient that helped him in the building got some of his blood on them but had no open wounds for the blood to contaminate.   Patient was evaluated by Dr Nicki Reaper and Dr Olivia Mackie McLean-Scocuzza. Patient had labs done and a STAT CT of the head ordered. Patient was kept in the building and observed for 2 hours. Patient left the building around 9:00 am. Patient remained in wheelchair for remainder of time in office. Patient was wheeled by myself to the front door and then walked to his car. Patient states once again no dizziness, blurred vision, lightheadedness, or pain at time of departure. Patient insisted he was okay to drive home.

## 2021-06-01 NOTE — Progress Notes (Addendum)
Chief Complaint  Patient presents with   Follow-up   F/u visit 05/31/21 and seen 06/01/21 after fall in am 7:30 in hte parking lot 1. Fall after visit 06/01/21 in parking lot with right hip pain, dizziness intermittently, abrasions mulitiple with h/o falls with abrasions left leg and b/l toes  H/o Aflutter r/o stroke having balance issues  Pending driving test [place], CT scan today 06/01/21 r/o stroke  Did not have walker or cane which should be walking with  Vitamins 136/72 99% HR 71, glucose 98 fasting  Of note also had a fall w/in the last 2 days with abrasion left lower leg and toes noted 05/31/21 visit after fall 06/01/21 visit had abrasion right forearm, right lower leg and right hip pain declined Xray right hip   2. Dm 2 sees my eye doctor, refer to podiatry ulcer left great toe [facility] podiatry  3. H/o lung nodules/lesions and growth in throat to stomach had removed per pt need to get records [facility] had 10 tx's per pt need the details  4. Atrial flutter on eliquis ? 2.5 vs 5 mg bid on lopressor 50 mg bid clarity htn and cards meds with [facility] cards Dr. Raliegh Ip   Review of Systems  Constitutional:  Negative for weight loss.  HENT:  Negative for hearing loss.   Eyes:  Negative for blurred vision.  Respiratory:  Negative for shortness of breath.   Cardiovascular:  Negative for chest pain.  Musculoskeletal:  Positive for falls and joint pain.  Skin:  Negative for rash.  Neurological:  Positive for dizziness.       Falls  Psychiatric/Behavioral:  Positive for memory loss.   Past Medical History:  Diagnosis Date   Anemia    Arthritis    BPH (benign prostatic hyperplasia)    Cancer (HCC)    Lung   COPD (chronic obstructive pulmonary disease) (HCC)    Emphysema of lung (HCC)    Emphysema of lung (HCC)    Esophageal reflux    Hyperlipidemia    Hypertension    Internal carotid artery stenosis, right    Neuropathy of both feet    Pneumonia    recurrent   PTSD (post-traumatic stress disorder)     [place] vet   Sebaceous cyst    SOB (shortness of breath) on exertion    TIA (transient ischemic attack)    Urge incontinence    Past Surgical History:  Procedure Laterality Date   CYST EXCISION     buttocks   ELECTROMAGNETIC NAVIGATION BROCHOSCOPY Left 08/23/2019   Procedure: ELECTROMAGNETIC NAVIGATION BRONCHOSCOPY;  Surgeon: Tyler Pita, MD;  Location: [facility] ORS;  Service: Cardiopulmonary;  Laterality: Left;   ENDOBRONCHIAL ULTRASOUND N/A 08/23/2019   Procedure: ENDOBRONCHIAL ULTRASOUND;  Surgeon: Tyler Pita, MD;  Location: [facility] ORS;  Service: Cardiopulmonary;  Laterality: N/A;   HEMORRHOID SURGERY     shave biopsy  03/13/2020   left neck 03/13/20 [facility] wart with atypica inflamed +AK no carcinoma    SINUSOTOMY     VIDEO BRONCHOSCOPY WITH ENDOBRONCHIAL NAVIGATION Left 06/26/2020   Procedure: VIDEO BRONCHOSCOPY WITH ENDOBRONCHIAL NAVIGATION;  Surgeon: Tyler Pita, MD;  Location: [facility] ORS;  Service: Pulmonary;  Laterality: Left;   Family History  Problem Relation Age of Onset   Heart disease Mother    Cancer Maternal Aunt        lung   Lung cancer Maternal Aunt    Leukemia Maternal Aunt    Hypertension Son  Social History   Socioeconomic History   Marital status: Married    Spouse name: Kathreen Cornfield   Number of children: 2   Years of education: Not on file   Highest education level: Not on file  Occupational History   Not on file  Tobacco Use   Smoking status: Former    Packs/day: 1.50    Years: 50.00    Pack years: 75.00    Types: Cigarettes    Quit date: 09/10/1999    Years since quitting: 21.7   Smokeless tobacco: Never  Vaping Use   Vaping Use: Never used  Substance and Sexual Activity   Alcohol use: No    Alcohol/week: 0.0 standard drinks    Comment: not since 12/1980   Drug use: No   Sexual activity: Yes  Other Topics Concern   Not on file  Social History Narrative   Lives in [place] with wife. Dog and cat in  home.      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  Dietitian.      Diet - regular      Exercise - Walking   Social Determinants of Health   Financial Resource Strain: Not on file  Food Insecurity: Not on file  Transportation Needs: Not on file  Physical Activity: Not on file  Stress: Not on file  Social Connections: Not on file  Intimate Partner Violence: Not on file   Current Meds  Medication Sig   albuterol (VENTOLIN HFA) 108 (90 Base) MCG/ACT inhaler Inhale 2 puffs into the lungs every 6 (six) hours as needed for wheezing or shortness of breath.   amLODipine (NORVASC) 5 MG tablet Take 1 tablet (5 mg total) by mouth 2 (two) times daily. (Patient not taking: Reported on 06/04/2021)   atorvastatin (LIPITOR) 80 MG tablet Take 40 mg by mouth daily.   cholecalciferol (VITAMIN D) 1000 UNITS tablet Take 1,000 Units by mouth 2 (two) times daily.    famotidine (PEPCID) 20 MG tablet Take 1 tablet (20 mg total) by mouth daily as needed for heartburn or indigestion.   gabapentin (NEURONTIN) 300 MG capsule Take 1 capsule (300 mg total) by mouth at bedtime. (Patient not taking: Reported on 06/04/2021)   glucose blood test strip 1 each by Other route as needed. Use as instructed (Patient not taking: Reported on 06/04/2021)   hydrochlorothiazide (HYDRODIURIL) 25 MG tablet Take 0.5 tablets (12.5 mg total) by mouth daily. (Patient not taking: Reported on 06/04/2021)   metoprolol tartrate (LOPRESSOR) 50 MG tablet Take 1 tablet by mouth 2 (two) times daily.   Multiple Vitamins-Minerals (MEGA MULTIVITAMIN FOR MEN) TABS Take 1 tablet by mouth daily.    mupirocin ointment (BACTROBAN) 2 % Apply 1 application topically 2 (two) times daily. Prn left leg   [DISCONTINUED] apixaban (ELIQUIS) 2.5 MG TABS tablet Take by mouth.   [DISCONTINUED] DULoxetine (CYMBALTA) 20 MG capsule Take 40 mg by mouth daily.   [DISCONTINUED] lisinopril (ZESTRIL) 10 MG tablet Take 1 tablet (10 mg total) by mouth daily.    [DISCONTINUED] omeprazole (PRILOSEC) 40 MG capsule Take 1 capsule (40 mg total) by mouth daily.   Allergies  Allergen Reactions   Cephalexin Rash   Recent Results (from the past 2160 hour(s))  TSH     Status: None   Collection Time: 06/01/21  7:59 AM  Result Value Ref Range   TSH 3.25 0.35 - 5.50 uIU/mL  CBC with Differential/Platelet     Status: Abnormal   Collection Time: 06/01/21  7:59 AM  Result Value Ref Range   WBC 10.0 4.0 - 10.5 K/uL   RBC 3.69 (L) 4.22 - 5.81 Mil/uL   Hemoglobin 10.5 (L) 13.0 - 17.0 g/dL   HCT 32.1 (L) 39.0 - 52.0 %   MCV 86.9 78.0 - 100.0 fl   MCHC 32.8 30.0 - 36.0 g/dL   RDW 17.0 (H) 11.5 - 15.5 %   Platelets 167.0 150.0 - 400.0 K/uL   Neutrophils Relative % 43.3 43.0 - 77.0 %   Lymphocytes Relative 42.5 12.0 - 46.0 %   Monocytes Relative 9.4 3.0 - 12.0 %   Eosinophils Relative 4.3 0.0 - 5.0 %   Basophils Relative 0.5 0.0 - 3.0 %   Neutro Abs 4.3 1.4 - 7.7 K/uL   Lymphs Abs 4.3 (H) 0.7 - 4.0 K/uL   Monocytes Absolute 0.9 0.1 - 1.0 K/uL   Eosinophils Absolute 0.4 0.0 - 0.7 K/uL   Basophils Absolute 0.1 0.0 - 0.1 K/uL  Hemoglobin A1c     Status: Abnormal   Collection Time: 06/01/21  7:59 AM  Result Value Ref Range   Hgb A1c MFr Bld 6.8 (H) 4.6 - 6.5 %    Comment: Glycemic Control Guidelines for People with Diabetes:Non Diabetic:  <6%Goal of Therapy: <7%Additional Action Suggested:  >8%   Lipid panel     Status: Abnormal   Collection Time: 06/01/21  7:59 AM  Result Value Ref Range   Cholesterol 133 0 - 200 mg/dL    Comment: ATP III Classification       Desirable:  < 200 mg/dL               Borderline High:  200 - 239 mg/dL          High:  > = 240 mg/dL   Triglycerides 151.0 (H) 0.0 - 149.0 mg/dL    Comment: Normal:  <150 mg/dLBorderline High:  150 - 199 mg/dL   HDL 39.00 (L) >39.00 mg/dL   VLDL 30.2 0.0 - 40.0 mg/dL   LDL Cholesterol 63 0 - 99 mg/dL   Total CHOL/HDL Ratio 3     Comment:                Men          Women1/2 Average Risk     3.4           3.3Average Risk          5.0          4.42X Average Risk          9.6          7.13X Average Risk          15.0          11.0                       NonHDL 93.57     Comment: NOTE:  Non-HDL goal should be 30 mg/dL higher than patient's LDL goal (i.e. LDL goal of < 70 mg/dL, would have non-HDL goal of < 100 mg/dL)  Comprehensive metabolic panel     Status: Abnormal   Collection Time: 06/01/21  7:59 AM  Result Value Ref Range   Sodium 140 135 - 145 mEq/L   Potassium 4.5 3.5 - 5.1 mEq/L   Chloride 109 96 - 112 mEq/L   CO2 20 19 - 32 mEq/L   Glucose, Bld 99 70 - 99 mg/dL   BUN 35 (H)  6 - 23 mg/dL   Creatinine, Ser 1.53 (H) 0.40 - 1.50 mg/dL   Total Bilirubin 0.4 0.2 - 1.2 mg/dL   Alkaline Phosphatase 79 39 - 117 U/L   AST 15 0 - 37 U/L   ALT 15 0 - 53 U/L   Total Protein 6.5 6.0 - 8.3 g/dL   Albumin 4.1 3.5 - 5.2 g/dL   GFR 40.91 (L) >60.00 mL/min    Comment: Calculated using the CKD-EPI Creatinine Equation (2021)   Calcium 9.4 8.4 - 10.5 mg/dL  Urine Microalbumin w/creat. ratio     Status: Abnormal   Collection Time: 06/04/21 10:09 AM  Result Value Ref Range   Creatinine, Urine 77 20 - 320 mg/dL   Microalb, Ur 7.0 mg/dL    Comment: Reference Range Not established    Microalb Creat Ratio 91 (H) <30 mcg/mg creat    Comment: . The ADA defines abnormalities in albumin excretion as follows: Marland Kitchen Albuminuria Category        Result (mcg/mg creatinine) . Normal to Mildly increased   <30 Moderately increased         30-299  Severely increased           > OR = 300 . The ADA recommends that at least two of three specimens collected within a 3-6 month period be abnormal before considering a patient to be within a diagnostic category.   Urinalysis, Routine w reflex microscopic     Status: Abnormal   Collection Time: 06/04/21 10:09 AM  Result Value Ref Range   Color, Urine YELLOW YELLOW   APPearance CLEAR CLEAR   Specific Gravity, Urine 1.013 1.001 - 1.035   pH < OR = 5.0 5.0 -  8.0   Glucose, UA NEGATIVE NEGATIVE   Bilirubin Urine NEGATIVE NEGATIVE   Ketones, ur NEGATIVE NEGATIVE   Hgb urine dipstick NEGATIVE NEGATIVE   Protein, ur TRACE (A) NEGATIVE   Nitrite NEGATIVE NEGATIVE   Leukocytes,Ua NEGATIVE NEGATIVE   WBC, UA 0-5 0 - 5 /HPF   RBC / HPF 0-2 0 - 2 /HPF   Squamous Epithelial / LPF NONE SEEN < OR = 5 /HPF   Bacteria, UA NONE SEEN NONE SEEN /HPF   Calcium Oxalate Crystal MODERATE (A) NONE OR FEW /HPF   AMORPHOUS SEDIMENT FEW NONE OR FEW /HPF   Hyaline Cast NONE SEEN NONE SEEN /LPF   Objective  Body mass index is 33.21 kg/m. Wt Readings from Last 3 Encounters:  05/31/21 218 lb 6.4 oz (99.1 kg)  10/31/20 224 lb (101.6 kg)  08/03/20 230 lb (104.3 kg)   Temp Readings from Last 3 Encounters:  05/31/21 (!) 97.5 F (36.4 C) (Oral)  08/03/20 98 F (36.7 C) (Temporal)  07/12/20 (!) 96.6 F (35.9 C) (Tympanic)   BP Readings from Last 3 Encounters:  05/31/21 140/82  10/31/20 (!) 138/55  08/03/20 (!) 148/58   Pulse Readings from Last 3 Encounters:  05/31/21 72  10/31/20 95  08/03/20 81    Physical Exam Vitals and nursing note reviewed.  Constitutional:      Appearance: Normal appearance. He is well-developed and well-groomed. He is obese.  HENT:     Head: Normocephalic and atraumatic.  Eyes:     Conjunctiva/sclera: Conjunctivae normal.     Pupils: Pupils are equal, round, and reactive to light.  Cardiovascular:     Rate and Rhythm: Normal rate and regular rhythm.     Heart sounds: Normal heart sounds.  Pulmonary:  Effort: No respiratory distress.  Skin:    General: Skin is warm and moist.       Neurological:     General: No focal deficit present.     Mental Status: He is alert and oriented to person, place, and time. Mental status is at baseline.     Gait: Gait normal.  Psychiatric:        Attention and Perception: Attention and perception normal.        Mood and Affect: Mood and affect normal.        Speech: Speech normal.         Behavior: Behavior normal. Behavior is cooperative.        Thought Content: Thought content normal.        Cognition and Memory: Cognition and memory normal.        Judgment: Judgment normal.   Assessment  Plan  Atrial flutter, unspecified type ([place]) - Plan: apixaban (ELIQUIS) 2.5 MG TABS tablet bid vs 5 mg bid Comprehensive metabolic panel, Lipid panel, Clarify meds with cards for Aflutter and htn Dr. Shelba Flake cards  Open wound - Plan: mupirocin ointment (BACTROBAN) 2 %  Hypertension associated with diabetes ([place]) - Plan: Comprehensive metabolic panel, Lipid panel, Hemoglobin A1c, CBC with Differential/Platelet, Microalbumin / creatinine urine ratio, Urinalysis, Routine w reflex microscopic Not on meds dm diet controlled Hctz 25 mg qd, lis 10 mg qd, norvasc 5 mg qd, bb metop 50 mg bid  Confirm meds with cards  Alos trosemide 20 mg  Pt unsure of meds Records my eye doct  Encounter for diabetic foot exam ([address]) - Plan: Ambulatory referral to Podiatry Open wound of toe, initial encounter - Plan: Ambulatory referral to Podiatry  Dizziness - Plan: Ambulatory referral to [place], CT Head Wo Contrast, CANCELED: CT Head Wo Contrast Abnormal gait - Plan: Ambulatory referral to [place], CT Head Wo Contrast, CANCELED: CT Head Wo Contrast at high risk for falls  He needs a driving test asap per pt has via [place] always walk walker or cane  Xray right hip in future if having pain after fall 06/01/21 was at the clinic for labs only and fell  HM  Provider: Dr. Olivia Mackie McLean-Scocuzza-Internal Medicine

## 2021-06-01 NOTE — Telephone Encounter (Signed)
See detailed note below regarding fall in the parking lot. Patient was assisted to a wheelchair and brought into office to be evaluated and clean/dress wound.
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
## 2021-06-01 NOTE — Telephone Encounter (Signed)
F/u 1. Fall after visit 06/01/21 in parking lot with right hip pain, dizziness intermittently, abrasions mulitiple with h/o falls with abrasions left leg and b/l toes H/o Aflutter r/o stroke having balance issues Pending driving test [place], CT scan today r/o stroke Did not have walker or cane Vitals 136/72 99% HR 71, glucose 98 fasting  -Dr Olivia Mackie McLean-Scocuzza.

## 2021-06-04 ENCOUNTER — Other Ambulatory Visit: Payer: Self-pay

## 2021-06-04 ENCOUNTER — Telehealth: Payer: Self-pay

## 2021-06-04 ENCOUNTER — Other Ambulatory Visit (INDEPENDENT_AMBULATORY_CARE_PROVIDER_SITE_OTHER): Payer: Medicare HMO

## 2021-06-04 DIAGNOSIS — Z1329 Encounter for screening for other suspected endocrine disorder: Secondary | ICD-10-CM | POA: Diagnosis not present

## 2021-06-04 DIAGNOSIS — I152 Hypertension secondary to endocrine disorders: Secondary | ICD-10-CM

## 2021-06-04 DIAGNOSIS — E1159 Type 2 diabetes mellitus with other circulatory complications: Secondary | ICD-10-CM | POA: Diagnosis not present

## 2021-06-04 DIAGNOSIS — I4892 Unspecified atrial flutter: Secondary | ICD-10-CM

## 2021-06-04 NOTE — Telephone Encounter (Signed)
Patient informed and verbalized understanding.  Medication list updated.

## 2021-06-04 NOTE — Telephone Encounter (Signed)
-----   Message from Delorise Jackson, MD sent at 06/04/2021  1:30 AM EDT ----- If pt at home review meds at home with list and make sure accurate please he did not know  A1C 6.8 up from 6.4 rec healthy diet and exercise if able  Thyroid lab normal  Cholesterol improved triglycerides still slightly elevated goal is <150 his 151  Kidney function stable but appears he has chronic kidney disease stage 3  Slightly anemic but improved overall  Urine pending   We need records [facility] please labs and last 2 PCP note and last 2 notes pulmonary and oncology please

## 2021-06-05 DIAGNOSIS — I152 Hypertension secondary to endocrine disorders: Secondary | ICD-10-CM | POA: Diagnosis not present

## 2021-06-05 DIAGNOSIS — G5793 Unspecified mononeuropathy of bilateral lower limbs: Secondary | ICD-10-CM | POA: Diagnosis not present

## 2021-06-05 DIAGNOSIS — F4310 Post-traumatic stress disorder, unspecified: Secondary | ICD-10-CM | POA: Diagnosis not present

## 2021-06-05 DIAGNOSIS — N400 Enlarged prostate without lower urinary tract symptoms: Secondary | ICD-10-CM | POA: Diagnosis not present

## 2021-06-05 DIAGNOSIS — E1159 Type 2 diabetes mellitus with other circulatory complications: Secondary | ICD-10-CM | POA: Diagnosis not present

## 2021-06-05 DIAGNOSIS — I2510 Atherosclerotic heart disease of native coronary artery without angina pectoris: Secondary | ICD-10-CM | POA: Diagnosis not present

## 2021-06-05 DIAGNOSIS — D649 Anemia, unspecified: Secondary | ICD-10-CM | POA: Diagnosis not present

## 2021-06-05 DIAGNOSIS — I4892 Unspecified atrial flutter: Secondary | ICD-10-CM | POA: Diagnosis not present

## 2021-06-05 DIAGNOSIS — M1990 Unspecified osteoarthritis, unspecified site: Secondary | ICD-10-CM | POA: Diagnosis not present

## 2021-06-05 LAB — MICROALBUMIN / CREATININE URINE RATIO
Creatinine, Urine: 77 mg/dL (ref 20–320)
Microalb Creat Ratio: 91 mcg/mg creat — ABNORMAL HIGH (ref <30)
Microalb, Ur: 7 mg/dL

## 2021-06-05 LAB — URINALYSIS, ROUTINE W REFLEX MICROSCOPIC
Bacteria, UA: NONE SEEN /HPF
Bilirubin Urine: NEGATIVE
Glucose, UA: NEGATIVE
Hgb urine dipstick: NEGATIVE
Hyaline Cast: NONE SEEN /LPF
Ketones, ur: NEGATIVE
Leukocytes,Ua: NEGATIVE
Nitrite: NEGATIVE
Specific Gravity, Urine: 1.013 (ref 1.001–1.035)
Squamous Epithelial / HPF: NONE SEEN /HPF (ref <=5)
pH: <=5 (ref 5.0–8.0)

## 2021-06-06 DIAGNOSIS — G5793 Unspecified mononeuropathy of bilateral lower limbs: Secondary | ICD-10-CM | POA: Diagnosis not present

## 2021-06-06 DIAGNOSIS — I2510 Atherosclerotic heart disease of native coronary artery without angina pectoris: Secondary | ICD-10-CM | POA: Diagnosis not present

## 2021-06-06 DIAGNOSIS — E1159 Type 2 diabetes mellitus with other circulatory complications: Secondary | ICD-10-CM | POA: Diagnosis not present

## 2021-06-06 DIAGNOSIS — D649 Anemia, unspecified: Secondary | ICD-10-CM | POA: Diagnosis not present

## 2021-06-06 DIAGNOSIS — I4892 Unspecified atrial flutter: Secondary | ICD-10-CM | POA: Diagnosis not present

## 2021-06-06 DIAGNOSIS — F4310 Post-traumatic stress disorder, unspecified: Secondary | ICD-10-CM | POA: Diagnosis not present

## 2021-06-06 DIAGNOSIS — I152 Hypertension secondary to endocrine disorders: Secondary | ICD-10-CM | POA: Diagnosis not present

## 2021-06-06 DIAGNOSIS — M1990 Unspecified osteoarthritis, unspecified site: Secondary | ICD-10-CM | POA: Diagnosis not present

## 2021-06-06 DIAGNOSIS — N400 Enlarged prostate without lower urinary tract symptoms: Secondary | ICD-10-CM | POA: Diagnosis not present

## 2021-06-07 DIAGNOSIS — E1159 Type 2 diabetes mellitus with other circulatory complications: Secondary | ICD-10-CM | POA: Insufficient documentation

## 2021-06-07 DIAGNOSIS — Z9181 History of falling: Secondary | ICD-10-CM | POA: Insufficient documentation

## 2021-06-07 DIAGNOSIS — I152 Hypertension secondary to endocrine disorders: Secondary | ICD-10-CM | POA: Insufficient documentation

## 2021-06-07 NOTE — Addendum Note (Signed)
Addended by: Orland Mustard on: 06/07/2021 01:02 PM   Modules accepted: Orders

## 2021-06-10 NOTE — Addendum Note (Signed)
Addended by: Orland Mustard on: 06/10/2021 07:05 PM   Modules accepted: Orders

## 2021-06-13 ENCOUNTER — Other Ambulatory Visit: Payer: Self-pay

## 2021-06-13 ENCOUNTER — Emergency Department: Payer: Medicare Other

## 2021-06-13 ENCOUNTER — Emergency Department
Admission: EM | Admit: 2021-06-13 | Discharge: 2021-06-13 | Disposition: A | Discharge status: Home / Self Care | Payer: Medicare Other | Attending: Emergency Medicine | Admitting: Emergency Medicine

## 2021-06-13 DIAGNOSIS — Z8679 Personal history of other diseases of the circulatory system: Secondary | ICD-10-CM | POA: Insufficient documentation

## 2021-06-13 DIAGNOSIS — E1169 Type 2 diabetes mellitus with other specified complication: Secondary | ICD-10-CM | POA: Insufficient documentation

## 2021-06-13 DIAGNOSIS — Z856 Personal history of leukemia: Secondary | ICD-10-CM | POA: Diagnosis not present

## 2021-06-13 DIAGNOSIS — Y92481 Parking lot as the place of occurrence of the external cause: Secondary | ICD-10-CM | POA: Insufficient documentation

## 2021-06-13 DIAGNOSIS — I2510 Atherosclerotic heart disease of native coronary artery without angina pectoris: Secondary | ICD-10-CM | POA: Diagnosis not present

## 2021-06-13 DIAGNOSIS — Z79899 Other long term (current) drug therapy: Secondary | ICD-10-CM | POA: Insufficient documentation

## 2021-06-13 DIAGNOSIS — E785 Hyperlipidemia, unspecified: Secondary | ICD-10-CM | POA: Diagnosis not present

## 2021-06-13 DIAGNOSIS — J449 Chronic obstructive pulmonary disease, unspecified: Secondary | ICD-10-CM | POA: Insufficient documentation

## 2021-06-13 DIAGNOSIS — W010XXA Fall on same level from slipping, tripping and stumbling without subsequent striking against object, initial encounter: Secondary | ICD-10-CM | POA: Insufficient documentation

## 2021-06-13 DIAGNOSIS — W19XXXA Unspecified fall, initial encounter: Secondary | ICD-10-CM

## 2021-06-13 DIAGNOSIS — S4991XA Unspecified injury of right shoulder and upper arm, initial encounter: Secondary | ICD-10-CM | POA: Diagnosis present

## 2021-06-13 DIAGNOSIS — S91114A Laceration without foreign body of right lesser toe(s) without damage to nail, initial encounter: Secondary | ICD-10-CM | POA: Insufficient documentation

## 2021-06-13 DIAGNOSIS — I10 Essential (primary) hypertension: Secondary | ICD-10-CM | POA: Insufficient documentation

## 2021-06-13 DIAGNOSIS — Z7901 Long term (current) use of anticoagulants: Secondary | ICD-10-CM | POA: Diagnosis not present

## 2021-06-13 DIAGNOSIS — Z7951 Long term (current) use of inhaled steroids: Secondary | ICD-10-CM | POA: Insufficient documentation

## 2021-06-13 DIAGNOSIS — Z7982 Long term (current) use of aspirin: Secondary | ICD-10-CM | POA: Insufficient documentation

## 2021-06-13 DIAGNOSIS — Z23 Encounter for immunization: Secondary | ICD-10-CM | POA: Insufficient documentation

## 2021-06-13 DIAGNOSIS — Z85118 Personal history of other malignant neoplasm of bronchus and lung: Secondary | ICD-10-CM | POA: Insufficient documentation

## 2021-06-13 DIAGNOSIS — S0990XA Unspecified injury of head, initial encounter: Secondary | ICD-10-CM | POA: Insufficient documentation

## 2021-06-13 DIAGNOSIS — S51811A Laceration without foreign body of right forearm, initial encounter: Secondary | ICD-10-CM | POA: Insufficient documentation

## 2021-06-13 DIAGNOSIS — S91119A Laceration without foreign body of unspecified toe without damage to nail, initial encounter: Secondary | ICD-10-CM

## 2021-06-13 DIAGNOSIS — Z87891 Personal history of nicotine dependence: Secondary | ICD-10-CM | POA: Diagnosis not present

## 2021-06-13 DIAGNOSIS — E1140 Type 2 diabetes mellitus with diabetic neuropathy, unspecified: Secondary | ICD-10-CM | POA: Insufficient documentation

## 2021-06-13 DIAGNOSIS — Y93K1 Activity, walking an animal: Secondary | ICD-10-CM | POA: Insufficient documentation

## 2021-06-13 DIAGNOSIS — E1165 Type 2 diabetes mellitus with hyperglycemia: Secondary | ICD-10-CM | POA: Insufficient documentation

## 2021-06-13 MED ORDER — ACETAMINOPHEN 500 MG PO TABS
1000.0000 mg | ORAL_TABLET | Freq: Once | ORAL | Status: AC
  Administered 2021-06-13: 1000 mg via ORAL
  Filled 2021-06-13: qty 2

## 2021-06-13 MED ORDER — TETANUS-DIPHTH-ACELL PERTUSSIS 5-2.5-18.5 LF-MCG/0.5 IM SUSY
0.5000 mL | PREFILLED_SYRINGE | Freq: Once | INTRAMUSCULAR | Status: AC
  Administered 2021-06-13: 0.5 mL via INTRAMUSCULAR
  Filled 2021-06-13: qty 0.5

## 2021-06-13 NOTE — ED Triage Notes (Addendum)
Pt to ER via POV after mechanical fall while walking his dog outside. Reports tripping and falling into the bushes. Takes blood thinners and has three bleeding lacerations on right arm. Wounds cleaned and pressure applied in triage.

## 2021-06-13 NOTE — ED Provider Notes (Addendum)
[facility] Emergency Department Provider Note  ____________________________________________   Event Date/Time   First MD Initiated Contact with Patient 06/13/21 1912     (approximate)  I have reviewed the triage vital signs and the nursing notes.   HISTORY  Chief Complaint Fall   HPI Donald [name] is a 87 y.o. male with a past medical history of anemia, arthritis, BPH, COPD, HTN, HDL and paroxysmal A. fib on Eliquis who presents for assessment of some cuts he sustained to his right forearm and the inside of his right second toe after he states he was walking in a parking lot outside the hospital coming to visit his wife who is currently hospitalized when he tripped.  He denies any presyncopal symptoms or loss of consciousness and is fairly certain that he tripped on something.  He states he did not hit his head and has no headache, neck pain, back pain, chest pain, abdominal pain or pain in any other extremity other than some soreness over his right forearm.  He denies any wrist pain weakness and states he has not had any recent sick symptoms including fevers, chills, cough, vomiting, diarrhea, dysuria, rash or other acute sick symptoms as far as he is aware.  He has been taking his Eliquis as prescribed.  No other acute concerns at this time.         Past Medical History:  Diagnosis Date   Anemia    Arthritis    BPH (benign prostatic hyperplasia)    Cancer (HCC)    Lung   COPD (chronic obstructive pulmonary disease) (HCC)    Emphysema of lung (HCC)    Emphysema of lung (HCC)    Esophageal reflux    Hyperlipidemia    Hypertension    Internal carotid artery stenosis, right    Neuropathy of both feet    Pneumonia    recurrent   PTSD (post-traumatic stress disorder)    [place] vet   Sebaceous cyst    SOB (shortness of breath) on exertion    TIA (transient ischemic attack)    Urge incontinence     Patient Active Problem List   Diagnosis  Date Noted   At high risk for falls 06/07/2021   Hypertension associated with diabetes ([place]) 06/07/2021   Dizziness 06/01/2021   Cerebral atherosclerosis 06/01/2021   Bronchogenic cancer of left lung ([place]) 10/31/2020   Lymphadenopathy 10/31/2020   Aortic atherosclerosis ([place]) 10/31/2020   Coronary artery disease involving native coronary artery of native heart without angina pectoris 10/31/2020   Mild cardiomegaly 10/31/2020   Hiatal hernia 10/31/2020   Diverticulosis 10/31/2020   Enlarged prostate 10/31/2020   Benign prostatic hyperplasia with incomplete bladder emptying 04/27/2020   Bladder outlet obstruction 04/27/2020   Memory loss 04/27/2020   Prediabetes 04/27/2020   Lung nodules 04/27/2020   Pulmonary emphysema ([place]) 04/27/2020   Anxiety and depression 02/22/2020   Left foot pain 02/22/2020   Type 2 diabetes mellitus with hyperglycemia, without long-term current use of insulin ([place]) 02/22/2020   Left upper lobe pulmonary nodule 08/10/2019   Frequent falls 04/13/2019   Abnormal gait 04/13/2019   History of stroke 12/29/2018   Falling episodes 12/29/2018   Bilateral carotid artery stenosis 04/01/2018   Stroke (cerebrum) ([place]) 03/19/2018   Leukocytosis 03/19/2018   CLL (chronic lymphocytic leukemia) ([place]) 03/19/2018   Fall 03/04/2018   Rash 03/04/2018   Recurrent pneumonia 01/03/2018   Mouth lesion 12/23/2017   Hoarseness 12/23/2017   Diabetes mellitus without  complication ([place]) 50/27/7412   Chronic back pain 03/05/2017   Pain in joint involving left lower leg 08/26/2016   GERD (gastroesophageal reflux disease) 08/05/2016   Nail anomaly 05/20/2016   Obesity (BMI 30-39.9) 06/20/2014   Dermatitis 02/22/2014   COPD exacerbation ([place]) 10/12/2013   Chronic obstructive pulmonary disease ([place]) 10/12/2013   Bradycardia 08/31/2013   History of smoking 08/11/2013   Personal history of tobacco use, presenting hazards to health 08/11/2013   SOBOE (shortness of breath on  exertion) 08/06/2013   Dysphagia, pharyngoesophageal phase 05/05/2013   Vesicular palmoplantar eczema 11/20/2012   Chronic prostatitis 09/11/2012   Elevated prostate specific antigen (PSA) 09/11/2012   Herpetic infection of penis 09/11/2012   ED (erectile dysfunction) of organic origin 09/11/2012   Incomplete emptying of bladder 09/11/2012   Nodular prostate with urinary obstruction 09/11/2012   Testicular hypofunction 09/11/2012   Urge incontinence 09/11/2012   Posttraumatic stress disorder 12/23/2011   Familial multiple lipoprotein-type hyperlipidemia 12/11/2011   Essential hypertension 09/10/2011    Past Surgical History:  Procedure Laterality Date   CYST EXCISION     buttocks   ELECTROMAGNETIC NAVIGATION BROCHOSCOPY Left 08/23/2019   Procedure: ELECTROMAGNETIC NAVIGATION BRONCHOSCOPY;  Surgeon: Tyler Pita, MD;  Location: [facility] ORS;  Service: Cardiopulmonary;  Laterality: Left;   ENDOBRONCHIAL ULTRASOUND N/A 08/23/2019   Procedure: ENDOBRONCHIAL ULTRASOUND;  Surgeon: Tyler Pita, MD;  Location: [facility] ORS;  Service: Cardiopulmonary;  Laterality: N/A;   HEMORRHOID SURGERY     shave biopsy  03/13/2020   left neck 03/13/20 [facility] [place] wart with atypica inflamed +AK no carcinoma    SINUSOTOMY     VIDEO BRONCHOSCOPY WITH ENDOBRONCHIAL NAVIGATION Left 06/26/2020   Procedure: VIDEO BRONCHOSCOPY WITH ENDOBRONCHIAL NAVIGATION;  Surgeon: Tyler Pita, MD;  Location: [facility] ORS;  Service: Pulmonary;  Laterality: Left;    Prior to Admission medications   Medication Sig Start Date End Date Taking? Authorizing Provider  albuterol (VENTOLIN HFA) 108 (90 Base) MCG/ACT inhaler Inhale 2 puffs into the lungs every 6 (six) hours as needed for wheezing or shortness of breath. 08/12/19   Tyler Pita, MD  amLODipine (NORVASC) 5 MG tablet Take 1 tablet (5 mg total) by mouth 2 (two) times daily. Patient not taking: Reported on 06/04/2021 05/20/16   Jackolyn Confer, MD   apixaban (ELIQUIS) 2.5 MG TABS tablet Take 1 tablet (2.5 mg total) by mouth 2 (two) times daily. 05/31/21   McLean-Scocuzza, Nino Glow, MD  aspirin 325 MG tablet Take 650 mg by mouth every 6 (six) hours as needed for mild pain or fever.    [provider]  atorvastatin (LIPITOR) 80 MG tablet Take 40 mg by mouth daily.    [provider]  cetirizine (ZYRTEC) 10 MG tablet Take 10 mg by mouth daily.    [provider]  cholecalciferol (VITAMIN D) 1000 UNITS tablet Take 1,000 Units by mouth 2 (two) times daily.     [provider]  DULoxetine (CYMBALTA) 30 MG capsule Take 30 mg by mouth 2 (two) times daily.    [provider]  famotidine (PEPCID) 20 MG tablet Take 1 tablet (20 mg total) by mouth daily as needed for heartburn or indigestion. 02/28/20   McLean-Scocuzza, Nino Glow, MD  finasteride (PROSCAR) 5 MG tablet Take 5 mg by mouth daily.    [provider]  Fluticasone-Umeclidin-Vilant (TRELEGY ELLIPTA) 100-62.5-25 MCG/INH AEPB Inhale 1 puff into the lungs daily. Rinse mouth Samples of this drug were given to the  patient, quantity 1, Lot Number [id number] exp 02/2022 Patient not taking: No sig reported 10/31/20   McLean-Scocuzza, Nino Glow, MD  gabapentin (NEURONTIN) 300 MG capsule Take 1 capsule (300 mg total) by mouth at bedtime. Patient not taking: Reported on 06/04/2021 02/22/20   McLean-Scocuzza, Nino Glow, MD  glucose blood test strip 1 each by Other route as needed. Use as instructed Patient not taking: Reported on 06/04/2021 06/08/20   McLean-Scocuzza, Nino Glow, MD  hydrochlorothiazide (HYDRODIURIL) 25 MG tablet Take 0.5 tablets (12.5 mg total) by mouth daily. Patient not taking: Reported on 06/04/2021 02/28/20   McLean-Scocuzza, Nino Glow, MD  ketoconazole (NIZORAL) 2 % cream Apply 1 application topically daily as needed.    [provider]  lisinopril (ZESTRIL) 20 MG tablet Take 20 mg by mouth daily.    [provider]  metoprolol tartrate  (LOPRESSOR) 50 MG tablet Take 1 tablet by mouth 2 (two) times daily. 02/06/21 02/06/22  [provider]  Multiple Vitamins-Minerals (MEGA MULTIVITAMIN FOR MEN) TABS Take 1 tablet by mouth daily.     [provider]  mupirocin ointment (BACTROBAN) 2 % Apply 1 application topically 2 (two) times daily. Prn left leg 05/31/21   McLean-Scocuzza, Nino Glow, MD  omeprazole (PRILOSEC) 20 MG capsule Take 20 mg by mouth daily.    [provider]  tamsulosin (FLOMAX) 0.4 MG CAPS capsule Take 1 capsule (0.4 mg total) by mouth daily. 09/06/19   Vaillancourt, Aldona Bar, PA-C  Tiotropium Bromide-Olodaterol 2.5-2.5 MCG/ACT AERS Inhale 2 puffs into the lungs daily. 02/28/20   McLean-Scocuzza, Nino Glow, MD  torsemide (DEMADEX) 20 MG tablet Take 20 mg by mouth daily.    [provider]  valACYclovir (VALTREX) 500 MG tablet Take 500 mg by mouth every other day.    [provider]    Allergies Cephalexin  Family History  Problem Relation Age of Onset   Heart disease Mother    Cancer Maternal Aunt        lung   Lung cancer Maternal Aunt    Leukemia Maternal Aunt    Hypertension Son     Social History Social History   Tobacco Use   Smoking status: Former    Packs/day: 1.50    Years: 50.00    Pack years: 75.00    Types: Cigarettes    Quit date: 09/10/1999    Years since quitting: 21.7   Smokeless tobacco: Never  Vaping Use   Vaping Use: Never used  Substance Use Topics   Alcohol use: No    Alcohol/week: 0.0 standard drinks    Comment: not since 12/1980   Drug use: No    Review of Systems  Review of Systems  Constitutional:  Negative for chills and fever.  HENT:  Negative for sore throat.   Eyes:  Negative for pain.  Respiratory:  Negative for cough and stridor.   Cardiovascular:  Negative for chest pain.  Gastrointestinal:  Negative for vomiting.  Genitourinary:  Negative for dysuria.  Musculoskeletal:  Positive for myalgias (R forearm).  Skin:   Negative for rash.  Neurological:  Negative for seizures, loss of consciousness and headaches.  Psychiatric/Behavioral:  Negative for suicidal ideas.   All other systems reviewed and are negative.    ____________________________________________   PHYSICAL EXAM:  VITAL SIGNS: ED Triage Vitals [06/13/21 1819]  Enc Vitals Group     BP 111/65     Pulse Rate (!) 50     Resp 18     Temp 98.4  F (36.9 C)     Temp Source Oral     SpO2 98 %     Weight 210 lb (95.3 kg)     Height 5\' 8"  (1.727 m)     Head Circumference      Peak Flow      Pain Score 3     Pain Loc      Pain Edu?      Excl. in [place]?    Vitals:   06/13/21 1819  BP: 111/65  Pulse: (!) 50  Resp: 18  Temp: 98.4 F (36.9 C)  SpO2: 98%   Physical Exam Vitals and nursing note reviewed.  Constitutional:      Appearance: He is well-developed.  HENT:     Head: Normocephalic and atraumatic.     Right Ear: External ear normal.     Left Ear: External ear normal.     Nose: Nose normal.  Eyes:     Conjunctiva/sclera: Conjunctivae normal.  Cardiovascular:     Rate and Rhythm: Normal rate and regular rhythm.     Heart sounds: No murmur heard. Pulmonary:     Effort: Pulmonary effort is normal. No respiratory distress.     Breath sounds: Normal breath sounds.  Abdominal:     Palpations: Abdomen is soft.     Tenderness: There is no abdominal tenderness.  Musculoskeletal:     Cervical back: Neck supple.  Skin:    General: Skin is warm and dry.  Neurological:     Mental Status: He is alert and oriented to person, place, and time.  Psychiatric:        Mood and Affect: Mood normal.    Cranial 2 through 12 grossly intact.  No significant tenderness over the C/T/L-spine.  Sensation is intact in the distribution of the radial nerves, ulnar nerves and median nerves in the bilateral upper extremities.  2+ radial and DP pulses.  Patient otherwise has full strength throughout the bilateral upper and lower extremities.  He has  approximately 3 cm jagged skin tear over the dorsum of the right forearm as well as 2.5 cm hemostatic linear lacerations over the more proximal forearm.  There is no effusion deformity or on the elbow or wrist and there is no snuffbox tenderness.  Patient also has a less than 0.25 cm linear hemostatic laceration on the medial aspect of the right second toe.  He is able to flex and extend at the proximal and distal ankle joint.  No other trauma visible to the toes foot or ankle. ____________________________________________   LABS (all labs ordered are listed, but only abnormal results are displayed)  Labs Reviewed - No data to display ____________________________________________  EKG  ____________________________________________  RADIOLOGY  ED MD interpretation: None from the right forearm shows no fracture or dislocation.  CT head shows no clear acute intracranial hemorrhage.  Official radiology report(s): DG Forearm Right  Result Date: 06/13/2021 CLINICAL DATA:  Fall, lacerations on the arm. EXAM: RIGHT FOREARM - 2 VIEW COMPARISON:  None. FINDINGS: Lacerations noted along the forearm. Distal humeral epicondylar spurring. Mild spurring at the first carpometacarpal articulation. No fracture or acute bony findings. IMPRESSION: 1. No fracture or acute bony findings. 2. Lacerations along the forearm. Electronically Signed   By: Van Clines M.D.   On: 06/13/2021 19:55    ____________________________________________   PROCEDURES  Procedure(s) performed (including Critical Care):  Marland KitchenMarland KitchenLaceration Repair  Date/Time: 06/13/2021 8:35 PM Performed by: Lucrezia Starch, MD Authorized by: Lucrezia Starch,  MD   Consent:    Consent obtained:  Verbal   Consent given by:  Patient   Risks discussed:  Pain, infection and need for additional repair   Alternatives discussed:  No treatment Universal protocol:    Patient identity confirmed:  Verbally with patient Laceration details:     Location:  Shoulder/arm   Shoulder/arm location:  R lower arm   Length (cm):  3 Exploration:    Limited defect created (wound extended): no     Imaging obtained: x-ray     Imaging outcome: foreign body not noted     Wound exploration: entire depth of wound visualized     Contaminated: no   Treatment:    Area cleansed with:  Soap and water   Amount of cleaning:  Extensive   Irrigation solution:  Sterile water   Irrigation method:  Pressure wash   Visualized foreign bodies/material removed: no     Undermining:  None   Scar revision: no   Skin repair:    Repair method:  Steri-Strips   Number of Steri-Strips:  3 Approximation:    Approximation:  Loose Repair type:    Repair type:  Simple Post-procedure details:    Procedure completion:  Tolerated well, no immediate complications   ____________________________________________   INITIAL IMPRESSION / ASSESSMENT AND PLAN / ED COURSE      Patient presents with above to history exam for assessment of the lacerations over his right forearm and a very small cut on the inside of his right second toe he sustained when he tripped while walking in a park on earlier today.  On arrival he is afebrile and hemodynamically stable.  Laceration as described as above.  No indication for closure given size of 2 proximal forearm and 2 lacerations.  The more distal forearm laceration is fairly large but extremely superficial consisting primarily of large skin tear not amenable to suture repair at this time.  Wounds were thoroughly cleaned and irrigated.  Tetanus was updated.  Case given some Tylenol.  Bandage dressing applied.  CT head obtained due to age and being on anticoagulation with mechanism described shows no acute intracranial hemorrhage on my interpretation..  Given stable vitals otherwise reassuring exam low suspicion for occult orthopedic or significant visceral injury.    Care patient signed over to [place] at approximately 9 PM.  Patient is  pending formal read of CT head and this is unremarkable he may be discharged home with close outpatient PCP follow-up.   ____________________________________________   FINAL CLINICAL IMPRESSION(S) / ED DIAGNOSES  Final diagnoses:  Fall, initial encounter  Laceration of right forearm, initial encounter  Laceration of toe of right foot without foreign body present or damage to nail, unspecified toe, initial encounter  Anticoagulated    Medications  acetaminophen (TYLENOL) tablet 1,000 mg (has no administration in time range)  Tdap (BOOSTRIX) injection 0.5 mL (has no administration in time range)     ED Discharge Orders     None        Note:  This document was prepared using Dragon voice recognition software and may include unintentional dictation errors.    Lucrezia Starch, MD 06/13/21 2036    Lucrezia Starch, MD 06/13/21 [phone]

## 2021-06-15 ENCOUNTER — Telehealth: Payer: Self-pay | Admitting: Internal Medicine

## 2021-06-15 NOTE — Telephone Encounter (Signed)
Pt is scheduled at [facility] neurology on 09/12/2021 a 9:45 am. I left vm for pt tocall ofc to sch [name].

## 2021-06-15 NOTE — Telephone Encounter (Signed)
Lft vm to call ofc to sch [name]. thanks

## 2021-06-19 ENCOUNTER — Telehealth: Payer: Self-pay | Admitting: Internal Medicine

## 2021-06-19 ENCOUNTER — Encounter: Payer: Self-pay | Admitting: Internal Medicine

## 2021-06-19 NOTE — Telephone Encounter (Signed)
Fax my eye doctor pt needs diabetic eye exam with next exam due 07/12/21 please fax results back to me  A1C   Results for Donald [name], Donald [name] (MRN [id number]) as of 06/19/2021 10:32  Ref. Range 06/01/2021 07:59  Hemoglobin A1C Latest Ref Range: 4.6 - 6.5 % 6.8 (H)

## 2021-06-19 NOTE — Telephone Encounter (Signed)
Sent via epic routing

## 2021-06-26 ENCOUNTER — Ambulatory Visit: Payer: Medicare HMO | Admitting: Family

## 2021-06-26 ENCOUNTER — Telehealth: Payer: Self-pay | Admitting: Internal Medicine

## 2021-06-26 NOTE — Telephone Encounter (Signed)
Rejection Reason - Patient did not respond - PATIENT DID NOT RETURN CALL TO SCHEDULE APPOINTMENT" Alfonse Spruce said on Jun 26, 2021 3:42 PM  "Eye [facility] FOR PATIENT TO SCHEDULE 7/25 JG" Alfonse Spruce said on Jun 04, 2021 11:53 AM  Msg from [facility] podiatry

## 2021-07-04 ENCOUNTER — Telehealth: Payer: Self-pay

## 2021-07-04 NOTE — Telephone Encounter (Signed)
Request for diabetic eye exam has been confirmed faxed to [facility] and sent to scan.

## 2021-07-04 NOTE — Telephone Encounter (Signed)
ROI to [facility] has been confirmed faxed and sent to scan.

## 2021-07-05 ENCOUNTER — Ambulatory Visit: Payer: Medicare Other | Attending: Internal Medicine

## 2021-07-06 ENCOUNTER — Telehealth: Payer: Self-pay

## 2021-07-06 ENCOUNTER — Telehealth: Payer: Self-pay | Admitting: Pharmacist

## 2021-07-06 DIAGNOSIS — I6529 Occlusion and stenosis of unspecified carotid artery: Secondary | ICD-10-CM

## 2021-07-06 DIAGNOSIS — R591 Generalized enlarged lymph nodes: Secondary | ICD-10-CM

## 2021-07-06 DIAGNOSIS — E1159 Type 2 diabetes mellitus with other circulatory complications: Secondary | ICD-10-CM

## 2021-07-06 DIAGNOSIS — I152 Hypertension secondary to endocrine disorders: Secondary | ICD-10-CM

## 2021-07-06 DIAGNOSIS — I4892 Unspecified atrial flutter: Secondary | ICD-10-CM

## 2021-07-06 NOTE — Telephone Encounter (Signed)
Spoke with patient's wife today for CCM visit. Per documentation in wife's chart on 07/02/21.  PCP intended both Sunday Spillers and [place] to receive CCM referral.   "Yes Juliann Pulse please this is ok and cc Padonda webb to cosign please or doc or day   Thank you For both patients please  They are hard to contact but Mr. Banks # listed in his chart is the most working # I have for now"  Placing referral for CCM to covering provider.

## 2021-07-06 NOTE — Chronic Care Management (AMB) (Signed)
  Chronic Care Management   Note  07/06/2021 Name: Burnett Spray MRN: [id number] DOB: 14-Nov-1932  Jermall Isaacson is a 87 y.o. year old male who is a primary care patient of McLean-Scocuzza, Nino Glow, MD. I reached out to Maeola Sarah by phone today in response to a referral sent by Mr. Eoin Willden Rundell's PCP, McLean-Scocuzza, Nino Glow, MD      Mr. Spratlin was given information about Chronic Care Management services today including:  CCM service includes personalized support from designated clinical staff supervised by his physician, including individualized plan of care and coordination with other care providers 24/7 contact phone numbers for assistance for urgent and routine care needs. Service will only be billed when office clinical staff spend 20 minutes or more in a month to coordinate care. Only one practitioner may furnish and bill the service in a calendar month. The patient may stop CCM services at any time (effective at the end of the month) by phone call to the office staff. The patient will be responsible for cost sharing (co-pay) of up to 20% of the service fee (after annual deductible is met).  Patient agreed to services and verbal consent obtained.   Follow up plan: Telephone appointment with care management team member scheduled for: Pharm D 07/11/2021 RN CM 07/20/2021  Noreene Larsson, [place], [place], [place] [address] Direct Dial: [phone] [email or web address]: [email or web address]

## 2021-07-06 NOTE — Telephone Encounter (Signed)
Signed.

## 2021-07-10 ENCOUNTER — Telehealth: Payer: Self-pay | Admitting: Internal Medicine

## 2021-07-10 NOTE — Telephone Encounter (Signed)
   Telephone encounter was:  Successful.  07/10/2021 Name: Daksh Coates MRN: [id number] DOB: 01-06-1933  Clearance Chenault is a 87 y.o. year old male who is a primary care patient of McLean-Scocuzza, Nino Glow, MD . The community resource team was consulted for assistance with Transportation Needs   Care guide performed the following interventions: Patient provided with information about care guide support team and interviewed to confirm resource needs Discussed resources to assist with [employer] with [employer]. Spoke with wife Sunday Spillers and she asked me to text her the number for [employer] because she wasn't home and needed to call to set up transportation for her appt on Thurs 9/1. I sent her the information and asked she call me if she had any issues setting up the ride. This is the same information [place] needed as well .  Follow Up Plan:  No further follow up planned at this time. The patient has been provided with needed resources.  April Green Care Guide, Embedded Care Coordination [place], Care Management Phone: [phone] Email: [email or web address]

## 2021-07-11 ENCOUNTER — Ambulatory Visit: Payer: Medicare Other | Admitting: Pharmacist

## 2021-07-11 NOTE — Chronic Care Management (AMB) (Signed)
   07/11/2021  [place] 1933-03-02 [id number]  Contacted patient for CCM Initial Visit with myself. His granddaughter, Albertina [place], answered the phone. Layna noted that providers from the [place] were there to see him. Spoke with patient and he asked that I review his pill bottles with the [place] that was in the home. She noted that he is enrolled in this [facility] program and receives regular primary care services, including support from nursing, social work, and a Automotive engineer, as well as care from PCP Whitney Muse, NP.   See medication list below. Patient is not taking several of his medications per review of his bottles. VA dietician noted that she would communicate that with his [place] primary care provider to have her visit his home and provide clarification and refills where needed.   Upon chart review, he has also been seeing Dr. Ola Spurr at [facility] for primary care services, last visit 06/19/21 after a ED visit.   Reviewed with patient, his wife, granddaughter, and [place] team on speaker phone that patient can choose to see an outside PCP, but that having 3 different PCP teams that don't know that the other exist puts him at significant risk of medication errors. He elects to receive care and care management/coordination services from his [facility] team at this time, and continue to fill his medication at the [place]. As he is already receiving the equivalent of chronic care management services, we decided not to open CCM case at this time.   Will communicate w/ RN CM and SW to cancel upcoming appointments.   Can re-open moving forward if patient decides to receive primary care services from Dr. Terese Door and cancel the [place] services.   Catie Darnelle Maffucci, PharmD, [place], [place] Clinical Pharmacist [employer] at [place]

## 2021-07-12 ENCOUNTER — Telehealth: Payer: Self-pay

## 2021-07-12 NOTE — Telephone Encounter (Signed)
Signed Release of information has been confirmed faxed and sent to scan.

## 2021-07-13 ENCOUNTER — Telehealth: Payer: Self-pay

## 2021-07-13 NOTE — Telephone Encounter (Signed)
Confirmed faxed [employer] health plan of care form. Form has been sent to scan.

## 2021-07-20 ENCOUNTER — Telehealth: Payer: Medicare Other

## 2021-07-26 ENCOUNTER — Other Ambulatory Visit: Payer: Self-pay

## 2021-07-26 ENCOUNTER — Telehealth: Payer: Medicare Other | Admitting: Family Medicine

## 2021-07-26 ENCOUNTER — Ambulatory Visit (INDEPENDENT_AMBULATORY_CARE_PROVIDER_SITE_OTHER)
Admission: EM | Admit: 2021-07-26 | Discharge: 2021-07-26 | Disposition: A | Discharge status: Home / Self Care | Payer: Medicare Other | Source: Home / Self Care | Attending: Emergency Medicine | Admitting: Emergency Medicine

## 2021-07-26 ENCOUNTER — Ambulatory Visit (INDEPENDENT_AMBULATORY_CARE_PROVIDER_SITE_OTHER): Payer: Medicare Other

## 2021-07-26 ENCOUNTER — Telehealth: Payer: Self-pay

## 2021-07-26 DIAGNOSIS — U071 COVID-19: Secondary | ICD-10-CM

## 2021-07-26 DIAGNOSIS — R059 Cough, unspecified: Secondary | ICD-10-CM | POA: Diagnosis not present

## 2021-07-26 DIAGNOSIS — A4189 Other specified sepsis: Secondary | ICD-10-CM | POA: Diagnosis not present

## 2021-07-26 DIAGNOSIS — R7989 Other specified abnormal findings of blood chemistry: Secondary | ICD-10-CM | POA: Diagnosis not present

## 2021-07-26 LAB — INFLUENZA A AND B ANTIGEN (CONVERTED LAB)
INFLUENZA A ANTIGEN, POC: NEGATIVE
INFLUENZA B ANTIGEN, POC: NEGATIVE

## 2021-07-26 LAB — POC SARS CORONAVIRUS 2 AG: SARSCOV2ONAVIRUS 2 AG: POSITIVE — AB

## 2021-07-26 MED ORDER — MOLNUPIRAVIR EUA 200MG CAPSULE: 4.0000 | ORAL_CAPSULE | Freq: Two times a day (BID) | ORAL | 0 refills | Status: DC

## 2021-07-26 NOTE — ED Triage Notes (Signed)
Pt c/o runny nose, cough and fever of 101.5 today. Pt states his wife is also sick and has tested negative for COVID. Pt reports symptoms for 2 days.

## 2021-07-26 NOTE — Discharge Instructions (Addendum)
Your COVID test is positive.  I am sending you home with Molnupiravir because you are at high risk for severe disease.  Get a pulse oximeter and keep an eye on your oxygen levels.  Could try using the albuterol inhaler with a spacer 2 puffs every 4 hours as needed for cough.  Flonase, saline nasal irrigation for the nasal congestion.  Go to the emergency room if they go consistently below 98%.  Go to the ER for chest pain, worsening shortness of breath, altered mental status, or for any other concerns.

## 2021-07-26 NOTE — Telephone Encounter (Signed)
Agree with need for evaluation if sick and not feeling well, cough, etc.

## 2021-07-26 NOTE — Telephone Encounter (Signed)
Placed a call to patients wife, Gurveer Colucci, to discuss B12 refills. Sunday Spillers did not understand me and stated that Jehiel was sick and was having the same symptoms as herself a few days ago. Sunday Spillers stated that Zaire was coughing and feeling tired and that she was on her way to the urgent care for him to be evaluated. Agreed that he should be evaluated and Sunday Spillers stated that she was on her way to the urgent care.

## 2021-07-26 NOTE — ED Provider Notes (Signed)
HPI  SUBJECTIVE:  Donald [name] is a 87 y.o. male who presents with 2 days of malaise, fevers T-max 102, nasal congestion, clear rhinorrhea, cough, increased confusion per daughter.  He reports postnasal drip.  He denies wheezing, change in his baseline, chest pain, body aches, headaches, loss of sense of smell or taste, sore throat, nausea, vomiting, fevers, abdominal pain.  No urinary complaints.  His wife is sick with similar symptoms for the past 5 days, but she had a negative rapid home test.  No known COVID or flu exposure.  He got the second COVID booster.  No antibiotics in the past 3 months.  No antipyretic in the past 6 hours.  He has been using over-the-counter cough syrup without improvement in his symptoms.  No aggravating or alleviating factors.  He has had similar symptoms before when he has had recurrent pneumonia.  Patient has an extensive past medical history including lung cancer, COPD, diabetes, hypertension, stroke on Eliquis, frequent pneumonia and atrial flutter.  ZOX:[id number], Nino Glow, MD Pulmonology: Dr. Lanney Gins  Additional history obtained from daughter.  Past Medical History:  Diagnosis Date   Anemia    Arthritis    BPH (benign prostatic hyperplasia)    Cancer (HCC)    Lung   COPD (chronic obstructive pulmonary disease) (HCC)    Emphysema of lung (HCC)    Emphysema of lung (HCC)    Esophageal reflux    Hyperlipidemia    Hypertension    Internal carotid artery stenosis, right    Neuropathy of both feet    Pneumonia    recurrent   PTSD (post-traumatic stress disorder)    [place] vet   Sebaceous cyst    SOB (shortness of breath) on exertion    TIA (transient ischemic attack)    Urge incontinence     Past Surgical History:  Procedure Laterality Date   CYST EXCISION     buttocks   ELECTROMAGNETIC NAVIGATION BROCHOSCOPY Left 08/23/2019   Procedure: ELECTROMAGNETIC NAVIGATION BRONCHOSCOPY;  Surgeon: Tyler Pita, MD;  Location: [facility]  ORS;  Service: Cardiopulmonary;  Laterality: Left;   ENDOBRONCHIAL ULTRASOUND N/A 08/23/2019   Procedure: ENDOBRONCHIAL ULTRASOUND;  Surgeon: Tyler Pita, MD;  Location: [facility] ORS;  Service: Cardiopulmonary;  Laterality: N/A;   HEMORRHOID SURGERY     shave biopsy  03/13/2020   left neck 03/13/20 [facility] wart with atypica inflamed +AK no carcinoma    SINUSOTOMY     VIDEO BRONCHOSCOPY WITH ENDOBRONCHIAL NAVIGATION Left 06/26/2020   Procedure: VIDEO BRONCHOSCOPY WITH ENDOBRONCHIAL NAVIGATION;  Surgeon: Tyler Pita, MD;  Location: [facility] ORS;  Service: Pulmonary;  Laterality: Left;    Family History  Problem Relation Age of Onset   Heart disease Mother    Cancer Maternal Aunt        lung   Lung cancer Maternal Aunt    Leukemia Maternal Aunt    Hypertension Son     Social History   Tobacco Use   Smoking status: Former    Packs/day: 1.50    Years: 50.00    Pack years: 75.00    Types: Cigarettes    Quit date: 09/10/1999    Years since quitting: 21.8   Smokeless tobacco: Never  Vaping Use   Vaping Use: Never used  Substance Use Topics   Alcohol use: No    Alcohol/week: 0.0 standard drinks    Comment: not since 12/1980   Drug use: No    No current facility-administered medications for  this encounter.  Current Outpatient Medications:    albuterol (VENTOLIN HFA) 108 (90 Base) MCG/ACT inhaler, Inhale 2 puffs into the lungs every 6 (six) hours as needed for wheezing or shortness of breath., Disp: 18 g, Rfl: 6   apixaban (ELIQUIS) 2.5 MG TABS tablet, Take 1 tablet (2.5 mg total) by mouth 2 (two) times daily., Disp: , Rfl:    cetirizine (ZYRTEC) 10 MG tablet, Take 10 mg by mouth daily., Disp: , Rfl:    cholecalciferol (VITAMIN D) 1000 UNITS tablet, Take 1,000 Units by mouth daily., Disp: , Rfl:    DULoxetine (CYMBALTA) 30 MG capsule, Take 30 mg by mouth 2 (two) times daily., Disp: , Rfl:    famotidine (PEPCID) 20 MG tablet, Take 1 tablet (20 mg total) by mouth  daily as needed for heartburn or indigestion., Disp: , Rfl:    metoprolol tartrate (LOPRESSOR) 50 MG tablet, Take 1 tablet by mouth 2 (two) times daily., Disp: , Rfl:    molnupiravir EUA (LAGEVRIO) 200 mg CAPS capsule, Take 4 capsules (800 mg total) by mouth 2 (two) times daily for 5 days., Disp: 40 capsule, Rfl: 0   omeprazole (PRILOSEC) 40 MG capsule, Take 40 mg by mouth daily., Disp: , Rfl:    Tiotropium Bromide-Olodaterol 2.5-2.5 MCG/ACT AERS, Inhale 2 puffs into the lungs daily., Disp:  , Rfl:    aspirin 325 MG tablet, Take 650 mg by mouth every 6 (six) hours as needed for mild pain or fever., Disp: , Rfl:    atorvastatin (LIPITOR) 80 MG tablet, Take 40 mg by mouth daily. (Patient not taking: Reported on 07/11/2021), Disp: , Rfl:    finasteride (PROSCAR) 5 MG tablet, Take 5 mg by mouth daily. (Patient not taking: Reported on 07/11/2021), Disp: , Rfl:    Fluticasone-Umeclidin-Vilant (TRELEGY ELLIPTA) 100-62.5-25 MCG/INH AEPB, Inhale 1 puff into the lungs daily. Rinse mouth Samples of this drug were given to the patient, quantity 1, Lot Number [id number] exp 02/2022 (Patient not taking: No sig reported), Disp: 14 each, Rfl: 0   gabapentin (NEURONTIN) 300 MG capsule, Take 1 capsule (300 mg total) by mouth at bedtime. (Patient not taking: No sig reported), Disp: 90 capsule, Rfl: 3   glucose blood test strip, 1 each by Other route as needed. Use as instructed (Patient not taking: No sig reported), Disp: 100 each, Rfl: 1   hydrochlorothiazide (HYDRODIURIL) 25 MG tablet, Take 0.5 tablets (12.5 mg total) by mouth daily. (Patient not taking: No sig reported), Disp: , Rfl:    lisinopril (ZESTRIL) 20 MG tablet, Take 20 mg by mouth daily. (Patient not taking: Reported on 07/11/2021), Disp: , Rfl:    Multiple Vitamins-Minerals (MEGA MULTIVITAMIN FOR MEN) TABS, Take 1 tablet by mouth daily.  (Patient not taking: Reported on 07/11/2021), Disp: , Rfl:    mupirocin ointment (BACTROBAN) 2 %, Apply 1 application topically 2  (two) times daily. Prn left leg (Patient not taking: Reported on 07/11/2021), Disp: 30 g, Rfl: 1   tamsulosin (FLOMAX) 0.4 MG CAPS capsule, Take 1 capsule (0.4 mg total) by mouth daily. (Patient not taking: Reported on 07/11/2021), Disp: 90 capsule, Rfl: 2   torsemide (DEMADEX) 20 MG tablet, Take 20 mg by mouth daily. (Patient not taking: Reported on 07/11/2021), Disp: , Rfl:   Allergies  Allergen Reactions   Cephalexin Rash     ROS  As noted in HPI.   Physical Exam  BP 130/79 (BP Location: Left Arm)   Pulse 92   Temp (!) 100.7 F (38.2  C) (Oral)   Resp 18   Ht 5\' 8"  (1.727 m)   Wt 64.4 kg   SpO2 97%   BMI 21.59 kg/m   Constitutional: Well developed, well nourished, no acute distress Eyes:  EOMI, conjunctiva normal bilaterally HENT: Normocephalic, atraumatic,mucus membranes moist.  Mild nasal congestion.  No maxillary, frontal sinus tenderness.  Normal oropharynx.  No obvious postnasal drip. Neck: No cervical adenopathy, meningismus Respiratory: Normal inspiratory effort, fair air movement Cardiovascular: Normal rate, regular rhythm, no murmurs rubs or gallops GI: nondistended skin: No rash, skin intact Musculoskeletal: no deformities Neurologic: Alert & oriented x 3, answers questions appropriately, no focal neuro deficits Psychiatric: Speech and behavior appropriate   ED Course   Medications - No data to display  Orders Placed This Encounter  Procedures   DG Chest 2 View    Standing Status:   Standing    Number of Occurrences:   1    Order Specific Question:   Reason for Exam (SYMPTOM  OR DIAGNOSIS REQUIRED)    Answer:   cough fever r/o PNA   Influenza A and B antigen    Standing Status:   Standing    Number of Occurrences:   1   Droplet precaution    Standing Status:   Standing    Number of Occurrences:   1   POC Influenza A & B Ag (Urgent Care)    Standing Status:   Standing    Number of Occurrences:   1   POC SARS Coronavirus 2 Ag-ED - Nasal Swab     Standing Status:   Standing    Number of Occurrences:   1    Order Specific Question:   Is this test for diagnosis or screening    Answer:   Diagnosis of ill patient    Order Specific Question:   Symptomatic for COVID-19 as defined by CDC    Answer:   Yes    Order Specific Question:   Date of Symptom Onset    Answer:   07/24/2021    Order Specific Question:   Hospitalized for COVID-19    Answer:   No    Order Specific Question:   Admitted to ICU for COVID-19    Answer:   No    Order Specific Question:   Previously tested for COVID-19    Answer:   Unknown    Order Specific Question:   Resident in a congregate (group) care setting    Answer:   No    Order Specific Question:   Employed in healthcare setting    Answer:   No    Order Specific Question:   Has patient completed COVID vaccination(s) (2 doses of Pfizer/Moderna 1 dose of [employer])    Answer:   Unknown   POC SARS Coronavirus 2 Ag    Standing Status:   Standing    Number of Occurrences:   1    Results for orders placed or performed during the hospital encounter of 07/26/21 (from the past 24 hour(s))  POC SARS Coronavirus 2 Ag     Status: Abnormal   Collection Time: 07/26/21  6:21 PM  Result Value Ref Range   SARSCOV2ONAVIRUS 2 AG POSITIVE (A) NEGATIVE  Influenza A and B antigen     Status: None   Collection Time: 07/26/21  6:22 PM  Result Value Ref Range   INFLUENZA A ANTIGEN, POC NEGATIVE NEGATIVE   INFLUENZA B ANTIGEN, POC NEGATIVE NEGATIVE   DG Chest  2 View  Result Date: 07/26/2021 CLINICAL DATA:  Cough and fever EXAM: CHEST - 2 VIEW COMPARISON:  06/26/2020, PET CT 07/11/2020 FINDINGS: No focal opacity or pleural effusion. Stable cardiomediastinal silhouette. Degenerative changes of the spine. Faint left apical lung nodules, likely corresponding to CT demonstrated clustered nodules. IMPRESSION: No active cardiopulmonary disease.  Left apical nodules. Electronically Signed   By: Donavan Foil M.D.   On: 07/26/2021  19:05    ED Clinical Impression  1. COVID-19 virus infection      ED Assessment/Plan  Patient febrile here, but appears nontoxic.  He is in no respiratory distress.  Checking rapid COVID and flu, if these are negative, will send off triplex PCR.  We will also check a chest x-ray.  Calculated creatinine clearance 32 mL/min based on labs from 06/01/2021  COVID-positive.  Flu negative.  Will send home with Molnupiravir because he is on multiple medications.  Reviewed imaging independently.  Faint apical left lung nodules corresponding to CT demonstrated clustered nodules.  No acute cardiopulmonary disease.  See radiology report for full details.  Home with Flonase, Molnupiravir.  Patient states he does not need more albuterol nor spacer.  Strict ER return precautions given.  Discussed labs, imaging, MDM, treatment plan, and plan for follow-up with daughter and patient.  Discussed signs and symptoms that should prompt return to the emergency department.  They agree with plan  Meds ordered this encounter  Medications   molnupiravir EUA (LAGEVRIO) 200 mg CAPS capsule    Sig: Take 4 capsules (800 mg total) by mouth 2 (two) times daily for 5 days.    Dispense:  40 capsule    Refill:  0       *This clinic note was created using [employer]. Therefore, there may be occasional mistakes despite careful proofreading.  ?    Melynda Ripple, MD 07/26/21 1932

## 2021-07-27 NOTE — Telephone Encounter (Signed)
Pt was seen at the urgent care in [place] on 07/26/21

## 2021-07-29 ENCOUNTER — Inpatient Hospital Stay
Admission: EM | Admit: 2021-07-29 | Discharge: 2021-08-01 | DRG: 871 | Disposition: A | Discharge status: Home Health | Payer: Medicare Other | Attending: Internal Medicine | Admitting: Internal Medicine

## 2021-07-29 ENCOUNTER — Emergency Department: Payer: Medicare Other

## 2021-07-29 ENCOUNTER — Other Ambulatory Visit: Payer: Self-pay

## 2021-07-29 DIAGNOSIS — Z8701 Personal history of pneumonia (recurrent): Secondary | ICD-10-CM

## 2021-07-29 DIAGNOSIS — G9341 Metabolic encephalopathy: Secondary | ICD-10-CM | POA: Diagnosis present

## 2021-07-29 DIAGNOSIS — Z801 Family history of malignant neoplasm of trachea, bronchus and lung: Secondary | ICD-10-CM | POA: Diagnosis not present

## 2021-07-29 DIAGNOSIS — R7989 Other specified abnormal findings of blood chemistry: Secondary | ICD-10-CM | POA: Diagnosis present

## 2021-07-29 DIAGNOSIS — R0902 Hypoxemia: Secondary | ICD-10-CM | POA: Diagnosis present

## 2021-07-29 DIAGNOSIS — A4189 Other specified sepsis: Secondary | ICD-10-CM | POA: Diagnosis present

## 2021-07-29 DIAGNOSIS — F0390 Unspecified dementia without behavioral disturbance: Secondary | ICD-10-CM | POA: Diagnosis present

## 2021-07-29 DIAGNOSIS — N1831 Chronic kidney disease, stage 3a: Secondary | ICD-10-CM

## 2021-07-29 DIAGNOSIS — R918 Other nonspecific abnormal finding of lung field: Secondary | ICD-10-CM

## 2021-07-29 DIAGNOSIS — Z85118 Personal history of other malignant neoplasm of bronchus and lung: Secondary | ICD-10-CM | POA: Diagnosis not present

## 2021-07-29 DIAGNOSIS — F4310 Post-traumatic stress disorder, unspecified: Secondary | ICD-10-CM | POA: Diagnosis present

## 2021-07-29 DIAGNOSIS — N189 Chronic kidney disease, unspecified: Secondary | ICD-10-CM

## 2021-07-29 DIAGNOSIS — N179 Acute kidney failure, unspecified: Secondary | ICD-10-CM

## 2021-07-29 DIAGNOSIS — N1832 Chronic kidney disease, stage 3b: Secondary | ICD-10-CM | POA: Diagnosis present

## 2021-07-29 DIAGNOSIS — I6521 Occlusion and stenosis of right carotid artery: Secondary | ICD-10-CM | POA: Diagnosis present

## 2021-07-29 DIAGNOSIS — J189 Pneumonia, unspecified organism: Secondary | ICD-10-CM | POA: Diagnosis present

## 2021-07-29 DIAGNOSIS — R6520 Severe sepsis without septic shock: Secondary | ICD-10-CM | POA: Diagnosis not present

## 2021-07-29 DIAGNOSIS — Z87891 Personal history of nicotine dependence: Secondary | ICD-10-CM

## 2021-07-29 DIAGNOSIS — Z79899 Other long term (current) drug therapy: Secondary | ICD-10-CM

## 2021-07-29 DIAGNOSIS — Z8673 Personal history of transient ischemic attack (TIA), and cerebral infarction without residual deficits: Secondary | ICD-10-CM | POA: Diagnosis not present

## 2021-07-29 DIAGNOSIS — J439 Emphysema, unspecified: Secondary | ICD-10-CM | POA: Diagnosis present

## 2021-07-29 DIAGNOSIS — Z806 Family history of leukemia: Secondary | ICD-10-CM | POA: Diagnosis not present

## 2021-07-29 DIAGNOSIS — Z923 Personal history of irradiation: Secondary | ICD-10-CM

## 2021-07-29 DIAGNOSIS — J9601 Acute respiratory failure with hypoxia: Secondary | ICD-10-CM

## 2021-07-29 DIAGNOSIS — E785 Hyperlipidemia, unspecified: Secondary | ICD-10-CM | POA: Diagnosis present

## 2021-07-29 DIAGNOSIS — Z8249 Family history of ischemic heart disease and other diseases of the circulatory system: Secondary | ICD-10-CM

## 2021-07-29 DIAGNOSIS — J1282 Pneumonia due to coronavirus disease 2019: Secondary | ICD-10-CM | POA: Diagnosis present

## 2021-07-29 DIAGNOSIS — N400 Enlarged prostate without lower urinary tract symptoms: Secondary | ICD-10-CM | POA: Diagnosis present

## 2021-07-29 DIAGNOSIS — Z7982 Long term (current) use of aspirin: Secondary | ICD-10-CM

## 2021-07-29 DIAGNOSIS — U071 COVID-19: Secondary | ICD-10-CM | POA: Diagnosis present

## 2021-07-29 DIAGNOSIS — G629 Polyneuropathy, unspecified: Secondary | ICD-10-CM | POA: Diagnosis present

## 2021-07-29 DIAGNOSIS — G9340 Encephalopathy, unspecified: Secondary | ICD-10-CM | POA: Diagnosis not present

## 2021-07-29 DIAGNOSIS — E1122 Type 2 diabetes mellitus with diabetic chronic kidney disease: Secondary | ICD-10-CM | POA: Diagnosis present

## 2021-07-29 DIAGNOSIS — K219 Gastro-esophageal reflux disease without esophagitis: Secondary | ICD-10-CM | POA: Diagnosis present

## 2021-07-29 DIAGNOSIS — R6510 Systemic inflammatory response syndrome (SIRS) of non-infectious origin without acute organ dysfunction: Secondary | ICD-10-CM

## 2021-07-29 DIAGNOSIS — R531 Weakness: Secondary | ICD-10-CM | POA: Diagnosis not present

## 2021-07-29 DIAGNOSIS — I2510 Atherosclerotic heart disease of native coronary artery without angina pectoris: Secondary | ICD-10-CM | POA: Diagnosis present

## 2021-07-29 DIAGNOSIS — R9431 Abnormal electrocardiogram [ECG] [EKG]: Secondary | ICD-10-CM | POA: Diagnosis present

## 2021-07-29 DIAGNOSIS — I129 Hypertensive chronic kidney disease with stage 1 through stage 4 chronic kidney disease, or unspecified chronic kidney disease: Secondary | ICD-10-CM | POA: Diagnosis present

## 2021-07-29 DIAGNOSIS — Z7901 Long term (current) use of anticoagulants: Secondary | ICD-10-CM

## 2021-07-29 DIAGNOSIS — A419 Sepsis, unspecified organism: Secondary | ICD-10-CM | POA: Diagnosis not present

## 2021-07-29 DIAGNOSIS — R4182 Altered mental status, unspecified: Secondary | ICD-10-CM | POA: Diagnosis present

## 2021-07-29 DIAGNOSIS — I4510 Unspecified right bundle-branch block: Secondary | ICD-10-CM | POA: Diagnosis present

## 2021-07-29 DIAGNOSIS — R778 Other specified abnormalities of plasma proteins: Secondary | ICD-10-CM

## 2021-07-29 LAB — PROTIME-INR
INR: 1.2 (ref 0.8–1.2)
Prothrombin Time: 14.7 seconds (ref 11.4–15.2)

## 2021-07-29 LAB — LACTIC ACID, PLASMA
Lactic Acid, Venous: 1.6 mmol/L (ref 0.5–1.9)
Lactic Acid, Venous: 2 mmol/L (ref 0.5–1.9)

## 2021-07-29 LAB — BASIC METABOLIC PANEL
Anion gap: 10 (ref 5–15)
BUN: 33 mg/dL — ABNORMAL HIGH (ref 8–23)
CO2: 23 mmol/L (ref 22–32)
Calcium: 9.3 mg/dL (ref 8.9–10.3)
Chloride: 102 mmol/L (ref 98–111)
Creatinine, Ser: 1.73 mg/dL — ABNORMAL HIGH (ref 0.61–1.24)
GFR, Estimated: 38 mL/min — ABNORMAL LOW (ref >60)
Glucose, Bld: 113 mg/dL — ABNORMAL HIGH (ref 70–99)
Potassium: 4.7 mmol/L (ref 3.5–5.1)
Sodium: 135 mmol/L (ref 135–145)

## 2021-07-29 LAB — BLOOD GAS, VENOUS
Acid-base deficit: 3.2 mmol/L — ABNORMAL HIGH (ref 0.0–2.0)
Bicarbonate: 23.2 mmol/L (ref 20.0–28.0)
O2 Saturation: 56.7 %
Patient temperature: 37
pCO2, Ven: 46 mmHg (ref 44.0–60.0)
pH, Ven: 7.31 (ref 7.250–7.430)
pO2, Ven: 33 mmHg (ref 32.0–45.0)

## 2021-07-29 LAB — URINALYSIS, COMPLETE (UACMP) WITH MICROSCOPIC
Bilirubin Urine: NEGATIVE
Glucose, UA: NEGATIVE mg/dL
Hgb urine dipstick: NEGATIVE
Ketones, ur: NEGATIVE mg/dL
Leukocytes,Ua: NEGATIVE
Nitrite: NEGATIVE
Protein, ur: 30 mg/dL — AB
Specific Gravity, Urine: 1.025 (ref 1.005–1.030)
pH: 5.5 (ref 5.0–8.0)

## 2021-07-29 LAB — HEPATIC FUNCTION PANEL
ALT: 19 U/L (ref 0–44)
AST: 27 U/L (ref 15–41)
Albumin: 3.8 g/dL (ref 3.5–5.0)
Alkaline Phosphatase: 77 U/L (ref 38–126)
Bilirubin, Direct: <0.1 mg/dL (ref 0.0–0.2)
Total Bilirubin: 0.8 mg/dL (ref 0.3–1.2)
Total Protein: 7 g/dL (ref 6.5–8.1)

## 2021-07-29 LAB — CBC
HCT: 34.4 % — ABNORMAL LOW (ref 39.0–52.0)
Hemoglobin: 11.3 g/dL — ABNORMAL LOW (ref 13.0–17.0)
MCH: 29.4 pg (ref 26.0–34.0)
MCHC: 32.8 g/dL (ref 30.0–36.0)
MCV: 89.6 fL (ref 80.0–100.0)
Platelets: 186 10*3/uL (ref 150–400)
RBC: 3.84 MIL/uL — ABNORMAL LOW (ref 4.22–5.81)
RDW: 14.6 % (ref 11.5–15.5)
WBC: 10.7 10*3/uL — ABNORMAL HIGH (ref 4.0–10.5)
nRBC: 0 % (ref 0.0–0.2)

## 2021-07-29 LAB — TROPONIN I (HIGH SENSITIVITY)
Troponin I (High Sensitivity): 22 ng/L — ABNORMAL HIGH (ref <18)
Troponin I (High Sensitivity): 30 ng/L — ABNORMAL HIGH (ref <18)

## 2021-07-29 LAB — BRAIN NATRIURETIC PEPTIDE: B Natriuretic Peptide: 80.4 pg/mL (ref 0.0–100.0)

## 2021-07-29 LAB — APTT: aPTT: 34 seconds (ref 24–36)

## 2021-07-29 LAB — PROCALCITONIN: Procalcitonin: 0.16 ng/mL

## 2021-07-29 LAB — D-DIMER, QUANTITATIVE: D-Dimer, Quant: 1.73 ug/mL-FEU — ABNORMAL HIGH (ref 0.00–0.50)

## 2021-07-29 LAB — MAGNESIUM: Magnesium: 2.2 mg/dL (ref 1.7–2.4)

## 2021-07-29 MED ORDER — SODIUM CHLORIDE 0.9% FLUSH
3.0000 mL | Freq: Two times a day (BID) | INTRAVENOUS | Status: DC
  Administered 2021-07-29 – 2021-08-01 (×6): 3 mL via INTRAVENOUS

## 2021-07-29 MED ORDER — FAMOTIDINE 20 MG PO TABS
20.0000 mg | ORAL_TABLET | Freq: Every day | ORAL | Status: DC | PRN
  Administered 2021-07-30 – 2021-08-01 (×2): 20 mg via ORAL
  Filled 2021-07-29 (×2): qty 1

## 2021-07-29 MED ORDER — DOXYCYCLINE HYCLATE 100 MG PO TABS
100.0000 mg | ORAL_TABLET | Freq: Two times a day (BID) | ORAL | Status: DC
  Administered 2021-07-30 – 2021-08-01 (×5): 100 mg via ORAL
  Filled 2021-07-29 (×5): qty 1

## 2021-07-29 MED ORDER — IOHEXOL 350 MG/ML SOLN
60.0000 mL | Freq: Once | INTRAVENOUS | Status: AC | PRN
  Administered 2021-07-29: 60 mL via INTRAVENOUS
  Filled 2021-07-29: qty 60

## 2021-07-29 MED ORDER — SODIUM CHLORIDE 0.9 % IV SOLN
100.0000 mg | Freq: Once | INTRAVENOUS | Status: AC
  Administered 2021-07-29: 100 mg via INTRAVENOUS
  Filled 2021-07-29: qty 100

## 2021-07-29 MED ORDER — LACTATED RINGERS IV BOLUS
1000.0000 mL | Freq: Once | INTRAVENOUS | Status: AC
  Administered 2021-07-29: 1000 mL via INTRAVENOUS

## 2021-07-29 MED ORDER — DULOXETINE HCL 30 MG PO CPEP
30.0000 mg | ORAL_CAPSULE | Freq: Two times a day (BID) | ORAL | Status: DC
  Administered 2021-07-30 – 2021-08-01 (×5): 30 mg via ORAL
  Filled 2021-07-29 (×7): qty 1

## 2021-07-29 MED ORDER — DEXAMETHASONE SODIUM PHOSPHATE 10 MG/ML IJ SOLN
6.0000 mg | Freq: Every day | INTRAMUSCULAR | Status: DC
  Administered 2021-07-30 – 2021-08-01 (×3): 6 mg via INTRAVENOUS
  Filled 2021-07-29 (×3): qty 1

## 2021-07-29 MED ORDER — SODIUM CHLORIDE 0.9 % IV SOLN
1.0000 g | Freq: Once | INTRAVENOUS | Status: AC
  Administered 2021-07-29: 1 g via INTRAVENOUS
  Filled 2021-07-29: qty 10

## 2021-07-29 MED ORDER — IPRATROPIUM-ALBUTEROL 0.5-2.5 (3) MG/3ML IN SOLN: 3.0000 mL | RESPIRATORY_TRACT | Status: DC | PRN

## 2021-07-29 MED ORDER — ACETAMINOPHEN 500 MG PO TABS
1000.0000 mg | ORAL_TABLET | Freq: Four times a day (QID) | ORAL | Status: DC | PRN
  Administered 2021-07-29: 1000 mg via ORAL
  Filled 2021-07-29: qty 2

## 2021-07-29 MED ORDER — METOPROLOL TARTRATE 50 MG PO TABS
50.0000 mg | ORAL_TABLET | Freq: Two times a day (BID) | ORAL | Status: DC
  Administered 2021-07-29 – 2021-08-01 (×6): 50 mg via ORAL
  Filled 2021-07-29 (×6): qty 1

## 2021-07-29 MED ORDER — ALBUTEROL SULFATE HFA 108 (90 BASE) MCG/ACT IN AERS
2.0000 | INHALATION_SPRAY | Freq: Four times a day (QID) | RESPIRATORY_TRACT | Status: DC | PRN
  Filled 2021-07-29: qty 6.7

## 2021-07-29 MED ORDER — LACTATED RINGERS IV BOLUS
500.0000 mL | Freq: Once | INTRAVENOUS | Status: AC
  Administered 2021-07-29: 500 mL via INTRAVENOUS

## 2021-07-29 MED ORDER — DEXAMETHASONE SODIUM PHOSPHATE 10 MG/ML IJ SOLN
6.0000 mg | Freq: Once | INTRAMUSCULAR | Status: AC
  Administered 2021-07-29: 6 mg via INTRAVENOUS
  Filled 2021-07-29: qty 1

## 2021-07-29 MED ORDER — GUAIFENESIN 100 MG/5ML PO SOLN
200.0000 mg | ORAL | Status: DC | PRN
  Filled 2021-07-29: qty 10

## 2021-07-29 MED ORDER — SODIUM CHLORIDE 0.9 % IV SOLN: 500.0000 mg | Freq: Once | INTRAVENOUS | Status: DC

## 2021-07-29 MED ORDER — SODIUM CHLORIDE 0.9 % IV SOLN
100.0000 mg | Freq: Every day | INTRAVENOUS | Status: DC
  Administered 2021-07-30 – 2021-08-01 (×3): 100 mg via INTRAVENOUS
  Filled 2021-07-29 (×2): qty 20
  Filled 2021-07-29: qty 100
  Filled 2021-07-29 (×3): qty 20

## 2021-07-29 MED ORDER — SODIUM CHLORIDE 0.9 % IV SOLN
200.0000 mg | Freq: Once | INTRAVENOUS | Status: AC
  Administered 2021-07-29: 200 mg via INTRAVENOUS
  Filled 2021-07-29: qty 200

## 2021-07-29 MED ORDER — FINASTERIDE 5 MG PO TABS
5.0000 mg | ORAL_TABLET | Freq: Every day | ORAL | Status: DC
  Administered 2021-07-29 – 2021-08-01 (×4): 5 mg via ORAL
  Filled 2021-07-29 (×5): qty 1

## 2021-07-29 MED ORDER — PANTOPRAZOLE SODIUM 40 MG PO TBEC: 80.0000 mg | DELAYED_RELEASE_TABLET | Freq: Every day | ORAL | Status: DC

## 2021-07-29 MED ORDER — ASPIRIN 81 MG PO CHEW
324.0000 mg | CHEWABLE_TABLET | Freq: Once | ORAL | Status: AC
  Administered 2021-07-29: 324 mg via ORAL
  Filled 2021-07-29: qty 4

## 2021-07-29 MED ORDER — APIXABAN 2.5 MG PO TABS
2.5000 mg | ORAL_TABLET | Freq: Two times a day (BID) | ORAL | Status: DC
  Administered 2021-07-30 – 2021-08-01 (×5): 2.5 mg via ORAL
  Filled 2021-07-29 (×7): qty 1

## 2021-07-29 MED ORDER — SODIUM CHLORIDE 0.9 % IV SOLN
1.0000 g | INTRAVENOUS | Status: DC
  Administered 2021-07-30 – 2021-07-31 (×2): 1 g via INTRAVENOUS
  Filled 2021-07-29 (×2): qty 10
  Filled 2021-07-29: qty 1

## 2021-07-29 NOTE — Progress Notes (Signed)
CODE SEPSIS - PHARMACY COMMUNICATION  **Broad Spectrum Antibiotics should be administered within 1 hour of Sepsis diagnosis**  Time Code Sepsis Called/Page Received: 1452  Antibiotics Ordered: ceftriaxone 1 gram x 1  Time of 1st antibiotic administration: 1532  Additional action taken by pharmacy: None  If necessary, Name of Provider/Nurse Contacted: N/a    Wynelle Cleveland, PharmD Pharmacy Resident  07/29/2021 2:48 PM

## 2021-07-29 NOTE — ED Notes (Signed)
Unable to obtain telesitter camera at this time. [facility] called and reports that the hospital is out of them.

## 2021-07-29 NOTE — ED Notes (Signed)
Report called telesitter.

## 2021-07-29 NOTE — ED Triage Notes (Addendum)
Pt arrives via EMS from home- per the daughter pt needs to be seen d/t weakness from covid- pt states that he tested positive yesterday for covid- per pt he had a hard time getting off the toilet this AM d/t weakness- pt states he last took tylenol at 0800

## 2021-07-29 NOTE — H&P (Addendum)
History and Physical    Donald [name] ASN:[id number] DOB: 22-May-1933 DOA: 07/29/2021  Referring MD/NP/PA:   PCP: McLean-Scocuzza, Nino Glow, MD   Patient coming from:  The patient is coming from home.  At baseline, pt is independent for most of ADL.        Chief Complaint: Altered mental status, fever  HPI: Donald [name] is a 87 y.o. male with medical history significant of COPD not on oxygen at home, and remote tobacco abuse as well as lung cancer not currently receiving chemotherapy, HTN, HDL, TIA, anemia, arthritis, BPH and paroxysmal A. fib on Eliquis.  Presented With altered mental status, fever  When I saw patient, apparently altered mental status greatly improved, he is alert and oriented by 2, answer questions and follow commands although not a good historian.  NO Family at bedside,  Per patient, chart review, apparently patient started have some symptoms including nasal congestion, fever, cough, confusion on 9/13, total rating her to the ED on 9/15, he was prescribed molnupiravir EUA and discharged home, however his symptoms not improving, worsening altered mental status, still fever, shortness of breath, cough with some sputum production, still reports some back pain, otherwise patient reports no dizziness, chest pain, palpitation, abdominal pain,  nausea, vomiting, diarrhea, dysuria, leg swelling.   ED Course: pt was found to have T-max 103.2, heart rate 94-1 1 4, respirations 13-26, blood pressure 119/66-1 6 6/56, oxygen saturation 87% on room air, VBG showed 7.3 1/46/33/20 23.2/+3.2, glucose 113, BUN 33, creatinine 1.73, magnesium 2.2, otherwise unremarkable CMP, BNP 80.4, sensitive troponin 22, lactic acid 1.6, procalcitonin 0.16, WBC 10.7, hemoglobin 11.3, D-dimer 1.73, INR 1.2, unremarkable urine analysis, CT head: Negative Chest x-ray: Stable nodularity in the left apex better appreciated on previous CT imaging. No other interval changes or acute abnormalities. CT  chest PE: 1. No evidence acute pulmonary embolism. 2. New airspace consolidation in the LEFT lower lobe consistent with pulmonary infection. Favor bronchopneumonia over COVID pneumonia. Aspiration pneumonitis could have a similar pattern. 3. Interval expansion of previous hypermetabolic LEFT upper lobe nodule to band like curvilinear consolidation. Nodule was suspicious for bronchogenic carcinoma on PET-CT 07/11/2020. Query interval treatment. 4. Stable mediastinal adenopathy. EKG: Sinus tachycardia, RBBB, QTC 503 ms  In ED patient was given Rocephin, azithromycin, Tylenol, albuterol, remdesivir Hospitalist was requested to admit patient.  Most recently home medication list from office visit at [facility] on 06/19/2021 Medication List    Accurate as of June 19, 2021 11:40 AM. If you have any questions, ask your nurse or doctor.     CONTINUE taking these medications  albuterol 90 mcg/actuation inhaler  apixaban 2.5 mg tablet Commonly known as: ELIQUIS Take 1 tablet (2.5 mg total) by mouth 2 (two) times daily  atorvastatin 40 MG tablet Commonly known as: LIPITOR Take 1 tablet (40 mg total) by mouth once daily  cholecalciferol 1000 unit tablet Commonly known as: VITAMIN D3  DULoxetine 30 MG DR capsule Commonly known as: CYMBALTA  finasteride 5 mg tablet Commonly known as: PROSCAR  ketoconazole 2 % cream Commonly known as: NIZORAL  lisinopriL 20 MG tablet Commonly known as: ZESTRIL TAKE 1 TABLET BY MOUTH EVERY DAY  metoprolol tartrate 50 MG tablet Commonly known as: LOPRESSOR Take 1 tablet (50 mg total) by mouth 2 (two) times daily  mv-min-folic-lycop-lut-herb178 200-175-250 mcg Tab  omeprazole 20 MG DR capsule Commonly known as: PriLOSEC  tamsulosin 0.4 mg capsule Commonly known as: FLOMAX  tiotropium-olodateroL 2.5-2.5 mcg/actuation inhaler Commonly known as: STIOLTO RESPIMAT  TORsemide 20 MG tablet Commonly known as: DEMADEX Take 1 tablet (20 mg total) by  mouth once daily  valACYclovir 500 MG tablet Commonly known as: VALTREX Review of Systems:   General: no fevers, chills, no body weight gain, has poor appetite, has fatigue HEENT: no blurry vision, hearing changes or sore throat Respiratory: no dyspnea, coughing, wheezing CV: no chest pain, no palpitations GI: no nausea, vomiting, abdominal pain, diarrhea, constipation GU: no dysuria, burning on urination, increased urinary frequency, hematuria  Ext: no leg edema Neuro: no unilateral weakness, numbness, or tingling, no vision change or hearing loss Skin: no rash, no skin tear. MSK: No muscle spasm, no deformity, no limitation of range of movement in spin Heme: No easy bruising.  Travel history: No recent long distant travel.  Allergy:  Allergies  Allergen Reactions   Cephalexin Rash    Past Medical History:  Diagnosis Date   Anemia    Arthritis    BPH (benign prostatic hyperplasia)    Cancer (HCC)    Lung   COPD (chronic obstructive pulmonary disease) (HCC)    Emphysema of lung (HCC)    Emphysema of lung (HCC)    Esophageal reflux    Hyperlipidemia    Hypertension    Internal carotid artery stenosis, right    Neuropathy of both feet    Pneumonia    recurrent   PTSD (post-traumatic stress disorder)    [place] vet   Sebaceous cyst    SOB (shortness of breath) on exertion    TIA (transient ischemic attack)    Urge incontinence     Past Surgical History:  Procedure Laterality Date   CYST EXCISION     buttocks   ELECTROMAGNETIC NAVIGATION BROCHOSCOPY Left 08/23/2019   Procedure: ELECTROMAGNETIC NAVIGATION BRONCHOSCOPY;  Surgeon: Tyler Pita, MD;  Location: [facility] ORS;  Service: Cardiopulmonary;  Laterality: Left;   ENDOBRONCHIAL ULTRASOUND N/A 08/23/2019   Procedure: ENDOBRONCHIAL ULTRASOUND;  Surgeon: Tyler Pita, MD;  Location: [facility] ORS;  Service: Cardiopulmonary;  Laterality: N/A;   HEMORRHOID SURGERY     shave biopsy  03/13/2020   left neck  03/13/20 [place] [facility] wart with atypica inflamed +AK no carcinoma    SINUSOTOMY     VIDEO BRONCHOSCOPY WITH ENDOBRONCHIAL NAVIGATION Left 06/26/2020   Procedure: VIDEO BRONCHOSCOPY WITH ENDOBRONCHIAL NAVIGATION;  Surgeon: Tyler Pita, MD;  Location: [facility] ORS;  Service: Pulmonary;  Laterality: Left;    Social History:  reports that he quit smoking about 21 years ago. His smoking use included cigarettes. He has a 75.00 pack-year smoking history. He has never used smokeless tobacco. He reports that he does not drink alcohol and does not use drugs.  Family History:  Family History  Problem Relation Age of Onset   Heart disease Mother    Cancer Maternal Aunt        lung   Lung cancer Maternal Aunt    Leukemia Maternal Aunt    Hypertension Son      Prior to Admission medications   Medication Sig Start Date End Date Taking? Authorizing Provider  albuterol (VENTOLIN HFA) 108 (90 Base) MCG/ACT inhaler Inhale 2 puffs into the lungs every 6 (six) hours as needed for wheezing or shortness of breath. 08/12/19   Tyler Pita, MD  apixaban (ELIQUIS) 2.5 MG TABS tablet Take 1 tablet (2.5 mg total) by mouth 2 (two) times daily. 05/31/21   McLean-Scocuzza, Nino Glow, MD  aspirin 325 MG tablet Take 650 mg by mouth every  6 (six) hours as needed for mild pain or fever.    [provider]  atorvastatin (LIPITOR) 80 MG tablet Take 40 mg by mouth daily. Patient not taking: Reported on 07/11/2021    [provider]  cetirizine (ZYRTEC) 10 MG tablet Take 10 mg by mouth daily.    [provider]  cholecalciferol (VITAMIN D) 1000 UNITS tablet Take 1,000 Units by mouth daily.    [provider]  DULoxetine (CYMBALTA) 30 MG capsule Take 30 mg by mouth 2 (two) times daily.    [provider]  famotidine (PEPCID) 20 MG tablet Take 1 tablet (20 mg total) by mouth daily as needed for heartburn or indigestion. 02/28/20   McLean-Scocuzza, Nino Glow, MD  finasteride  (PROSCAR) 5 MG tablet Take 5 mg by mouth daily. Patient not taking: Reported on 07/11/2021    [provider]  Fluticasone-Umeclidin-Vilant (TRELEGY ELLIPTA) 100-62.5-25 MCG/INH AEPB Inhale 1 puff into the lungs daily. Rinse mouth Samples of this drug were given to the patient, quantity 1, Lot Number [id number] 02/2022 Patient not taking: No sig reported 10/31/20   McLean-Scocuzza, Nino Glow, MD  gabapentin (NEURONTIN) 300 MG capsule Take 1 capsule (300 mg total) by mouth at bedtime. Patient not taking: No sig reported 02/22/20   McLean-Scocuzza, Nino Glow, MD  glucose blood test strip 1 each by Other route as needed. Use as instructed Patient not taking: No sig reported 06/08/20   McLean-Scocuzza, Nino Glow, MD  hydrochlorothiazide (HYDRODIURIL) 25 MG tablet Take 0.5 tablets (12.5 mg total) by mouth daily. Patient not taking: No sig reported 02/28/20   McLean-Scocuzza, Nino Glow, MD  lisinopril (ZESTRIL) 20 MG tablet Take 20 mg by mouth daily. Patient not taking: Reported on 07/11/2021    [provider]  metoprolol tartrate (LOPRESSOR) 50 MG tablet Take 1 tablet by mouth 2 (two) times daily. 02/06/21 02/06/22  [provider]  molnupiravir EUA (LAGEVRIO) 200 mg CAPS capsule Take 4 capsules (800 mg total) by mouth 2 (two) times daily for 5 days. 07/26/21 07/31/21  Melynda Ripple, MD  Multiple Vitamins-Minerals (MEGA MULTIVITAMIN FOR MEN) TABS Take 1 tablet by mouth daily.  Patient not taking: Reported on 07/11/2021    [provider]  mupirocin ointment (BACTROBAN) 2 % Apply 1 application topically 2 (two) times daily. Prn left leg Patient not taking: Reported on 07/11/2021 05/31/21   McLean-Scocuzza, Nino Glow, MD  omeprazole (PRILOSEC) 40 MG capsule Take 40 mg by mouth daily.    [provider]  tamsulosin (FLOMAX) 0.4 MG CAPS capsule Take 1 capsule (0.4 mg total) by mouth daily. Patient not taking: Reported on 07/11/2021 09/06/19   Debroah Loop, PA-C   Tiotropium Bromide-Olodaterol 2.5-2.5 MCG/ACT AERS Inhale 2 puffs into the lungs daily. 02/28/20   McLean-Scocuzza, Nino Glow, MD  torsemide (DEMADEX) 20 MG tablet Take 20 mg by mouth daily. Patient not taking: Reported on 07/11/2021    [provider]    Physical Exam: Vitals:   07/29/21 1230 07/29/21 1245 07/29/21 1300 07/29/21 1400  BP: (!) 147/55 (!) 134/52 (!) 121/51 (!) 141/66  Pulse: 100 94 96 94  Resp: (!) 21 15 13 13   Temp:      TempSrc:      SpO2: 97% 98% 97% 96%  Weight:      Height:       General: Not in acute distress HEENT:       Eyes: PERRL, EOMI, no scleral icterus.       ENT:  No discharge from the ears and nose, no pharynx injection, no tonsillar enlargement.        Neck: No JVD, no bruit, no mass felt. Heme: No neck lymph node enlargement. Cardiac: S1/S2, RRR, No murmurs, No gallops or rubs. Respiratory: Coarse breath sounds bilaterally.  No significant wheezing  GI: Soft, nondistended, nontender, no rebound pain, no organomegaly, BS present. GU: No hematuria Ext: No pitting leg edema bilaterally. 2+DP/PT pulse bilaterally. Musculoskeletal: No joint deformities, No joint redness or warmth, no limitation of ROM in spin. Skin: No rashes.  Neuro: Alert, oriented X3, cranial nerves II-XII grossly intact, moves all extremities normally. Muscle strength 5/5 in all extremities, sensation to light touch intact. Brachial reflex 2+ bilaterally. Knee reflex 1+ bilaterally. Negative Babinski's sign. Normal finger to nose test. Psych: Patient is not psychotic, no suicidal or hemocidal ideation.  Labs on Admission: I have personally reviewed following labs and imaging studies  CBC: Recent Labs  Lab 07/29/21 0958  WBC 10.7*  HGB 11.3*  HCT 34.4*  MCV 89.6  PLT 599   Basic Metabolic Panel: Recent Labs  Lab 07/29/21 0958 07/29/21 1147  NA 135  --   K 4.7  --   CL 102  --   CO2 23  --   GLUCOSE 113*  --   BUN 33*  --   CREATININE 1.73*  --   CALCIUM 9.3   --   MG  --  2.2   GFR: Estimated Creatinine Clearance: 29.5 mL/min (A) (by C-G formula based on SCr of 1.73 mg/dL (H)). Liver Function Tests: Recent Labs  Lab 07/29/21 1006  AST 27  ALT 19  ALKPHOS 77  BILITOT 0.8  PROT 7.0  ALBUMIN 3.8   No results for input(s): LIPASE, AMYLASE in the last 168 hours. No results for input(s): AMMONIA in the last 168 hours. Coagulation Profile: Recent Labs  Lab 07/29/21 1147  INR 1.2   Cardiac Enzymes: No results for input(s): CKTOTAL, CKMB, CKMBINDEX, TROPONINI in the last 168 hours. BNP (last 3 results) No results for input(s): PROBNP in the last 8760 hours. HbA1C: No results for input(s): HGBA1C in the last 72 hours. CBG: No results for input(s): GLUCAP in the last 168 hours. Lipid Profile: No results for input(s): CHOL, HDL, LDLCALC, TRIG, CHOLHDL, LDLDIRECT in the last 72 hours. Thyroid Function Tests: No results for input(s): TSH, T4TOTAL, FREET4, T3FREE, THYROIDAB in the last 72 hours. Anemia Panel: No results for input(s): VITAMINB12, FOLATE, FERRITIN, TIBC, IRON, RETICCTPCT in the last 72 hours. Urine analysis:    Component Value Date/Time   COLORURINE YELLOW 07/29/2021 [place] 07/29/2021 1147   APPEARANCEUR Clear 06/22/2020 1008   LABSPEC 1.025 07/29/2021 1147   PHURINE 5.5 07/29/2021 1147   GLUCOSEU NEGATIVE 07/29/2021 1147   [place] 07/29/2021 1147   [place] 07/29/2021 1147   BILIRUBINUR Negative 06/22/2020 1008   KETONESUR NEGATIVE 07/29/2021 1147   PROTEINUR 30 (A) 07/29/2021 1147   UROBILINOGEN 0.2 05/05/2013 1134   NITRITE NEGATIVE 07/29/2021 1147   LEUKOCYTESUR NEGATIVE 07/29/2021 1147   Sepsis Labs: @LABRCNTIP (procalcitonin:4,lacticidven:4) )No results found for this or any previous visit (from the past 240 hour(s)).   Radiological Exams on Admission: DG Chest 2 View  Result Date: 07/29/2021 CLINICAL DATA:  Cough. EXAM: CHEST - 2 VIEW COMPARISON:  July 26, 2021 FINDINGS: Nodularity in the left apex is similar since July 26, 2021 and better appreciated on previous CT imaging. Mild atelectasis in the left base. The  lungs are otherwise clear. Stable cardiomegaly. The hila and mediastinum are unremarkable. IMPRESSION: Stable nodularity in the left apex better appreciated on previous CT imaging. No other interval changes or acute abnormalities. Electronically Signed   By: Dorise Bullion III M.D.   On: 07/29/2021 11:04   CT HEAD WO CONTRAST (5MM)  Result Date: 07/29/2021 CLINICAL DATA:  Mental status change. EXAM: CT HEAD WITHOUT CONTRAST TECHNIQUE: Contiguous axial images were obtained from the base of the skull through the vertex without intravenous contrast. COMPARISON:  June 13, 2021 FINDINGS: Brain: No subdural, epidural, or subarachnoid hemorrhage identified. No mass effect or midline shift. Ventricles and sulci are stable. A lacunar infarct is identified on the right either in or adjacent to the caudate head on series 2, image 20. Another basal gangliar lacunar infarct is seen on series 2, image 17. White matter changes are identified. No acute cortical ischemia or infarct. Cerebellum, brainstem, and basal cisterns are normal. Vascular: No hyperdense vessel or unexpected calcification. Skull: Normal. Negative for fracture or focal lesion. Sinuses/Orbits: Mucosal thickening identified in the maxillary sinuses, the ethmoid air cells, in the frontal sinuses. Other: None. IMPRESSION: No acute intracranial abnormalities identified to explain the patient's mental status change. Electronically Signed   By: Dorise Bullion III M.D.   On: 07/29/2021 13:54   CT Angio Chest PE W and/or Wo Contrast  Result Date: 07/29/2021 CLINICAL DATA:  Weakness.  Positive COVID. EXAM: CT ANGIOGRAPHY CHEST WITH CONTRAST TECHNIQUE: Multidetector CT imaging of the chest was performed using the standard protocol during bolus administration of intravenous contrast. Multiplanar CT  image reconstructions and MIPs were obtained to evaluate the vascular anatomy. CONTRAST:  8mL OMNIPAQUE IOHEXOL 350 MG/ML SOLN COMPARISON:  CT 06/08/2020 FINDINGS: Cardiovascular: No filling defects within the pulmonary arteries to suggest acute pulmonary embolism. Mediastinum/Nodes: Enlarged mediastinal lymph nodes similar to prior. RIGHT paratracheal node measures 17 mm compared to 19 mm. Subcarinal node measures 25 mm compared with 25 mm. Lungs/Pleura: Band of curvilinear consolidation in the LEFT upper lobe measuring 2.8 x 1.7 cm is at site of previous nodule on comparison CT measuring 1.2 cm. This nodule is hypermetabolic on comparison PET-CT scan. New peribronchial segmental nodular consolidation in the LEFT lower lobe (image 54/9). RIGHT lung relatively clear. Interval resolution of tree-in-bud pattern previously seen in the RIGHT lower lobe. Upper Abdomen: Limited view of the liver, kidneys, pancreas are unremarkable. Normal adrenal glands. Musculoskeletal: No aggressive osseous lesion. Review of the MIP images confirms the above findings. IMPRESSION: 1. No evidence acute pulmonary embolism. 2. New airspace consolidation in the LEFT lower lobe consistent with pulmonary infection. Favor bronchopneumonia over COVID pneumonia. Aspiration pneumonitis could have a similar pattern. 3. Interval expansion of previous hypermetabolic LEFT upper lobe nodule to band like curvilinear consolidation. Nodule was suspicious for bronchogenic carcinoma on PET-CT 07/11/2020. Query interval treatment. 4. Stable mediastinal adenopathy. Electronically Signed   By: Suzy Bouchard M.D.   On: 07/29/2021 14:08     EKG: Independently reviewed.  Not done in ED, will get one.   Assessment/Plan Principal Problem:   COVID-19 virus infection Active Problems:   Sepsis ([place])   PNA (pneumonia)   AMS (altered mental status)   Hypoxemia  #Possible Sepsis:  Evidence of fever, tachycardia, also tachycardia could be due to A.  fib secondary to?  COVID-19, possible pneumonia 30 ml/kg IVF bolus ordered the ED Lactate 1.6 Empirically Antibiotics Rocephin and Doxy given, will continue Blood Culture Pending Can culture Pending  #COVID-19:  Patient have  cough, fever, altered mental status COVID19 test positive, chest x-ray shows left lung base consolidation.   Now requiring 2 L nasal cannula oxygen. Admit telemetry, RT manage oxygen, breath treatment Patient have  comorbidities CAD, hypertension, copd Elevated D-dimer, CTa no PE patient on Eliquis  Pending CK, troponin, for risks stratification Symptomatic and supportive management. Start Decadron and remdesivir in ED, will continue  #possible Pneumonia: evidence of CT Could be due to COVID, although not typical.  Other etiologies including bacterial pneumonia versus aspiration however patient denies any vomiting Sputum Culture Pending urine for legionella and strep pneumo antigens if warranted. IV Rocephin and Doxycycline. Oxygen as Need  Duonebs PRN  Guaifenesin for cough. consider CT-chest for further evaluation if warranted May need a follow up CXR in few weeks to ensure resolution  #Altered mental status: Greatly resolving.  Likely encephalopathy secondary to above. CT head negative Close monitoring   #Possible worsening CKD stage III: Creatinine 1.73, 1.53 on 05/2021, Likely secondary to dehydration/low p.o. intake secondary to above Gental IV fluids Hold lisinopril Close monitoring  #COPD: No sign of exacerbation  #Prolonged QT: Avoid medication prolonged QTC  #history significant of COPD not on oxygen at home, and remote tobacco abuse as well as lung cancer not currently receiving chemotherapy, HTN, HDL, TIA, anemia, arthritis, BPH and paroxysmal A. fib on Eliquis.  Presented With altered mental status, fever Established Active problems see above Continue home medications if appropriate follow-up with his PCP, specialist when  necessary   DVT ppx: Other anticoagulation  code Status: Full code Family Communication: None at bed side.         Disposition Plan:  Anticipate discharge back to previous home environment Consults called:   Admission status:   SDU/inpation       Date of Service 07/29/2021    Lenna Sciara [facility]   If 7PM-7AM, please contact night-coverage [email or web address] Password [employer] 07/29/2021, 3:05 PM

## 2021-07-29 NOTE — Sepsis Progress Note (Signed)
[facility] is following this code sepsis ?

## 2021-07-29 NOTE — Progress Notes (Signed)
Remdesivir - Pharmacy Brief Note   O:  ALT: 19 CXR: No active cardiopulmonary disease.  Left apical nodules. SpO2: 97% on RA   A/P:  Remdesivir 200 mg IVPB once followed by 100 mg IVPB daily x 4 days.   Pearla Dubonnet, PharmD Clinical Pharmacist 07/29/2021 2:13 PM

## 2021-07-29 NOTE — ED Provider Notes (Signed)
[facility] Emergency Department Provider Note  ____________________________________________   Event Date/Time   First MD Initiated Contact with Patient 07/29/21 1053     (approximate)  I have reviewed the triage vital signs and the nursing notes.   HISTORY  Chief Complaint Covid Positive   HPI Donald [name] is a 87 y.o. male with a past medical history of COPD and remote tobacco abuse as well as lung cancer not currently receiving chemotherapy, HTN, HDL, TIA, anemia, arthritis, BPH and paroxysmal A. fib on Eliquis who presents via EMS from home after they were called by patient's daughter with concerns that he developed worsening confusion over the last couple days after recently diagnosed with COVID on 9/15.  Is not sure why he is in emergency room and history is somewhat limited from him secondary to his altered mental status.  He is denying any acute pain.  Per daughter he had been living at home with his wife who had been taking care of she is currently in the hospital with COVID as well.  Patient has diagnosis of very early dementia and has some mild forgetfulness at baseline but typically not nearly as confused as he has been today and yesterday.  She is not aware of any recent falls.  She notes that several years ago he was on oxygen but has not had any oxygen requirement of the last 2 years.  She is not aware of any other acute sick symptoms although notes he has been coughing a fair amount.  No further history is available patient arrival.         Past Medical History:  Diagnosis Date   Anemia    Arthritis    BPH (benign prostatic hyperplasia)    Cancer (HCC)    Lung   COPD (chronic obstructive pulmonary disease) (HCC)    Emphysema of lung (HCC)    Emphysema of lung (HCC)    Esophageal reflux    Hyperlipidemia    Hypertension    Internal carotid artery stenosis, right    Neuropathy of both feet    Pneumonia    recurrent   PTSD  (post-traumatic stress disorder)    [place] vet   Sebaceous cyst    SOB (shortness of breath) on exertion    TIA (transient ischemic attack)    Urge incontinence     Patient Active Problem List   Diagnosis Date Noted   At high risk for falls 06/07/2021   Hypertension associated with diabetes ([place]) 06/07/2021   Dizziness 06/01/2021   Cerebral atherosclerosis 06/01/2021   Bronchogenic cancer of left lung ([place]) 10/31/2020   Lymphadenopathy 10/31/2020   Aortic atherosclerosis ([place]) 10/31/2020   Coronary artery disease involving native coronary artery of native heart without angina pectoris 10/31/2020   Mild cardiomegaly 10/31/2020   Hiatal hernia 10/31/2020   Diverticulosis 10/31/2020   Enlarged prostate 10/31/2020   Benign prostatic hyperplasia with incomplete bladder emptying 04/27/2020   Bladder outlet obstruction 04/27/2020   Memory loss 04/27/2020   Prediabetes 04/27/2020   Lung nodules 04/27/2020   Pulmonary emphysema ([place]) 04/27/2020   Anxiety and depression 02/22/2020   Left foot pain 02/22/2020   Type 2 diabetes mellitus with hyperglycemia, without long-term current use of insulin ([place]) 02/22/2020   Left upper lobe pulmonary nodule 08/10/2019   Frequent falls 04/13/2019   Abnormal gait 04/13/2019   History of stroke 12/29/2018   Falling episodes 12/29/2018   Bilateral carotid artery stenosis 04/01/2018   Stroke (cerebrum) ([place])  03/19/2018   Leukocytosis 03/19/2018   CLL (chronic lymphocytic leukemia) ([place]) 03/19/2018   Fall 03/04/2018   Rash 03/04/2018   Recurrent pneumonia 01/03/2018   Mouth lesion 12/23/2017   Hoarseness 12/23/2017   Diabetes mellitus without complication ([place]) 19/14/7829   Chronic back pain 03/05/2017   Pain in joint involving left lower leg 08/26/2016   GERD (gastroesophageal reflux disease) 08/05/2016   Nail anomaly 05/20/2016   Obesity (BMI 30-39.9) 06/20/2014   Dermatitis 02/22/2014   COPD exacerbation ([place]) 10/12/2013   Chronic  obstructive pulmonary disease ([place]) 10/12/2013   Bradycardia 08/31/2013   History of smoking 08/11/2013   Personal history of tobacco use, presenting hazards to health 08/11/2013   SOBOE (shortness of breath on exertion) 08/06/2013   Dysphagia, pharyngoesophageal phase 05/05/2013   Vesicular palmoplantar eczema 11/20/2012   Chronic prostatitis 09/11/2012   Elevated prostate specific antigen (PSA) 09/11/2012   Herpetic infection of penis 09/11/2012   ED (erectile dysfunction) of organic origin 09/11/2012   Incomplete emptying of bladder 09/11/2012   Nodular prostate with urinary obstruction 09/11/2012   Testicular hypofunction 09/11/2012   Urge incontinence 09/11/2012   Posttraumatic stress disorder 12/23/2011   Familial multiple lipoprotein-type hyperlipidemia 12/11/2011   Essential hypertension 09/10/2011    Past Surgical History:  Procedure Laterality Date   CYST EXCISION     buttocks   ELECTROMAGNETIC NAVIGATION BROCHOSCOPY Left 08/23/2019   Procedure: ELECTROMAGNETIC NAVIGATION BRONCHOSCOPY;  Surgeon: Tyler Pita, MD;  Location: [facility] ORS;  Service: Cardiopulmonary;  Laterality: Left;   ENDOBRONCHIAL ULTRASOUND N/A 08/23/2019   Procedure: ENDOBRONCHIAL ULTRASOUND;  Surgeon: Tyler Pita, MD;  Location: [facility] ORS;  Service: Cardiopulmonary;  Laterality: N/A;   HEMORRHOID SURGERY     shave biopsy  03/13/2020   left neck 03/13/20 [facility] wart with atypica inflamed +AK no carcinoma    SINUSOTOMY     VIDEO BRONCHOSCOPY WITH ENDOBRONCHIAL NAVIGATION Left 06/26/2020   Procedure: VIDEO BRONCHOSCOPY WITH ENDOBRONCHIAL NAVIGATION;  Surgeon: Tyler Pita, MD;  Location: [facility] ORS;  Service: Pulmonary;  Laterality: Left;    Prior to Admission medications   Medication Sig Start Date End Date Taking? Authorizing Provider  albuterol (VENTOLIN HFA) 108 (90 Base) MCG/ACT inhaler Inhale 2 puffs into the lungs every 6 (six) hours as needed for wheezing or shortness  of breath. 08/12/19   Tyler Pita, MD  apixaban (ELIQUIS) 2.5 MG TABS tablet Take 1 tablet (2.5 mg total) by mouth 2 (two) times daily. 05/31/21   McLean-Scocuzza, Nino Glow, MD  aspirin 325 MG tablet Take 650 mg by mouth every 6 (six) hours as needed for mild pain or fever.    [provider]  atorvastatin (LIPITOR) 80 MG tablet Take 40 mg by mouth daily. Patient not taking: Reported on 07/11/2021    [provider]  cetirizine (ZYRTEC) 10 MG tablet Take 10 mg by mouth daily.    [provider]  cholecalciferol (VITAMIN D) 1000 UNITS tablet Take 1,000 Units by mouth daily.    [provider]  DULoxetine (CYMBALTA) 30 MG capsule Take 30 mg by mouth 2 (two) times daily.    [provider]  famotidine (PEPCID) 20 MG tablet Take 1 tablet (20 mg total) by mouth daily as needed for heartburn or indigestion. 02/28/20   McLean-Scocuzza, Nino Glow, MD  finasteride (PROSCAR) 5 MG tablet Take 5 mg by mouth daily. Patient not taking: Reported on 07/11/2021    [provider]  Fluticasone-Umeclidin-Vilant (TRELEGY ELLIPTA) 100-62.5-25 MCG/INH AEPB  Inhale 1 puff into the lungs daily. Rinse mouth Samples of this drug were given to the patient, quantity 1, Lot Number [id number] 02/2022 Patient not taking: No sig reported 10/31/20   McLean-Scocuzza, Nino Glow, MD  gabapentin (NEURONTIN) 300 MG capsule Take 1 capsule (300 mg total) by mouth at bedtime. Patient not taking: No sig reported 02/22/20   McLean-Scocuzza, Nino Glow, MD  glucose blood test strip 1 each by Other route as needed. Use as instructed Patient not taking: No sig reported 06/08/20   McLean-Scocuzza, Nino Glow, MD  hydrochlorothiazide (HYDRODIURIL) 25 MG tablet Take 0.5 tablets (12.5 mg total) by mouth daily. Patient not taking: No sig reported 02/28/20   McLean-Scocuzza, Nino Glow, MD  lisinopril (ZESTRIL) 20 MG tablet Take 20 mg by mouth daily. Patient not taking: Reported on 07/11/2021    [provider]  metoprolol tartrate (LOPRESSOR) 50 MG tablet Take 1 tablet by mouth 2 (two) times daily. 02/06/21 02/06/22  [provider]  molnupiravir EUA (LAGEVRIO) 200 mg CAPS capsule Take 4 capsules (800 mg total) by mouth 2 (two) times daily for 5 days. 07/26/21 07/31/21  Melynda Ripple, MD  Multiple Vitamins-Minerals (MEGA MULTIVITAMIN FOR MEN) TABS Take 1 tablet by mouth daily.  Patient not taking: Reported on 07/11/2021    [provider]  mupirocin ointment (BACTROBAN) 2 % Apply 1 application topically 2 (two) times daily. Prn left leg Patient not taking: Reported on 07/11/2021 05/31/21   McLean-Scocuzza, Nino Glow, MD  omeprazole (PRILOSEC) 40 MG capsule Take 40 mg by mouth daily.    [provider]  tamsulosin (FLOMAX) 0.4 MG CAPS capsule Take 1 capsule (0.4 mg total) by mouth daily. Patient not taking: Reported on 07/11/2021 09/06/19   Debroah Loop, PA-C  Tiotropium Bromide-Olodaterol 2.5-2.5 MCG/ACT AERS Inhale 2 puffs into the lungs daily. 02/28/20   McLean-Scocuzza, Nino Glow, MD  torsemide (DEMADEX) 20 MG tablet Take 20 mg by mouth daily. Patient not taking: Reported on 07/11/2021    [provider]    Allergies Cephalexin  Family History  Problem Relation Age of Onset   Heart disease Mother    Cancer Maternal Aunt        lung   Lung cancer Maternal Aunt    Leukemia Maternal Aunt    Hypertension Son     Social History Social History   Tobacco Use   Smoking status: Former    Packs/day: 1.50    Years: 50.00    Pack years: 75.00    Types: Cigarettes    Quit date: 09/10/1999    Years since quitting: 21.8   Smokeless tobacco: Never  Vaping Use   Vaping Use: Never used  Substance Use Topics   Alcohol use: No    Alcohol/week: 0.0 standard drinks    Comment: not since 12/1980   Drug use: No    Review of Systems  Review of Systems  Unable to perform ROS: Mental status change  Respiratory:  Positive for cough.    Psychiatric/Behavioral:  Positive for memory loss.      ____________________________________________   PHYSICAL EXAM:  VITAL SIGNS: ED Triage Vitals  Enc Vitals Group     BP 07/29/21 0951 140/84     Pulse Rate 07/29/21 0951 (!) 105     Resp 07/29/21 0951 20     Temp 07/29/21 0951 (!) 103.2 F (39.6 C)     Temp Source 07/29/21 0951 Oral     SpO2 07/29/21 0951 93 %  Weight 07/29/21 0953 150 lb (68 kg)     Height 07/29/21 0953 5\' 8"  (1.727 m)     Head Circumference --      Peak Flow --      Pain Score 07/29/21 0953 0     Pain Loc --      Pain Edu? --      Excl. in [place]? --    Vitals:   07/29/21 1300 07/29/21 1400  BP: (!) 121/51 (!) 141/66  Pulse: 96 94  Resp: 13 13  Temp:    SpO2: 97% 96%   Physical Exam Vitals and nursing note reviewed.  Constitutional:      Appearance: He is well-developed.  HENT:     Head: Normocephalic and atraumatic.     Right Ear: External ear normal.     Left Ear: External ear normal.  Eyes:     Conjunctiva/sclera: Conjunctivae normal.  Cardiovascular:     Rate and Rhythm: Regular rhythm. Tachycardia present.     Heart sounds: No murmur heard. Pulmonary:     Effort: Tachypnea and respiratory distress present.     Breath sounds: Normal breath sounds.  Abdominal:     Palpations: Abdomen is soft.     Tenderness: There is no abdominal tenderness.  Musculoskeletal:     Cervical back: Neck supple.  Skin:    General: Skin is warm and dry.  Neurological:     Mental Status: He is alert. He is disoriented and confused.    Patient is able to give examiner thumbs up with both hands and move his feet on command.  Sensation is intact to light touch lower extremities.  PERRLA.  EOMI.  Cranial nerves II through XII are grossly intact. ____________________________________________   LABS (all labs ordered are listed, but only abnormal results are displayed)  Labs Reviewed  BASIC METABOLIC PANEL - Abnormal; Notable for the following  components:      Result Value   Glucose, Bld 113 (*)    BUN 33 (*)    Creatinine, Ser 1.73 (*)    GFR, Estimated 38 (*)    All other components within normal limits  CBC - Abnormal; Notable for the following components:   WBC 10.7 (*)    RBC 3.84 (*)    Hemoglobin 11.3 (*)    HCT 34.4 (*)    All other components within normal limits  URINALYSIS, COMPLETE (UACMP) WITH MICROSCOPIC - Abnormal; Notable for the following components:   Protein, ur 30 (*)    Bacteria, UA RARE (*)    All other components within normal limits  BLOOD GAS, VENOUS - Abnormal; Notable for the following components:   Acid-base deficit 3.2 (*)    All other components within normal limits  D-DIMER, QUANTITATIVE (NOT AT [facility]) - Abnormal; Notable for the following components:   D-Dimer, Quant 1.73 (*)    All other components within normal limits  TROPONIN I (HIGH SENSITIVITY) - Abnormal; Notable for the following components:   Troponin I (High Sensitivity) 22 (*)    All other components within normal limits  CULTURE, BLOOD (SINGLE)  URINE CULTURE  PROCALCITONIN  LACTIC ACID, PLASMA  PROTIME-INR  APTT  MAGNESIUM  BRAIN NATRIURETIC PEPTIDE  HEPATIC FUNCTION PANEL  LACTIC ACID, PLASMA  TROPONIN I (HIGH SENSITIVITY)   ____________________________________________  EKG Sinus tachycardia with a ventricular rate of 103, right bundle branch block and some nonspecific ST changes in inferior leads without other clearance of acute ischemia or significant arrhythmia.  QTc  interval is 503. ____________________________________________  RADIOLOGY  ED MD interpretation: There are some nodularity on chest x-ray in the left apex without focal consolidation, effusion, edema, pneumothorax or other acute thoracic process.  CT head shows no acute intracranial process including bleeding, subacute CVA or increased pressure or mass-effect.  CTA chest shows no evidence of PE but does show left lower lobe consolidation  concerning for bronchopneumonia versus aspiration pneumonitis.  There is also expansion of previous left upper lobe nodule and some stable mediastinal adenopathy.  No other acute thoracic process noted.  Official radiology report(s): DG Chest 2 View  Result Date: 07/29/2021 CLINICAL DATA:  Cough. EXAM: CHEST - 2 VIEW COMPARISON:  July 26, 2021 FINDINGS: Nodularity in the left apex is similar since July 26, 2021 and better appreciated on previous CT imaging. Mild atelectasis in the left base. The lungs are otherwise clear. Stable cardiomegaly. The hila and mediastinum are unremarkable. IMPRESSION: Stable nodularity in the left apex better appreciated on previous CT imaging. No other interval changes or acute abnormalities. Electronically Signed   By: Dorise Bullion III M.D.   On: 07/29/2021 11:04   CT HEAD WO CONTRAST (5MM)  Result Date: 07/29/2021 CLINICAL DATA:  Mental status change. EXAM: CT HEAD WITHOUT CONTRAST TECHNIQUE: Contiguous axial images were obtained from the base of the skull through the vertex without intravenous contrast. COMPARISON:  June 13, 2021 FINDINGS: Brain: No subdural, epidural, or subarachnoid hemorrhage identified. No mass effect or midline shift. Ventricles and sulci are stable. A lacunar infarct is identified on the right either in or adjacent to the caudate head on series 2, image 20. Another basal gangliar lacunar infarct is seen on series 2, image 17. White matter changes are identified. No acute cortical ischemia or infarct. Cerebellum, brainstem, and basal cisterns are normal. Vascular: No hyperdense vessel or unexpected calcification. Skull: Normal. Negative for fracture or focal lesion. Sinuses/Orbits: Mucosal thickening identified in the maxillary sinuses, the ethmoid air cells, in the frontal sinuses. Other: None. IMPRESSION: No acute intracranial abnormalities identified to explain the patient's mental status change. Electronically Signed   By: Dorise Bullion III M.D.   On: 07/29/2021 13:54   CT Angio Chest PE W and/or Wo Contrast  Result Date: 07/29/2021 CLINICAL DATA:  Weakness.  Positive COVID. EXAM: CT ANGIOGRAPHY CHEST WITH CONTRAST TECHNIQUE: Multidetector CT imaging of the chest was performed using the standard protocol during bolus administration of intravenous contrast. Multiplanar CT image reconstructions and MIPs were obtained to evaluate the vascular anatomy. CONTRAST:  109mL OMNIPAQUE IOHEXOL 350 MG/ML SOLN COMPARISON:  CT 06/08/2020 FINDINGS: Cardiovascular: No filling defects within the pulmonary arteries to suggest acute pulmonary embolism. Mediastinum/Nodes: Enlarged mediastinal lymph nodes similar to prior. RIGHT paratracheal node measures 17 mm compared to 19 mm. Subcarinal node measures 25 mm compared with 25 mm. Lungs/Pleura: Band of curvilinear consolidation in the LEFT upper lobe measuring 2.8 x 1.7 cm is at site of previous nodule on comparison CT measuring 1.2 cm. This nodule is hypermetabolic on comparison PET-CT scan. New peribronchial segmental nodular consolidation in the LEFT lower lobe (image 54/9). RIGHT lung relatively clear. Interval resolution of tree-in-bud pattern previously seen in the RIGHT lower lobe. Upper Abdomen: Limited view of the liver, kidneys, pancreas are unremarkable. Normal adrenal glands. Musculoskeletal: No aggressive osseous lesion. Review of the MIP images confirms the above findings. IMPRESSION: 1. No evidence acute pulmonary embolism. 2. New airspace consolidation in the LEFT lower lobe consistent with pulmonary infection. Favor bronchopneumonia over COVID pneumonia. Aspiration  pneumonitis could have a similar pattern. 3. Interval expansion of previous hypermetabolic LEFT upper lobe nodule to band like curvilinear consolidation. Nodule was suspicious for bronchogenic carcinoma on PET-CT 07/11/2020. Query interval treatment. 4. Stable mediastinal adenopathy. Electronically Signed   By: Suzy Bouchard  M.D.   On: 07/29/2021 14:08    ____________________________________________   PROCEDURES  Procedure(s) performed (including Critical Care):  .Critical Care Performed by: Lucrezia Starch, MD Authorized by: Lucrezia Starch, MD   Critical care provider statement:    Critical care time (minutes):  45   Critical care was necessary to treat or prevent imminent or life-threatening deterioration of the following conditions:  Respiratory failure   Critical care was time spent personally by me on the following activities:  Discussions with consultants, evaluation of patient's response to treatment, examination of patient, ordering and performing treatments and interventions, ordering and review of laboratory studies, ordering and review of radiographic studies, pulse oximetry, re-evaluation of patient's condition, obtaining history from patient or surrogate and review of old charts   ____________________________________________   INITIAL IMPRESSION / [place] / ED COURSE      Patient presents with above-stated history exam for assessment of some increased confusion last couple days in the setting of recently being diagnosed with COVID.  On arrival patient is febrile at 3.2, tachycardic to 105 and borderline tachypneic with respiratory of 20 and SPO2 of 93% in triage.  However when he is gone back to his room this is.  To is 87% on room air.  This improved to 98% consistently on 2 L.  Patient is confused not oriented.  However he has a nonfocal exam there is no evidence of obvious trauma on exam.  His lungs are relatively clear bilaterally and his abdomen is soft.  Certainly possible he is experiencing some encephalopathy related to acute COVID infection.  Additional differential includes other metabolic derangements, SAH versus encephalopathy from other acute metabolic derangement.  I have a lower suspicion for acute trauma or toxic ingestion.  BMP remarkable for no significant  electrolyte or metabolic derangements.  Kidney function is close to baseline with a creatinine of 1.73 compared to baseline ranging from 1.43-1.65 over the last year.  CBC shows leukocytosis with WBC count of 10.7, no acute anemia and normal platelets.  Procalcitonin is slightly elevated 0.16.  EKG has some nonspecific findings and a right bundle branch block.  Troponin above the upper limit normal at 22.  Suspect some mild demand ischemia and I have low suspicion for other ACS at this time.  There are some nodularity on chest x-ray in the left apex without focal consolidation, effusion, edema, pneumothorax or other acute thoracic process.  BNP is double digits and overall patient does not appear volume overloaded and I think he is experiencing heart failure at this time.  Hepatic function panel is unremarkable.  UA/suggestive of cystitis.  Lactic acid is nonelevated at 1.6.  VBG with a pH of 7.31 with a PCO2 of 46 and bicarb 23.2 is consistent with chronic compensated hypercarbic acidosis without evidence of significant acute hypercarbic respiratory failure.  We will order albuterol as well as remdesivir and Decadron given COVID-positive PCR noted in chart on 9/15.  Dimer is elevated 1.73.  This will also order a CTA to rule out a PE.  Given patient is confused and altered I will also order CT head to rule out intracranial hemorrhage or other acute intracranial process could be contributing.  CT head shows no  acute intracranial process including bleeding, subacute CVA or increased pressure or mass-effect.  CTA chest shows no evidence of PE but does show left lower lobe consolidation concerning for bronchopneumonia versus aspiration pneumonitis.  There is also expansion of previous left upper lobe nodule and some stable mediastinal adenopathy.  No other acute thoracic process noted.  Given fever, tachycardia and leukocytosis and concern for sepsis as well for possible bacterial pneumonia in addition to  [place].  Will treat with Rocephin as a throw.  Blood cultures have already been obtained.  Patient appears euvolemic and is not elevated lactic and so we will gently hydrate at this time.  I will admit to medicine service for further evaluation and management.  ____________________________________________   FINAL CLINICAL IMPRESSION(S) / ED DIAGNOSES  Final diagnoses:  COVID  Acute respiratory failure with hypoxia (HCC)  Positive D dimer  Altered mental status, unspecified altered mental status type  Troponin I above reference range  Chronic kidney disease, unspecified CKD stage  SIRS (systemic inflammatory response syndrome) (HCC)  Sepsis, due to unspecified organism, unspecified whether acute organ dysfunction present [facility])  Community acquired pneumonia of left lung, unspecified part of lung    Medications  acetaminophen (TYLENOL) tablet 1,000 mg (has no administration in time range)  albuterol (VENTOLIN HFA) 108 (90 Base) MCG/ACT inhaler 2 puff (has no administration in time range)  aspirin chewable tablet 324 mg (has no administration in time range)  remdesivir 200 mg in sodium chloride 0.9% 250 mL IVPB (has no administration in time range)    Followed by  remdesivir 100 mg in sodium chloride 0.9 % 100 mL IVPB (has no administration in time range)  cefTRIAXone (ROCEPHIN) 1 g in sodium chloride 0.9 % 100 mL IVPB (has no administration in time range)  azithromycin (ZITHROMAX) 500 mg in sodium chloride 0.9 % 250 mL IVPB (has no administration in time range)  lactated ringers bolus 1,000 mL (has no administration in time range)  lactated ringers bolus 500 mL (500 mLs Intravenous New Bag/Given 07/29/21 1218)  dexamethasone (DECADRON) injection 6 mg (6 mg Intravenous Given 07/29/21 1230)  iohexol (OMNIPAQUE) 350 MG/ML injection 60 mL (60 mLs Intravenous Contrast Given 07/29/21 1319)     ED Discharge Orders     None        Note:  This document was prepared using Dragon voice  recognition software and may include unintentional dictation errors.    Lucrezia Starch, MD 07/29/21 [phone]

## 2021-07-30 DIAGNOSIS — G9341 Metabolic encephalopathy: Secondary | ICD-10-CM | POA: Insufficient documentation

## 2021-07-30 DIAGNOSIS — N1832 Chronic kidney disease, stage 3b: Secondary | ICD-10-CM | POA: Insufficient documentation

## 2021-07-30 DIAGNOSIS — R918 Other nonspecific abnormal finding of lung field: Secondary | ICD-10-CM

## 2021-07-30 DIAGNOSIS — R6520 Severe sepsis without septic shock: Secondary | ICD-10-CM

## 2021-07-30 DIAGNOSIS — A419 Sepsis, unspecified organism: Secondary | ICD-10-CM

## 2021-07-30 DIAGNOSIS — G9340 Encephalopathy, unspecified: Secondary | ICD-10-CM

## 2021-07-30 DIAGNOSIS — J189 Pneumonia, unspecified organism: Secondary | ICD-10-CM

## 2021-07-30 DIAGNOSIS — R531 Weakness: Secondary | ICD-10-CM | POA: Insufficient documentation

## 2021-07-30 LAB — TROPONIN I (HIGH SENSITIVITY): Troponin I (High Sensitivity): 19 ng/L — ABNORMAL HIGH (ref <18)

## 2021-07-30 LAB — CBC
HCT: 31.2 % — ABNORMAL LOW (ref 39.0–52.0)
Hemoglobin: 10.1 g/dL — ABNORMAL LOW (ref 13.0–17.0)
MCH: 29.3 pg (ref 26.0–34.0)
MCHC: 32.4 g/dL (ref 30.0–36.0)
MCV: 90.4 fL (ref 80.0–100.0)
Platelets: 168 10*3/uL (ref 150–400)
RBC: 3.45 MIL/uL — ABNORMAL LOW (ref 4.22–5.81)
RDW: 14.3 % (ref 11.5–15.5)
WBC: 13.1 10*3/uL — ABNORMAL HIGH (ref 4.0–10.5)
nRBC: 0 % (ref 0.0–0.2)

## 2021-07-30 LAB — BASIC METABOLIC PANEL
Anion gap: 10 (ref 5–15)
BUN: 37 mg/dL — ABNORMAL HIGH (ref 8–23)
CO2: 23 mmol/L (ref 22–32)
Calcium: 9 mg/dL (ref 8.9–10.3)
Chloride: 102 mmol/L (ref 98–111)
Creatinine, Ser: 1.68 mg/dL — ABNORMAL HIGH (ref 0.61–1.24)
GFR, Estimated: 39 mL/min — ABNORMAL LOW (ref >60)
Glucose, Bld: 148 mg/dL — ABNORMAL HIGH (ref 70–99)
Potassium: 4.7 mmol/L (ref 3.5–5.1)
Sodium: 135 mmol/L (ref 135–145)

## 2021-07-30 LAB — URINE CULTURE: Culture: <10000 — AB

## 2021-07-30 LAB — C-REACTIVE PROTEIN: CRP: 19.9 mg/dL — ABNORMAL HIGH (ref <1.0)

## 2021-07-30 LAB — MAGNESIUM: Magnesium: 2.3 mg/dL (ref 1.7–2.4)

## 2021-07-30 LAB — LACTIC ACID, PLASMA: Lactic Acid, Venous: 1.9 mmol/L (ref 0.5–1.9)

## 2021-07-30 MED ORDER — FLUTICASONE-UMECLIDIN-VILANT 100-62.5-25 MCG/INH IN AEPB: 1.0000 | INHALATION_SPRAY | Freq: Every day | RESPIRATORY_TRACT | Status: DC

## 2021-07-30 MED ORDER — UMECLIDINIUM BROMIDE 62.5 MCG/INH IN AEPB
1.0000 | INHALATION_SPRAY | Freq: Every day | RESPIRATORY_TRACT | Status: DC
  Administered 2021-07-30 – 2021-08-01 (×3): 1 via RESPIRATORY_TRACT
  Filled 2021-07-30: qty 7

## 2021-07-30 MED ORDER — FLUTICASONE FUROATE-VILANTEROL 100-25 MCG/INH IN AEPB
1.0000 | INHALATION_SPRAY | Freq: Every day | RESPIRATORY_TRACT | Status: DC
  Administered 2021-07-30 – 2021-08-01 (×3): 1 via RESPIRATORY_TRACT
  Filled 2021-07-30: qty 28

## 2021-07-30 NOTE — ED Notes (Signed)
Informed RN bed assigned 

## 2021-07-30 NOTE — ED Notes (Signed)
Pt rocephin completed. Pt denies any pain at this time. Requesting ice cream.

## 2021-07-30 NOTE — Progress Notes (Signed)
Patient ID: Tiandre Teall, male   DOB: 1933/04/13, 87 y.o.   MRN: [id number] [facility] PROGRESS NOTE  Jagjit Riner Merrihew ESP:[id number] DOB: 12-Sep-1933 DOA: 07/29/2021 PCP: McLean-Scocuzza, Nino Glow, MD  HPI/Subjective: Patient was brought in with altered mental status and weakness.  Was diagnosed with COVID and was started on lagevrio as outpatient.  Patient believes his mental status is better.  Interested in getting up and walking.   Objective: Vitals:   07/30/21 1000 07/30/21 1100  BP: (!) 126/95 (!) 149/58  Pulse: 66 66  Resp: 16 14  Temp:    SpO2: 100% 97%    Intake/Output Summary (Last 24 hours) at 07/30/2021 1225 Last data filed at 07/30/2021 0528 Gross per 24 hour  Intake --  Output 200 ml  Net -200 ml   Filed Weights   07/29/21 0953  Weight: 68 kg    ROS: Review of Systems  Respiratory:  Positive for cough. Negative for shortness of breath.   Cardiovascular:  Negative for chest pain.  Gastrointestinal:  Negative for abdominal pain, nausea and vomiting.  Exam: Physical Exam HENT:     Head: Normocephalic.     Mouth/Throat:     Pharynx: No oropharyngeal exudate.  Eyes:     General: Lids are normal.     Conjunctiva/sclera: Conjunctivae normal.  Cardiovascular:     Rate and Rhythm: Normal rate and regular rhythm.     Heart sounds: Normal heart sounds, S1 normal and S2 normal.  Pulmonary:     Breath sounds: Examination of the right-lower field reveals decreased breath sounds. Examination of the left-lower field reveals decreased breath sounds. Decreased breath sounds present. No wheezing, rhonchi or rales.  Abdominal:     Palpations: Abdomen is soft.     Tenderness: There is no abdominal tenderness.  Musculoskeletal:     Right lower leg: No swelling.     Left lower leg: No swelling.  Skin:    General: Skin is warm.     Findings: No rash.  Neurological:     Mental Status: He is alert.     Comments: Answers all questions appropriately.  Able  to straight leg raise.      Scheduled Meds:  apixaban  2.5 mg Oral BID   dexamethasone (DECADRON) injection  6 mg Intravenous Daily   doxycycline  100 mg Oral Q12H   DULoxetine  30 mg Oral BID   finasteride  5 mg Oral Daily   fluticasone furoate-vilanterol  1 puff Inhalation Daily   And   umeclidinium bromide  1 puff Inhalation Daily   metoprolol tartrate  50 mg Oral BID   sodium chloride flush  3 mL Intravenous Q12H   Continuous Infusions:  cefTRIAXone (ROCEPHIN)  IV     remdesivir 100 mg in NS 100 mL Stopped (07/30/21 1000)    Assessment/Plan:  Clinical sepsis, present on admission with fever of 103.2, tachycardia, leukocytosis.  Acute metabolic encephalopathy.  Patient has pneumonia and COVID-19 infection.  Started on remdesivir, Rocephin and doxycycline.  Also started on Decadron.  Follow-up cultures. Acute metabolic encephalopathy this has improved.  We will get physical therapy evaluation Chronic kidney disease stage IIIb COPD continue inhalers History of lung mass received radiation as outpatient.  [facility]. Weakness.  Physical therapy evaluation PTSD on Cymbalta BPH on finasteride Patient on low-dose Eliquis.        Code Status:     Code  Status Orders  (From admission, onward)           Start     Ordered   07/29/21 1542  Full code  Continuous        07/29/21 1551           Code Status History     Date Active Date Inactive Code Status Order ID Comments User Context   01/03/2018 1949 01/04/2018 1705 Full Code [id number]  Henreitta Leber, MD Inpatient      Family Communication: Spoke with patient's daughter on the phone Disposition Plan: Status is: Inpatient  Dispo: The patient is from: Home              Anticipated d/c is to: Home              Patient currently being treated for clinical sepsis COVID-19 pneumonia and bacterial pneumonia on IV remdesivir and IV antibiotics.    Difficult to place patient.  No.  Antibiotics: Remdesivir, Rocephin and doxycycline  Time spent: 29 minutes  Lakeva Hollon [employer]

## 2021-07-30 NOTE — ED Notes (Addendum)
Pt given crackers, ice cream, applesauce and coffee. Pt will call when done to be assisted to [facility] for BM. Lactic acid sent to lab

## 2021-07-30 NOTE — Evaluation (Signed)
Physical Therapy Evaluation Patient Details Name: Donald [name] MRN: [id number] DOB: 28-Feb-1933 Today's Date: 07/30/2021  History of Present Illness  Pt is a 87 y.o. M w/ PMH of COPD, lung cancer (no longer receiving chemo),HTN, HDL, TIA, anemia, arthritis, BPH and paroxysmal A. fib, early dementia w/ mild forgetfulness arriving to ED for AMS after recent dx of COVID.  Clinical Impression  Pt alert with daughter present throughout treatment. Pt is oriented x 4, pleasant and cooperative throughout treatment. Pt states PLOF is mod-I w/ RW for intermittent household mobility and community distances. Pt works in the yard and performs all ADLs/IADL independently. Pt's family lives close by and is able to provide assistance if needed.   Pt initially on room air, destated to 33s with in-room ambulation, RW, MIN-GUARD. Placed on 1.5L O2 in semi-fowler position with O2 stats > 90%. Performed standing marching, side, and fwd stepping, RW, MIN-GUARD for safety on 1.5L w/ SpO2 > 90%. Pt is MOD-I for bed mobility w/ usage of bed rails and MIN-GUARD for sit <>stand, RW due to sx of lightheadedness. Vitals assessed: (supine) BP 143/60s (seated) 137/94. Symptoms decreased prior to standing. Pt appears to be at baseline or close to baseline functionally, therefore HHPT is recommended to assist with overall functional mobility, increased endurance, improve strength, and decrease fall risks. Skilled PT intervention is indicated to address deficits in function, mobility, and to return to PLOF as able.       Recommendations for follow up therapy are one component of a multi-disciplinary discharge planning process, led by the attending physician.  Recommendations may be updated based on patient status, additional functional criteria and insurance authorization.  Follow Up Recommendations Home health PT    Equipment Recommendations  None recommended by PT    Recommendations for Other Services        Precautions / Restrictions Precautions Precautions: Fall Restrictions Weight Bearing Restrictions: No      Mobility  Bed Mobility Overal bed mobility: Modified Independent             General bed mobility comments: MOD I for increased time coming to EOB, able to scoot in bed w/o physical assist    Transfers Overall transfer level: Needs assistance Equipment used: Rolling walker (2 wheeled) Transfers: Sit to/from Stand Sit to Stand: Min guard         General transfer comment: MIN-GAURD for safety, pt notes lightheadedness  Ambulation/Gait Ambulation/Gait assistance: Min assist Gait Distance (Feet): 10 Feet Assistive device: Rolling walker (2 wheeled) Gait Pattern/deviations: Trunk flexed Gait velocity: Decreased   General Gait Details: Pt destated to 70s on room air, returned to bed on 1.5L O2 [place]  Stairs            Wheelchair Mobility    Modified Rankin (Stroke Patients Only)       Balance Overall balance assessment: History of Falls;Needs assistance Sitting-balance support: Feet unsupported;Bilateral upper extremity supported Sitting balance-Leahy Scale: Good     Standing balance support: Bilateral upper extremity supported;During functional activity Standing balance-Leahy Scale: Fair Standing balance comment: required BUE support                             Pertinent Vitals/Pain Pain Assessment: No/denies pain    Home Living Family/patient expects to be discharged to:: Private residence Living Arrangements: Spouse/significant other Available Help at Discharge: Family;Available 24 hours/day Type of Home: House Home Access: Ramped entrance     Home Layout:  One level Home Equipment: [place] - 2 wheels;Walker - 4 wheels;Walker - standard;Cane - single point;Bedside commode;Shower seat;Grab bars - toilet      Prior Function Level of Independence: Independent with assistive device(s)         Comments: ambulates w/ RW for  community distances, and intermittently for household distances. Pt and pt's daughter indicates fall hx over last 6 months.     Hand Dominance        Extremity/Trunk Assessment   Upper Extremity Assessment Upper Extremity Assessment: Overall WFL for tasks assessed    Lower Extremity Assessment Lower Extremity Assessment: Overall WFL for tasks assessed       Communication   Communication: No difficulties  Cognition Arousal/Alertness: Awake/alert Behavior During Therapy: WFL for tasks assessed/performed Overall Cognitive Status: Within Functional Limits for tasks assessed                                 General Comments: AOx4, pleasant, cooperative and follows one-step commands      General Comments      Exercises Other Exercises Other Exercises: Standing marching, side stepping x 10/each on 1.5 L O2 SpO2 > 90%, MIN-GAURD, RW for safety   Assessment/Plan    PT Assessment Patient needs continued PT services  PT Problem List Decreased strength;Decreased range of motion;Decreased activity tolerance;Decreased balance;Decreased mobility       PT Treatment Interventions Balance training;Gait training;Neuromuscular re-education;Stair training;Functional mobility training;Therapeutic activities;Therapeutic exercise    PT Goals (Current goals can be found in the Care Plan section)  Acute Rehab PT Goals Patient Stated Goal: To go home PT Goal Formulation: With patient Time For Goal Achievement: 08/13/21 Potential to Achieve Goals: Good    Frequency Min 2X/week   Barriers to discharge        Co-evaluation               AM-PAC PT "6 Clicks" Mobility  Outcome Measure Help needed turning from your back to your side while in a flat bed without using bedrails?: None Help needed moving from lying on your back to sitting on the side of a flat bed without using bedrails?: A Little Help needed moving to and from a bed to a chair (including a wheelchair)?:  A Little Help needed standing up from a chair using your arms (e.g., wheelchair or bedside chair)?: A Little Help needed to walk in hospital room?: A Little Help needed climbing 3-5 steps with a railing? : A Little 6 Click Score: 19    End of Session Equipment Utilized During Treatment: Gait belt Activity Tolerance: Patient tolerated treatment well Patient left: in bed;with bed alarm set;with family/visitor present;with call bell/phone within reach Nurse Communication: Mobility status PT Visit Diagnosis: Other abnormalities of gait and mobility (R26.89);Muscle weakness (generalized) (M62.81);History of falling (Z91.81)    Time: 1400-1443 PT Time Calculation (min) (ACUTE ONLY): 43 min   Charges:            [employer], SPT

## 2021-07-31 ENCOUNTER — Ambulatory Visit: Payer: Medicare HMO

## 2021-07-31 ENCOUNTER — Telehealth: Payer: Self-pay

## 2021-07-31 DIAGNOSIS — N179 Acute kidney failure, unspecified: Secondary | ICD-10-CM

## 2021-07-31 DIAGNOSIS — N1831 Chronic kidney disease, stage 3a: Secondary | ICD-10-CM

## 2021-07-31 DIAGNOSIS — J9601 Acute respiratory failure with hypoxia: Secondary | ICD-10-CM

## 2021-07-31 DIAGNOSIS — N189 Chronic kidney disease, unspecified: Secondary | ICD-10-CM

## 2021-07-31 LAB — CBC
HCT: 32.1 % — ABNORMAL LOW (ref 39.0–52.0)
Hemoglobin: 10.3 g/dL — ABNORMAL LOW (ref 13.0–17.0)
MCH: 28.1 pg (ref 26.0–34.0)
MCHC: 32.1 g/dL (ref 30.0–36.0)
MCV: 87.5 fL (ref 80.0–100.0)
Platelets: 201 10*3/uL (ref 150–400)
RBC: 3.67 MIL/uL — ABNORMAL LOW (ref 4.22–5.81)
RDW: 14.2 % (ref 11.5–15.5)
WBC: 15.8 10*3/uL — ABNORMAL HIGH (ref 4.0–10.5)
nRBC: 0 % (ref 0.0–0.2)

## 2021-07-31 LAB — BASIC METABOLIC PANEL
Anion gap: 5 (ref 5–15)
BUN: 42 mg/dL — ABNORMAL HIGH (ref 8–23)
CO2: 25 mmol/L (ref 22–32)
Calcium: 8.9 mg/dL (ref 8.9–10.3)
Chloride: 107 mmol/L (ref 98–111)
Creatinine, Ser: 1.35 mg/dL — ABNORMAL HIGH (ref 0.61–1.24)
GFR, Estimated: 51 mL/min — ABNORMAL LOW (ref >60)
Glucose, Bld: 116 mg/dL — ABNORMAL HIGH (ref 70–99)
Potassium: 4.6 mmol/L (ref 3.5–5.1)
Sodium: 137 mmol/L (ref 135–145)

## 2021-07-31 LAB — C-REACTIVE PROTEIN: CRP: 13.2 mg/dL — ABNORMAL HIGH (ref <1.0)

## 2021-07-31 MED ORDER — TOCILIZUMAB 400 MG/20ML IV SOLN
550.0000 mg | Freq: Once | INTRAVENOUS | Status: AC
  Administered 2021-07-31: 550 mg via INTRAVENOUS
  Filled 2021-07-31: qty 10

## 2021-07-31 NOTE — Telephone Encounter (Signed)
Unable to reach patient for scheduled AWV on preferred number. No answer. Unable to leave voice mail. Recording states having trouble with voice mail. Reschedule.

## 2021-07-31 NOTE — Progress Notes (Addendum)
Patient ID: Donald [name], male   DOB: 28-Nov-1932, 87 y.o.   MRN: [id number] [facility] PROGRESS NOTE  Masaji Billups Derrick DJS:[id number] DOB: 1932-12-24 DOA: 07/29/2021 PCP: McLean-Scocuzza, Nino Glow, MD  HPI/Subjective: Patient did not remember why he came into the hospital.  Had altered mental status.  Found to be COVID-positive and pneumonia.  Patient with little cough.  Slight shortness of breath  Objective: Vitals:   07/31/21 1204 07/31/21 1618  BP: (!) 168/77 (!) 162/68  Pulse: 66 92  Resp: 18 18  Temp: 97.7 F (36.5 C) 97.7 F (36.5 C)  SpO2: 95% 96%    Intake/Output Summary (Last 24 hours) at 07/31/2021 1726 Last data filed at 07/31/2021 1505 Gross per 24 hour  Intake 286.06 ml  Output 200 ml  Net 86.06 ml   Filed Weights   07/29/21 0953  Weight: 68 kg    ROS: Review of Systems  Respiratory:  Positive for cough and shortness of breath.   Cardiovascular:  Negative for chest pain.  Gastrointestinal:  Negative for abdominal pain, nausea and vomiting.  Exam: Physical Exam HENT:     Head: Normocephalic.     Mouth/Throat:     Pharynx: No oropharyngeal exudate.  Eyes:     General: Lids are normal.     Conjunctiva/sclera: Conjunctivae normal.  Cardiovascular:     Rate and Rhythm: Normal rate and regular rhythm.     Heart sounds: Normal heart sounds, S1 normal and S2 normal.  Pulmonary:     Breath sounds: Examination of the right-lower field reveals decreased breath sounds and rhonchi. Examination of the left-lower field reveals decreased breath sounds and rhonchi. Decreased breath sounds and rhonchi present. No wheezing or rales.  Abdominal:     Palpations: Abdomen is soft.     Tenderness: There is no abdominal tenderness.  Musculoskeletal:     Right lower leg: Swelling present.     Left lower leg: Swelling present.  Skin:    General: Skin is warm.     Findings: No rash.  Neurological:     Mental Status: He is alert.     Comments: Answers  questions appropriately.  Took him a while to adjust himself to sit up in the bed.      Scheduled Meds:  apixaban  2.5 mg Oral BID   dexamethasone (DECADRON) injection  6 mg Intravenous Daily   doxycycline  100 mg Oral Q12H   DULoxetine  30 mg Oral BID   finasteride  5 mg Oral Daily   fluticasone furoate-vilanterol  1 puff Inhalation Daily   And   umeclidinium bromide  1 puff Inhalation Daily   metoprolol tartrate  50 mg Oral BID   sodium chloride flush  3 mL Intravenous Q12H   Continuous Infusions:  cefTRIAXone (ROCEPHIN)  IV 200 mL/hr at 07/31/21 1505   remdesivir 100 mg in NS 100 mL Stopped (07/31/21 1332)   tocilizumab (ACTEMRA) - non-COVID treatment 550 mg (07/31/21 1714)   Brief history 87 year old man coming in with clinical sepsis, COVID-19 pneumonia.  Started on antibiotics remdesivir and steroids.  With high CRP 1 dose of Actemra prescribed.  CT scan of the chest negative for pulmonary embolism, positive for pneumonia and previous lung mass.  Patient also had acute metabolic encephalopathy.  Past medical history of lung cancer, COPD, GERD, hyperlipidemia, hypertension, right carotid stenosis, PTSD, TIA.  Assessment/Plan:  Clinical sepsis, present on admission with initial fever of 103.2, tachycardia and leukocytosis.  Patient also had acute metabolic  encephalopathy.  Patient has pneumonia and COVID-19 infection.  Patient was started on remdesivir (now day 3), Rocephin and doxycycline.  Also started on Decadron.  With high CRP 1 dose of Actemra.  Blood culture and urine culture negative.  Lactic acidosis of 2. Acute metabolic encephalopathy.  This has improved. Acute kidney injury on chronic kidney disease stage IIIb.  Creatinine 1.73 on presentation and down to 1.35. COPD.  Continue inhalers History of lung mass and received radiation treatment as outpatient.  Follows at the [place] for this.  Has another scan at the end of the month. Weakness.  Physical therapy recommending  rehab PTSD on Cymbalta BPH on finasteride Patient on low-dose Eliquis.    Code Status:     Code Status Orders  (From admission, onward)           Start     Ordered   07/29/21 1542  Full code  Continuous        07/29/21 1551           Code Status History     Date Active Date Inactive Code Status Order ID Comments User Context   01/03/2018 1949 01/04/2018 1705 Full Code [id number]  Henreitta Leber, MD Inpatient      Family Communication: Spoke with patient's daughter on the phone Disposition Plan: Status is: Inpatient  Dispo: The patient is from: Home              Anticipated d/c is to: Home with home health in the next day or so depending on clinical course.  Of note the patient's wife is likely going to be admitted to the hospital.  Patient's daughter working on options for when patient goes home              Patient currently being treated for COVID-19 pneumonia and sepsis   Difficult to place patient.  No.  Antibiotics: Rocephin, doxycycline and remdesivir  Time spent: 26 minutes  [place]

## 2021-07-31 NOTE — TOC Progression Note (Signed)
Transition of Care [facility]) - Progression Note    Patient Details  Name: Donald [name] MRN: [id number] Date of Birth: 01-Sep-1933  Transition of Care [facility]) CM/SW [place], RN Phone Number: 07/31/2021, 3:04 PM  Clinical Narrative:   Spoke with patient's daughter, [name] [phone].  She is going away until Saturday and patient's spouse is in the hospital with hip issues, is not home and is unable to care for him.  Daughter shares that they are both suffering from the beginning stages of dementia.  They are unable to drive.  Daughter is attempting to find someone to stay with patient, and states that wife will be hospitalized for a few days.    Daughter will call back with confirmation that there is someone to stay with patient, as they are making some calls.    Daughter is amenable to [facility] confirms they are able to accept patient, and states patient has walker and BSC at home at this time.  Patient and spouse live in a ranch home with ramps to access.  TOC contact information given, TOC will follow to discharge.      Expected Discharge Plan: [place] Barriers to Discharge: Continued Medical Work up  Expected Discharge Plan and Services Expected Discharge Plan: [place]   Discharge Planning Services: CM Consult   Living arrangements for the past 2 months: Single Family Home                                       Social Determinants of Health (SDOH) Interventions    Readmission Risk Interventions No flowsheet data found.

## 2021-08-01 ENCOUNTER — Telehealth: Payer: Self-pay | Admitting: Internal Medicine

## 2021-08-01 LAB — BASIC METABOLIC PANEL
Anion gap: 6 (ref 5–15)
BUN: 39 mg/dL — ABNORMAL HIGH (ref 8–23)
CO2: 25 mmol/L (ref 22–32)
Calcium: 9 mg/dL (ref 8.9–10.3)
Chloride: 107 mmol/L (ref 98–111)
Creatinine, Ser: 1.26 mg/dL — ABNORMAL HIGH (ref 0.61–1.24)
GFR, Estimated: 56 mL/min — ABNORMAL LOW (ref >60)
Glucose, Bld: 114 mg/dL — ABNORMAL HIGH (ref 70–99)
Potassium: 4.6 mmol/L (ref 3.5–5.1)
Sodium: 138 mmol/L (ref 135–145)

## 2021-08-01 LAB — CBC
HCT: 31.3 % — ABNORMAL LOW (ref 39.0–52.0)
Hemoglobin: 10.4 g/dL — ABNORMAL LOW (ref 13.0–17.0)
MCH: 29.2 pg (ref 26.0–34.0)
MCHC: 33.2 g/dL (ref 30.0–36.0)
MCV: 87.9 fL (ref 80.0–100.0)
Platelets: 208 10*3/uL (ref 150–400)
RBC: 3.56 MIL/uL — ABNORMAL LOW (ref 4.22–5.81)
RDW: 14.2 % (ref 11.5–15.5)
WBC: 14.4 10*3/uL — ABNORMAL HIGH (ref 4.0–10.5)
nRBC: 0 % (ref 0.0–0.2)

## 2021-08-01 LAB — C-REACTIVE PROTEIN: CRP: 6 mg/dL — ABNORMAL HIGH (ref <1.0)

## 2021-08-01 MED ORDER — DEXAMETHASONE 6 MG PO TABS: 6.0000 mg | ORAL_TABLET | Freq: Every day | ORAL | 0 refills | Status: DC

## 2021-08-01 NOTE — Discharge Summary (Addendum)
Physician Discharge Summary  Patient ID: Donald [name] MRN: [id number] DOB/AGE: 05/27/1933 87 y.o.  Admit date: 07/29/2021 Discharge date: 08/01/2021  Admission Diagnoses:  Discharge Diagnoses:  Principal Problem:   COVID-19 virus infection Active Problems:   Lung mass   Sepsis ([place])   PNA (pneumonia)   AMS (altered mental status)   Hypoxemia   Acute respiratory failure with hypoxia ([place])   Acute kidney injury superimposed on CKD [facility])   Discharged Condition: good  Hospital Course:   87 year old man coming in with clinical sepsis, COVID-19 pneumonia.  Started on antibiotics remdesivir and steroids.  With high CRP 1 dose of Actemra prescribed.  CT scan of the chest negative for pulmonary embolism, positive for pneumonia and previous lung mass.  Patient also had acute metabolic encephalopathy.  Past medical history of lung cancer, COPD, GERD, hyperlipidemia, hypertension, right carotid stenosis, PTSD, TIA. Upon arriving the hospital: Patient was given Actemra, IV steroids, he was also treated with antibiotics.  But a procalcitonin level was not elevated.  His condition had improved, at this point, he is medically stable to be discharged.  #1.  Sepsis secondary to COVID-pneumonia. COVID-pneumonia Acute metabolic encephalopathy secondary to COVID pneumonia. Patient condition had improved, currently patient does not require any oxygen.  He has completed Actemra.  At this point, he is medically stable to be discharged.  I will continue to finish 10 days course of steroids.  Patient be followed by his family doctor in the near future. I will discontinue antibiotics at this point. I also reviewed the patient chart, patient did not have significant hypoxemia, at one point, his saturation was 80%, but I appear to be a measure error.  He never required oxygen. Acute hypoxemic respiratory failure ruled out.  2.  Chronic kidney disease stage IIIb. Patient does not meet criteria for  acute kidney injury.  Renal function is better after treatment. Acute renal failure ruled out.  3.  Lung mass. CT scan appears to be larger than before, patient need to be followed up with his oncologist as outpatient.  #4.  COPD  Resume all home medicines.       Consults: None  Significant Diagnostic Studies:  CT ANGIOGRAPHY CHEST WITH CONTRAST   TECHNIQUE: Multidetector CT imaging of the chest was performed using the standard protocol during bolus administration of intravenous contrast. Multiplanar CT image reconstructions and MIPs were obtained to evaluate the vascular anatomy.   CONTRAST:  57mL OMNIPAQUE IOHEXOL 350 MG/ML SOLN   COMPARISON:  CT 06/08/2020   FINDINGS: Cardiovascular: No filling defects within the pulmonary arteries to suggest acute pulmonary embolism.   Mediastinum/Nodes: Enlarged mediastinal lymph nodes similar to prior. RIGHT paratracheal node measures 17 mm compared to 19 mm. Subcarinal node measures 25 mm compared with 25 mm.   Lungs/Pleura: Band of curvilinear consolidation in the LEFT upper lobe measuring 2.8 x 1.7 cm is at site of previous nodule on comparison CT measuring 1.2 cm. This nodule is hypermetabolic on comparison PET-CT scan.   New peribronchial segmental nodular consolidation in the LEFT lower lobe (image 54/9).   RIGHT lung relatively clear. Interval resolution of tree-in-bud pattern previously seen in the RIGHT lower lobe.   Upper Abdomen: Limited view of the liver, kidneys, pancreas are unremarkable. Normal adrenal glands.   Musculoskeletal: No aggressive osseous lesion.   Review of the MIP images confirms the above findings.   IMPRESSION: 1. No evidence acute pulmonary embolism. 2. New airspace consolidation in the LEFT lower lobe consistent with pulmonary  infection. Favor bronchopneumonia over COVID pneumonia. Aspiration pneumonitis could have a similar pattern. 3. Interval expansion of previous hypermetabolic  LEFT upper lobe nodule to band like curvilinear consolidation. Nodule was suspicious for bronchogenic carcinoma on PET-CT 07/11/2020. Query interval treatment. 4. Stable mediastinal adenopathy.     Electronically Signed   By: Suzy Bouchard M.D.   On: 07/29/2021 14:08      Treatments: Antibiotics, Actemra and steroids.  Discharge Exam: Blood pressure (!) 184/60, pulse 74, temperature 97.7 F (36.5 C), resp. rate 16, height 5\' 8"  (1.727 m), weight 68 kg, SpO2 96 %. General appearance: alert and cooperative Resp: clear to auscultation bilaterally Cardio: regular rate and rhythm, S1, S2 normal, no murmur, click, rub or gallop GI: soft, non-tender; bowel sounds normal; no masses,  no organomegaly Extremities: extremities normal, atraumatic, no cyanosis or edema  Disposition: Discharge disposition: 01-Home or Self Care       Discharge Instructions     Diet - low sodium heart healthy   Complete by: As directed    Increase activity slowly   Complete by: As directed       Allergies as of 08/01/2021       Reactions   Lovastatin    Cephalexin Rash        Medication List     STOP taking these medications    molnupiravir EUA 200 mg Caps capsule Commonly known as: LAGEVRIO   omeprazole 40 MG capsule Commonly known as: PRILOSEC       TAKE these medications    albuterol 108 (90 Base) MCG/ACT inhaler Commonly known as: VENTOLIN HFA Inhale 2 puffs into the lungs every 6 (six) hours as needed for wheezing or shortness of breath.   apixaban 2.5 MG Tabs tablet Commonly known as: ELIQUIS Take 1 tablet (2.5 mg total) by mouth 2 (two) times daily.   aspirin 325 MG tablet Take 650 mg by mouth every 6 (six) hours as needed for mild pain or fever.   cetirizine 10 MG tablet Commonly known as: ZYRTEC Take 10 mg by mouth daily.   cholecalciferol 1000 units tablet Commonly known as: VITAMIN D Take 1,000 Units by mouth daily.   dexamethasone 6 MG  tablet Commonly known as: DECADRON Take 1 tablet (6 mg total) by mouth daily.   DULoxetine 30 MG capsule Commonly known as: CYMBALTA Take 30 mg by mouth 2 (two) times daily.   famotidine 20 MG tablet Commonly known as: PEPCID Take 1 tablet (20 mg total) by mouth daily as needed for heartburn or indigestion.   finasteride 5 MG tablet Commonly known as: PROSCAR Take 5 mg by mouth daily.   hydrochlorothiazide 25 MG tablet Commonly known as: HYDRODIURIL Take 0.5 tablets (12.5 mg total) by mouth daily.   lisinopril 20 MG tablet Commonly known as: ZESTRIL Take 20 mg by mouth daily.   Mega Multivitamin for Men Tabs Take 1 tablet by mouth daily.   metoprolol tartrate 50 MG tablet Commonly known as: LOPRESSOR Take 1 tablet by mouth 2 (two) times daily.   Tiotropium Bromide-Olodaterol 2.5-2.5 MCG/ACT Aers Inhale 2 puffs into the lungs daily.   Trelegy Ellipta 100-62.5-25 MCG/INH Aepb Generic drug: Fluticasone-Umeclidin-Vilant Inhale 1 puff into the lungs daily. Rinse mouth Samples of this drug were given to the patient, quantity 1, Lot Number [id number] 02/2022        Follow-up Information     McLean-Scocuzza, Nino Glow, MD Follow up in 1 week(s).   Specialty: Internal Medicine Contact information: [address]  [place] [phone]                32 minutes Signed: Sharen Hones 08/01/2021, 10:13 AM

## 2021-08-01 NOTE — TOC Progression Note (Signed)
Transition of Care [facility]) - Progression Note    Patient Details  Name: Donald [name] MRN: [id number] Date of Birth: 1932-11-30  Transition of Care [facility]) CM/SW [place], RN Phone Number: 08/01/2021, 10:48 AM  Clinical Narrative:   Anticipated patient discharge today with [place] [facility].  Daughter contacted RNCM, states granddaughters will be staying with patient until she returns from her out of town trip on Saturday.    Expected Discharge Plan: [place] Barriers to Discharge: Continued Medical Work up  Expected Discharge Plan and Services Expected Discharge Plan: [place]   Discharge Planning Services: CM Consult   Living arrangements for the past 2 months: Single Family Home Expected Discharge Date: 08/01/21                                     Social Determinants of Health (SDOH) Interventions    Readmission Risk Interventions No flowsheet data found.

## 2021-08-01 NOTE — Telephone Encounter (Signed)
Patient is being released from the hospital today for [place]. Patient needs a hospital  follow up in one week.

## 2021-08-01 NOTE — Progress Notes (Signed)
Physical Therapy Treatment Patient Details Name: Donald [name] MRN: [id number] DOB: Sep 25, 1933 Today's Date: 08/01/2021   History of Present Illness Pt is a 87 y.o. M w/ PMH of COPD, lung cancer (no longer receiving chemo),HTN, HDL, TIA, anemia, arthritis, BPH and paroxysmal A. fib, early dementia w/ mild forgetfulness arriving to ED for AMS after recent dx of COVID.    PT Comments    Pt A&Ox4 (knew it was September 20 something), did name the wrong [facility] hospital. Denied pain. The patient was able to perform supine exercises with tactile and verbal cues. Supine to sit modI with use of bed rails, and sat EOB for several minutes and brushed teeth with assistance with set up. Reported initial dizziness that resolved over time. Sit <> stand with RW and supervision, cued for hand placement. He ambulated ~5ft total with RW and CGA/supervision. No LOB noted. Pt up in chair, all needs in reach. The patient would benefit from further skilled PT intervention to continue to progress towards goals. Recommendation remains appropriate.    Recommendations for follow up therapy are one component of a multi-disciplinary discharge planning process, led by the attending physician.  Recommendations may be updated based on patient status, additional functional criteria and insurance authorization.  Follow Up Recommendations  Home health PT     Equipment Recommendations  None recommended by PT    Recommendations for Other Services       Precautions / Restrictions Precautions Precautions: Fall Restrictions Weight Bearing Restrictions: No     Mobility  Bed Mobility Overal bed mobility: Modified Independent             General bed mobility comments: use of bed rails needed    Transfers Overall transfer level: Needs assistance Equipment used: Rolling walker (2 wheeled) Transfers: Sit to/from Stand Sit to Stand: Supervision         General transfer comment: cued for hand  placement  Ambulation/Gait Ambulation/Gait assistance: Min guard;Supervision Gait Distance (Feet): 30 Feet Assistive device: Rolling walker (2 wheeled)   Gait velocity: Decreased   General Gait Details: distance limited due to room ambulation. no unsteadiness noted   Stairs             Wheelchair Mobility    Modified Rankin (Stroke Patients Only)       Balance Overall balance assessment: History of Falls;Needs assistance Sitting-balance support: Feet unsupported;Bilateral upper extremity supported Sitting balance-Leahy Scale: Good Sitting balance - Comments: able to sit and brush teeth, reach outside base of support   Standing balance support: Bilateral upper extremity supported;During functional activity Standing balance-Leahy Scale: Fair Standing balance comment: improved safety noted with BUE support                            Cognition Arousal/Alertness: Awake/alert Behavior During Therapy: WFL for tasks assessed/performed Overall Cognitive Status: Within Functional Limits for tasks assessed                                 General Comments: AOx4, pleasant, cooperative and follows one-step commands      Exercises General Exercises - Lower Extremity Ankle Circles/Pumps: AROM;Both;10 reps Heel Slides: AROM;Strengthening;10 reps;Both Hip ABduction/ADduction: AROM;Strengthening;Both;10 reps    General Comments        Pertinent Vitals/Pain      Home Living  Prior Function            PT Goals (current goals can now be found in the care plan section) Progress towards PT goals: Progressing toward goals    Frequency    Min 2X/week      PT Plan Current plan remains appropriate    Co-evaluation              AM-PAC PT "6 Clicks" Mobility   Outcome Measure  Help needed turning from your back to your side while in a flat bed without using bedrails?: None Help needed moving from lying  on your back to sitting on the side of a flat bed without using bedrails?: None Help needed moving to and from a bed to a chair (including a wheelchair)?: None Help needed standing up from a chair using your arms (e.g., wheelchair or bedside chair)?: None Help needed to walk in hospital room?: None Help needed climbing 3-5 steps with a railing? : A Little 6 Click Score: 23    End of Session Equipment Utilized During Treatment: Gait belt Activity Tolerance: Patient tolerated treatment well Patient left: with family/visitor present;with call bell/phone within reach;in chair;with chair alarm set Nurse Communication: Mobility status PT Visit Diagnosis: Other abnormalities of gait and mobility (R26.89);Muscle weakness (generalized) (M62.81);History of falling (Z91.81)     Time: 4166-0630 PT Time Calculation (min) (ACUTE ONLY): 29 min  Charges:  $Therapeutic Exercise: 23-37 mins                     Lieutenant Diego PT, DPT 10:11 AM,08/01/21

## 2021-08-01 NOTE — Care Management Important Message (Signed)
Important Message  Patient Details  Name: Donald [name] MRN: [id number] Date of Birth: 01/10/1933   Medicare Important Message Given:  Yes  I talked with daughter, Donald [name] [phone] and she is very familiar with form and patient rights to appeal. She is in agreement with the discharge plan and no copy needed.  I thanked her for her time.  [name] 08/01/2021, 11:13 AM

## 2021-08-03 ENCOUNTER — Telehealth: Payer: Self-pay

## 2021-08-03 LAB — CULTURE, BLOOD (SINGLE)
Culture: NO GROWTH
Special Requests: ADEQUATE

## 2021-08-03 NOTE — Telephone Encounter (Signed)
Transition Care Management Follow-up Telephone Call Date of discharge and from where: 08/01/21 [facility] How have you been since you were released from the hospital? Denies fever, nausea, vomiting, fatigue, shortness of breath, pain and all other symptoms associated with Covid. Notes he has ben a smoker for 34 years and has the smoker's cough only. Patient notes he is at his baseline.  Any questions or concerns? No  Items Reviewed: Did the pt receive and understand the discharge instructions provided? Yes  Medications obtained and verified?  Patient notes he was not at home with access to verify medication. Agrees to call the office back and confirm medications as needed.   Any new allergies since your discharge? No Dietary orders reviewed? Yes Do you have support at home? Yes   Home Care and Equipment/Supplies: Were home health services ordered? No  Functional Questionnaire: (I = Independent and D = Dependent) ADLs: I  Bathing/Dressing- I  Meal Prep- I  Eating- I  Maintaining continence- I  Transferring/Ambulation- Walker  Managing Meds- I  Follow up appointments reviewed:  PCP Hospital f/u appt confirmed?  Scheduled to see Dr. Derrel Nip 08/24/21 at 11:00.  This is the first available appointment on all physicians schedule. Note sent to admin to call patient and schedule sooner with availability.  Are transportation arrangements needed? No  If their condition worsens, is the pt aware to call PCP or go to the Emergency Dept.? Yes Was the patient provided with contact information for the PCP's office or ED? Yes Was to pt encouraged to call back with questions or concerns? Yes

## 2021-08-03 NOTE — Telephone Encounter (Signed)
Patient scheduled with Dr. Derrel Nip 08/24/21. No other availability in office. Please reschedule appointment with patient if sooner opening is/becomes available.

## 2021-08-07 ENCOUNTER — Telehealth: Payer: Self-pay | Admitting: Internal Medicine

## 2021-08-07 NOTE — Telephone Encounter (Signed)
Patient wife calling and states Patient's blood pressure is 112/57 this morning. States Patient is feeling clammy but no pain, having dizziness, no light-headedness. Was 126/56 while on the phone.   Onset of low blood pressure and dizziness today. States he took his BP medication at 8:00 am today.  Patient took theses medications this morning:   Lisinopril 10 mg Metoprolol 50 mg  Apixaban 2.5 mg  DULoxetine (CYMBALTA) 30 MG   Please advise

## 2021-08-07 NOTE — Telephone Encounter (Signed)
Thank you.  Glad he is being evaluated.

## 2021-08-07 NOTE — Telephone Encounter (Signed)
Recently admitted with covid.  If feeling clammy and blood pressure low, is going to have to be evaluated.

## 2021-08-07 NOTE — Telephone Encounter (Signed)
Patient advised he is on a [place] care team and that a NP from the [place] is with him now at his home.

## 2021-08-29 ENCOUNTER — Inpatient Hospital Stay: Payer: Medicare Other | Admitting: Internal Medicine

## 2021-10-09 ENCOUNTER — Telehealth: Payer: Medicare Other | Admitting: Internal Medicine

## 2021-10-10 ENCOUNTER — Ambulatory Visit (INDEPENDENT_AMBULATORY_CARE_PROVIDER_SITE_OTHER): Payer: Medicare Other | Admitting: Internal Medicine

## 2021-10-10 ENCOUNTER — Encounter: Payer: Self-pay | Admitting: Internal Medicine

## 2021-10-10 ENCOUNTER — Other Ambulatory Visit: Payer: Self-pay

## 2021-10-10 VITALS — Ht 68.0 in | Wt 211.0 lb

## 2021-10-10 DIAGNOSIS — R509 Fever, unspecified: Secondary | ICD-10-CM

## 2021-10-10 DIAGNOSIS — Z1283 Encounter for screening for malignant neoplasm of skin: Secondary | ICD-10-CM

## 2021-10-10 DIAGNOSIS — R0600 Dyspnea, unspecified: Secondary | ICD-10-CM

## 2021-10-10 DIAGNOSIS — R5383 Other fatigue: Secondary | ICD-10-CM

## 2021-10-10 DIAGNOSIS — J189 Pneumonia, unspecified organism: Secondary | ICD-10-CM | POA: Diagnosis not present

## 2021-10-10 DIAGNOSIS — C3492 Malignant neoplasm of unspecified part of left bronchus or lung: Secondary | ICD-10-CM

## 2021-10-10 MED ORDER — PREDNISONE 20 MG PO TABS
40.0000 mg | ORAL_TABLET | Freq: Every day | ORAL | 0 refills | Status: DC
Start: 2021-10-10 — End: 2022-04-25

## 2021-10-10 MED ORDER — LEVOFLOXACIN 750 MG PO TABS: 750.0000 mg | ORAL_TABLET | Freq: Every day | ORAL | 0 refills | Status: DC

## 2021-10-10 NOTE — Progress Notes (Signed)
Telephone Note  I connected with Donald [name]  on 10/10/21 at 12:20 PM EST by telephone and verified that I am speaking with the correct person using two identifiers.  Location patient: home, [place] Location provider:work or home office Persons participating in the virtual visit: patient, provider  I discussed the limitations of evaluation and management by telemedicine and the availability of in person appointments. The patient expressed understanding and agreed to proceed.   HPI:  Acute telemedicine visit for : X 1 week dry cough, chest congestion h/o PET scan + left bronchogenic ca getting tx with [facility] and h/o bronchopneumonia he is having fatigue, fever to 101/2 and lack of energy and feeling sob  No testing for covid/flu yet Wife is having h/a at night x 1 week    ROS: See pertinent positives and negatives per HPI.  Past Medical History:  Diagnosis Date   Anemia    Arthritis    BPH (benign prostatic hyperplasia)    Cancer (HCC)    Lung   COPD (chronic obstructive pulmonary disease) (HCC)    Emphysema of lung (HCC)    Emphysema of lung (HCC)    Esophageal reflux    Hyperlipidemia    Hypertension    Internal carotid artery stenosis, right    Neuropathy of both feet    Pneumonia    recurrent   PTSD (post-traumatic stress disorder)    [place] vet   Sebaceous cyst    SOB (shortness of breath) on exertion    TIA (transient ischemic attack)    Urge incontinence     Past Surgical History:  Procedure Laterality Date   CYST EXCISION     buttocks   ELECTROMAGNETIC NAVIGATION BROCHOSCOPY Left 08/23/2019   Procedure: ELECTROMAGNETIC NAVIGATION BRONCHOSCOPY;  Surgeon: Tyler Pita, MD;  Location: [facility] ORS;  Service: Cardiopulmonary;  Laterality: Left;   ENDOBRONCHIAL ULTRASOUND N/A 08/23/2019   Procedure: ENDOBRONCHIAL ULTRASOUND;  Surgeon: Tyler Pita, MD;  Location: [facility] ORS;  Service: Cardiopulmonary;  Laterality: N/A;   HEMORRHOID SURGERY     shave  biopsy  03/13/2020   left neck 03/13/20 [facility] wart with atypica inflamed +AK no carcinoma    SINUSOTOMY     VIDEO BRONCHOSCOPY WITH ENDOBRONCHIAL NAVIGATION Left 06/26/2020   Procedure: VIDEO BRONCHOSCOPY WITH ENDOBRONCHIAL NAVIGATION;  Surgeon: Tyler Pita, MD;  Location: [facility] ORS;  Service: Pulmonary;  Laterality: Left;     Current Outpatient Medications:    albuterol (VENTOLIN HFA) 108 (90 Base) MCG/ACT inhaler, Inhale 2 puffs into the lungs every 6 (six) hours as needed for wheezing or shortness of breath., Disp: 18 g, Rfl: 6   apixaban (ELIQUIS) 2.5 MG TABS tablet, Take 1 tablet (2.5 mg total) by mouth 2 (two) times daily., Disp: , Rfl:    cholecalciferol (VITAMIN D) 1000 UNITS tablet, Take 1,000 Units by mouth daily., Disp: , Rfl:    hydrochlorothiazide (HYDRODIURIL) 25 MG tablet, Take 0.5 tablets (12.5 mg total) by mouth daily., Disp: , Rfl:    levofloxacin (LEVAQUIN) 750 MG tablet, Take 1 tablet (750 mg total) by mouth daily. X5-7 days with food, Disp: 7 tablet, Rfl: 0   lisinopril (ZESTRIL) 20 MG tablet, Take 20 mg by mouth daily., Disp: , Rfl:    metoprolol tartrate (LOPRESSOR) 50 MG tablet, Take 1 tablet by mouth 2 (two) times daily., Disp: , Rfl:    Multiple Vitamins-Minerals (MEGA MULTIVITAMIN FOR MEN) TABS, Take 1 tablet by mouth daily., Disp: , Rfl:  predniSONE (DELTASONE) 20 MG tablet, Take 2 tablets (40 mg total) by mouth daily with breakfast. X 1 week, Disp: 14 tablet, Rfl: 0   Tiotropium Bromide-Olodaterol 2.5-2.5 MCG/ACT AERS, Inhale 2 puffs into the lungs daily., Disp:  , Rfl:    aspirin 325 MG tablet, Take 650 mg by mouth every 6 (six) hours as needed for mild pain or fever. (Patient not taking: Reported on 10/10/2021), Disp: , Rfl:    cetirizine (ZYRTEC) 10 MG tablet, Take 10 mg by mouth daily. (Patient not taking: Reported on 10/10/2021), Disp: , Rfl:    dexamethasone (DECADRON) 6 MG tablet, Take 1 tablet (6 mg total) by mouth daily. (Patient not  taking: Reported on 10/10/2021), Disp: 8 tablet, Rfl: 0   DULoxetine (CYMBALTA) 30 MG capsule, Take 30 mg by mouth 2 (two) times daily., Disp: , Rfl:    famotidine (PEPCID) 20 MG tablet, Take 1 tablet (20 mg total) by mouth daily as needed for heartburn or indigestion. (Patient not taking: Reported on 10/10/2021), Disp: , Rfl:    finasteride (PROSCAR) 5 MG tablet, Take 5 mg by mouth daily. (Patient not taking: Reported on 10/10/2021), Disp: , Rfl:    Fluticasone-Umeclidin-Vilant (TRELEGY ELLIPTA) 100-62.5-25 MCG/INH AEPB, Inhale 1 puff into the lungs daily. Rinse mouth Samples of this drug were given to the patient, quantity 1, Lot Number [id number] 02/2022 (Patient not taking: Reported on 10/10/2021), Disp: 14 each, Rfl: 0  EXAM:  VITALS per patient if applicable:  GENERAL: alert, oriented, appears well and in no acute distress  PSYCH/NEURO: pleasant and cooperative, no obvious depression or anxiety, speech and thought processing grossly intact  ASSESSMENT AND PLAN:  Discussed the following assessment and plan:  Pneumonia due to infectious organism, unspecified laterality, unspecified part of lung - Plan: levofloxacin (LEVAQUIN) 750 MG tabletnx 5-7 days predniSONE (DELTASONE) 40 MG tablet x 1 week Cont prn albuterol and stioloto  Rec test covid alpha diagnostics   Bronchogenic cancer of left lung (HCC) F/u [facility]  Fever, unspecified fever cause - Plan: levofloxacin (LEVAQUIN) 750 MG tablet, predniSONE (DELTASONE) 20 MG tablet Prn tylenol if worse call 911 he states he will call [facility]   -we discussed possible serious and likely etiologies, options for evaluation and workup, limitations of telemedicine visit vs in person visit, treatment, treatment risks and precautions. Pt is agreeable to treatment via telemedicine at this moment.     I discussed the assessment and treatment plan with the patient. The patient was provided an opportunity to ask questions and all were answered. The  patient agreed with the plan and demonstrated an understanding of the instructions.    Time spent 20 minutes Delorise Jackson, MD

## 2021-10-10 NOTE — Progress Notes (Signed)
Patient having cough for a long time and congestion for a week in the chest. No Chest pain. Patient having SOB with exertion.  Cough is dry. No one around the Patient is sick. No Covid or flu test.

## 2021-10-25 NOTE — Addendum Note (Signed)
Addended by: Orland Mustard on: 10/25/2021 09:54 AM   Modules accepted: Orders

## 2021-11-06 ENCOUNTER — Telehealth: Payer: Self-pay | Admitting: Internal Medicine

## 2021-11-06 NOTE — Telephone Encounter (Signed)
Rejection Reason - Patient Declined - spoke w/pt on 10/26/21 declined appt for him and his wife" Sherrin Daisy said on Oct 26, 2021 10:10 AM  Msg from  derm

## 2021-12-04 ENCOUNTER — Ambulatory Visit: Payer: Medicare HMO | Admitting: Internal Medicine

## 2021-12-14 ENCOUNTER — Ambulatory Visit: Payer: Self-pay | Admitting: Internal Medicine

## 2021-12-25 DIAGNOSIS — J189 Pneumonia, unspecified organism: Secondary | ICD-10-CM | POA: Diagnosis not present

## 2021-12-25 DIAGNOSIS — R059 Cough, unspecified: Secondary | ICD-10-CM | POA: Diagnosis not present

## 2022-02-21 DIAGNOSIS — Z03818 Encounter for observation for suspected exposure to other biological agents ruled out: Secondary | ICD-10-CM | POA: Diagnosis not present

## 2022-02-21 DIAGNOSIS — J441 Chronic obstructive pulmonary disease with (acute) exacerbation: Secondary | ICD-10-CM | POA: Diagnosis not present

## 2022-02-28 ENCOUNTER — Ambulatory Visit: Payer: Self-pay | Admitting: Internal Medicine

## 2022-02-28 ENCOUNTER — Telehealth: Payer: Self-pay

## 2022-02-28 NOTE — Telephone Encounter (Signed)
Called pt to start telephone visit which was initially scheduled for in person with Dr.Tracy  @11am . Unable to reach lvm for pt to return call.  ?

## 2022-03-06 ENCOUNTER — Telehealth: Payer: Self-pay | Admitting: Internal Medicine

## 2022-03-06 NOTE — Telephone Encounter (Signed)
Copied from [place] [id number]. Topic: Medicare AWV ?>> Mar 06, 2022  1:44 PM Harris-Coley, Hannah Beat wrote: ?Reason for CRM: Left message for patient to schedule Annual Wellness Visit.  Please schedule with Nurse Health Advisor Denisa O'Brien-Blaney, LPN at [facility].  Please call [phone] ask for Juliann Pulse ?

## 2022-04-15 ENCOUNTER — Emergency Department (HOSPITAL_COMMUNITY): Payer: Medicare PPO

## 2022-04-15 ENCOUNTER — Other Ambulatory Visit: Payer: Self-pay

## 2022-04-15 ENCOUNTER — Inpatient Hospital Stay (HOSPITAL_COMMUNITY)
Admission: EM | Admit: 2022-04-15 | Discharge: 2022-04-25 | DRG: 981 | Disposition: A | Discharge status: Skilled Nursing Facility | Payer: Medicare PPO | Attending: Student | Admitting: Student

## 2022-04-15 ENCOUNTER — Encounter (HOSPITAL_COMMUNITY): Payer: Self-pay | Admitting: Pharmacy Technician

## 2022-04-15 DIAGNOSIS — M25561 Pain in right knee: Secondary | ICD-10-CM | POA: Diagnosis not present

## 2022-04-15 DIAGNOSIS — I482 Chronic atrial fibrillation, unspecified: Secondary | ICD-10-CM | POA: Diagnosis present

## 2022-04-15 DIAGNOSIS — S5011XA Contusion of right forearm, initial encounter: Secondary | ICD-10-CM | POA: Diagnosis present

## 2022-04-15 DIAGNOSIS — S7012XA Contusion of left thigh, initial encounter: Secondary | ICD-10-CM | POA: Diagnosis present

## 2022-04-15 DIAGNOSIS — N179 Acute kidney failure, unspecified: Secondary | ICD-10-CM | POA: Diagnosis not present

## 2022-04-15 DIAGNOSIS — I4892 Unspecified atrial flutter: Secondary | ICD-10-CM | POA: Diagnosis present

## 2022-04-15 DIAGNOSIS — Z23 Encounter for immunization: Secondary | ICD-10-CM | POA: Diagnosis present

## 2022-04-15 DIAGNOSIS — S81012A Laceration without foreign body, left knee, initial encounter: Secondary | ICD-10-CM | POA: Diagnosis present

## 2022-04-15 DIAGNOSIS — Z881 Allergy status to other antibiotic agents status: Secondary | ICD-10-CM

## 2022-04-15 DIAGNOSIS — E119 Type 2 diabetes mellitus without complications: Secondary | ICD-10-CM

## 2022-04-15 DIAGNOSIS — R42 Dizziness and giddiness: Secondary | ICD-10-CM

## 2022-04-15 DIAGNOSIS — T796XXA Traumatic ischemia of muscle, initial encounter: Secondary | ICD-10-CM | POA: Diagnosis present

## 2022-04-15 DIAGNOSIS — F03A0 Unspecified dementia, mild, without behavioral disturbance, psychotic disturbance, mood disturbance, and anxiety: Secondary | ICD-10-CM | POA: Diagnosis present

## 2022-04-15 DIAGNOSIS — C3492 Malignant neoplasm of unspecified part of left bronchus or lung: Secondary | ICD-10-CM | POA: Diagnosis present

## 2022-04-15 DIAGNOSIS — E878 Other disorders of electrolyte and fluid balance, not elsewhere classified: Secondary | ICD-10-CM

## 2022-04-15 DIAGNOSIS — J3489 Other specified disorders of nose and nasal sinuses: Secondary | ICD-10-CM | POA: Diagnosis not present

## 2022-04-15 DIAGNOSIS — I700 Atherosclerosis of aorta: Secondary | ICD-10-CM | POA: Diagnosis not present

## 2022-04-15 DIAGNOSIS — G928 Other toxic encephalopathy: Secondary | ICD-10-CM | POA: Diagnosis present

## 2022-04-15 DIAGNOSIS — D62 Acute posthemorrhagic anemia: Secondary | ICD-10-CM | POA: Diagnosis present

## 2022-04-15 DIAGNOSIS — I160 Hypertensive urgency: Secondary | ICD-10-CM | POA: Diagnosis present

## 2022-04-15 DIAGNOSIS — Z888 Allergy status to other drugs, medicaments and biological substances status: Secondary | ICD-10-CM

## 2022-04-15 DIAGNOSIS — S7010XA Contusion of unspecified thigh, initial encounter: Secondary | ICD-10-CM | POA: Diagnosis present

## 2022-04-15 DIAGNOSIS — F4310 Post-traumatic stress disorder, unspecified: Secondary | ICD-10-CM | POA: Diagnosis present

## 2022-04-15 DIAGNOSIS — R269 Unspecified abnormalities of gait and mobility: Secondary | ICD-10-CM

## 2022-04-15 DIAGNOSIS — D649 Anemia, unspecified: Secondary | ICD-10-CM | POA: Diagnosis not present

## 2022-04-15 DIAGNOSIS — Z923 Personal history of irradiation: Secondary | ICD-10-CM

## 2022-04-15 DIAGNOSIS — Z87891 Personal history of nicotine dependence: Secondary | ICD-10-CM

## 2022-04-15 DIAGNOSIS — S7011XA Contusion of right thigh, initial encounter: Secondary | ICD-10-CM | POA: Diagnosis not present

## 2022-04-15 DIAGNOSIS — I4891 Unspecified atrial fibrillation: Secondary | ICD-10-CM | POA: Diagnosis not present

## 2022-04-15 DIAGNOSIS — N400 Enlarged prostate without lower urinary tract symptoms: Secondary | ICD-10-CM | POA: Diagnosis present

## 2022-04-15 DIAGNOSIS — E876 Hypokalemia: Secondary | ICD-10-CM | POA: Diagnosis not present

## 2022-04-15 DIAGNOSIS — M25562 Pain in left knee: Secondary | ICD-10-CM | POA: Diagnosis not present

## 2022-04-15 DIAGNOSIS — S7011XD Contusion of right thigh, subsequent encounter: Secondary | ICD-10-CM | POA: Diagnosis not present

## 2022-04-15 DIAGNOSIS — R410 Disorientation, unspecified: Secondary | ICD-10-CM | POA: Diagnosis not present

## 2022-04-15 DIAGNOSIS — J9601 Acute respiratory failure with hypoxia: Secondary | ICD-10-CM | POA: Diagnosis not present

## 2022-04-15 DIAGNOSIS — Z6831 Body mass index (BMI) 31.0-31.9, adult: Secondary | ICD-10-CM

## 2022-04-15 DIAGNOSIS — D72829 Elevated white blood cell count, unspecified: Secondary | ICD-10-CM | POA: Diagnosis not present

## 2022-04-15 DIAGNOSIS — I10 Essential (primary) hypertension: Secondary | ICD-10-CM | POA: Diagnosis not present

## 2022-04-15 DIAGNOSIS — E872 Acidosis, unspecified: Secondary | ICD-10-CM | POA: Diagnosis present

## 2022-04-15 DIAGNOSIS — S51811A Laceration without foreign body of right forearm, initial encounter: Secondary | ICD-10-CM | POA: Diagnosis present

## 2022-04-15 DIAGNOSIS — Z7982 Long term (current) use of aspirin: Secondary | ICD-10-CM

## 2022-04-15 DIAGNOSIS — I6782 Cerebral ischemia: Secondary | ICD-10-CM | POA: Diagnosis not present

## 2022-04-15 DIAGNOSIS — E669 Obesity, unspecified: Secondary | ICD-10-CM | POA: Diagnosis present

## 2022-04-15 DIAGNOSIS — Z91014 Allergy to mammalian meats: Secondary | ICD-10-CM

## 2022-04-15 DIAGNOSIS — Z801 Family history of malignant neoplasm of trachea, bronchus and lung: Secondary | ICD-10-CM

## 2022-04-15 DIAGNOSIS — S3993XA Unspecified injury of pelvis, initial encounter: Secondary | ICD-10-CM | POA: Diagnosis not present

## 2022-04-15 DIAGNOSIS — G9340 Encephalopathy, unspecified: Secondary | ICD-10-CM | POA: Diagnosis not present

## 2022-04-15 DIAGNOSIS — I129 Hypertensive chronic kidney disease with stage 1 through stage 4 chronic kidney disease, or unspecified chronic kidney disease: Secondary | ICD-10-CM | POA: Diagnosis present

## 2022-04-15 DIAGNOSIS — R1314 Dysphagia, pharyngoesophageal phase: Secondary | ICD-10-CM | POA: Diagnosis present

## 2022-04-15 DIAGNOSIS — M4696 Unspecified inflammatory spondylopathy, lumbar region: Secondary | ICD-10-CM | POA: Diagnosis not present

## 2022-04-15 DIAGNOSIS — N323 Diverticulum of bladder: Secondary | ICD-10-CM | POA: Diagnosis not present

## 2022-04-15 DIAGNOSIS — G319 Degenerative disease of nervous system, unspecified: Secondary | ICD-10-CM | POA: Diagnosis not present

## 2022-04-15 DIAGNOSIS — M7989 Other specified soft tissue disorders: Secondary | ICD-10-CM | POA: Diagnosis not present

## 2022-04-15 DIAGNOSIS — F05 Delirium due to known physiological condition: Secondary | ICD-10-CM | POA: Diagnosis present

## 2022-04-15 DIAGNOSIS — N1832 Chronic kidney disease, stage 3b: Secondary | ICD-10-CM | POA: Diagnosis not present

## 2022-04-15 DIAGNOSIS — C9110 Chronic lymphocytic leukemia of B-cell type not having achieved remission: Secondary | ICD-10-CM | POA: Diagnosis present

## 2022-04-15 DIAGNOSIS — I959 Hypotension, unspecified: Secondary | ICD-10-CM | POA: Diagnosis not present

## 2022-04-15 DIAGNOSIS — D638 Anemia in other chronic diseases classified elsewhere: Secondary | ICD-10-CM | POA: Diagnosis present

## 2022-04-15 DIAGNOSIS — I6529 Occlusion and stenosis of unspecified carotid artery: Secondary | ICD-10-CM | POA: Diagnosis present

## 2022-04-15 DIAGNOSIS — J449 Chronic obstructive pulmonary disease, unspecified: Secondary | ICD-10-CM | POA: Diagnosis present

## 2022-04-15 DIAGNOSIS — K219 Gastro-esophageal reflux disease without esophagitis: Secondary | ICD-10-CM | POA: Diagnosis present

## 2022-04-15 DIAGNOSIS — E785 Hyperlipidemia, unspecified: Secondary | ICD-10-CM | POA: Diagnosis present

## 2022-04-15 DIAGNOSIS — S12500A Unspecified displaced fracture of sixth cervical vertebra, initial encounter for closed fracture: Secondary | ICD-10-CM | POA: Diagnosis not present

## 2022-04-15 DIAGNOSIS — Z7901 Long term (current) use of anticoagulants: Secondary | ICD-10-CM

## 2022-04-15 DIAGNOSIS — Z8673 Personal history of transient ischemic attack (TIA), and cerebral infarction without residual deficits: Secondary | ICD-10-CM

## 2022-04-15 DIAGNOSIS — N189 Chronic kidney disease, unspecified: Secondary | ICD-10-CM

## 2022-04-15 DIAGNOSIS — J929 Pleural plaque without asbestos: Secondary | ICD-10-CM | POA: Diagnosis not present

## 2022-04-15 DIAGNOSIS — S0181XA Laceration without foreign body of other part of head, initial encounter: Secondary | ICD-10-CM | POA: Diagnosis present

## 2022-04-15 DIAGNOSIS — E1122 Type 2 diabetes mellitus with diabetic chronic kidney disease: Secondary | ICD-10-CM | POA: Diagnosis present

## 2022-04-15 DIAGNOSIS — J439 Emphysema, unspecified: Secondary | ICD-10-CM | POA: Diagnosis present

## 2022-04-15 DIAGNOSIS — S3991XA Unspecified injury of abdomen, initial encounter: Secondary | ICD-10-CM | POA: Diagnosis not present

## 2022-04-15 DIAGNOSIS — J849 Interstitial pulmonary disease, unspecified: Secondary | ICD-10-CM | POA: Diagnosis not present

## 2022-04-15 DIAGNOSIS — T07XXXA Unspecified multiple injuries, initial encounter: Secondary | ICD-10-CM | POA: Diagnosis not present

## 2022-04-15 DIAGNOSIS — M2578 Osteophyte, vertebrae: Secondary | ICD-10-CM | POA: Diagnosis not present

## 2022-04-15 DIAGNOSIS — Z20822 Contact with and (suspected) exposure to covid-19: Secondary | ICD-10-CM | POA: Diagnosis present

## 2022-04-15 DIAGNOSIS — S0511XA Contusion of eyeball and orbital tissues, right eye, initial encounter: Secondary | ICD-10-CM | POA: Diagnosis not present

## 2022-04-15 DIAGNOSIS — Y92410 Unspecified street and highway as the place of occurrence of the external cause: Secondary | ICD-10-CM | POA: Diagnosis not present

## 2022-04-15 DIAGNOSIS — S299XXA Unspecified injury of thorax, initial encounter: Secondary | ICD-10-CM | POA: Diagnosis not present

## 2022-04-15 DIAGNOSIS — N1831 Chronic kidney disease, stage 3a: Secondary | ICD-10-CM | POA: Diagnosis present

## 2022-04-15 DIAGNOSIS — T148XXA Other injury of unspecified body region, initial encounter: Principal | ICD-10-CM

## 2022-04-15 DIAGNOSIS — Z7401 Bed confinement status: Secondary | ICD-10-CM | POA: Diagnosis not present

## 2022-04-15 DIAGNOSIS — Z79899 Other long term (current) drug therapy: Secondary | ICD-10-CM

## 2022-04-15 DIAGNOSIS — Z806 Family history of leukemia: Secondary | ICD-10-CM

## 2022-04-15 DIAGNOSIS — Z8249 Family history of ischemic heart disease and other diseases of the circulatory system: Secondary | ICD-10-CM

## 2022-04-15 DIAGNOSIS — Z9181 History of falling: Secondary | ICD-10-CM

## 2022-04-15 DIAGNOSIS — R918 Other nonspecific abnormal finding of lung field: Secondary | ICD-10-CM | POA: Diagnosis not present

## 2022-04-15 DIAGNOSIS — S81011A Laceration without foreign body, right knee, initial encounter: Secondary | ICD-10-CM | POA: Diagnosis present

## 2022-04-15 LAB — CBC
HCT: 35.6 % — ABNORMAL LOW (ref 39.0–52.0)
Hemoglobin: 10.9 g/dL — ABNORMAL LOW (ref 13.0–17.0)
MCH: 27.9 pg (ref 26.0–34.0)
MCHC: 30.6 g/dL (ref 30.0–36.0)
MCV: 91.3 fL (ref 80.0–100.0)
Platelets: 234 10*3/uL (ref 150–400)
RBC: 3.9 MIL/uL — ABNORMAL LOW (ref 4.22–5.81)
RDW: 16.9 % — ABNORMAL HIGH (ref 11.5–15.5)
WBC: 16.8 10*3/uL — ABNORMAL HIGH (ref 4.0–10.5)
nRBC: 0 % (ref 0.0–0.2)

## 2022-04-15 LAB — I-STAT CHEM 8, ED
BUN: 24 mg/dL — ABNORMAL HIGH (ref 8–23)
Calcium, Ion: 1.22 mmol/L (ref 1.15–1.40)
Chloride: 110 mmol/L (ref 98–111)
Creatinine, Ser: 1.6 mg/dL — ABNORMAL HIGH (ref 0.61–1.24)
Glucose, Bld: 118 mg/dL — ABNORMAL HIGH (ref 70–99)
HCT: 33 % — ABNORMAL LOW (ref 39.0–52.0)
Hemoglobin: 11.2 g/dL — ABNORMAL LOW (ref 13.0–17.0)
Potassium: 4.5 mmol/L (ref 3.5–5.1)
Sodium: 141 mmol/L (ref 135–145)
TCO2: 21 mmol/L — ABNORMAL LOW (ref 22–32)

## 2022-04-15 LAB — COMPREHENSIVE METABOLIC PANEL
ALT: 18 U/L (ref 0–44)
AST: 24 U/L (ref 15–41)
Albumin: 3.8 g/dL (ref 3.5–5.0)
Alkaline Phosphatase: 69 U/L (ref 38–126)
Anion gap: 7 (ref 5–15)
BUN: 21 mg/dL (ref 8–23)
CO2: 21 mmol/L — ABNORMAL LOW (ref 22–32)
Calcium: 9.9 mg/dL (ref 8.9–10.3)
Chloride: 113 mmol/L — ABNORMAL HIGH (ref 98–111)
Creatinine, Ser: 1.5 mg/dL — ABNORMAL HIGH (ref 0.61–1.24)
GFR, Estimated: 45 mL/min — ABNORMAL LOW (ref >60)
Glucose, Bld: 123 mg/dL — ABNORMAL HIGH (ref 70–99)
Potassium: 4.5 mmol/L (ref 3.5–5.1)
Sodium: 141 mmol/L (ref 135–145)
Total Bilirubin: 0.6 mg/dL (ref 0.3–1.2)
Total Protein: 6.6 g/dL (ref 6.5–8.1)

## 2022-04-15 LAB — ETHANOL: Alcohol, Ethyl (B): <10 mg/dL (ref <10)

## 2022-04-15 LAB — LACTIC ACID, PLASMA: Lactic Acid, Venous: 1.4 mmol/L (ref 0.5–1.9)

## 2022-04-15 LAB — PROTIME-INR
INR: 1 (ref 0.8–1.2)
Prothrombin Time: 12.9 seconds (ref 11.4–15.2)

## 2022-04-15 LAB — SAMPLE TO BLOOD BANK

## 2022-04-15 LAB — RESP PANEL BY RT-PCR (FLU A&B, COVID) ARPGX2
Influenza A by PCR: NEGATIVE
Influenza B by PCR: NEGATIVE
SARS Coronavirus 2 by RT PCR: NEGATIVE

## 2022-04-15 LAB — CK: Total CK: 1161 U/L — ABNORMAL HIGH (ref 49–397)

## 2022-04-15 MED ORDER — LORAZEPAM 2 MG/ML IJ SOLN
0.5000 mg | Freq: Four times a day (QID) | INTRAMUSCULAR | Status: DC | PRN
  Administered 2022-04-15 – 2022-04-16 (×3): 0.5 mg via INTRAVENOUS
  Filled 2022-04-15 (×3): qty 1

## 2022-04-15 MED ORDER — METOPROLOL TARTRATE 5 MG/5ML IV SOLN
5.0000 mg | Freq: Once | INTRAVENOUS | Status: AC
  Administered 2022-04-15: 5 mg via INTRAVENOUS
  Filled 2022-04-15: qty 5

## 2022-04-15 MED ORDER — ALBUTEROL SULFATE (2.5 MG/3ML) 0.083% IN NEBU: 2.5000 mg | INHALATION_SOLUTION | Freq: Four times a day (QID) | RESPIRATORY_TRACT | Status: DC | PRN

## 2022-04-15 MED ORDER — UMECLIDINIUM BROMIDE 62.5 MCG/ACT IN AEPB
1.0000 | INHALATION_SPRAY | Freq: Every day | RESPIRATORY_TRACT | Status: DC
  Administered 2022-04-19 – 2022-04-25 (×6): 1 via RESPIRATORY_TRACT
  Filled 2022-04-15: qty 7

## 2022-04-15 MED ORDER — MORPHINE SULFATE (PF) 2 MG/ML IV SOLN
2.0000 mg | INTRAVENOUS | Status: AC | PRN
  Administered 2022-04-15 – 2022-04-17 (×3): 2 mg via INTRAVENOUS
  Filled 2022-04-15 (×3): qty 1

## 2022-04-15 MED ORDER — LORAZEPAM 2 MG/ML IJ SOLN
1.0000 mg | Freq: Once | INTRAMUSCULAR | Status: AC
  Administered 2022-04-15: 1 mg via INTRAVENOUS
  Filled 2022-04-15: qty 1

## 2022-04-15 MED ORDER — ACETAMINOPHEN 650 MG RE SUPP
650.0000 mg | Freq: Four times a day (QID) | RECTAL | Status: DC | PRN
Start: 2022-04-15 — End: 2022-04-18

## 2022-04-15 MED ORDER — METOPROLOL TARTRATE 50 MG PO TABS
50.0000 mg | ORAL_TABLET | Freq: Two times a day (BID) | ORAL | Status: DC
  Filled 2022-04-15: qty 2

## 2022-04-15 MED ORDER — IOHEXOL 300 MG/ML  SOLN
100.0000 mL | Freq: Once | INTRAMUSCULAR | Status: AC | PRN
Start: 2022-04-15 — End: 2022-04-15
  Administered 2022-04-15: 80 mL via INTRAVENOUS

## 2022-04-15 MED ORDER — SODIUM CHLORIDE 0.9% FLUSH
3.0000 mL | Freq: Two times a day (BID) | INTRAVENOUS | Status: DC
  Administered 2022-04-15 – 2022-04-25 (×15): 3 mL via INTRAVENOUS

## 2022-04-15 MED ORDER — ACETAMINOPHEN 325 MG PO TABS: 650.0000 mg | ORAL_TABLET | Freq: Four times a day (QID) | ORAL | Status: DC | PRN

## 2022-04-15 MED ORDER — DIPHENHYDRAMINE HCL 50 MG/ML IJ SOLN
25.0000 mg | Freq: Once | INTRAMUSCULAR | Status: AC
  Administered 2022-04-15: 25 mg via INTRAVENOUS

## 2022-04-15 MED ORDER — ARFORMOTEROL TARTRATE 15 MCG/2ML IN NEBU
15.0000 ug | INHALATION_SOLUTION | Freq: Two times a day (BID) | RESPIRATORY_TRACT | Status: DC
  Administered 2022-04-16 – 2022-04-25 (×16): 15 ug via RESPIRATORY_TRACT
  Filled 2022-04-15 (×16): qty 2

## 2022-04-15 MED ORDER — HYDRALAZINE HCL 20 MG/ML IJ SOLN
10.0000 mg | INTRAMUSCULAR | Status: DC | PRN
  Administered 2022-04-22: 10 mg via INTRAVENOUS
  Filled 2022-04-15: qty 1

## 2022-04-15 MED ORDER — CEFAZOLIN SODIUM-DEXTROSE 2-4 GM/100ML-% IV SOLN
2.0000 g | Freq: Once | INTRAVENOUS | Status: AC
  Administered 2022-04-15: 2 g via INTRAVENOUS

## 2022-04-15 MED ORDER — SODIUM CHLORIDE 0.9 % IV SOLN: INTRAVENOUS | Status: AC

## 2022-04-15 MED ORDER — TETANUS-DIPHTH-ACELL PERTUSSIS 5-2.5-18.5 LF-MCG/0.5 IM SUSY
0.5000 mL | PREFILLED_SYRINGE | Freq: Once | INTRAMUSCULAR | Status: AC
  Administered 2022-04-15: 0.5 mL via INTRAMUSCULAR
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
## 2022-04-15 NOTE — ED Notes (Signed)
Pt incontinent urine, bed pad changed.
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
## 2022-04-15 NOTE — ED Notes (Signed)
Pt's daughter went home, took pt's belongings.
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
## 2022-04-15 NOTE — ED Provider Notes (Signed)
..Laceration Repair  Date/Time: 04/15/2022 2:58 PM Performed by: Tacy Learn, PA-C Authorized by: Tacy Learn, PA-C   Consent:    Consent obtained:  Verbal   Consent given by:  Patient (daughter Mardene Celeste at bedside)   Risks discussed:  Infection, need for additional repair, pain, poor cosmetic result and poor wound healing   Alternatives discussed:  No treatment and delayed treatment Universal protocol:    Procedure explained and questions answered to patient or proxy's satisfaction: yes     Relevant documents present and verified: yes     Test results available: yes     Imaging studies available: yes     Required blood products, implants, devices, and special equipment available: yes     Site/side marked: yes     Immediately prior to procedure, a time out was called: yes     Patient identity confirmed:  Verbally with patient Anesthesia:    Anesthesia method:  Local infiltration   Local anesthetic:  Lidocaine 2% WITH epi Laceration details:    Location:  Scalp   Scalp location:  L temporal   Length (cm):  0.6   Depth (mm):  2 Pre-procedure details:    Preparation:  Patient was prepped and draped in usual sterile fashion and imaging obtained to evaluate for foreign bodies Exploration:    Hemostasis achieved with:  Epinephrine and direct pressure   Imaging obtained: x-ray     Imaging outcome: foreign body not noted     Wound exploration: wound explored through full range of motion and entire depth of wound visualized     Wound extent: no muscle damage noted, no underlying fracture noted and no vascular damage noted     Contaminated: no   Treatment:    Area cleansed with:  Saline   Amount of cleaning:  Extensive   Irrigation solution:  Sterile saline Skin repair:    Repair method:  Sutures   Suture size:  4-0   Suture material:  Prolene   Suture technique:  Simple interrupted   Number of sutures:  3 Approximation:    Approximation:  Close Repair type:    Repair  type:  Simple Post-procedure details:    Dressing:  Open (no dressing)   Procedure completion:  Tolerated well, no immediate complications .Marland KitchenLaceration Repair  Date/Time: 04/15/2022 2:59 PM Performed by: Tacy Learn, PA-C Authorized by: Tacy Learn, PA-C   Consent:    Consent obtained:  Verbal   Consent given by:  Patient   Risks discussed:  Infection, need for additional repair, pain, poor cosmetic result and poor wound healing   Alternatives discussed:  No treatment and delayed treatment Universal protocol:    Procedure explained and questions answered to patient or proxy's satisfaction: yes     Relevant documents present and verified: yes     Test results available: yes     Imaging studies available: yes     Required blood products, implants, devices, and special equipment available: yes     Site/side marked: yes     Immediately prior to procedure, a time out was called: yes     Patient identity confirmed:  Verbally with patient Anesthesia:    Anesthesia method:  Local infiltration   Local anesthetic:  Lidocaine 2% WITH epi Laceration details:    Location:  Leg   Leg location:  L knee   Length (cm):  2   Depth (mm):  4 Pre-procedure details:    Preparation:  Patient was prepped  and draped in usual sterile fashion and imaging obtained to evaluate for foreign bodies Exploration:    Hemostasis achieved with:  Epinephrine   Imaging obtained: x-ray     Imaging outcome: foreign body not noted     Wound exploration: wound explored through full range of motion and entire depth of wound visualized     Wound extent: no foreign bodies/material noted, no muscle damage noted, no underlying fracture noted and no vascular damage noted     Contaminated: no   Treatment:    Area cleansed with:  Saline   Amount of cleaning:  Extensive   Irrigation solution:  Sterile saline Skin repair:    Repair method:  Staples   Number of staples:  2 Approximation:    Approximation:   Close Repair type:    Repair type:  Simple Post-procedure details:    Dressing:  Open (no dressing)   Procedure completion:  Tolerated well, no immediate complications .Marland KitchenLaceration Repair  Date/Time: 04/15/2022 3:00 PM Performed by: Tacy Learn, PA-C Authorized by: Tacy Learn, PA-C   Consent:    Consent obtained:  Verbal   Consent given by:  Patient   Risks discussed:  Infection, need for additional repair, pain, poor cosmetic result and poor wound healing   Alternatives discussed:  No treatment and delayed treatment Universal protocol:    Procedure explained and questions answered to patient or proxy's satisfaction: yes     Relevant documents present and verified: yes     Test results available: yes     Imaging studies available: yes     Required blood products, implants, devices, and special equipment available: yes     Site/side marked: yes     Immediately prior to procedure, a time out was called: yes     Patient identity confirmed:  Verbally with patient Anesthesia:    Anesthesia method:  None Laceration details:    Location:  Shoulder/arm   Shoulder/arm location:  R lower arm   Length (cm):  10   Depth (mm):  2 Pre-procedure details:    Preparation:  Patient was prepped and draped in usual sterile fashion and imaging obtained to evaluate for foreign bodies Exploration:    Hemostasis achieved with:  Direct pressure   Imaging obtained: x-ray     Imaging outcome: foreign body not noted     Wound exploration: wound explored through full range of motion and entire depth of wound visualized     Wound extent: no muscle damage noted, no underlying fracture noted and no vascular damage noted     Contaminated: no   Treatment:    Area cleansed with:  Saline   Amount of cleaning:  Standard   Irrigation solution:  Sterile saline Skin repair:    Repair method:  Steri-Strips   Number of Steri-Strips:  6 Approximation:    Approximation:  Close Repair type:    Repair  type:  Simple Post-procedure details:    Dressing:  Open (no dressing)   Procedure completion:  Tolerated well, no immediate complications    Tacy Learn, PA-C 04/15/22 [place], [place], DO 04/15/22 2000
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
## 2022-04-16 DIAGNOSIS — S5011XA Contusion of right forearm, initial encounter: Secondary | ICD-10-CM | POA: Diagnosis present

## 2022-04-16 DIAGNOSIS — S7011XD Contusion of right thigh, subsequent encounter: Secondary | ICD-10-CM | POA: Diagnosis not present

## 2022-04-16 DIAGNOSIS — E872 Acidosis, unspecified: Secondary | ICD-10-CM | POA: Diagnosis present

## 2022-04-16 DIAGNOSIS — I129 Hypertensive chronic kidney disease with stage 1 through stage 4 chronic kidney disease, or unspecified chronic kidney disease: Secondary | ICD-10-CM | POA: Diagnosis present

## 2022-04-16 DIAGNOSIS — D638 Anemia in other chronic diseases classified elsewhere: Secondary | ICD-10-CM | POA: Diagnosis present

## 2022-04-16 DIAGNOSIS — J439 Emphysema, unspecified: Secondary | ICD-10-CM | POA: Diagnosis present

## 2022-04-16 DIAGNOSIS — T796XXA Traumatic ischemia of muscle, initial encounter: Secondary | ICD-10-CM | POA: Diagnosis present

## 2022-04-16 DIAGNOSIS — Y92410 Unspecified street and highway as the place of occurrence of the external cause: Secondary | ICD-10-CM | POA: Diagnosis not present

## 2022-04-16 DIAGNOSIS — E669 Obesity, unspecified: Secondary | ICD-10-CM | POA: Diagnosis present

## 2022-04-16 DIAGNOSIS — F03A0 Unspecified dementia, mild, without behavioral disturbance, psychotic disturbance, mood disturbance, and anxiety: Secondary | ICD-10-CM | POA: Diagnosis present

## 2022-04-16 DIAGNOSIS — I482 Chronic atrial fibrillation, unspecified: Secondary | ICD-10-CM | POA: Diagnosis present

## 2022-04-16 DIAGNOSIS — N179 Acute kidney failure, unspecified: Secondary | ICD-10-CM | POA: Diagnosis present

## 2022-04-16 DIAGNOSIS — G9340 Encephalopathy, unspecified: Secondary | ICD-10-CM | POA: Diagnosis not present

## 2022-04-16 DIAGNOSIS — Z20822 Contact with and (suspected) exposure to covid-19: Secondary | ICD-10-CM | POA: Diagnosis present

## 2022-04-16 DIAGNOSIS — R42 Dizziness and giddiness: Secondary | ICD-10-CM | POA: Diagnosis present

## 2022-04-16 DIAGNOSIS — S81012A Laceration without foreign body, left knee, initial encounter: Secondary | ICD-10-CM | POA: Diagnosis present

## 2022-04-16 DIAGNOSIS — C9110 Chronic lymphocytic leukemia of B-cell type not having achieved remission: Secondary | ICD-10-CM | POA: Diagnosis present

## 2022-04-16 DIAGNOSIS — G928 Other toxic encephalopathy: Secondary | ICD-10-CM | POA: Diagnosis present

## 2022-04-16 DIAGNOSIS — R1314 Dysphagia, pharyngoesophageal phase: Secondary | ICD-10-CM | POA: Diagnosis present

## 2022-04-16 DIAGNOSIS — D62 Acute posthemorrhagic anemia: Secondary | ICD-10-CM | POA: Diagnosis present

## 2022-04-16 DIAGNOSIS — E1122 Type 2 diabetes mellitus with diabetic chronic kidney disease: Secondary | ICD-10-CM | POA: Diagnosis present

## 2022-04-16 DIAGNOSIS — N1831 Chronic kidney disease, stage 3a: Secondary | ICD-10-CM | POA: Diagnosis present

## 2022-04-16 DIAGNOSIS — I6529 Occlusion and stenosis of unspecified carotid artery: Secondary | ICD-10-CM | POA: Diagnosis present

## 2022-04-16 DIAGNOSIS — I4892 Unspecified atrial flutter: Secondary | ICD-10-CM | POA: Diagnosis present

## 2022-04-16 DIAGNOSIS — Z23 Encounter for immunization: Secondary | ICD-10-CM | POA: Diagnosis not present

## 2022-04-16 DIAGNOSIS — C3492 Malignant neoplasm of unspecified part of left bronchus or lung: Secondary | ICD-10-CM | POA: Diagnosis present

## 2022-04-16 DIAGNOSIS — S0181XA Laceration without foreign body of other part of head, initial encounter: Secondary | ICD-10-CM | POA: Diagnosis present

## 2022-04-16 DIAGNOSIS — F05 Delirium due to known physiological condition: Secondary | ICD-10-CM | POA: Diagnosis present

## 2022-04-16 LAB — COMPREHENSIVE METABOLIC PANEL
ALT: 17 U/L (ref 0–44)
AST: 40 U/L (ref 15–41)
Albumin: 2.7 g/dL — ABNORMAL LOW (ref 3.5–5.0)
Alkaline Phosphatase: 44 U/L (ref 38–126)
Anion gap: 4 — ABNORMAL LOW (ref 5–15)
BUN: 19 mg/dL (ref 8–23)
CO2: 15 mmol/L — ABNORMAL LOW (ref 22–32)
Calcium: 7.3 mg/dL — ABNORMAL LOW (ref 8.9–10.3)
Chloride: 123 mmol/L — ABNORMAL HIGH (ref 98–111)
Creatinine, Ser: 1.22 mg/dL (ref 0.61–1.24)
GFR, Estimated: 57 mL/min — ABNORMAL LOW (ref >60)
Glucose, Bld: 122 mg/dL — ABNORMAL HIGH (ref 70–99)
Potassium: 3.2 mmol/L — ABNORMAL LOW (ref 3.5–5.1)
Sodium: 142 mmol/L (ref 135–145)
Total Bilirubin: 0.5 mg/dL (ref 0.3–1.2)
Total Protein: 4.6 g/dL — ABNORMAL LOW (ref 6.5–8.1)

## 2022-04-16 LAB — URINALYSIS, ROUTINE W REFLEX MICROSCOPIC
Bacteria, UA: NONE SEEN
Bilirubin Urine: NEGATIVE
Glucose, UA: NEGATIVE mg/dL
Ketones, ur: NEGATIVE mg/dL
Leukocytes,Ua: NEGATIVE
Nitrite: NEGATIVE
Protein, ur: 100 mg/dL — AB
Specific Gravity, Urine: >1.046 — ABNORMAL HIGH (ref 1.005–1.030)
pH: 5 (ref 5.0–8.0)

## 2022-04-16 LAB — CBC
HCT: 27.3 % — ABNORMAL LOW (ref 39.0–52.0)
Hemoglobin: 8.2 g/dL — ABNORMAL LOW (ref 13.0–17.0)
MCH: 27.9 pg (ref 26.0–34.0)
MCHC: 30 g/dL (ref 30.0–36.0)
MCV: 92.9 fL (ref 80.0–100.0)
Platelets: 184 10*3/uL (ref 150–400)
RBC: 2.94 MIL/uL — ABNORMAL LOW (ref 4.22–5.81)
RDW: 17.1 % — ABNORMAL HIGH (ref 11.5–15.5)
WBC: 13.6 10*3/uL — ABNORMAL HIGH (ref 4.0–10.5)
nRBC: 0 % (ref 0.0–0.2)

## 2022-04-16 LAB — CK: Total CK: 1082 U/L — ABNORMAL HIGH (ref 49–397)

## 2022-04-16 LAB — MAGNESIUM: Magnesium: 1.6 mg/dL — ABNORMAL LOW (ref 1.7–2.4)

## 2022-04-16 MED ORDER — HALOPERIDOL LACTATE 5 MG/ML IJ SOLN
3.0000 mg | Freq: Once | INTRAMUSCULAR | Status: AC | PRN
Start: 2022-04-16 — End: 2022-04-16
  Administered 2022-04-16: 3 mg via INTRAVENOUS
  Filled 2022-04-16: qty 1

## 2022-04-16 MED ORDER — LORAZEPAM 2 MG/ML IJ SOLN
1.0000 mg | Freq: Four times a day (QID) | INTRAMUSCULAR | Status: DC | PRN
  Administered 2022-04-16 – 2022-04-17 (×3): 1 mg via INTRAVENOUS
  Filled 2022-04-16 (×3): qty 1

## 2022-04-16 MED ORDER — MAGNESIUM SULFATE 4 GM/100ML IV SOLN
4.0000 g | Freq: Once | INTRAVENOUS | Status: AC
  Administered 2022-04-16: 4 g via INTRAVENOUS
  Filled 2022-04-16: qty 100

## 2022-04-16 MED ORDER — POTASSIUM CHLORIDE CRYS ER 20 MEQ PO TBCR: 40.0000 meq | EXTENDED_RELEASE_TABLET | Freq: Two times a day (BID) | ORAL | Status: DC

## 2022-04-16 MED ORDER — HALOPERIDOL LACTATE 5 MG/ML IJ SOLN
2.0000 mg | Freq: Four times a day (QID) | INTRAMUSCULAR | Status: DC | PRN
Start: 2022-04-16 — End: 2022-04-25
  Administered 2022-04-16 – 2022-04-19 (×4): 2 mg via INTRAVENOUS
  Filled 2022-04-16 (×4): qty 1

## 2022-04-16 MED ORDER — POTASSIUM CHLORIDE 10 MEQ/100ML IV SOLN
10.0000 meq | INTRAVENOUS | Status: AC
  Administered 2022-04-16 (×2): 10 meq via INTRAVENOUS
  Filled 2022-04-16 (×2): qty 100

## 2022-04-16 MED ORDER — METOPROLOL TARTRATE 5 MG/5ML IV SOLN
2.5000 mg | Freq: Three times a day (TID) | INTRAVENOUS | Status: DC
  Administered 2022-04-16 – 2022-04-19 (×9): 2.5 mg via INTRAVENOUS
  Filled 2022-04-16 (×8): qty 5

## 2022-04-16 NOTE — Progress Notes (Signed)
PT Cancellation Note  Patient Details Name: Donald [name] MRN: [id number] DOB: [birth year]   Cancelled Treatment:    Reason Eval/Treat Not Completed: Other (comment). Per nsg, pt is very confused and unable to follow commands.  Retry at another time.   Ramond Dial 04/16/2022, 12:17 PM  Mee Hives, PT PhD Acute Rehab Dept. Number: [place]

## 2022-04-16 NOTE — ED Notes (Signed)
Lunch order placed
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
## 2022-04-16 NOTE — Progress Notes (Signed)
OT Cancellation Note  Patient Details Name: Donald [name] MRN: [id number] DOB: September 14, 1933   Cancelled Treatment:    Reason Eval/Treat Not Completed: Other (comment)- per PT who spoke to RN, reports pt remains confused and not appropriate for OT at this time.  OT will follow and see as able.   Jolaine Artist, OT Acute Rehabilitation Services Office [phone]   Delight Stare 04/16/2022, 12:21 PM

## 2022-04-16 NOTE — ED Notes (Signed)
Patient restless at this time, pulling at primofit.  Patient pulled primofit off and was incontinent of urine.  Primofit removed, patient cleaned and new chux and brief placed on patient.  Patient remains restless.

## 2022-04-16 NOTE — ED Notes (Signed)
Pt continues to be agitated, attempting to pull mittens and cardiac monitoring off.
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
## 2022-04-17 DIAGNOSIS — C9110 Chronic lymphocytic leukemia of B-cell type not having achieved remission: Secondary | ICD-10-CM | POA: Diagnosis not present

## 2022-04-17 DIAGNOSIS — E669 Obesity, unspecified: Secondary | ICD-10-CM

## 2022-04-17 DIAGNOSIS — G9340 Encephalopathy, unspecified: Secondary | ICD-10-CM | POA: Diagnosis not present

## 2022-04-17 DIAGNOSIS — E878 Other disorders of electrolyte and fluid balance, not elsewhere classified: Secondary | ICD-10-CM

## 2022-04-17 DIAGNOSIS — T796XXA Traumatic ischemia of muscle, initial encounter: Secondary | ICD-10-CM

## 2022-04-17 DIAGNOSIS — E876 Hypokalemia: Secondary | ICD-10-CM

## 2022-04-17 DIAGNOSIS — N1831 Chronic kidney disease, stage 3a: Secondary | ICD-10-CM

## 2022-04-17 DIAGNOSIS — D62 Acute posthemorrhagic anemia: Secondary | ICD-10-CM

## 2022-04-17 DIAGNOSIS — S7011XD Contusion of right thigh, subsequent encounter: Secondary | ICD-10-CM | POA: Diagnosis not present

## 2022-04-17 LAB — CK: Total CK: 1734 U/L — ABNORMAL HIGH (ref 49–397)

## 2022-04-17 LAB — CBC WITH DIFFERENTIAL/PLATELET
Abs Immature Granulocytes: 0.09 10*3/uL — ABNORMAL HIGH (ref 0.00–0.07)
Basophils Absolute: 0.1 10*3/uL (ref 0.0–0.1)
Basophils Relative: 1 %
Eosinophils Absolute: 0.4 10*3/uL (ref 0.0–0.5)
Eosinophils Relative: 2 %
HCT: 27.2 % — ABNORMAL LOW (ref 39.0–52.0)
Hemoglobin: 8.8 g/dL — ABNORMAL LOW (ref 13.0–17.0)
Immature Granulocytes: 1 %
Lymphocytes Relative: 40 %
Lymphs Abs: 6.7 10*3/uL — ABNORMAL HIGH (ref 0.7–4.0)
MCH: 29 pg (ref 26.0–34.0)
MCHC: 32.4 g/dL (ref 30.0–36.0)
MCV: 89.8 fL (ref 80.0–100.0)
Monocytes Absolute: 1.4 10*3/uL — ABNORMAL HIGH (ref 0.1–1.0)
Monocytes Relative: 8 %
Neutro Abs: 8.2 10*3/uL — ABNORMAL HIGH (ref 1.7–7.7)
Neutrophils Relative %: 48 %
Platelets: 210 10*3/uL (ref 150–400)
RBC: 3.03 MIL/uL — ABNORMAL LOW (ref 4.22–5.81)
RDW: 17.1 % — ABNORMAL HIGH (ref 11.5–15.5)
WBC: 16.9 10*3/uL — ABNORMAL HIGH (ref 4.0–10.5)
nRBC: 0 % (ref 0.0–0.2)

## 2022-04-17 LAB — BASIC METABOLIC PANEL
Anion gap: 8 (ref 5–15)
BUN: 22 mg/dL (ref 8–23)
CO2: 22 mmol/L (ref 22–32)
Calcium: 8.8 mg/dL — ABNORMAL LOW (ref 8.9–10.3)
Chloride: 113 mmol/L — ABNORMAL HIGH (ref 98–111)
Creatinine, Ser: 1.34 mg/dL — ABNORMAL HIGH (ref 0.61–1.24)
GFR, Estimated: 51 mL/min — ABNORMAL LOW (ref >60)
Glucose, Bld: 130 mg/dL — ABNORMAL HIGH (ref 70–99)
Potassium: 4.5 mmol/L (ref 3.5–5.1)
Sodium: 143 mmol/L (ref 135–145)

## 2022-04-17 LAB — MAGNESIUM: Magnesium: 2.6 mg/dL — ABNORMAL HIGH (ref 1.7–2.4)

## 2022-04-17 MED ORDER — HYDROMORPHONE HCL 1 MG/ML IJ SOLN
0.5000 mg | INTRAMUSCULAR | Status: DC | PRN
  Administered 2022-04-17 – 2022-04-20 (×8): 0.5 mg via INTRAVENOUS
  Filled 2022-04-17 (×8): qty 0.5

## 2022-04-17 MED ORDER — LACTATED RINGERS IV SOLN: INTRAVENOUS | Status: DC

## 2022-04-17 NOTE — Progress Notes (Signed)
PT Cancellation Note  Patient Details Name: Nieves Barberi MRN: [id number] DOB: [birth year]   Cancelled Treatment:    Reason Eval/Treat Not Completed: Fatigue/lethargy limiting ability to participate; OT eval noted.  Will wait till later in the day for assessment in hopes pt more arousable.    Reginia Naas 04/17/2022, 10:56 AM Magda Kiel, PT [facility] [phone] [phone] 04/17/2022

## 2022-04-17 NOTE — Progress Notes (Signed)
OT Cancellation Note  Patient Details Name: Donald [name] MRN: [id number] DOB: 03-06-1933   Cancelled Treatment:    Reason Eval/Treat Not Completed: Fatigue/lethargy limiting ability to participate (Per RN pt received ativan last night and is still sleeping; OT evaluation to f/u once LOA improves)  Donald [name] 04/17/2022, 8:31 AM

## 2022-04-17 NOTE — Assessment & Plan Note (Addendum)
Recent Labs    07/29/21 0958 07/30/21 0526 07/31/21 0510 08/01/21 0527 04/15/22 1222 04/15/22 1231 04/16/22 0431 04/17/22 0113 04/18/22 0350 04/19/22 0400  HGB 11.3* 10.1* 10.3* 10.4* 10.9* 11.2* 8.2* 8.8* 8.8* 9.3*  Slight drop in Hgb likely dilutional and blood loss from laceration and hematoma.  Relatively stable.  Anemia panel with iron deficiency. -Received IV ferric gluconate 250 mg x 1 -Continue monitoring

## 2022-04-17 NOTE — Assessment & Plan Note (Signed)
Resolved

## 2022-04-17 NOTE — Assessment & Plan Note (Addendum)
Recent Labs    07/29/21 0958 07/30/21 0526 07/31/21 0510 08/01/21 0527 04/15/22 1222 04/15/22 1231 04/16/22 0431 04/17/22 0113 04/18/22 0350 04/19/22 0400  BUN 33* 37* 42* 39* 21 24* 19 22 19 18   CREATININE 1.73* 1.68* 1.35* 1.26* 1.50* 1.60* 1.22 1.34* 1.15 1.17  AKI resolved. -Continue gentle IV fluid

## 2022-04-17 NOTE — Assessment & Plan Note (Addendum)
CK started to trend down. -Continue IV fluid -continue monitoring

## 2022-04-17 NOTE — Assessment & Plan Note (Signed)
Body mass index is 31.93 kg/m.
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
## 2022-04-17 NOTE — Assessment & Plan Note (Signed)
Stable.  Continue home inhalers and as needed nebulizers

## 2022-04-17 NOTE — Assessment & Plan Note (Addendum)
Patient has history of CLL.  Could have some demargination as well.
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
## 2022-04-17 NOTE — Assessment & Plan Note (Signed)
-   Outpatient follow-up °

## 2022-04-17 NOTE — Assessment & Plan Note (Addendum)
Rate controlled.  On metoprolol and Eliquis at home. -Coreg 6.25 mg twice daily given elevated blood pressure. -Hold Eliquis in the setting of hematoma

## 2022-04-17 NOTE — Assessment & Plan Note (Addendum)
He is more awake today.  He is oriented to self and person but not place or time.  Follows commands. -Reorientation and delirium precaution -Avoid benzodiazepines -IV Haldol as needed -Fall precaution
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
## 2022-04-18 DIAGNOSIS — C9110 Chronic lymphocytic leukemia of B-cell type not having achieved remission: Secondary | ICD-10-CM | POA: Diagnosis not present

## 2022-04-18 DIAGNOSIS — G9340 Encephalopathy, unspecified: Secondary | ICD-10-CM | POA: Diagnosis not present

## 2022-04-18 DIAGNOSIS — S7011XD Contusion of right thigh, subsequent encounter: Secondary | ICD-10-CM | POA: Diagnosis not present

## 2022-04-18 LAB — COMPREHENSIVE METABOLIC PANEL
ALT: 29 U/L (ref 0–44)
AST: 77 U/L — ABNORMAL HIGH (ref 15–41)
Albumin: 3.4 g/dL — ABNORMAL LOW (ref 3.5–5.0)
Alkaline Phosphatase: 63 U/L (ref 38–126)
Anion gap: 10 (ref 5–15)
BUN: 19 mg/dL (ref 8–23)
CO2: 21 mmol/L — ABNORMAL LOW (ref 22–32)
Calcium: 8.9 mg/dL (ref 8.9–10.3)
Chloride: 111 mmol/L (ref 98–111)
Creatinine, Ser: 1.15 mg/dL (ref 0.61–1.24)
GFR, Estimated: >60 mL/min (ref >60)
Glucose, Bld: 115 mg/dL — ABNORMAL HIGH (ref 70–99)
Potassium: 3.8 mmol/L (ref 3.5–5.1)
Sodium: 142 mmol/L (ref 135–145)
Total Bilirubin: 1.2 mg/dL (ref 0.3–1.2)
Total Protein: 6.2 g/dL — ABNORMAL LOW (ref 6.5–8.1)

## 2022-04-18 LAB — IRON AND TIBC
Iron: 26 ug/dL — ABNORMAL LOW (ref 45–182)
Saturation Ratios: 7 % — ABNORMAL LOW (ref 17.9–39.5)
TIBC: 349 ug/dL (ref 250–450)
UIBC: 323 ug/dL

## 2022-04-18 LAB — CBC WITH DIFFERENTIAL/PLATELET
Abs Immature Granulocytes: 0.07 10*3/uL (ref 0.00–0.07)
Basophils Absolute: 0.1 10*3/uL (ref 0.0–0.1)
Basophils Relative: 1 %
Eosinophils Absolute: 0.5 10*3/uL (ref 0.0–0.5)
Eosinophils Relative: 3 %
HCT: 27.5 % — ABNORMAL LOW (ref 39.0–52.0)
Hemoglobin: 8.8 g/dL — ABNORMAL LOW (ref 13.0–17.0)
Immature Granulocytes: 1 %
Lymphocytes Relative: 35 %
Lymphs Abs: 5.3 10*3/uL — ABNORMAL HIGH (ref 0.7–4.0)
MCH: 28.5 pg (ref 26.0–34.0)
MCHC: 32 g/dL (ref 30.0–36.0)
MCV: 89 fL (ref 80.0–100.0)
Monocytes Absolute: 0.9 10*3/uL (ref 0.1–1.0)
Monocytes Relative: 6 %
Neutro Abs: 8.5 10*3/uL — ABNORMAL HIGH (ref 1.7–7.7)
Neutrophils Relative %: 54 %
Platelets: 229 10*3/uL (ref 150–400)
RBC: 3.09 MIL/uL — ABNORMAL LOW (ref 4.22–5.81)
RDW: 17 % — ABNORMAL HIGH (ref 11.5–15.5)
WBC: 15.4 10*3/uL — ABNORMAL HIGH (ref 4.0–10.5)
nRBC: 0.1 % (ref 0.0–0.2)

## 2022-04-18 LAB — AMMONIA: Ammonia: 26 umol/L (ref 9–35)

## 2022-04-18 LAB — MAGNESIUM: Magnesium: 2.2 mg/dL (ref 1.7–2.4)

## 2022-04-18 LAB — PHOSPHORUS: Phosphorus: 3.5 mg/dL (ref 2.5–4.6)

## 2022-04-18 LAB — VITAMIN B12: Vitamin B-12: 362 pg/mL (ref 180–914)

## 2022-04-18 LAB — TSH: TSH: 3.256 u[IU]/mL (ref 0.350–4.500)

## 2022-04-18 LAB — FOLATE: Folate: 24.7 ng/mL (ref >5.9)

## 2022-04-18 LAB — RETICULOCYTES
Immature Retic Fract: 32 % — ABNORMAL HIGH (ref 2.3–15.9)
RBC.: 3.01 MIL/uL — ABNORMAL LOW (ref 4.22–5.81)
Retic Count, Absolute: 101.7 10*3/uL (ref 19.0–186.0)
Retic Ct Pct: 3.4 % — ABNORMAL HIGH (ref 0.4–3.1)

## 2022-04-18 LAB — HEMOGLOBIN A1C
Hgb A1c MFr Bld: 6.5 % — ABNORMAL HIGH (ref 4.8–5.6)
Mean Plasma Glucose: 139.85 mg/dL

## 2022-04-18 LAB — FERRITIN: Ferritin: 68 ng/mL (ref 24–336)

## 2022-04-18 LAB — CK: Total CK: 1802 U/L — ABNORMAL HIGH (ref 49–397)

## 2022-04-18 MED ORDER — ACETAMINOPHEN 325 MG PO TABS
650.0000 mg | ORAL_TABLET | Freq: Four times a day (QID) | ORAL | Status: DC
  Administered 2022-04-18 – 2022-04-25 (×23): 650 mg via ORAL
  Filled 2022-04-18 (×24): qty 2

## 2022-04-18 MED ORDER — SODIUM CHLORIDE 0.9 % IV SOLN
250.0000 mg | Freq: Once | INTRAVENOUS | Status: AC
  Administered 2022-04-18: 250 mg via INTRAVENOUS
  Filled 2022-04-18: qty 20
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
## 2022-04-19 DIAGNOSIS — S7011XD Contusion of right thigh, subsequent encounter: Secondary | ICD-10-CM | POA: Diagnosis not present

## 2022-04-19 DIAGNOSIS — C9110 Chronic lymphocytic leukemia of B-cell type not having achieved remission: Secondary | ICD-10-CM | POA: Diagnosis not present

## 2022-04-19 DIAGNOSIS — G9340 Encephalopathy, unspecified: Secondary | ICD-10-CM | POA: Diagnosis not present

## 2022-04-19 LAB — COMPREHENSIVE METABOLIC PANEL
ALT: 34 U/L (ref 0–44)
AST: 74 U/L — ABNORMAL HIGH (ref 15–41)
Albumin: 3.3 g/dL — ABNORMAL LOW (ref 3.5–5.0)
Alkaline Phosphatase: 63 U/L (ref 38–126)
Anion gap: 11 (ref 5–15)
BUN: 18 mg/dL (ref 8–23)
CO2: 19 mmol/L — ABNORMAL LOW (ref 22–32)
Calcium: 8.8 mg/dL — ABNORMAL LOW (ref 8.9–10.3)
Chloride: 109 mmol/L (ref 98–111)
Creatinine, Ser: 1.17 mg/dL (ref 0.61–1.24)
GFR, Estimated: >60 mL/min (ref >60)
Glucose, Bld: 107 mg/dL — ABNORMAL HIGH (ref 70–99)
Potassium: 3.9 mmol/L (ref 3.5–5.1)
Sodium: 139 mmol/L (ref 135–145)
Total Bilirubin: 1.5 mg/dL — ABNORMAL HIGH (ref 0.3–1.2)
Total Protein: 6 g/dL — ABNORMAL LOW (ref 6.5–8.1)

## 2022-04-19 LAB — CBC
HCT: 28.9 % — ABNORMAL LOW (ref 39.0–52.0)
Hemoglobin: 9.3 g/dL — ABNORMAL LOW (ref 13.0–17.0)
MCH: 28.3 pg (ref 26.0–34.0)
MCHC: 32.2 g/dL (ref 30.0–36.0)
MCV: 87.8 fL (ref 80.0–100.0)
Platelets: 257 10*3/uL (ref 150–400)
RBC: 3.29 MIL/uL — ABNORMAL LOW (ref 4.22–5.81)
RDW: 16.5 % — ABNORMAL HIGH (ref 11.5–15.5)
WBC: 16.5 10*3/uL — ABNORMAL HIGH (ref 4.0–10.5)
nRBC: 0.2 % (ref 0.0–0.2)

## 2022-04-19 LAB — CK: Total CK: 1181 U/L — ABNORMAL HIGH (ref 49–397)

## 2022-04-19 LAB — PHOSPHORUS: Phosphorus: 4 mg/dL (ref 2.5–4.6)

## 2022-04-19 LAB — MAGNESIUM: Magnesium: 2.1 mg/dL (ref 1.7–2.4)

## 2022-04-19 MED ORDER — POLYETHYLENE GLYCOL 3350 17 G PO PACK
17.0000 g | PACK | Freq: Two times a day (BID) | ORAL | Status: DC | PRN
  Administered 2022-04-22: 17 g via ORAL
  Filled 2022-04-19: qty 1

## 2022-04-19 MED ORDER — SENNOSIDES-DOCUSATE SODIUM 8.6-50 MG PO TABS
1.0000 | ORAL_TABLET | Freq: Two times a day (BID) | ORAL | Status: DC | PRN
  Administered 2022-04-19 – 2022-04-23 (×5): 1 via ORAL
  Filled 2022-04-19 (×5): qty 1

## 2022-04-19 MED ORDER — CARVEDILOL 6.25 MG PO TABS
6.2500 mg | ORAL_TABLET | Freq: Two times a day (BID) | ORAL | Status: DC
  Administered 2022-04-19 – 2022-04-22 (×8): 6.25 mg via ORAL
  Filled 2022-04-19 (×8): qty 1

## 2022-04-19 NOTE — TOC Progression Note (Signed)
Transition of Care [facility]) - Progression Note    Patient Details  Name: Donald [name] MRN: [id number] Date of Birth: May 06, 1933  [facility] CM/SW [place], [place] Phone Number: 04/19/2022, 10:24 AM  Clinical Narrative:     Patient has no bed offers at this time.   TOC will continue to follow and assist with discharge planning.  Thurmond Butts, MSW, LCSW Clinical Social Worker    Expected Discharge Plan: Skilled Nursing Facility Barriers to Discharge: Continued Medical Work up, Ship broker, SNF Pending bed offer  Expected Discharge Plan and Services Expected Discharge Plan: [place] In-house Referral: Clinical Social Work                                             Social Determinants of Health (SDOH) Interventions    Readmission Risk Interventions     No data to display
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
## 2022-04-19 NOTE — Care Management Important Message (Signed)
Important Message  Patient Details  Name: Donald [name] MRN: [id number] Date of Birth: Nov 24, 1932   Medicare Important Message Given:  Yes     Orbie Pyo 04/19/2022, 2:08 PM
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
## 2022-04-20 DIAGNOSIS — C9110 Chronic lymphocytic leukemia of B-cell type not having achieved remission: Secondary | ICD-10-CM | POA: Diagnosis not present

## 2022-04-20 DIAGNOSIS — R410 Disorientation, unspecified: Secondary | ICD-10-CM

## 2022-04-20 DIAGNOSIS — S7011XD Contusion of right thigh, subsequent encounter: Secondary | ICD-10-CM | POA: Diagnosis not present

## 2022-04-20 DIAGNOSIS — G9340 Encephalopathy, unspecified: Secondary | ICD-10-CM | POA: Diagnosis not present

## 2022-04-20 LAB — COMPREHENSIVE METABOLIC PANEL
ALT: 36 U/L (ref 0–44)
AST: 56 U/L — ABNORMAL HIGH (ref 15–41)
Albumin: 3.1 g/dL — ABNORMAL LOW (ref 3.5–5.0)
Alkaline Phosphatase: 65 U/L (ref 38–126)
Anion gap: 11 (ref 5–15)
BUN: 17 mg/dL (ref 8–23)
CO2: 21 mmol/L — ABNORMAL LOW (ref 22–32)
Calcium: 9 mg/dL (ref 8.9–10.3)
Chloride: 107 mmol/L (ref 98–111)
Creatinine, Ser: 1.1 mg/dL (ref 0.61–1.24)
GFR, Estimated: >60 mL/min (ref >60)
Glucose, Bld: 110 mg/dL — ABNORMAL HIGH (ref 70–99)
Potassium: 3.7 mmol/L (ref 3.5–5.1)
Sodium: 139 mmol/L (ref 135–145)
Total Bilirubin: 1.3 mg/dL — ABNORMAL HIGH (ref 0.3–1.2)
Total Protein: 5.8 g/dL — ABNORMAL LOW (ref 6.5–8.1)

## 2022-04-20 LAB — CBC
HCT: 28.3 % — ABNORMAL LOW (ref 39.0–52.0)
Hemoglobin: 9.3 g/dL — ABNORMAL LOW (ref 13.0–17.0)
MCH: 28.6 pg (ref 26.0–34.0)
MCHC: 32.9 g/dL (ref 30.0–36.0)
MCV: 87.1 fL (ref 80.0–100.0)
Platelets: 243 10*3/uL (ref 150–400)
RBC: 3.25 MIL/uL — ABNORMAL LOW (ref 4.22–5.81)
RDW: 16.6 % — ABNORMAL HIGH (ref 11.5–15.5)
WBC: 16.7 10*3/uL — ABNORMAL HIGH (ref 4.0–10.5)
nRBC: 0.1 % (ref 0.0–0.2)

## 2022-04-20 LAB — PHOSPHORUS: Phosphorus: 3.9 mg/dL (ref 2.5–4.6)

## 2022-04-20 LAB — MAGNESIUM: Magnesium: 2 mg/dL (ref 1.7–2.4)

## 2022-04-20 LAB — CK: Total CK: 643 U/L — ABNORMAL HIGH (ref 49–397)
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
## 2022-04-21 DIAGNOSIS — G9340 Encephalopathy, unspecified: Secondary | ICD-10-CM | POA: Diagnosis not present

## 2022-04-21 DIAGNOSIS — S7011XD Contusion of right thigh, subsequent encounter: Secondary | ICD-10-CM | POA: Diagnosis not present

## 2022-04-21 DIAGNOSIS — C9110 Chronic lymphocytic leukemia of B-cell type not having achieved remission: Secondary | ICD-10-CM | POA: Diagnosis not present

## 2022-04-21 LAB — RENAL FUNCTION PANEL
Albumin: 3.4 g/dL — ABNORMAL LOW (ref 3.5–5.0)
Anion gap: 11 (ref 5–15)
BUN: 17 mg/dL (ref 8–23)
CO2: 23 mmol/L (ref 22–32)
Calcium: 9.7 mg/dL (ref 8.9–10.3)
Chloride: 109 mmol/L (ref 98–111)
Creatinine, Ser: 1.15 mg/dL (ref 0.61–1.24)
GFR, Estimated: >60 mL/min (ref >60)
Glucose, Bld: 114 mg/dL — ABNORMAL HIGH (ref 70–99)
Phosphorus: 3.8 mg/dL (ref 2.5–4.6)
Potassium: 4.7 mmol/L (ref 3.5–5.1)
Sodium: 143 mmol/L (ref 135–145)

## 2022-04-21 LAB — CBC
HCT: 30.7 % — ABNORMAL LOW (ref 39.0–52.0)
Hemoglobin: 9.9 g/dL — ABNORMAL LOW (ref 13.0–17.0)
MCH: 28.9 pg (ref 26.0–34.0)
MCHC: 32.2 g/dL (ref 30.0–36.0)
MCV: 89.5 fL (ref 80.0–100.0)
Platelets: 271 10*3/uL (ref 150–400)
RBC: 3.43 MIL/uL — ABNORMAL LOW (ref 4.22–5.81)
RDW: 16.8 % — ABNORMAL HIGH (ref 11.5–15.5)
WBC: 17.3 10*3/uL — ABNORMAL HIGH (ref 4.0–10.5)
nRBC: 0.2 % (ref 0.0–0.2)

## 2022-04-21 LAB — MAGNESIUM: Magnesium: 2.1 mg/dL (ref 1.7–2.4)

## 2022-04-21 LAB — CK: Total CK: 545 U/L — ABNORMAL HIGH (ref 49–397)

## 2022-04-21 MED ORDER — SODIUM CHLORIDE 0.9 % IV SOLN
250.0000 mg | Freq: Once | INTRAVENOUS | Status: AC
  Administered 2022-04-21: 250 mg via INTRAVENOUS
  Filled 2022-04-21: qty 20
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
## 2022-04-22 ENCOUNTER — Inpatient Hospital Stay (HOSPITAL_COMMUNITY): Payer: Medicare PPO

## 2022-04-22 DIAGNOSIS — G9340 Encephalopathy, unspecified: Secondary | ICD-10-CM | POA: Diagnosis not present

## 2022-04-22 DIAGNOSIS — C9110 Chronic lymphocytic leukemia of B-cell type not having achieved remission: Secondary | ICD-10-CM | POA: Diagnosis not present

## 2022-04-22 DIAGNOSIS — R1314 Dysphagia, pharyngoesophageal phase: Secondary | ICD-10-CM

## 2022-04-22 DIAGNOSIS — S7011XD Contusion of right thigh, subsequent encounter: Secondary | ICD-10-CM | POA: Diagnosis not present

## 2022-04-22 LAB — CBC
HCT: 32 % — ABNORMAL LOW (ref 39.0–52.0)
Hemoglobin: 10.3 g/dL — ABNORMAL LOW (ref 13.0–17.0)
MCH: 28.4 pg (ref 26.0–34.0)
MCHC: 32.2 g/dL (ref 30.0–36.0)
MCV: 88.2 fL (ref 80.0–100.0)
Platelets: 280 10*3/uL (ref 150–400)
RBC: 3.63 MIL/uL — ABNORMAL LOW (ref 4.22–5.81)
RDW: 17.7 % — ABNORMAL HIGH (ref 11.5–15.5)
WBC: 17.6 10*3/uL — ABNORMAL HIGH (ref 4.0–10.5)
nRBC: 0.2 % (ref 0.0–0.2)

## 2022-04-22 LAB — RENAL FUNCTION PANEL
Albumin: 3.3 g/dL — ABNORMAL LOW (ref 3.5–5.0)
Anion gap: 11 (ref 5–15)
BUN: 16 mg/dL (ref 8–23)
CO2: 22 mmol/L (ref 22–32)
Calcium: 9.3 mg/dL (ref 8.9–10.3)
Chloride: 109 mmol/L (ref 98–111)
Creatinine, Ser: 1.08 mg/dL (ref 0.61–1.24)
GFR, Estimated: >60 mL/min (ref >60)
Glucose, Bld: 121 mg/dL — ABNORMAL HIGH (ref 70–99)
Phosphorus: 3.1 mg/dL (ref 2.5–4.6)
Potassium: 3.4 mmol/L — ABNORMAL LOW (ref 3.5–5.1)
Sodium: 142 mmol/L (ref 135–145)

## 2022-04-22 LAB — CK: Total CK: 358 U/L (ref 49–397)

## 2022-04-22 LAB — MAGNESIUM: Magnesium: 2.1 mg/dL (ref 1.7–2.4)

## 2022-04-22 MED ORDER — POTASSIUM CHLORIDE CRYS ER 20 MEQ PO TBCR
40.0000 meq | EXTENDED_RELEASE_TABLET | Freq: Once | ORAL | Status: AC
  Administered 2022-04-22: 40 meq via ORAL
  Filled 2022-04-22: qty 2

## 2022-04-22 MED ORDER — MUPIROCIN 2 % EX OINT
TOPICAL_OINTMENT | Freq: Every day | CUTANEOUS | Status: DC
  Filled 2022-04-22: qty 22
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
## 2022-04-22 NOTE — Progress Notes (Signed)
Modified Barium Swallow Progress Note  Patient Details  Name: Donald [name] MRN: [id number] Date of Birth: [birth year]  Today's Date: 04/22/2022  Modified Barium Swallow completed.  Full report located under Chart Review in the Imaging Section.  Brief recommendations include the following:  Clinical Impression  Pt has a moderate oropharyngeal dysphagia, although much more alert for MBS this afternoon. Boluses spill into the anterior sulcus and he has reduced lingual control, resulting in overall reducd bolus cohesion and lingual residue. He did not masticate a cracker at all, despite attempts to soften with purees, ultimately spitting it back out whole. He has pharyngeal weakness that includes reduced pharyngeal squeeze and hyolaryngeal movement. Residue is in the valleculae, lateral channels, and pyriform sinuses. Liquid boluses also enter the airway before the swallow due to delays in initiation. Aspiration is silent with thin liquids. Nectar thick liquids and honey thick via straw are frankly penetrated, although it is difficult at times to get a clear view of the vocal folds and subglottis due to a shadow from pt's shoulder. Even if not aspirated on MBS, could be likely to occur across a meal especially when consdiering his fluctuating mentation. Recommend Dys 1 diet and honey thick liquids by cup only.   Swallow Evaluation Recommendations       SLP Diet Recommendations: Dysphagia 1 (Puree) solids;Honey thick liquids   Liquid Administration via: Cup;No straw   Medication Administration: Crushed with puree   Supervision: Staff to assist with self feeding;Full supervision/cueing for compensatory strategies   Compensations: Minimize environmental distractions;Slow rate;Small sips/bites;Clear throat intermittently   Postural Changes: Remain semi-upright after after feeds/meals (Comment);Seated upright at 90 degrees   Oral Care Recommendations: Oral care BID        Osie Bond.,  M.A. [place] Office [phone]  Secure chat preferred  04/22/2022,3:26 PM

## 2022-04-22 NOTE — Progress Notes (Signed)
PT Cancellation Note  Patient Details Name: Donald [name] MRN: [id number] DOB: [birth year]   Cancelled Treatment:    Reason Eval/Treat Not Completed: Patient at procedure or test/unavailable (off floor). Will check back as time allows.   Leighton Roach, [place]  Pager [phone] Office [place] 04/22/2022, 1:51 PM
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
## 2022-04-23 DIAGNOSIS — C9110 Chronic lymphocytic leukemia of B-cell type not having achieved remission: Secondary | ICD-10-CM | POA: Diagnosis not present

## 2022-04-23 DIAGNOSIS — G9340 Encephalopathy, unspecified: Secondary | ICD-10-CM | POA: Diagnosis not present

## 2022-04-23 DIAGNOSIS — S7011XD Contusion of right thigh, subsequent encounter: Secondary | ICD-10-CM | POA: Diagnosis not present

## 2022-04-23 LAB — RENAL FUNCTION PANEL
Albumin: 3.4 g/dL — ABNORMAL LOW (ref 3.5–5.0)
Anion gap: 11 (ref 5–15)
BUN: 18 mg/dL (ref 8–23)
CO2: 19 mmol/L — ABNORMAL LOW (ref 22–32)
Calcium: 9.3 mg/dL (ref 8.9–10.3)
Chloride: 112 mmol/L — ABNORMAL HIGH (ref 98–111)
Creatinine, Ser: 1.21 mg/dL (ref 0.61–1.24)
GFR, Estimated: 58 mL/min — ABNORMAL LOW (ref >60)
Glucose, Bld: 102 mg/dL — ABNORMAL HIGH (ref 70–99)
Phosphorus: 3.8 mg/dL (ref 2.5–4.6)
Potassium: 4.3 mmol/L (ref 3.5–5.1)
Sodium: 142 mmol/L (ref 135–145)

## 2022-04-23 LAB — MAGNESIUM: Magnesium: 2.1 mg/dL (ref 1.7–2.4)

## 2022-04-23 MED ORDER — SENNOSIDES-DOCUSATE SODIUM 8.6-50 MG PO TABS
1.0000 | ORAL_TABLET | Freq: Two times a day (BID) | ORAL | Status: DC
  Administered 2022-04-23 – 2022-04-25 (×4): 1 via ORAL
  Filled 2022-04-23 (×4): qty 1

## 2022-04-23 MED ORDER — CARVEDILOL 12.5 MG PO TABS
12.5000 mg | ORAL_TABLET | Freq: Two times a day (BID) | ORAL | Status: DC
  Administered 2022-04-23 – 2022-04-25 (×5): 12.5 mg via ORAL
  Filled 2022-04-23 (×5): qty 1

## 2022-04-23 MED ORDER — APIXABAN 2.5 MG PO TABS
2.5000 mg | ORAL_TABLET | Freq: Two times a day (BID) | ORAL | Status: DC
  Administered 2022-04-23 – 2022-04-25 (×5): 2.5 mg via ORAL
  Filled 2022-04-23 (×5): qty 1

## 2022-04-23 MED ORDER — POLYETHYLENE GLYCOL 3350 17 G PO PACK
17.0000 g | PACK | Freq: Two times a day (BID) | ORAL | Status: DC
  Administered 2022-04-23 – 2022-04-24 (×2): 17 g via ORAL
  Filled 2022-04-23 (×2): qty 1
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
## 2022-04-23 NOTE — TOC Progression Note (Signed)
Transition of Care [facility]) - Progression Note    Patient Details  Name: Donald [name] MRN: [id number] Date of Birth: 1933/10/30  Transition of Care [facility]) CM/SW [place], [place] Phone Number: 04/23/2022, 12:42 PM  Clinical Narrative:     CSW received call  back form [facility] [place] Steva Ready- she left voice message, patient is non service connected and would not be eligible for short term rehab through the [place].   CSW called patient's daughter provided update- [facility] and no other bed offer other then [place]. Family is agreeable to [place] for now.   TOC will continue to follow and assist with discharge planning.   Expected Discharge Plan: [place] Barriers to Discharge: Continued Medical Work up, Ship broker, SNF Pending bed offer  Expected Discharge Plan and Services Expected Discharge Plan: [place] In-house Referral: Clinical Social Work                                             Social Determinants of Health (SDOH) Interventions    Readmission Risk Interventions     No data to display
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
## 2022-04-24 DIAGNOSIS — S7011XD Contusion of right thigh, subsequent encounter: Secondary | ICD-10-CM | POA: Diagnosis not present

## 2022-04-24 DIAGNOSIS — C9110 Chronic lymphocytic leukemia of B-cell type not having achieved remission: Secondary | ICD-10-CM | POA: Diagnosis not present

## 2022-04-24 DIAGNOSIS — G9340 Encephalopathy, unspecified: Secondary | ICD-10-CM | POA: Diagnosis not present

## 2022-04-24 LAB — CBC
HCT: 31.7 % — ABNORMAL LOW (ref 39.0–52.0)
Hemoglobin: 10 g/dL — ABNORMAL LOW (ref 13.0–17.0)
MCH: 29.2 pg (ref 26.0–34.0)
MCHC: 31.5 g/dL (ref 30.0–36.0)
MCV: 92.4 fL (ref 80.0–100.0)
Platelets: 264 10*3/uL (ref 150–400)
RBC: 3.43 MIL/uL — ABNORMAL LOW (ref 4.22–5.81)
RDW: 18.6 % — ABNORMAL HIGH (ref 11.5–15.5)
WBC: 16.7 10*3/uL — ABNORMAL HIGH (ref 4.0–10.5)
nRBC: 0.1 % (ref 0.0–0.2)

## 2022-04-24 LAB — RENAL FUNCTION PANEL
Albumin: 3.4 g/dL — ABNORMAL LOW (ref 3.5–5.0)
Anion gap: 8 (ref 5–15)
BUN: 22 mg/dL (ref 8–23)
CO2: 21 mmol/L — ABNORMAL LOW (ref 22–32)
Calcium: 9.1 mg/dL (ref 8.9–10.3)
Chloride: 112 mmol/L — ABNORMAL HIGH (ref 98–111)
Creatinine, Ser: 1.53 mg/dL — ABNORMAL HIGH (ref 0.61–1.24)
GFR, Estimated: 44 mL/min — ABNORMAL LOW (ref >60)
Glucose, Bld: 110 mg/dL — ABNORMAL HIGH (ref 70–99)
Phosphorus: 4.8 mg/dL — ABNORMAL HIGH (ref 2.5–4.6)
Potassium: 3.9 mmol/L (ref 3.5–5.1)
Sodium: 141 mmol/L (ref 135–145)

## 2022-04-24 LAB — CK: Total CK: 193 U/L (ref 49–397)

## 2022-04-24 LAB — MAGNESIUM: Magnesium: 2.2 mg/dL (ref 1.7–2.4)

## 2022-04-24 MED ORDER — ORAL CARE MOUTH RINSE
15.0000 mL | OROMUCOSAL | Status: DC
  Administered 2022-04-25 (×2): 15 mL via OROMUCOSAL

## 2022-04-24 MED ORDER — HYDRALAZINE HCL 25 MG PO TABS: 25.0000 mg | ORAL_TABLET | Freq: Four times a day (QID) | ORAL | Status: DC | PRN

## 2022-04-24 MED ORDER — AMLODIPINE BESYLATE 5 MG PO TABS
5.0000 mg | ORAL_TABLET | Freq: Every day | ORAL | Status: DC
  Administered 2022-04-24 – 2022-04-25 (×2): 5 mg via ORAL
  Filled 2022-04-24 (×2): qty 1

## 2022-04-24 MED ORDER — ORAL CARE MOUTH RINSE: 15.0000 mL | OROMUCOSAL | Status: DC | PRN

## 2022-04-24 MED ORDER — LACTATED RINGERS IV BOLUS
1000.0000 mL | Freq: Once | INTRAVENOUS | Status: AC
Start: 2022-04-24 — End: 2022-04-24
  Administered 2022-04-24: 1000 mL via INTRAVENOUS
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
## 2022-04-24 NOTE — TOC Progression Note (Signed)
Transition of Care [facility]) - Progression Note    Patient Details  Name: Doc Mandala MRN: [id number] Date of Birth: 10-Apr-1933  Transition of Care [facility]) CM/SW [place], [place] Phone Number: 04/24/2022, 1:13 PM  Clinical Narrative:     Per chart review/MD- SNF in the next 24 hours if creatinine better  Received insurance authorization # [id number] from 06/14-06/16  Informed patient's daughter of insurance approval for SNF.   TOC will continue to follow and assist with discharge planning.  Expected Discharge Plan: [place] Barriers to Discharge: Continued Medical Work up, Ship broker, SNF Pending bed offer  Expected Discharge Plan and Services Expected Discharge Plan: [place] In-house Referral: Clinical Social Work                                             Social Determinants of Health (SDOH) Interventions    Readmission Risk Interventions     No data to display

## 2022-04-25 DIAGNOSIS — R911 Solitary pulmonary nodule: Secondary | ICD-10-CM | POA: Diagnosis not present

## 2022-04-25 DIAGNOSIS — I160 Hypertensive urgency: Secondary | ICD-10-CM | POA: Diagnosis not present

## 2022-04-25 DIAGNOSIS — D62 Acute posthemorrhagic anemia: Secondary | ICD-10-CM | POA: Diagnosis not present

## 2022-04-25 DIAGNOSIS — E878 Other disorders of electrolyte and fluid balance, not elsewhere classified: Secondary | ICD-10-CM | POA: Diagnosis not present

## 2022-04-25 DIAGNOSIS — N179 Acute kidney failure, unspecified: Secondary | ICD-10-CM | POA: Diagnosis not present

## 2022-04-25 DIAGNOSIS — Z7401 Bed confinement status: Secondary | ICD-10-CM | POA: Diagnosis not present

## 2022-04-25 DIAGNOSIS — R1314 Dysphagia, pharyngoesophageal phase: Secondary | ICD-10-CM | POA: Diagnosis not present

## 2022-04-25 DIAGNOSIS — I482 Chronic atrial fibrillation, unspecified: Secondary | ICD-10-CM | POA: Diagnosis not present

## 2022-04-25 DIAGNOSIS — D72829 Elevated white blood cell count, unspecified: Secondary | ICD-10-CM | POA: Diagnosis not present

## 2022-04-25 DIAGNOSIS — T07XXXA Unspecified multiple injuries, initial encounter: Secondary | ICD-10-CM | POA: Diagnosis not present

## 2022-04-25 DIAGNOSIS — I959 Hypotension, unspecified: Secondary | ICD-10-CM | POA: Diagnosis not present

## 2022-04-25 DIAGNOSIS — R918 Other nonspecific abnormal finding of lung field: Secondary | ICD-10-CM | POA: Diagnosis not present

## 2022-04-25 DIAGNOSIS — D509 Iron deficiency anemia, unspecified: Secondary | ICD-10-CM | POA: Diagnosis not present

## 2022-04-25 DIAGNOSIS — N1832 Chronic kidney disease, stage 3b: Secondary | ICD-10-CM | POA: Diagnosis not present

## 2022-04-25 DIAGNOSIS — Z23 Encounter for immunization: Secondary | ICD-10-CM | POA: Diagnosis present

## 2022-04-25 DIAGNOSIS — J449 Chronic obstructive pulmonary disease, unspecified: Secondary | ICD-10-CM | POA: Diagnosis not present

## 2022-04-25 DIAGNOSIS — C9110 Chronic lymphocytic leukemia of B-cell type not having achieved remission: Secondary | ICD-10-CM | POA: Diagnosis not present

## 2022-04-25 DIAGNOSIS — Y92410 Unspecified street and highway as the place of occurrence of the external cause: Secondary | ICD-10-CM | POA: Diagnosis not present

## 2022-04-25 DIAGNOSIS — R269 Unspecified abnormalities of gait and mobility: Secondary | ICD-10-CM | POA: Diagnosis not present

## 2022-04-25 DIAGNOSIS — J9601 Acute respiratory failure with hypoxia: Secondary | ICD-10-CM | POA: Diagnosis not present

## 2022-04-25 DIAGNOSIS — R1310 Dysphagia, unspecified: Secondary | ICD-10-CM | POA: Diagnosis not present

## 2022-04-25 DIAGNOSIS — G9340 Encephalopathy, unspecified: Secondary | ICD-10-CM | POA: Diagnosis not present

## 2022-04-25 DIAGNOSIS — I10 Essential (primary) hypertension: Secondary | ICD-10-CM | POA: Diagnosis not present

## 2022-04-25 DIAGNOSIS — E119 Type 2 diabetes mellitus without complications: Secondary | ICD-10-CM | POA: Diagnosis not present

## 2022-04-25 DIAGNOSIS — T796XXA Traumatic ischemia of muscle, initial encounter: Secondary | ICD-10-CM | POA: Diagnosis not present

## 2022-04-25 LAB — RENAL FUNCTION PANEL
Albumin: 3.2 g/dL — ABNORMAL LOW (ref 3.5–5.0)
Anion gap: 7 (ref 5–15)
BUN: 25 mg/dL — ABNORMAL HIGH (ref 8–23)
CO2: 21 mmol/L — ABNORMAL LOW (ref 22–32)
Calcium: 8.8 mg/dL — ABNORMAL LOW (ref 8.9–10.3)
Chloride: 110 mmol/L (ref 98–111)
Creatinine, Ser: 1.4 mg/dL — ABNORMAL HIGH (ref 0.61–1.24)
GFR, Estimated: 49 mL/min — ABNORMAL LOW (ref >60)
Glucose, Bld: 112 mg/dL — ABNORMAL HIGH (ref 70–99)
Phosphorus: 4.5 mg/dL (ref 2.5–4.6)
Potassium: 4 mmol/L (ref 3.5–5.1)
Sodium: 138 mmol/L (ref 135–145)

## 2022-04-25 LAB — MAGNESIUM: Magnesium: 2.3 mg/dL (ref 1.7–2.4)

## 2022-04-25 MED ORDER — AMLODIPINE BESYLATE 5 MG PO TABS: 5.0000 mg | ORAL_TABLET | Freq: Every day | ORAL | Status: DC

## 2022-04-25 MED ORDER — FLEET ENEMA 7-19 GM/118ML RE ENEM: 1.0000 | ENEMA | Freq: Every day | RECTAL | 0 refills | Status: DC | PRN

## 2022-04-25 MED ORDER — SENNOSIDES-DOCUSATE SODIUM 8.6-50 MG PO TABS: 1.0000 | ORAL_TABLET | Freq: Two times a day (BID) | ORAL | 0 refills | Status: DC | PRN

## 2022-04-25 MED ORDER — ACETAMINOPHEN 325 MG PO TABS: 650.0000 mg | ORAL_TABLET | Freq: Four times a day (QID) | ORAL | Status: DC

## 2022-04-25 MED ORDER — HYDRALAZINE HCL 25 MG PO TABS: 25.0000 mg | ORAL_TABLET | Freq: Four times a day (QID) | ORAL | Status: DC | PRN

## 2022-04-25 MED ORDER — POLYETHYLENE GLYCOL 3350 17 G PO PACK: 17.0000 g | PACK | Freq: Two times a day (BID) | ORAL | 0 refills | Status: DC | PRN

## 2022-04-25 MED ORDER — CARVEDILOL 12.5 MG PO TABS: 12.5000 mg | ORAL_TABLET | Freq: Two times a day (BID) | ORAL | Status: DC

## 2022-04-25 NOTE — Progress Notes (Signed)
Pt discharged to [facility]. Report called to [place], Therapist, sports. Pt's PIV removed. All belongings packed, including pt's dentures. Pt's wife and daughter aware of discharge.   Justice Rocher, RN
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
## 2022-04-25 NOTE — Discharge Summary (Signed)
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 will remain  in effect (meaning this test can be used) for the duration of the COVID-19 declaration under Section 564(b)(1) of the Act, 21 U.S.C.section 360bbb-3(b)(1), unless the authorization is terminated  or revoked sooner.       Influenza A by PCR NEGATIVE NEGATIVE Final   Influenza B by PCR NEGATIVE NEGATIVE Final    Comment: (NOTE) The Xpert Xpress SARS-CoV-2/FLU/RSV plus assay is intended as an aid in the diagnosis of influenza from Nasopharyngeal swab specimens and should not be used as a sole basis for treatment. Nasal washings and aspirates are unacceptable for Xpert Xpress SARS-CoV-2/FLU/RSV testing.  Fact Sheet for Patients: EntrepreneurPulse.com.au  Fact Sheet for Healthcare Providers: IncredibleEmployment.be  This test is not yet approved or cleared by the  [place] FDA and has been authorized for detection and/or diagnosis of SARS-CoV-2 by FDA under an Emergency Use Authorization (EUA). This EUA will remain in effect (meaning this test can be used) for the duration of the COVID-19 declaration under Section 564(b)(1) of the Act, 21 U.S.C. section 360bbb-3(b)(1), unless the authorization is terminated or revoked.  Performed at [facility], [place] [address] [place], [place] [address]      Labs:  CBC: Recent Labs  Lab 04/19/22 0400 04/20/22 0932 04/21/22 0250 04/22/22 0622 04/24/22 0220  WBC 16.5* 16.7* 17.3* 17.6* 16.7*  HGB 9.3* 9.3* 9.9* 10.3* 10.0*  HCT 28.9* 28.3* 30.7* 32.0* 31.7*  MCV 87.8 87.1 89.5 88.2 92.4  PLT 257 243 271 280 264   BMP &GFR Recent Labs  Lab 04/21/22 0250 04/22/22 0622 04/23/22 0229 04/24/22 0220 04/25/22 0231  NA 143 142 142 141 138  K 4.7 3.4* 4.3 3.9 4.0  CL 109 109 112* 112* 110  CO2 23 22 19* 21* 21*  GLUCOSE 114* 121* 102* 110* 112*  BUN 17 16 18 22  25*  CREATININE 1.15 1.08 1.21 1.53* 1.40*  CALCIUM 9.7 9.3 9.3 9.1 8.8*  MG 2.1 2.1 2.1 2.2 2.3  PHOS 3.8 3.1 3.8 4.8* 4.5   Estimated Creatinine Clearance: 41.6 mL/min (A) (by C-G formula based on SCr of 1.4 mg/dL (H)). Liver &  Pancreas: Recent Labs  Lab 04/19/22 0400 04/20/22 0932 04/21/22 0250 04/22/22 0622 04/23/22 0229 04/24/22 0220 04/25/22 0231  AST 74* 56*  --   --   --   --   --   ALT 34 36  --   --   --   --   --   ALKPHOS 63 65  --   --   --   --   --   BILITOT 1.5* 1.3*  --   --   --   --   --   PROT 6.0* 5.8*  --   --   --   --   --   ALBUMIN 3.3* 3.1* 3.4* 3.3* 3.4* 3.4* 3.2*   No results for input(s): "LIPASE", "AMYLASE" in the last 168 hours. No results for input(s): "AMMONIA" in the last 168 hours. Diabetic: No results for input(s): "HGBA1C" in the last 72 hours. No results for input(s): "GLUCAP" in the last 168 hours. Cardiac Enzymes: Recent Labs  Lab 04/19/22 0400 04/20/22 0932  04/21/22 0250 04/22/22 0622 04/24/22 0220  CKTOTAL 1,181* 643* 545* 358 193   No results for input(s): "PROBNP" in the last 8760 hours. Coagulation Profile: No results for input(s): "INR", "PROTIME" in the last 168 hours. Thyroid Function Tests: No results for input(s): "TSH", "T4TOTAL", "FREET4", "T3FREE", "THYROIDAB" in the last 72 hours. Lipid Profile: No results for input(s): "CHOL", "HDL", "LDLCALC", "TRIG", "CHOLHDL", "LDLDIRECT" in the last 72 hours. Anemia Panel: No results for input(s): "VITAMINB12", "FOLATE", "FERRITIN", "TIBC", "IRON", "RETICCTPCT" in the last 72 hours. Urine analysis:    Component Value Date/Time   COLORURINE YELLOW 04/16/2022 0030   APPEARANCEUR HAZY (A) 04/16/2022 0030   APPEARANCEUR Clear 06/22/2020 1008   LABSPEC >1.046 (H) 04/16/2022 0030   PHURINE 5.0 04/16/2022 0030   GLUCOSEU NEGATIVE 04/16/2022 0030   HGBUR SMALL (A) 04/16/2022 0030   BILIRUBINUR NEGATIVE 04/16/2022 0030   BILIRUBINUR Negative 06/22/2020 1008   KETONESUR NEGATIVE 04/16/2022 0030   PROTEINUR 100 (A) 04/16/2022 0030   UROBILINOGEN 0.2 05/05/2013 1134   NITRITE NEGATIVE 04/16/2022 0030   LEUKOCYTESUR NEGATIVE 04/16/2022 0030   Sepsis Labs: Invalid input(s): "PROCALCITONIN", "LACTICIDVEN"   SIGNED:  Mercy Riding, MD  [facility] 04/25/2022, 11:34 AM

## 2022-04-25 NOTE — Progress Notes (Signed)
3 sutures removed from left side of pt's temple, 2 staples removed from pt's right knee, per order.   Justice Rocher, RN

## 2022-04-25 NOTE — TOC Transition Note (Signed)
Transition of Care [facility]) - CM/SW Discharge Note   Patient Details  Name: Donald [name] MRN: [id number] Date of Birth: [birth year]  [facility]) CM/SW Contact:  Vinie Sill, LCSW Phone Number: 04/25/2022, 12:50 PM   Clinical Narrative:     Patient will Discharge to: [facility] Discharge Date: 04/25/2022 Family Notified: daughter Transport By: Corey Harold  Per MD patient is ready for discharge. RN, patient, and facility notified of discharge. Discharge Summary sent to facility. RN given number for [phone]. Ambulance transport requested for patient.   Clinical Social Worker signing off.  Thurmond Butts, MSW, LCSW Clinical Social Worker     Final next level of care: Skilled Nursing Facility Barriers to Discharge: Barriers Resolved   Patient Goals and CMS Choice        Discharge Placement              Patient chooses bed at: [facility] Patient to be transferred to facility by: [place] Name of family member notified: daughter Patient and family notified of of transfer: 04/25/22  Discharge Plan and Services In-house Referral: Clinical Social Work                                   Social Determinants of Health ([place]) Interventions     Readmission Risk Interventions     No data to display

## 2022-04-29 DIAGNOSIS — R1310 Dysphagia, unspecified: Secondary | ICD-10-CM | POA: Diagnosis not present

## 2022-04-29 DIAGNOSIS — D509 Iron deficiency anemia, unspecified: Secondary | ICD-10-CM | POA: Diagnosis not present

## 2022-04-29 DIAGNOSIS — R269 Unspecified abnormalities of gait and mobility: Secondary | ICD-10-CM | POA: Diagnosis not present

## 2022-04-29 DIAGNOSIS — R911 Solitary pulmonary nodule: Secondary | ICD-10-CM | POA: Diagnosis not present

## 2022-04-29 DIAGNOSIS — C9110 Chronic lymphocytic leukemia of B-cell type not having achieved remission: Secondary | ICD-10-CM | POA: Diagnosis not present

## 2022-04-29 DIAGNOSIS — T796XXA Traumatic ischemia of muscle, initial encounter: Secondary | ICD-10-CM | POA: Diagnosis not present

## 2022-04-29 DIAGNOSIS — D62 Acute posthemorrhagic anemia: Secondary | ICD-10-CM | POA: Diagnosis not present

## 2022-04-29 DIAGNOSIS — T07XXXA Unspecified multiple injuries, initial encounter: Secondary | ICD-10-CM | POA: Diagnosis not present

## 2022-05-06 ENCOUNTER — Telehealth: Payer: Self-pay | Admitting: Cardiovascular Disease

## 2022-05-08 ENCOUNTER — Inpatient Hospital Stay: Payer: Medicare PPO | Admitting: Internal Medicine

## 2022-05-30 ENCOUNTER — Emergency Department
Admission: EM | Admit: 2022-05-30 | Discharge: 2022-05-30 | Disposition: A | Discharge status: Home / Self Care | Payer: No Typology Code available for payment source | Attending: Emergency Medicine | Admitting: Emergency Medicine

## 2022-05-30 ENCOUNTER — Emergency Department: Payer: No Typology Code available for payment source

## 2022-05-30 ENCOUNTER — Encounter: Payer: Self-pay | Admitting: Emergency Medicine

## 2022-05-30 ENCOUNTER — Other Ambulatory Visit: Payer: Self-pay

## 2022-05-30 DIAGNOSIS — F17200 Nicotine dependence, unspecified, uncomplicated: Secondary | ICD-10-CM | POA: Diagnosis not present

## 2022-05-30 DIAGNOSIS — Z85118 Personal history of other malignant neoplasm of bronchus and lung: Secondary | ICD-10-CM | POA: Insufficient documentation

## 2022-05-30 DIAGNOSIS — R55 Syncope and collapse: Secondary | ICD-10-CM | POA: Insufficient documentation

## 2022-05-30 DIAGNOSIS — R42 Dizziness and giddiness: Secondary | ICD-10-CM | POA: Diagnosis not present

## 2022-05-30 DIAGNOSIS — I2510 Atherosclerotic heart disease of native coronary artery without angina pectoris: Secondary | ICD-10-CM | POA: Diagnosis not present

## 2022-05-30 DIAGNOSIS — I4892 Unspecified atrial flutter: Secondary | ICD-10-CM | POA: Diagnosis not present

## 2022-05-30 DIAGNOSIS — D649 Anemia, unspecified: Secondary | ICD-10-CM | POA: Diagnosis not present

## 2022-05-30 DIAGNOSIS — N1832 Chronic kidney disease, stage 3b: Secondary | ICD-10-CM | POA: Insufficient documentation

## 2022-05-30 DIAGNOSIS — Z7901 Long term (current) use of anticoagulants: Secondary | ICD-10-CM | POA: Diagnosis not present

## 2022-05-30 DIAGNOSIS — J189 Pneumonia, unspecified organism: Secondary | ICD-10-CM | POA: Diagnosis not present

## 2022-05-30 DIAGNOSIS — Z8616 Personal history of COVID-19: Secondary | ICD-10-CM | POA: Insufficient documentation

## 2022-05-30 DIAGNOSIS — E1122 Type 2 diabetes mellitus with diabetic chronic kidney disease: Secondary | ICD-10-CM | POA: Insufficient documentation

## 2022-05-30 DIAGNOSIS — D72829 Elevated white blood cell count, unspecified: Secondary | ICD-10-CM | POA: Diagnosis not present

## 2022-05-30 DIAGNOSIS — J449 Chronic obstructive pulmonary disease, unspecified: Secondary | ICD-10-CM | POA: Insufficient documentation

## 2022-05-30 DIAGNOSIS — R053 Chronic cough: Secondary | ICD-10-CM | POA: Insufficient documentation

## 2022-05-30 DIAGNOSIS — Z856 Personal history of leukemia: Secondary | ICD-10-CM | POA: Insufficient documentation

## 2022-05-30 DIAGNOSIS — I129 Hypertensive chronic kidney disease with stage 1 through stage 4 chronic kidney disease, or unspecified chronic kidney disease: Secondary | ICD-10-CM | POA: Insufficient documentation

## 2022-05-30 DIAGNOSIS — I959 Hypotension, unspecified: Secondary | ICD-10-CM | POA: Diagnosis not present

## 2022-05-30 DIAGNOSIS — I4891 Unspecified atrial fibrillation: Secondary | ICD-10-CM | POA: Diagnosis not present

## 2022-05-30 LAB — COMPREHENSIVE METABOLIC PANEL
ALT: 13 U/L (ref 0–44)
AST: 17 U/L (ref 15–41)
Albumin: 3.5 g/dL (ref 3.5–5.0)
Alkaline Phosphatase: 78 U/L (ref 38–126)
Anion gap: 5 (ref 5–15)
BUN: 19 mg/dL (ref 8–23)
CO2: 24 mmol/L (ref 22–32)
Calcium: 8.9 mg/dL (ref 8.9–10.3)
Chloride: 112 mmol/L — ABNORMAL HIGH (ref 98–111)
Creatinine, Ser: 1.28 mg/dL — ABNORMAL HIGH (ref 0.61–1.24)
GFR, Estimated: 54 mL/min — ABNORMAL LOW (ref >60)
Glucose, Bld: 87 mg/dL (ref 70–99)
Potassium: 4 mmol/L (ref 3.5–5.1)
Sodium: 141 mmol/L (ref 135–145)
Total Bilirubin: 0.3 mg/dL (ref 0.3–1.2)
Total Protein: 6.5 g/dL (ref 6.5–8.1)

## 2022-05-30 LAB — CBC WITH DIFFERENTIAL/PLATELET
Abs Immature Granulocytes: 0.06 10*3/uL (ref 0.00–0.07)
Basophils Absolute: 0.1 10*3/uL (ref 0.0–0.1)
Basophils Relative: 1 %
Eosinophils Absolute: 0.2 10*3/uL (ref 0.0–0.5)
Eosinophils Relative: 2 %
HCT: 32.6 % — ABNORMAL LOW (ref 39.0–52.0)
Hemoglobin: 10.1 g/dL — ABNORMAL LOW (ref 13.0–17.0)
Immature Granulocytes: 1 %
Lymphocytes Relative: 37 %
Lymphs Abs: 4.6 10*3/uL — ABNORMAL HIGH (ref 0.7–4.0)
MCH: 28.9 pg (ref 26.0–34.0)
MCHC: 31 g/dL (ref 30.0–36.0)
MCV: 93.1 fL (ref 80.0–100.0)
Monocytes Absolute: 1 10*3/uL (ref 0.1–1.0)
Monocytes Relative: 8 %
Neutro Abs: 6.5 10*3/uL (ref 1.7–7.7)
Neutrophils Relative %: 51 %
Platelets: 197 10*3/uL (ref 150–400)
RBC: 3.5 MIL/uL — ABNORMAL LOW (ref 4.22–5.81)
RDW: 16.6 % — ABNORMAL HIGH (ref 11.5–15.5)
Smear Review: NORMAL
WBC: 12.4 10*3/uL — ABNORMAL HIGH (ref 4.0–10.5)
nRBC: 0 % (ref 0.0–0.2)

## 2022-05-30 LAB — TROPONIN I (HIGH SENSITIVITY)
Troponin I (High Sensitivity): 16 ng/L (ref <18)
Troponin I (High Sensitivity): 14 ng/L (ref <18)

## 2022-05-30 MED ORDER — DOXYCYCLINE MONOHYDRATE 100 MG PO TABS: 100.0000 mg | ORAL_TABLET | Freq: Two times a day (BID) | ORAL | 0 refills | Status: AC

## 2022-05-30 MED ORDER — CEFPODOXIME PROXETIL 200 MG PO TABS: 200.0000 mg | ORAL_TABLET | Freq: Two times a day (BID) | ORAL | 0 refills | Status: AC

## 2022-05-30 NOTE — ED Triage Notes (Signed)
Patient reports intermittent dizzy spells over the past few weeks. Reports dizziness and shortness of breath earlier today but no complaints at this time. Denies any chest pain.
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
## 2022-05-30 NOTE — Discharge Instructions (Signed)
You may have a developing pneumonia.  Please take the 2 antibiotics twice a day for the next 5 days.  I suspect that your unsteadiness/lightheadedness is due to your blood pressure dropping when you stand up.  Please make sure you are standing up slowly and staying hydrated.  Please discuss this with your primary doctor.  If you develop any new symptoms such as numbness weakness difficulty seeing or speaking chest pain or shortness of breath that is worsening please return to the emergency department.

## 2022-05-30 NOTE — ED Provider Triage Note (Signed)
Emergency Medicine Provider Triage Evaluation Note  Donald [name] , a 88 y.o. male  was evaluated in triage.  Pt complains of hyperlipidemia,, atrial flutter, depression, COPDElevated and pneumonia, presents to the emergency department with shortness of breath and dizziness.  Patient is currently anticoagulated with Eliquis.  Review of Systems  Positive: Patient has SOB and dizziness.  Negative:   Physical Exam  BP (!) 131/54   Pulse (!) 57   Temp 98.2 F (36.8 C) (Oral)   Resp 18   SpO2 96%  Gen:   Awake, no distress   Resp:  Normal effort  MSK:   Moves extremities without difficulty  Other:    Medical Decision Making  Medically screening exam initiated at 1:37 PM.  Appropriate orders placed.  Donald [name] Wince was informed that the remainder of the evaluation will be completed by another provider, this initial triage assessment does not replace that evaluation, and the importance of remaining in the ED until their evaluation is complete.     Donald [name], [place] 05/30/22 1340

## 2022-06-13 ENCOUNTER — Ambulatory Visit
Admission: RE | Admit: 2022-06-13 | Discharge: 2022-06-13 | Disposition: A | Discharge status: Home / Self Care | Payer: No Typology Code available for payment source | Source: Ambulatory Visit | Attending: Emergency Medicine | Admitting: Emergency Medicine

## 2022-06-13 ENCOUNTER — Ambulatory Visit (INDEPENDENT_AMBULATORY_CARE_PROVIDER_SITE_OTHER): Payer: No Typology Code available for payment source

## 2022-06-13 VITALS — BP 159/65 | HR 73 | Temp 98.3°F | Ht 68.0 in | Wt 214.0 lb

## 2022-06-13 DIAGNOSIS — R059 Cough, unspecified: Secondary | ICD-10-CM | POA: Diagnosis not present

## 2022-06-13 DIAGNOSIS — J441 Chronic obstructive pulmonary disease with (acute) exacerbation: Secondary | ICD-10-CM | POA: Diagnosis not present

## 2022-06-13 LAB — SARS CORONAVIRUS 2 BY RT PCR: SARS Coronavirus 2 by RT PCR: NEGATIVE

## 2022-06-13 MED ORDER — IPRATROPIUM BROMIDE 0.06 % NA SOLN: 2.0000 | Freq: Four times a day (QID) | NASAL | 12 refills | Status: DC

## 2022-06-13 MED ORDER — PREDNISONE 20 MG PO TABS: 60.0000 mg | ORAL_TABLET | Freq: Every day | ORAL | 0 refills | Status: AC

## 2022-06-13 MED ORDER — BENZONATATE 100 MG PO CAPS: 200.0000 mg | ORAL_CAPSULE | Freq: Three times a day (TID) | ORAL | 0 refills | Status: DC
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
## 2022-06-13 NOTE — Discharge Instructions (Addendum)
Your COVID test today was negative at the urgent care and your chest x-ray shows an improving pneumonia and not worsening.  I believe your recent pneumonia has worsened your COPD.  Use your albuterol inhaler, 2 puffs every 4-6 hours, as needed for shortness breath and wheezing.  Use the Tessalon Perles every 8 hours as needed for cough.  Use the Atrovent nasal spray, 2 squirts up each nostril every 6 hours, as needed for runny nose.  If you have any new or worsening symptoms please return for reevaluation or see your primary care provider.

## 2022-06-13 NOTE — ED Triage Notes (Signed)
Patient reports a cough for about 3 days.   Patient does have COPD.

## 2022-07-02 DIAGNOSIS — B351 Tinea unguium: Secondary | ICD-10-CM | POA: Diagnosis not present

## 2022-07-02 DIAGNOSIS — E119 Type 2 diabetes mellitus without complications: Secondary | ICD-10-CM | POA: Diagnosis not present

## 2022-07-02 DIAGNOSIS — L600 Ingrowing nail: Secondary | ICD-10-CM | POA: Diagnosis not present

## 2022-07-08 ENCOUNTER — Ambulatory Visit: Payer: Self-pay | Admitting: Dermatology

## 2023-04-14 DIAGNOSIS — M65331 Trigger finger, right middle finger: Secondary | ICD-10-CM | POA: Diagnosis not present

## 2023-04-21 ENCOUNTER — Other Ambulatory Visit: Payer: Self-pay

## 2023-04-21 ENCOUNTER — Emergency Department
Admission: EM | Admit: 2023-04-21 | Discharge: 2023-04-21 | Disposition: A | Discharge status: Home / Self Care | Payer: No Typology Code available for payment source | Attending: Emergency Medicine | Admitting: Emergency Medicine

## 2023-04-21 DIAGNOSIS — S80862A Insect bite (nonvenomous), left lower leg, initial encounter: Secondary | ICD-10-CM | POA: Diagnosis not present

## 2023-04-21 DIAGNOSIS — S80861A Insect bite (nonvenomous), right lower leg, initial encounter: Secondary | ICD-10-CM | POA: Insufficient documentation

## 2023-04-21 DIAGNOSIS — S50862A Insect bite (nonvenomous) of left forearm, initial encounter: Secondary | ICD-10-CM | POA: Diagnosis not present

## 2023-04-21 DIAGNOSIS — W57XXXA Bitten or stung by nonvenomous insect and other nonvenomous arthropods, initial encounter: Secondary | ICD-10-CM | POA: Insufficient documentation

## 2023-04-21 NOTE — ED Provider Notes (Signed)
[facility] Provider Note    Event Date/Time   First MD Initiated Contact with Patient 04/21/23 2040     (approximate)   History   Insect Bite   HPI  Donald [name] is a 89 y.o. male   This gentleman Zentz to the emergency department after getting stung by several bees today.  He thinks he stepped on a nest.  He has several stings to his bilateral legs and a sting to his left forearm.  He has some pain to the affected areas but no significant itching, no allergic reaction and no history of allergies to insect bites.   Independent Historian contributed to assessment above: His family members at bedside to corroborate information given above.       Physical Exam   Triage Vital Signs: ED Triage Vitals  Enc Vitals Group     BP 04/21/23 1949 (!) 175/78     Pulse Rate 04/21/23 1949 70     Resp 04/21/23 1949 18     Temp 04/21/23 1949 98.2 F (36.8 C)     Temp Source 04/21/23 1949 Oral     SpO2 04/21/23 1949 99 %     Weight 04/21/23 1950 205 lb (93 kg)     Height --      Head Circumference --      Peak Flow --      Pain Score 04/21/23 1949 4     Pain Loc --      Pain Edu? --      Excl. in [place]? --     Most recent vital signs: Vitals:   04/21/23 1949  BP: (!) 175/78  Pulse: 70  Resp: 18  Temp: 98.2 F (36.8 C)  SpO2: 99%    General: Awake, no distress.  CV:  Good peripheral perfusion.  Resp:  Normal effort.  Abd:  No distention.  Other:  Small raised red lesions to bilateral lower extremities and a more significant raised red lesion to the forearm.  He otherwise appears well.  Hypertensive otherwise vital signs are normal.  No respiratory distress.  Speaking full sentences.  Comfortable appearing.   ED Results / Procedures / Treatments   Labs (all labs ordered are listed, but only abnormal results are displayed) Labs Reviewed - No data to display    PROCEDURES:  Critical Care performed: No  Procedures   MEDICATIONS  ORDERED IN ED: Medications - No data to display  IMPRESSION / MDM / ASSESSMENT AND PLAN / ED COURSE  I reviewed the triage vital signs and the nursing notes.                                Patient's presentation is most consistent with acute presentation with potential threat to life or bodily function.  Differential diagnosis includes, but is not limited to, insect bite, anaphylaxis, allergic reaction, cellulitis   MDM:    Several insect bites, bee stings, to various locations in his body that appear not to be infected though it is early in the time course.  No significant allergic reaction and now he is approximately 6 hours out from the stings.  Looks well he would like to go home.  I gave him anticipatory guidance about wound care, looking out for signs of infection to return to the emergency department or his primary doctor for antibiotics if he does see signs of infection Discharge.  FINAL CLINICAL IMPRESSION(S) / ED DIAGNOSES   Final diagnoses:  Insect bite, unspecified site, initial encounter     Rx / DC Orders   ED Discharge Orders     None        Note:  This document was prepared using Dragon voice recognition software and may include unintentional dictation errors.    Pilar Jarvis, MD 04/21/23 [phone]

## 2023-04-21 NOTE — ED Triage Notes (Signed)
Pt presents to ER with c/o appx 10-15 yellow jacket stings that happened when he was walking into his building outside.  Pt has multiple red/swollen areas to his arms/legs, but swelling appears localized. Denies any diff breathing.  Pt did take 2 benadryl pta, and used some hydrocortisone cream on affected areas.  States pain is 4/10 where he was stung.  Pt is A&O x4 and in NAD.

## 2023-04-21 NOTE — Discharge Instructions (Signed)
Please keep your wound clean by washing at least daily with soap and water. If you see any signs of infection like spreading redness, pus coming from the wound, extreme pain, fevers, chills or any other worsening doctor right away or come back to the emergency department  Use tylenol 650 mg every 6 hours as needed for pain  Use cetirizine (zyrtec) as directed for itching.

## 2023-06-26 ENCOUNTER — Ambulatory Visit: Payer: Self-pay

## 2023-06-26 ENCOUNTER — Ambulatory Visit
Admission: EM | Admit: 2023-06-26 | Discharge: 2023-06-26 | Payer: No Typology Code available for payment source | Attending: Family Medicine | Admitting: Family Medicine

## 2023-06-26 DIAGNOSIS — R079 Chest pain, unspecified: Secondary | ICD-10-CM | POA: Diagnosis not present

## 2023-06-26 DIAGNOSIS — Z1152 Encounter for screening for COVID-19: Secondary | ICD-10-CM | POA: Diagnosis not present

## 2023-06-26 DIAGNOSIS — C3412 Malignant neoplasm of upper lobe, left bronchus or lung: Secondary | ICD-10-CM | POA: Diagnosis not present

## 2023-06-26 DIAGNOSIS — R1111 Vomiting without nausea: Secondary | ICD-10-CM

## 2023-06-26 DIAGNOSIS — I10 Essential (primary) hypertension: Secondary | ICD-10-CM | POA: Diagnosis not present

## 2023-06-26 DIAGNOSIS — R188 Other ascites: Secondary | ICD-10-CM | POA: Diagnosis not present

## 2023-06-26 DIAGNOSIS — R9431 Abnormal electrocardiogram [ECG] [EKG]: Secondary | ICD-10-CM | POA: Diagnosis not present

## 2023-06-26 DIAGNOSIS — R112 Nausea with vomiting, unspecified: Secondary | ICD-10-CM | POA: Diagnosis not present

## 2023-06-26 DIAGNOSIS — R109 Unspecified abdominal pain: Secondary | ICD-10-CM | POA: Diagnosis not present

## 2023-06-26 DIAGNOSIS — Z452 Encounter for adjustment and management of vascular access device: Secondary | ICD-10-CM | POA: Diagnosis not present

## 2023-06-26 DIAGNOSIS — J449 Chronic obstructive pulmonary disease, unspecified: Secondary | ICD-10-CM | POA: Diagnosis not present

## 2023-06-26 DIAGNOSIS — Z4682 Encounter for fitting and adjustment of non-vascular catheter: Secondary | ICD-10-CM | POA: Diagnosis not present

## 2023-06-26 DIAGNOSIS — K56609 Unspecified intestinal obstruction, unspecified as to partial versus complete obstruction: Secondary | ICD-10-CM | POA: Diagnosis not present

## 2023-06-26 DIAGNOSIS — I4892 Unspecified atrial flutter: Secondary | ICD-10-CM

## 2023-06-26 DIAGNOSIS — C9110 Chronic lymphocytic leukemia of B-cell type not having achieved remission: Secondary | ICD-10-CM | POA: Diagnosis not present

## 2023-06-26 DIAGNOSIS — R918 Other nonspecific abnormal finding of lung field: Secondary | ICD-10-CM | POA: Diagnosis not present

## 2023-06-26 DIAGNOSIS — K56699 Other intestinal obstruction unspecified as to partial versus complete obstruction: Secondary | ICD-10-CM | POA: Diagnosis not present

## 2023-06-26 DIAGNOSIS — K8689 Other specified diseases of pancreas: Secondary | ICD-10-CM | POA: Diagnosis not present

## 2023-06-26 DIAGNOSIS — R1030 Lower abdominal pain, unspecified: Secondary | ICD-10-CM | POA: Diagnosis not present

## 2023-06-26 DIAGNOSIS — E119 Type 2 diabetes mellitus without complications: Secondary | ICD-10-CM | POA: Diagnosis not present

## 2023-06-26 DIAGNOSIS — K529 Noninfective gastroenteritis and colitis, unspecified: Secondary | ICD-10-CM | POA: Diagnosis not present

## 2023-06-26 DIAGNOSIS — K429 Umbilical hernia without obstruction or gangrene: Secondary | ICD-10-CM | POA: Diagnosis not present

## 2023-06-26 DIAGNOSIS — K56600 Partial intestinal obstruction, unspecified as to cause: Secondary | ICD-10-CM | POA: Diagnosis not present

## 2023-06-26 MED ORDER — ONDANSETRON 4 MG PO TBDP
4.0000 mg | ORAL_TABLET | Freq: Once | ORAL | Status: AC
  Administered 2023-06-26: 4 mg via ORAL

## 2023-06-26 NOTE — Discharge Instructions (Addendum)
You have been advised to follow up immediately in the emergency department for concerning signs or symptoms as discussed during your visit. If you declined EMS transport, please have a family member take you directly to the ED at this time. Do not delay.   Based on concerns about condition, if you do not follow up in the ED, you may risk poor outcomes including worsening of condition, delayed treatment and potentially life threatening issues. If you have declined to go to the ED at this time, you should call your PCP immediately to set up a follow up appointment.   

## 2023-06-26 NOTE — ED Triage Notes (Signed)
Pt c/o lower abd pain,excessive burping & emesis x1 day. Had 5 episodes of emesis today. States unable to keep any foods or fluids down. States pain comes & goes, was more constant yesterday. LBM:3 hrs ago. Denies any diarrhea or nausea.

## 2023-06-26 NOTE — ED Notes (Signed)
Patient is being discharged from the Urgent Care and sent to the Emergency Department via POV . Per Katha Cabal, DO, patient is in need of higher level of care due to vomiting and lower abdominal pain. Patient is aware and verbalizes understanding of plan of care.  Vitals:   06/26/23 1633  BP: (!) 154/88  Pulse: (!) 41  Resp: 16  Temp: 98.5 F (36.9 C)  SpO2: 95%
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
## 2023-09-22 ENCOUNTER — Other Ambulatory Visit: Payer: Self-pay

## 2023-09-22 ENCOUNTER — Inpatient Hospital Stay
Admission: EM | Admit: 2023-09-22 | Discharge: 2023-09-26 | DRG: 291 | Disposition: A | Discharge status: Home Health | Payer: No Typology Code available for payment source | Attending: Internal Medicine | Admitting: Internal Medicine

## 2023-09-22 ENCOUNTER — Encounter: Payer: Self-pay | Admitting: Emergency Medicine

## 2023-09-22 ENCOUNTER — Emergency Department: Payer: No Typology Code available for payment source

## 2023-09-22 DIAGNOSIS — I130 Hypertensive heart and chronic kidney disease with heart failure and stage 1 through stage 4 chronic kidney disease, or unspecified chronic kidney disease: Secondary | ICD-10-CM | POA: Diagnosis present

## 2023-09-22 DIAGNOSIS — J9601 Acute respiratory failure with hypoxia: Secondary | ICD-10-CM | POA: Diagnosis present

## 2023-09-22 DIAGNOSIS — F4310 Post-traumatic stress disorder, unspecified: Secondary | ICD-10-CM | POA: Diagnosis present

## 2023-09-22 DIAGNOSIS — I482 Chronic atrial fibrillation, unspecified: Secondary | ICD-10-CM | POA: Diagnosis present

## 2023-09-22 DIAGNOSIS — T502X5A Adverse effect of carbonic-anhydrase inhibitors, benzothiadiazides and other diuretics, initial encounter: Secondary | ICD-10-CM | POA: Diagnosis not present

## 2023-09-22 DIAGNOSIS — R4189 Other symptoms and signs involving cognitive functions and awareness: Secondary | ICD-10-CM | POA: Diagnosis present

## 2023-09-22 DIAGNOSIS — J449 Chronic obstructive pulmonary disease, unspecified: Secondary | ICD-10-CM | POA: Diagnosis present

## 2023-09-22 DIAGNOSIS — Z8701 Personal history of pneumonia (recurrent): Secondary | ICD-10-CM

## 2023-09-22 DIAGNOSIS — I2489 Other forms of acute ischemic heart disease: Secondary | ICD-10-CM | POA: Diagnosis present

## 2023-09-22 DIAGNOSIS — I739 Peripheral vascular disease, unspecified: Secondary | ICD-10-CM | POA: Diagnosis present

## 2023-09-22 DIAGNOSIS — Z79899 Other long term (current) drug therapy: Secondary | ICD-10-CM

## 2023-09-22 DIAGNOSIS — M1990 Unspecified osteoarthritis, unspecified site: Secondary | ICD-10-CM | POA: Diagnosis present

## 2023-09-22 DIAGNOSIS — I4892 Unspecified atrial flutter: Secondary | ICD-10-CM | POA: Diagnosis present

## 2023-09-22 DIAGNOSIS — E669 Obesity, unspecified: Secondary | ICD-10-CM | POA: Diagnosis present

## 2023-09-22 DIAGNOSIS — G9341 Metabolic encephalopathy: Secondary | ICD-10-CM | POA: Diagnosis present

## 2023-09-22 DIAGNOSIS — N179 Acute kidney failure, unspecified: Secondary | ICD-10-CM | POA: Diagnosis not present

## 2023-09-22 DIAGNOSIS — C9110 Chronic lymphocytic leukemia of B-cell type not having achieved remission: Secondary | ICD-10-CM | POA: Diagnosis present

## 2023-09-22 DIAGNOSIS — Z7901 Long term (current) use of anticoagulants: Secondary | ICD-10-CM | POA: Diagnosis not present

## 2023-09-22 DIAGNOSIS — E785 Hyperlipidemia, unspecified: Secondary | ICD-10-CM | POA: Diagnosis present

## 2023-09-22 DIAGNOSIS — R0789 Other chest pain: Secondary | ICD-10-CM | POA: Diagnosis present

## 2023-09-22 DIAGNOSIS — Z8249 Family history of ischemic heart disease and other diseases of the circulatory system: Secondary | ICD-10-CM | POA: Diagnosis not present

## 2023-09-22 DIAGNOSIS — I2510 Atherosclerotic heart disease of native coronary artery without angina pectoris: Secondary | ICD-10-CM | POA: Diagnosis present

## 2023-09-22 DIAGNOSIS — J439 Emphysema, unspecified: Secondary | ICD-10-CM | POA: Diagnosis present

## 2023-09-22 DIAGNOSIS — G629 Polyneuropathy, unspecified: Secondary | ICD-10-CM | POA: Diagnosis present

## 2023-09-22 DIAGNOSIS — R0609 Other forms of dyspnea: Secondary | ICD-10-CM

## 2023-09-22 DIAGNOSIS — R7989 Other specified abnormal findings of blood chemistry: Secondary | ICD-10-CM

## 2023-09-22 DIAGNOSIS — I509 Heart failure, unspecified: Principal | ICD-10-CM

## 2023-09-22 DIAGNOSIS — I161 Hypertensive emergency: Secondary | ICD-10-CM

## 2023-09-22 DIAGNOSIS — I5033 Acute on chronic diastolic (congestive) heart failure: Secondary | ICD-10-CM | POA: Diagnosis present

## 2023-09-22 DIAGNOSIS — Z888 Allergy status to other drugs, medicaments and biological substances status: Secondary | ICD-10-CM

## 2023-09-22 DIAGNOSIS — Z806 Family history of leukemia: Secondary | ICD-10-CM

## 2023-09-22 DIAGNOSIS — Z85118 Personal history of other malignant neoplasm of bronchus and lung: Secondary | ICD-10-CM

## 2023-09-22 DIAGNOSIS — Z923 Personal history of irradiation: Secondary | ICD-10-CM

## 2023-09-22 DIAGNOSIS — K219 Gastro-esophageal reflux disease without esophagitis: Secondary | ICD-10-CM | POA: Diagnosis present

## 2023-09-22 DIAGNOSIS — N1831 Chronic kidney disease, stage 3a: Secondary | ICD-10-CM | POA: Diagnosis present

## 2023-09-22 DIAGNOSIS — N400 Enlarged prostate without lower urinary tract symptoms: Secondary | ICD-10-CM | POA: Diagnosis present

## 2023-09-22 DIAGNOSIS — C9111 Chronic lymphocytic leukemia of B-cell type in remission: Secondary | ICD-10-CM | POA: Diagnosis present

## 2023-09-22 DIAGNOSIS — Z8673 Personal history of transient ischemic attack (TIA), and cerebral infarction without residual deficits: Secondary | ICD-10-CM

## 2023-09-22 DIAGNOSIS — Z801 Family history of malignant neoplasm of trachea, bronchus and lung: Secondary | ICD-10-CM

## 2023-09-22 DIAGNOSIS — Z9981 Dependence on supplemental oxygen: Secondary | ICD-10-CM

## 2023-09-22 DIAGNOSIS — Z87891 Personal history of nicotine dependence: Secondary | ICD-10-CM | POA: Diagnosis not present

## 2023-09-22 DIAGNOSIS — Z6832 Body mass index (BMI) 32.0-32.9, adult: Secondary | ICD-10-CM | POA: Diagnosis not present

## 2023-09-22 DIAGNOSIS — Z881 Allergy status to other antibiotic agents status: Secondary | ICD-10-CM

## 2023-09-22 LAB — CBC
HCT: 32.9 % — ABNORMAL LOW (ref 39.0–52.0)
Hemoglobin: 10.4 g/dL — ABNORMAL LOW (ref 13.0–17.0)
MCH: 28.7 pg (ref 26.0–34.0)
MCHC: 31.6 g/dL (ref 30.0–36.0)
MCV: 90.6 fL (ref 80.0–100.0)
Platelets: 184 10*3/uL (ref 150–400)
RBC: 3.63 MIL/uL — ABNORMAL LOW (ref 4.22–5.81)
RDW: 15.9 % — ABNORMAL HIGH (ref 11.5–15.5)
WBC: 11.7 10*3/uL — ABNORMAL HIGH (ref 4.0–10.5)
nRBC: 0 % (ref 0.0–0.2)

## 2023-09-22 LAB — BASIC METABOLIC PANEL
Anion gap: 9 (ref 5–15)
BUN: 17 mg/dL (ref 8–23)
CO2: 24 mmol/L (ref 22–32)
Calcium: 8.8 mg/dL — ABNORMAL LOW (ref 8.9–10.3)
Chloride: 109 mmol/L (ref 98–111)
Creatinine, Ser: 1.23 mg/dL (ref 0.61–1.24)
GFR, Estimated: 56 mL/min — ABNORMAL LOW (ref >60)
Glucose, Bld: 135 mg/dL — ABNORMAL HIGH (ref 70–99)
Potassium: 3.5 mmol/L (ref 3.5–5.1)
Sodium: 142 mmol/L (ref 135–145)

## 2023-09-22 LAB — TROPONIN I (HIGH SENSITIVITY)
Troponin I (High Sensitivity): 74 ng/L — ABNORMAL HIGH (ref <18)
Troponin I (High Sensitivity): 81 ng/L — ABNORMAL HIGH (ref <18)

## 2023-09-22 LAB — BRAIN NATRIURETIC PEPTIDE: B Natriuretic Peptide: 419 pg/mL — ABNORMAL HIGH (ref 0.0–100.0)

## 2023-09-22 LAB — PROTIME-INR
INR: 1.1 (ref 0.8–1.2)
Prothrombin Time: 14.8 s (ref 11.4–15.2)

## 2023-09-22 MED ORDER — FUROSEMIDE 10 MG/ML IJ SOLN
40.0000 mg | Freq: Once | INTRAMUSCULAR | Status: DC
  Filled 2023-09-22: qty 4

## 2023-09-22 NOTE — ED Notes (Signed)
IV attempt x 2 without success Rt hand and left AC - site clear

## 2023-09-22 NOTE — ED Triage Notes (Signed)
Patient to ED via POV from UC. Patient c/o right sided CP and SOB. Daughter reports O2 dropping into 80's with exertion and possible pneumonia. Speaking in full sentences without difficulty.

## 2023-09-22 NOTE — ED Notes (Signed)
Pt placed on cardiac monitor, NIBP and pulse oximetry. Drowsy but arouses to name and converses appropriately. Family at bedside.

## 2023-09-22 NOTE — ED Provider Notes (Signed)
[facility] Provider Note    Event Date/Time   First MD Initiated Contact with Patient 09/22/23 2104     (approximate)   History   Chest Pain   HPI Donald [name] is a 89 y.o. male with history of HTN, HLD, COPD presenting today for shortness of breath.  Patient states over the past 3 days he has had worsening shortness of breath with exertion.  Also has intermittent cough.  He occasionally feels a chest tightness.  Otherwise denies fever, nausea, vomiting, abdominal pain, nasal congestion.  Patient states swelling to his lower extremities but states that is always there and unsure if it is changed recently.  Does have frequent history in the past of pneumonia.  Separately, patient has history of CLL which had been in remission.  According to his oncologist at the [facility], there was plans to restart medication for treatment.  He has not started taking this medication yet.  Reviewed most recent chart notes.     Physical Exam   Triage Vital Signs: ED Triage Vitals  Encounter Vitals Group     BP 09/22/23 1829 (!) 190/93     Systolic BP Percentile --      Diastolic BP Percentile --      Pulse Rate 09/22/23 1829 84     Resp 09/22/23 1829 18     Temp 09/22/23 1829 97.6 F (36.4 C)     Temp Source 09/22/23 1829 Oral     SpO2 09/22/23 1829 91 %     Weight 09/22/23 1826 214 lb (97.1 kg)     Height 09/22/23 1826 5\' 8"  (1.727 m)     Head Circumference --      Peak Flow --      Pain Score 09/22/23 1826 0     Pain Loc --      Pain Education --      Exclude from Growth Chart --     Most recent vital signs: Vitals:   09/22/23 1829 09/22/23 2200  BP: (!) 190/93 (!) 196/74  Pulse: 84 74  Resp: 18 18  Temp: 97.6 F (36.4 C) 98.8 F (37.1 C)  SpO2: 91% 99%   Physical Exam: I have reviewed the vital signs and nursing notes. General: Awake, alert, no acute distress.  Nontoxic appearing. Head:  Atraumatic, normocephalic.   ENT:  EOM intact, PERRL.  Oral mucosa is pink and moist with no lesions. Neck: Neck is supple with full range of motion, No meningeal signs. Cardiovascular:  RRR, No murmurs. Peripheral pulses palpable and equal bilaterally. Respiratory:  Symmetrical chest wall expansion.  Rhonchi noted most prominently in the right sided lung.  No wheezing.  Good air movement throughout.  No use of accessory muscles.   Musculoskeletal:  No cyanosis.  1+ pitting edema to bilateral lower extremities.  Moving extremities with full ROM Abdomen:  Soft, nontender, nondistended. Neuro:  GCS 15, moving all four extremities, interacting appropriately. Speech clear. Psych:  Calm, appropriate.   Skin:  Warm, dry, no rash.    ED Results / Procedures / Treatments   Labs (all labs ordered are listed, but only abnormal results are displayed) Labs Reviewed  BASIC METABOLIC PANEL - Abnormal; Notable for the following components:      Result Value   Glucose, Bld 135 (*)    Calcium 8.8 (*)    GFR, Estimated 56 (*)    All other components within normal limits  CBC - Abnormal; Notable for the following  components:   WBC 11.7 (*)    RBC 3.63 (*)    Hemoglobin 10.4 (*)    HCT 32.9 (*)    RDW 15.9 (*)    All other components within normal limits  BRAIN NATRIURETIC PEPTIDE - Abnormal; Notable for the following components:   B Natriuretic Peptide 419.0 (*)    All other components within normal limits  TROPONIN I (HIGH SENSITIVITY) - Abnormal; Notable for the following components:   Troponin I (High Sensitivity) 74 (*)    All other components within normal limits  TROPONIN I (HIGH SENSITIVITY) - Abnormal; Notable for the following components:   Troponin I (High Sensitivity) 81 (*)    All other components within normal limits  PROTIME-INR     EKG My EKG interpretation: Rate of 79.  Unsure rhythm but some concern for possible atrial flutter.  Right bundle branch block.  No acute ST elevations or depressions.   RADIOLOGY Independently  interpreted chest x-ray with evidence of pulmonary edema   PROCEDURES:  Critical Care performed: No  Procedures   MEDICATIONS ORDERED IN ED: Medications  furosemide (LASIX) injection 40 mg (has no administration in time range)     IMPRESSION / MDM / ASSESSMENT AND PLAN / ED COURSE  I reviewed the triage vital signs and the nursing notes.                              Differential diagnosis includes, but is not limited to, CHF exacerbation, COPD exacerbation, pneumonia  Patient's presentation is most consistent with acute complicated illness / injury requiring diagnostic workup.  Patient is an 89 year old male presenting today for 3 days of worsening dyspnea on exertion.  Intermittent chest tightness associated with it and a nonproductive cough.  Vital signs notable for hypertension but otherwise unremarkable.  Patient reportedly had mild hypoxia with ambulation but is not present at rest.  No tachypnea at rest.  Exam notable for rhonchi bilaterally.  1+ pitting edema to lower extremities.  Most concern for CHF exacerbation.  Chest x-ray shows evidence of pulmonary edema.  BNP elevated at 419.  Mild elevation in troponins at 79 and 81.  Will admit to hospitalist for CHF exacerbation.  The patient is on the cardiac monitor to evaluate for evidence of arrhythmia and/or significant heart rate changes. Clinical Course as of 09/22/23 2321  Mon Sep 22, 2023  2107 Troponin I (High Sensitivity)(!): 74 [DW]  2243 DG Chest 2 View 1. Cardiomegaly with mild diffuse interstitial opacity and small bilateral effusions, suspect for CHF. [DW]    Clinical Course User Index [DW] Janith Lima, MD     FINAL CLINICAL IMPRESSION(S) / ED DIAGNOSES   Final diagnoses:  Acute on chronic congestive heart failure, unspecified heart failure type (HCC)  Dyspnea on exertion     Rx / DC Orders   ED Discharge Orders     None        Note:  This document was prepared using Dragon voice  recognition software and may include unintentional dictation errors.   Janith Lima, MD 09/22/23 [phone]

## 2023-09-23 ENCOUNTER — Other Ambulatory Visit: Payer: Self-pay

## 2023-09-23 DIAGNOSIS — R7989 Other specified abnormal findings of blood chemistry: Secondary | ICD-10-CM

## 2023-09-23 DIAGNOSIS — I161 Hypertensive emergency: Secondary | ICD-10-CM

## 2023-09-23 DIAGNOSIS — Z7901 Long term (current) use of anticoagulants: Secondary | ICD-10-CM

## 2023-09-23 MED ORDER — FUROSEMIDE 10 MG/ML IJ SOLN
40.0000 mg | Freq: Once | INTRAMUSCULAR | Status: AC
  Administered 2023-09-23: 40 mg via INTRAVENOUS
  Filled 2023-09-23: qty 4

## 2023-09-23 MED ORDER — ONDANSETRON HCL 4 MG/2ML IJ SOLN: 4.0000 mg | Freq: Four times a day (QID) | INTRAMUSCULAR | Status: DC | PRN

## 2023-09-23 MED ORDER — NITROGLYCERIN IN D5W 200-5 MCG/ML-% IV SOLN
0.0000 ug/min | INTRAVENOUS | Status: DC
  Administered 2023-09-23: 65 ug/min via INTRAVENOUS
  Administered 2023-09-23: 5 ug/min via INTRAVENOUS
  Filled 2023-09-23: qty 250

## 2023-09-23 MED ORDER — ACETAMINOPHEN 325 MG PO TABS
650.0000 mg | ORAL_TABLET | Freq: Four times a day (QID) | ORAL | Status: DC | PRN
  Administered 2023-09-24: 650 mg via ORAL
  Filled 2023-09-23: qty 2

## 2023-09-23 MED ORDER — FUROSEMIDE 10 MG/ML IJ SOLN
40.0000 mg | Freq: Two times a day (BID) | INTRAMUSCULAR | Status: DC
  Administered 2023-09-23 – 2023-09-24 (×3): 40 mg via INTRAVENOUS
  Filled 2023-09-23 (×3): qty 4

## 2023-09-23 MED ORDER — APIXABAN 2.5 MG PO TABS
2.5000 mg | ORAL_TABLET | Freq: Two times a day (BID) | ORAL | Status: DC
  Administered 2023-09-23 – 2023-09-26 (×7): 2.5 mg via ORAL
  Filled 2023-09-23 (×7): qty 1

## 2023-09-23 MED ORDER — METOPROLOL TARTRATE 25 MG PO TABS
25.0000 mg | ORAL_TABLET | Freq: Two times a day (BID) | ORAL | Status: DC
Start: 2023-09-23 — End: 2023-09-26
  Administered 2023-09-23 – 2023-09-26 (×7): 25 mg via ORAL
  Filled 2023-09-23 (×7): qty 1

## 2023-09-23 MED ORDER — LISINOPRIL 10 MG PO TABS
10.0000 mg | ORAL_TABLET | Freq: Every day | ORAL | Status: DC
  Administered 2023-09-23: 10 mg via ORAL
  Filled 2023-09-23: qty 1

## 2023-09-23 MED ORDER — LISINOPRIL 20 MG PO TABS
40.0000 mg | ORAL_TABLET | Freq: Every day | ORAL | Status: DC
  Administered 2023-09-24: 40 mg via ORAL
  Filled 2023-09-23: qty 2

## 2023-09-23 MED ORDER — HYDRALAZINE HCL 25 MG PO TABS
25.0000 mg | ORAL_TABLET | Freq: Four times a day (QID) | ORAL | Status: DC | PRN
Start: 2023-09-23 — End: 2023-09-26
  Administered 2023-09-23 – 2023-09-24 (×2): 25 mg via ORAL
  Filled 2023-09-23 (×2): qty 1

## 2023-09-23 MED ORDER — TAMSULOSIN HCL 0.4 MG PO CAPS
0.4000 mg | ORAL_CAPSULE | Freq: Every day | ORAL | Status: DC
  Administered 2023-09-23 – 2023-09-26 (×4): 0.4 mg via ORAL
  Filled 2023-09-23 (×4): qty 1

## 2023-09-23 MED ORDER — ACETAMINOPHEN 650 MG RE SUPP
650.0000 mg | Freq: Four times a day (QID) | RECTAL | Status: DC | PRN
Start: 2023-09-23 — End: 2023-09-26

## 2023-09-23 MED ORDER — ALBUTEROL SULFATE (2.5 MG/3ML) 0.083% IN NEBU: 2.5000 mg | INHALATION_SOLUTION | RESPIRATORY_TRACT | Status: DC | PRN

## 2023-09-23 MED ORDER — ATORVASTATIN CALCIUM 80 MG PO TABS
80.0000 mg | ORAL_TABLET | Freq: Every day | ORAL | Status: DC
  Administered 2023-09-23 – 2023-09-26 (×4): 80 mg via ORAL
  Filled 2023-09-23: qty 4
  Filled 2023-09-23 (×3): qty 1

## 2023-09-23 MED ORDER — HYDRALAZINE HCL 20 MG/ML IJ SOLN
10.0000 mg | Freq: Four times a day (QID) | INTRAMUSCULAR | Status: DC | PRN
  Administered 2023-09-23 (×2): 10 mg via INTRAVENOUS
  Filled 2023-09-23 (×3): qty 1

## 2023-09-23 MED ORDER — ONDANSETRON HCL 4 MG PO TABS: 4.0000 mg | ORAL_TABLET | Freq: Four times a day (QID) | ORAL | Status: DC | PRN

## 2023-09-23 MED ORDER — ENALAPRILAT 1.25 MG/ML IV SOLN: 1.2500 mg | Freq: Four times a day (QID) | INTRAVENOUS | Status: DC | PRN

## 2023-09-23 NOTE — ED Notes (Signed)
Spoke with pharmacist and he is working on verification of Lopressor dose and does not want this Clinical research associate to administer or address dose at this time.

## 2023-09-23 NOTE — Assessment & Plan Note (Signed)
Not acutely exacerbated ?Continue home inhalers with DuoNebs as needed ?

## 2023-09-23 NOTE — ED Notes (Signed)
Patient stated he felt like he needed to have a BM. This RN placed patient on the bedpan. Patient was unable to have a BM and said he "just had gas". This RN removed the bedpan and removed old paper pads from under the patient. This RN placed new paper pads under the patient and confirmed placement of his purewick. Patient's bed returned to lowest position with call light in reach. Patient denies other needs at this time.

## 2023-09-23 NOTE — Assessment & Plan Note (Signed)
Elevated troponin Denies chest pain and EKG nonacute Suspect demand ischemia Continue metoprolol, atorvastatin and apixaban Trend troponin

## 2023-09-23 NOTE — Consult Note (Signed)
[facility] CLINIC CARDIOLOGY CONSULT NOTE       Patient ID: Donald [name] MRN: [id number] DOB/AGE: Sep 21, 1933 89 y.o.  Admit date: 09/22/2023 Referring Physician Dr. Lurene Shadow Primary Physician Ricky Ala, NP  Primary Cardiologist Dr. Gwen Pounds (last seen 2022) Reason for Consultation acute heart failure, elevated troponins  HPI: Donald [name] is a 89 y.o. male  with a past medical history of coronary artery disease by CT, atrial fibrillation/flutter, COPD, CLL, peripheral vascular disease, hypertension, hyperlipidemia who presented to the ED on 09/22/2023 for shortness of breath and chest pain. Cardiology was consulted for further evaluation.   Patient reports that since Saturday he has then experiencing worsening shortness of breath with exertion.  Reports that he also experienced some intermittent chest discomfort on Sunday, this typically occurred with exertion.  His breathing progressively worsened and his daughter brought him to the ED for further evaluation.  Workup in the ED notable for creatinine 1.23, potassium 3.5, hemoglobin 10.4, WBC 11.7.  BNP mildly elevated at 419.  Troponins borderline elevated and flat trending at 74 > 81.  Noted to be extremely hypertensive in the ED with pressures up to 240 systolic.  He was started on IV Lasix as well as nitroglycerin infusion to help control BP in the ED.  At the time my evaluation this morning patient is resting comfortably in ED stretcher with daughter present at bedside.  Discussed his recent symptoms.  BP remains elevated but overall improved on nitroglycerin infusion.  Patient reports that he checks his blood pressure at home and this has been controlled.  Denies any recent episodes of palpitations, states that he cannot tell when he is in atrial fibrillation/flutter.  States that overall he feels better now than when he first came in.  Endorses great urine output with IV Lasix.  Urine is clear.  Denies any active chest  pain or shortness of breath.  States that he only experiences shortness of breath when he is exerting himself.  He has a history of COPD and states that he previously wore supplemental O2 but it was determined that he did not actually need this since it was stopped.  He is on 2L [place] in the ED for comfort.  Review of systems complete and found to be negative unless listed above    Past Medical History:  Diagnosis Date   Anemia    Arthritis    BPH (benign prostatic hyperplasia)    Cancer (HCC)    Lung   COPD (chronic obstructive pulmonary disease) (HCC)    Emphysema of lung (HCC)    Emphysema of lung (HCC)    Esophageal reflux    Hyperlipidemia    Hypertension    Internal carotid artery stenosis, right    Neuropathy of both feet    Pneumonia    recurrent   PTSD (post-traumatic stress disorder)    [place] vet   Sebaceous cyst    SOB (shortness of breath) on exertion    TIA (transient ischemic attack)    Urge incontinence     Past Surgical History:  Procedure Laterality Date   CYST EXCISION     buttocks   ELECTROMAGNETIC NAVIGATION BROCHOSCOPY Left 08/23/2019   Procedure: ELECTROMAGNETIC NAVIGATION BRONCHOSCOPY;  Surgeon: Salena Saner, MD;  Location: [facility] ORS;  Service: Cardiopulmonary;  Laterality: Left;   ENDOBRONCHIAL ULTRASOUND N/A 08/23/2019   Procedure: ENDOBRONCHIAL ULTRASOUND;  Surgeon: Salena Saner, MD;  Location: [facility] ORS;  Service: Cardiopulmonary;  Laterality: N/A;   HEMORRHOID SURGERY  shave biopsy  03/13/2020   left neck 03/13/20 [place] [facility] Mikki Santee wart with atypica inflamed +AK no carcinoma    SINUSOTOMY     VIDEO BRONCHOSCOPY WITH ENDOBRONCHIAL NAVIGATION Left 06/26/2020   Procedure: VIDEO BRONCHOSCOPY WITH ENDOBRONCHIAL NAVIGATION;  Surgeon: Salena Saner, MD;  Location: [facility] ORS;  Service: Pulmonary;  Laterality: Left;    (Not in a hospital admission)  Social History   Socioeconomic History   Marital status: Married    Spouse name:  Donald [name]   Number of children: 2   Years of education: Not on file   Highest education level: Not on file  Occupational History   Not on file  Tobacco Use   Smoking status: Former    Current packs/day: 0.00    Average packs/day: 1.5 packs/day for 50.0 years (75.0 ttl pk-yrs)    Types: Cigarettes    Start date: 09/09/1949    Quit date: 09/10/1999    Years since quitting: 24.0   Smokeless tobacco: Never  Vaping Use   Vaping status: Never Used  Substance and Sexual Activity   Alcohol use: No    Alcohol/week: 0.0 standard drinks of alcohol    Comment: not since 12/1980   Drug use: No   Sexual activity: Yes  Other Topics Concern   Not on file  Social History Narrative   Lives in [place] with wife. Dog and cat in home.      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  Recruitment consultant.      Diet - regular      Exercise - Walking   Social Determinants of Health   Financial Resource Strain: Low Risk  (10/21/2018)   Overall Financial Resource Strain (CARDIA)    Difficulty of Paying Living Expenses: Not hard at all  Food Insecurity: No Food Insecurity (10/21/2018)   Hunger Vital Sign    Worried About Running Out of Food in the Last Year: Never true    Ran Out of Food in the Last Year: Never true  Transportation Needs: No Transportation Needs (05/24/2020)   PRAPARE - Administrator, Civil Service (Medical): No    Lack of Transportation (Non-Medical): No  Physical Activity: Insufficiently Active (05/24/2020)   Exercise Vital Sign    Days of Exercise per Week: 5 days    Minutes of Exercise per Session: 20 min  Stress: No Stress Concern Present (05/24/2020)   [employer] of Occupational Health - Occupational Stress Questionnaire    Feeling of Stress : Not at all  Social Connections: Unknown (05/24/2020)   Social Connection and Isolation Panel [NHANES]    Frequency of Communication with Friends and Family: Not on file    Frequency of Social Gatherings with Friends  and Family: Not on file    Attends Religious Services: Not on file    Active Member of Clubs or Organizations: Not on file    Attends Banker Meetings: Not on file    Marital Status: Married  Intimate Partner Violence: Not At Risk (05/24/2020)   Humiliation, Afraid, Rape, and Kick questionnaire    Fear of Current or Ex-Partner: No    Emotionally Abused: No    Physically Abused: No    Sexually Abused: No    Family History  Problem Relation Age of Onset   Heart disease Mother    Cancer Maternal Aunt        lung   Lung cancer Maternal Aunt    Leukemia Maternal Aunt  Hypertension Son      Vitals:   09/23/23 1315 09/23/23 1330 09/23/23 1333 09/23/23 1334  BP: 135/84 (!) 155/70 (!) 167/53   Pulse:  (!) 37 80   Resp: (!) 22 (!) 23 19   Temp:    98.5 F (36.9 C)  TempSrc:    Oral  SpO2:  92% 100%   Weight:      Height:        PHYSICAL EXAM General: Chronically ill-appearing, well nourished, in no acute distress. HEENT: Normocephalic and atraumatic. Neck: No JVD.  Lungs: Normal respiratory effort on 2L [place]. Clear bilaterally to auscultation. + Rhonchi.  No wheezing. Heart: Irregularly irregular, controlled rate. Normal S1 and S2 without gallops or murmurs.  Abdomen: Non-distended appearing.  Msk: Normal strength and tone for age. Extremities: Warm and well perfused. No clubbing, cyanosis.  Trace edema.  Neuro: Alert and oriented X 3. Psych: Answers questions appropriately.   Labs: Basic Metabolic Panel: Recent Labs    09/22/23 1828  NA 142  K 3.5  CL 109  CO2 24  GLUCOSE 135*  BUN 17  CREATININE 1.23  CALCIUM 8.8*   Liver Function Tests: No results for input(s): "AST", "ALT", "ALKPHOS", "BILITOT", "PROT", "ALBUMIN" in the last 72 hours. No results for input(s): "LIPASE", "AMYLASE" in the last 72 hours. CBC: Recent Labs    09/22/23 1828  WBC 11.7*  HGB 10.4*  HCT 32.9*  MCV 90.6  PLT 184   Cardiac Enzymes: Recent Labs    09/22/23 1828  09/22/23 2215  TROPONINIHS 74* 81*   BNP: Recent Labs    09/22/23 2215  BNP 419.0*   D-Dimer: No results for input(s): "DDIMER" in the last 72 hours. Hemoglobin A1C: No results for input(s): "HGBA1C" in the last 72 hours. Fasting Lipid Panel: No results for input(s): "CHOL", "HDL", "LDLCALC", "TRIG", "CHOLHDL", "LDLDIRECT" in the last 72 hours. Thyroid Function Tests: No results for input(s): "TSH", "T4TOTAL", "T3FREE", "THYROIDAB" in the last 72 hours.  Invalid input(s): "FREET3" Anemia Panel: No results for input(s): "VITAMINB12", "FOLATE", "FERRITIN", "TIBC", "IRON", "RETICCTPCT" in the last 72 hours.   Radiology: DG Chest 2 View  Result Date: 09/22/2023 CLINICAL DATA:  Chest pain short of breath EXAM: CHEST - 2 VIEW COMPARISON:  06/13/2022, CT 04/15/2022 FINDINGS: Small bilateral effusions. Cardiomegaly with mild diffuse interstitial opacity. Irregular left apical opacity corresponding to prior nodule and possible post treatment changes. This appears more confluent in the interim. IMPRESSION: 1. Cardiomegaly with mild diffuse interstitial opacity and small bilateral effusions, suspect for CHF. 2. Irregular left apical opacity corresponding to prior nodule and post treatment changes but radiographically appears more dense and confluent,, suggest correlation with chest CT. Electronically Signed   By: Jasmine Pang M.D.   On: 09/22/2023 22:35    ECHO pending  TELEMETRY reviewed by me 09/23/2023: Atrial flutter rate 70-80s  EKG reviewed by me: Atrial flutter with variable block rate 79 bpm  Data reviewed by me 09/23/2023: last 24h vitals tele labs imaging I/O ED provider note, admission H&P  Principal Problem:   Hypertensive emergency Active Problems:   COPD   CLL   Coronary artery disease involving native coronary artery of native heart without angina pectoris   Atrial fibrillation, chronic (HCC)   Acute CHF (congestive heart failure) (HCC)   Long term current use of  anticoagulant   Elevated troponin    ASSESSMENT AND PLAN:  Heston Castella is a 89 y.o. male  with a past medical history of coronary  artery disease by CT, atrial fibrillation/flutter, COPD, CLL, peripheral vascular disease, hypertension, hyperlipidemia who presented to the ED on 09/22/2023 for shortness of breath and chest pain. Cardiology was consulted for further evaluation.   # Acute on chronic HFpEF (EF 50-55% in 03/2021) # Hypertensive emergency # Demand ischemia Patient presented with shortness of breath worsening since Saturday.  BNP 419.  Troponins mildly elevated and flat trending 74 > 81.  EKG with rate controlled atrial flutter.  BP significantly elevated while in the ED.  -Echo pending -Continue IV Lasix 40 mg twice daily -Continue nitroglycerin infusion.  Plan to wean off of this and adjust BP meds tomorrow. -Will increase lisinopril to 40 mg starting tomorrow.  Continue metoprolol 25 mg twice daily. -Troponin elevation most consistent with demand/supply mismatch and not ACS in the setting of acute heart failure and hypertensive emergency. -Continue to monitor renal function closely with diuresis.  # Atrial flutter Patient with known history of atrial fibrillation/flutter.  Noted to be in a flutter with controlled rates since admitted. -Continue metoprolol as above for rate control. -Continue Eliquis 2.5 mg twice daily for stroke risk reduction.  Based on criteria patient does not qualify for reduced dose but it appears he has been taking this dose since 2022 or before.   This patient's plan of care was discussed and created with Dr. Juliann Pares and he is in agreement.  Signed: Gale Journey, PA-C  09/23/2023, 1:59 PM [facility] Cardiology

## 2023-09-23 NOTE — ED Notes (Signed)
Pt with soiled linens and brief. RN and EDT in room to change linens and clean pt.

## 2023-09-23 NOTE — ED Notes (Signed)
Patient pulled up in bed and situated at this time

## 2023-09-23 NOTE — H&P (Signed)
History and Physical    Patient: Donald [name] [name] DOB: 1933-06-27 DOA: 09/22/2023 DOS: the patient was seen and examined on 09/23/2023 PCP: Ricky Ala, NP  Patient coming from: Home  Chief Complaint:  Chief Complaint  Patient presents with   Chest Pain    HPI: Donald [name] is a 89 y.o. male with medical history significant for COPD, no longer on home O2, HTN, A flutter on Eliquis, CLL, lung cancer s/p radiation, right ICA stenosis and BPH being admitted with a working diagnosis of CHF exacerbation.  Patient gets most of his care at the [place] and states he is compliant with all his meds.  History provided by patient as well as daughter at bedside.  Patient was in his usual state of health until 3 days prior when he started developing shortness of breath.  He denies lower extremity edema or orthopnea.  Denies chest pain.  Has an occasional cough and denies fever or chills. ED course and data review: BP as high as 220/81 with mild tachypnea to 21 and O2 sats 91% on room air for which she was placed on O2 at 2 L. Labs notable for troponin 81 and BNP 419 Baseline leukocytosis of 11.7, baseline anemia of 10.4 BMP unremarkable EKG, personally reviewed and interpreted showing a flutter at 79 with nonspecific ST-T wave changes Chest x-ray consistent with CHF as follows IMPRESSION: 1. Cardiomegaly with mild diffuse interstitial opacity and small bilateral effusions, suspect for CHF. 2. Irregular left apical opacity corresponding to prior nodule and post treatment changes but radiographically appears more dense and confluent,, suggest correlation with chest CT.  Patient treated with IV Lasix 40 mg Hospitalist consulted for admission.   Review of Systems: As mentioned in the history of present illness. All other systems reviewed and are negative.  Past Medical History:  Diagnosis Date   Anemia    Arthritis    BPH (benign prostatic hyperplasia)    Cancer (HCC)     Lung   COPD (chronic obstructive pulmonary disease) (HCC)    Emphysema of lung (HCC)    Emphysema of lung (HCC)    Esophageal reflux    Hyperlipidemia    Hypertension    Internal carotid artery stenosis, right    Neuropathy of both feet    Pneumonia    recurrent   PTSD (post-traumatic stress disorder)    [place] vet   Sebaceous cyst    SOB (shortness of breath) on exertion    TIA (transient ischemic attack)    Urge incontinence    Past Surgical History:  Procedure Laterality Date   CYST EXCISION     buttocks   ELECTROMAGNETIC NAVIGATION BROCHOSCOPY Left 08/23/2019   Procedure: ELECTROMAGNETIC NAVIGATION BRONCHOSCOPY;  Surgeon: Salena Saner, MD;  Location: [facility] ORS;  Service: Cardiopulmonary;  Laterality: Left;   ENDOBRONCHIAL ULTRASOUND N/A 08/23/2019   Procedure: ENDOBRONCHIAL ULTRASOUND;  Surgeon: Salena Saner, MD;  Location: [facility] ORS;  Service: Cardiopulmonary;  Laterality: N/A;   HEMORRHOID SURGERY     shave biopsy  03/13/2020   left neck 03/13/20 [place] [facility] Mikki Santee wart with atypica inflamed +AK no carcinoma    SINUSOTOMY     VIDEO BRONCHOSCOPY WITH ENDOBRONCHIAL NAVIGATION Left 06/26/2020   Procedure: VIDEO BRONCHOSCOPY WITH ENDOBRONCHIAL NAVIGATION;  Surgeon: Salena Saner, MD;  Location: [facility] ORS;  Service: Pulmonary;  Laterality: Left;   Social History:  reports that he quit smoking about 24 years ago. His smoking use included cigarettes. He started smoking  about 74 years ago. He has a 75 pack-year smoking history. He has never used smokeless tobacco. He reports that he does not drink alcohol and does not use drugs.  Allergies  Allergen Reactions   Lovastatin Other (See Comments)   Cephalexin Rash and Hives    Family History  Problem Relation Age of Onset   Heart disease Mother    Cancer Maternal Aunt        lung   Lung cancer Maternal Aunt    Leukemia Maternal Aunt    Hypertension Son     Prior to Admission medications   Medication  Sig Start Date End Date Taking? Authorizing Provider  acetaminophen (TYLENOL) 325 MG tablet Take 2 tablets (650 mg total) by mouth every 6 (six) hours. 04/25/22   Almon Hercules, MD  albuterol (VENTOLIN HFA) 108 (90 Base) MCG/ACT inhaler Inhale 2 puffs into the lungs every 6 (six) hours as needed for wheezing or shortness of breath. 08/12/19   Salena Saner, MD  amLODipine (NORVASC) 5 MG tablet Take 1 tablet (5 mg total) by mouth daily. 04/26/22   Almon Hercules, MD  apixaban (ELIQUIS) 2.5 MG TABS tablet Take 1 tablet (2.5 mg total) by mouth 2 (two) times daily. 05/31/21   McLean-Scocuzza, Pasty Spillers, MD  atorvastatin (LIPITOR) 80 MG tablet Take by mouth. 03/25/23   [provider]  carvedilol (COREG) 12.5 MG tablet Take 1 tablet (12.5 mg total) by mouth 2 (two) times daily with a meal. 04/25/22   Almon Hercules, MD  cholecalciferol (VITAMIN D) 1000 UNITS tablet Take 1,000 Units by mouth daily.    [provider]  DULoxetine (CYMBALTA) 30 MG capsule Take 30 mg by mouth 2 (two) times daily.    [provider]  finasteride (PROSCAR) 5 MG tablet Take 5 mg by mouth daily. Patient not taking: Reported on 10/10/2021    [provider]  hydrALAZINE (APRESOLINE) 25 MG tablet Take 1 tablet (25 mg total) by mouth every 6 (six) hours as needed (SBP>160 or DBP>110). 04/25/22   Almon Hercules, MD  lisinopril (ZESTRIL) 10 MG tablet Take by mouth. 03/25/23   [provider]  LYCOPENE PO Take 2 tablets by mouth daily.    [provider]  metoprolol tartrate (LOPRESSOR) 25 MG tablet Take by mouth. 03/25/23   [provider]  Multiple Vitamins-Minerals (MEGA MULTIVITAMIN FOR MEN) TABS Take 1 tablet by mouth daily.    [provider]  polyethylene glycol (MIRALAX / GLYCOLAX) 17 g packet Take 17 g by mouth 2 (two) times daily as needed for mild constipation. 04/25/22   Almon Hercules, MD  senna-docusate (SENOKOT-S) 8.6-50 MG tablet Take 1 tablet by mouth 2  (two) times daily between meals as needed for mild constipation. 04/25/22   Almon Hercules, MD  sodium phosphate (FLEET) 7-19 GM/118ML ENEM Place 133 mLs (1 enema total) rectally daily as needed for severe constipation. 04/25/22   Almon Hercules, MD  tamsulosin (FLOMAX) 0.4 MG CAPS capsule Take by mouth. 03/25/23   [provider]  Tiotropium Bromide-Olodaterol 2.5-2.5 MCG/ACT AERS Inhale 2 puffs into the lungs daily. 02/28/20   McLean-Scocuzza, Pasty Spillers, MD  valACYclovir (VALTREX) 500 MG tablet Take by mouth. 12/26/17   [provider]    Physical Exam: Vitals:   09/22/23 1829 09/22/23 2200 09/22/23 2202 09/23/23 0043  BP: (!) 190/93 (!) 196/74 (!) 196/74 (!) 220/81  Pulse: 84 74 70 60  Resp: 18 18 (!) 22 (!)  21  Temp: 97.6 F (36.4 C) 98.8 F (37.1 C)    TempSrc: Oral Oral    SpO2: 91% 99% 95% 94%  Weight:      Height:       Physical Exam Vitals and nursing note reviewed.  Constitutional:      General: He is not in acute distress.    Interventions: Nasal cannula in place.     Comments: Patient sitting with head of bed at 90 degrees  HENT:     Head: Normocephalic and atraumatic.  Cardiovascular:     Rate and Rhythm: Normal rate and regular rhythm.     Heart sounds: Normal heart sounds.  Pulmonary:     Effort: Pulmonary effort is normal.     Breath sounds: Normal breath sounds.  Abdominal:     Palpations: Abdomen is soft.     Tenderness: There is no abdominal tenderness.  Musculoskeletal:     Right lower leg: No edema.     Left lower leg: No edema.  Neurological:     Mental Status: Mental status is at baseline.     Labs on Admission: I have personally reviewed following labs and imaging studies  CBC: Recent Labs  Lab 09/22/23 1828  WBC 11.7*  HGB 10.4*  HCT 32.9*  MCV 90.6  PLT 184   Basic Metabolic Panel: Recent Labs  Lab 09/22/23 1828  NA 142  K 3.5  CL 109  CO2 24  GLUCOSE 135*  BUN 17  CREATININE 1.23  CALCIUM 8.8*   GFR: Estimated  Creatinine Clearance: 46.9 mL/min (by C-G formula based on SCr of 1.23 mg/dL). Liver Function Tests: No results for input(s): "AST", "ALT", "ALKPHOS", "BILITOT", "PROT", "ALBUMIN" in the last 168 hours. No results for input(s): "LIPASE", "AMYLASE" in the last 168 hours. No results for input(s): "AMMONIA" in the last 168 hours. Coagulation Profile: Recent Labs  Lab 09/22/23 1828  INR 1.1   Cardiac Enzymes: No results for input(s): "CKTOTAL", "CKMB", "CKMBINDEX", "TROPONINI" in the last 168 hours. BNP (last 3 results) No results for input(s): "PROBNP" in the last 8760 hours. HbA1C: No results for input(s): "HGBA1C" in the last 72 hours. CBG: No results for input(s): "GLUCAP" in the last 168 hours. Lipid Profile: No results for input(s): "CHOL", "HDL", "LDLCALC", "TRIG", "CHOLHDL", "LDLDIRECT" in the last 72 hours. Thyroid Function Tests: No results for input(s): "TSH", "T4TOTAL", "FREET4", "T3FREE", "THYROIDAB" in the last 72 hours. Anemia Panel: No results for input(s): "VITAMINB12", "FOLATE", "FERRITIN", "TIBC", "IRON", "RETICCTPCT" in the last 72 hours. Urine analysis:    Component Value Date/Time   COLORURINE YELLOW 04/16/2022 0030   APPEARANCEUR HAZY (A) 04/16/2022 0030   APPEARANCEUR Clear 06/22/2020 1008   LABSPEC >1.046 (H) 04/16/2022 0030   PHURINE 5.0 04/16/2022 0030   GLUCOSEU NEGATIVE 04/16/2022 0030   HGBUR SMALL (A) 04/16/2022 0030   BILIRUBINUR NEGATIVE 04/16/2022 0030   BILIRUBINUR Negative 06/22/2020 1008   KETONESUR NEGATIVE 04/16/2022 0030   PROTEINUR 100 (A) 04/16/2022 0030   UROBILINOGEN 0.2 05/05/2013 1134   NITRITE NEGATIVE 04/16/2022 0030   LEUKOCYTESUR NEGATIVE 04/16/2022 0030    Radiological Exams on Admission: DG Chest 2 View  Result Date: 09/22/2023 CLINICAL DATA:  Chest pain short of breath EXAM: CHEST - 2 VIEW COMPARISON:  06/13/2022, CT 04/15/2022 FINDINGS: Small bilateral effusions. Cardiomegaly with mild diffuse interstitial opacity.  Irregular left apical opacity corresponding to prior nodule and possible post treatment changes. This appears more confluent in the interim. IMPRESSION: 1. Cardiomegaly with mild diffuse  interstitial opacity and small bilateral effusions, suspect for CHF. 2. Irregular left apical opacity corresponding to prior nodule and post treatment changes but radiographically appears more dense and confluent,, suggest correlation with chest CT. Electronically Signed   By: Jasmine Pang M.D.   On: 09/22/2023 22:35     Data Reviewed: Relevant notes from primary care and specialist visits, past discharge summaries as available in EHR, including Care Everywhere. Prior diagnostic testing as pertinent to current admission diagnoses Updated medications and problem lists for reconciliation ED course, including vitals, labs, imaging, treatment and response to treatment Triage notes, nursing and pharmacy notes and ED provider's notes Notable results as noted in HPI   Assessment and Plan: Acute CHF (congestive heart failure) (HCC) Hypertensive emergency Patient presents with 3 days of shortness of breath, BNP over 400, chest x-ray consistent with CHF IV Lasix Continue home antihypertensives including metoprolol, lisinopril, hydralazine, carvedilol and amlodipine  Echocardiogram Daily weights with intake and output monitoring Cardiology consult  Coronary artery disease involving native coronary artery of native heart without angina pectoris Elevated troponin Denies chest pain and EKG nonacute Suspect demand ischemia Continue metoprolol, atorvastatin and apixaban Trend troponin  Atrial fibrillation, chronic (HCC) Chronic anticoagulation Continue Eliquis and metoprolol  COPD Not acutely exacerbated Continue home inhalers with DuoNebs as needed  CLL Followed at the [place] Daughter states that they are getting ready to start him on a new medication     DVT prophylaxis: Eliquis  Consults: [facility]  cardiology  Advance Care Planning:   Code Status: Prior   Family Communication: Daughter at bedside  Disposition Plan: Back to previous home environment  Severity of Illness: The appropriate patient status for this patient is INPATIENT. Inpatient status is judged to be reasonable and necessary in order to provide the required intensity of service to ensure the patient's safety. The patient's presenting symptoms, physical exam findings, and initial radiographic and laboratory data in the context of their chronic comorbidities is felt to place them at high risk for further clinical deterioration. Furthermore, it is not anticipated that the patient will be medically stable for discharge from the hospital within 2 midnights of admission.   * I certify that at the point of admission it is my clinical judgment that the patient will require inpatient hospital care spanning beyond 2 midnights from the point of admission due to high intensity of service, high risk for further deterioration and high frequency of surveillance required.*  Author: Andris Baumann, MD 09/23/2023 1:55 AM  For on call review [email or web address].

## 2023-09-23 NOTE — ED Notes (Addendum)
Rn in room to recycle BP. Pt BP cuff and oxygen off. BP cuff and oxygen placed back on pt. Pt RA sats were note to be 100%.

## 2023-09-23 NOTE — ED Notes (Signed)
RN in room. Pt blankets on floor and pt pulling at monitor. Pt redirected to environment and situation. New covers placed on pt. Pt instructed to leave monitoring cords in place.

## 2023-09-23 NOTE — ED Notes (Addendum)
This EDT went into pts room and found pt to be de-clothed standing next to the bed urinating in the floor. This EDT directed pt to sit down since this EDT noticed pt having trouble breathing. Charge nurse called immediatly to room. Pt was assisted to getting back into bed once charge nurse, Erie Noe was in the room along with Raynelle Fanning, Charity fundraiser. New chucks pads placed on bed, new brief placed on pt. Urine cleaned up out of floor. Pt was hooked back up to cardiac monitoring and call bell was left within reach.

## 2023-09-23 NOTE — ED Notes (Signed)
Dr Para March and Geradine Girt, NP at bedside and updated on pt status.  Informed Dr Para March of last BP at 0500 185/87 and VORB and verified okay to give Lasix 40 mg which is the 2nd dose tonight for total of 80 mg of Lasix.

## 2023-09-23 NOTE — ED Notes (Signed)
2L of oxygen placed on patient for comfort per request

## 2023-09-23 NOTE — Assessment & Plan Note (Signed)
Followed at the [facility] Daughter states that they are getting ready to start him on a new medication

## 2023-09-23 NOTE — ED Notes (Addendum)
Social work, Physicist, medical, at bedside

## 2023-09-23 NOTE — Assessment & Plan Note (Addendum)
Chronic anticoagulation Continue Eliquis and metoprolol

## 2023-09-23 NOTE — ED Notes (Addendum)
Writer called by EDT to come to pts room STAT. Pt found to be de-clothed, sitting on bedside bench seating with only his personal socks on and obvious urine below him on floor and down bilateral legs. Pt hypoxic, labored and pursed lip breathing. Pt without his nasal cannula in place nor monitoring devices. Nasal cannulated oxygen replaced on pt, non-slip socks applied, bed slid closer for safe transition back into bed. Pt assisted to bed with new brief applied. Pt not tolerating or keeping nasal cannula in place and having difficulty speaking in complete sentences. Non-Re-breather placed on pt at this time and pt repositioned upright with coaching of slow deep breaths. Fall alarm turned on and activated at high volume, yellow fall bracelet applied on pts wrist. Pts bed lowered to lowest position with side rails in upward position and call bell education provided. Fall magnet on outside door frame and pts door remained open. Pt demonstrating AMS during this time and can not remember or recall getting up. Assigned RN prompted to call Attending MD to inform of pts status and current episode.

## 2023-09-23 NOTE — Progress Notes (Signed)
Progress Note    Donald [name]  EXB:[id number] DOB: 11-08-1933  DOA: 09/22/2023 PCP: Ricky Ala, NP      Brief Narrative:    Medical records reviewed and are as summarized below:  Donald [name] is a 89 y.o. male  with medical history significant for COPD, no longer on home O2, HTN, A flutter on Eliquis, CLL, lung cancer s/p radiation, right ICA stenosis, BPH, paronychia and onychomycosis of left great toe, who presented to the hospital with right-sided chest pain and shortness of breath with exertion for about 3 days duration.  He also reported swelling in his legs.  Reportedly, oxygen saturation dropped into the 80s.  In the ED, he was afebrile, BP was up to 220/81, tachypneic with respiratory rate between 20 and 30, oxygen saturation went down to 85% on room air He was admitted to the hospital for acute CHF exacerbation.     Assessment/Plan:   Principal Problem:   Hypertensive emergency Active Problems:   Acute CHF (congestive heart failure) (HCC)   Coronary artery disease involving native coronary artery of native heart without angina pectoris   Elevated troponin   COPD   Atrial fibrillation, chronic (HCC)   Long term current use of anticoagulant   CLL    Body mass index is 32.54 kg/m.  (Obesity)   Acute CHF exacerbation: Continue IV Lasix.  Monitor BMP, daily weight and urine output.  Consulted cardiologist to assist with management. BNP 419. 2D echo is pending.   Right-sided chest pain, mildly elevated troponins, underlying CAD: Chest pain has resolved.  Elevated troponins likely due to demand ischemia. Patient was evaluated by the cardiologist.  No plan for cardiac catheterization.   Hypertensive emergency: Patient was started on IV nitroglycerin infusion for BP control.  Wean off as able.   Acute hypoxemic respiratory failure: He is on 2 L/min oxygen via nasal cannula.  Wean off oxygen as able.   Atrial flutter: Patient with known  history of atrial fibrillation and atrial flutter. Continue Eliquis and metoprolol   Acute metabolic encephalopathy: Continue supportive care   COPD: Compensated.  Continue bronchodilators   Chronic lymphocytic leukemia: Apparently he is going to be started on a new medication. Follow-up with oncologist at the [facility] clinic.     Diet Order             Diet Heart Room service appropriate? Yes; Fluid consistency: Thin  Diet effective now                            Consultants: Cardiologist  Procedures: None    Medications:    apixaban  2.5 mg Oral BID   atorvastatin  80 mg Oral Daily   furosemide  40 mg Intravenous Q12H   lisinopril  10 mg Oral Daily   metoprolol tartrate  25 mg Oral BID   tamsulosin  0.4 mg Oral Daily   Continuous Infusions:  nitroGLYCERIN 5 mcg/min (09/23/23 0858)     Anti-infectives (From admission, onward)    None              Family Communication/Anticipated D/C date and plan/Code Status   DVT prophylaxis: apixaban (ELIQUIS) tablet 2.5 mg Start: 09/23/23 0800 apixaban (ELIQUIS) tablet 2.5 mg     Code Status: Full Code  Family Communication: None Disposition Plan: Plan to discharge home   Status is: Inpatient Remains inpatient appropriate because: CHF exacerbation  Subjective:   Interval events noted.  He said he feels better.  However, he is confused and cannot provide an adequate history.  Objective:    Vitals:   09/23/23 0520 09/23/23 0753 09/23/23 0830 09/23/23 0900  BP: (!) 162/99 (!) 202/89 (!) 180/69 (!) 162/67  Pulse: 78 98 (!) 59 88  Resp: (!) 26 (!) 31 (!) 25 (!) 25  Temp: 98.6 F (37 C)   98.5 F (36.9 C)  TempSrc: Oral   Oral  SpO2: 100% 98% 100% 98%  Weight:      Height:       No data found.   Intake/Output Summary (Last 24 hours) at 09/23/2023 0916 Last data filed at 09/23/2023 0615 Gross per 24 hour  Intake --  Output 2100 ml  Net -2100 ml   Filed Weights    09/22/23 1826  Weight: 97.1 kg    Exam:  GEN: NAD SKIN: Warm and dry EYES: EOMI ENT: MMM CV: RRR PULM: Bilateral rhonchi ABD: soft, ND, NT, +BS CNS: AAO x 1 (person), non focal EXT: Mild bilateral leg edema.  No tenderness or erythema        Data Reviewed:   I have personally reviewed following labs and imaging studies:  Labs: Labs show the following:   Basic Metabolic Panel: Recent Labs  Lab 09/22/23 1828  NA 142  K 3.5  CL 109  CO2 24  GLUCOSE 135*  BUN 17  CREATININE 1.23  CALCIUM 8.8*   GFR Estimated Creatinine Clearance: 46.9 mL/min (by C-G formula based on SCr of 1.23 mg/dL). Liver Function Tests: No results for input(s): "AST", "ALT", "ALKPHOS", "BILITOT", "PROT", "ALBUMIN" in the last 168 hours. No results for input(s): "LIPASE", "AMYLASE" in the last 168 hours. No results for input(s): "AMMONIA" in the last 168 hours. Coagulation profile Recent Labs  Lab 09/22/23 1828  INR 1.1    CBC: Recent Labs  Lab 09/22/23 1828  WBC 11.7*  HGB 10.4*  HCT 32.9*  MCV 90.6  PLT 184   Cardiac Enzymes: No results for input(s): "CKTOTAL", "CKMB", "CKMBINDEX", "TROPONINI" in the last 168 hours. BNP (last 3 results) No results for input(s): "PROBNP" in the last 8760 hours. CBG: No results for input(s): "GLUCAP" in the last 168 hours. D-Dimer: No results for input(s): "DDIMER" in the last 72 hours. Hgb A1c: No results for input(s): "HGBA1C" in the last 72 hours. Lipid Profile: No results for input(s): "CHOL", "HDL", "LDLCALC", "TRIG", "CHOLHDL", "LDLDIRECT" in the last 72 hours. Thyroid function studies: No results for input(s): "TSH", "T4TOTAL", "T3FREE", "THYROIDAB" in the last 72 hours.  Invalid input(s): "FREET3" Anemia work up: No results for input(s): "VITAMINB12", "FOLATE", "FERRITIN", "TIBC", "IRON", "RETICCTPCT" in the last 72 hours. Sepsis Labs: Recent Labs  Lab 09/22/23 1828  WBC 11.7*    Microbiology No results found for this or  any previous visit (from the past 240 hour(s)).  Procedures and diagnostic studies:  DG Chest 2 View  Result Date: 09/22/2023 CLINICAL DATA:  Chest pain short of breath EXAM: CHEST - 2 VIEW COMPARISON:  06/13/2022, CT 04/15/2022 FINDINGS: Small bilateral effusions. Cardiomegaly with mild diffuse interstitial opacity. Irregular left apical opacity corresponding to prior nodule and possible post treatment changes. This appears more confluent in the interim. IMPRESSION: 1. Cardiomegaly with mild diffuse interstitial opacity and small bilateral effusions, suspect for CHF. 2. Irregular left apical opacity corresponding to prior nodule and post treatment changes but radiographically appears more dense and confluent,, suggest correlation with chest CT. Electronically Signed  By: Jasmine Pang M.D.   On: 09/22/2023 22:35               LOS: 1 day   Eulla Kochanowski  [facility]   Pager on [email or web address]. If 7PM-7AM, please contact night-coverage at [email or web address]     09/23/2023, 9:16 AM

## 2023-09-23 NOTE — Assessment & Plan Note (Addendum)
Hypertensive emergency Patient presents with 3 days of shortness of breath, BNP over 400, chest x-ray consistent with CHF IV Lasix Continue home antihypertensives including metoprolol, lisinopril, hydralazine, carvedilol and amlodipine  Echocardiogram Daily weights with intake and output monitoring Cardiology consult

## 2023-09-23 NOTE — ED Notes (Signed)
This RN at patient's bedside due to patient's BP cuff not reading. This RN found patient's BP cuff laying on floor. Patient confused intermittently. This RN reminded patient that he must leave his BP cuff on because his BP has been high and he is currently in the hospital. Patient verbalized understanding. Call bell and patient belongings currently within reach. Patient wearing non-skid socks and bed alarm is on.
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
## 2023-09-23 NOTE — Plan of Care (Signed)
  Problem: Education: Goal: Ability to demonstrate management of disease process will improve Outcome: Progressing Goal: Ability to verbalize understanding of medication therapies will improve Outcome: Progressing Goal: Individualized Educational Video(s) Outcome: Progressing   Problem: Activity: Goal: Capacity to carry out activities will improve Outcome: Progressing   Problem: Cardiac: Goal: Ability to achieve and maintain adequate cardiopulmonary perfusion will improve Outcome: Progressing   Problem: Education: Goal: Knowledge of General Education information will improve Description: Including pain rating scale, medication(s)/side effects and non-pharmacologic comfort measures Outcome: Progressing   Problem: Health Behavior/Discharge Planning: Goal: Ability to manage health-related needs will improve Outcome: Progressing   Problem: Clinical Measurements: Goal: Ability to maintain clinical measurements within normal limits will improve Outcome: Progressing Goal: Will remain free from infection Outcome: Progressing Goal: Diagnostic test results will improve Outcome: Progressing Goal: Respiratory complications will improve Outcome: Progressing Goal: Cardiovascular complication will be avoided Outcome: Progressing   Problem: Activity: Goal: Risk for activity intolerance will decrease Outcome: Progressing   Problem: Nutrition: Goal: Adequate nutrition will be maintained Outcome: Progressing   Problem: Coping: Goal: Level of anxiety will decrease Outcome: Progressing   Problem: Elimination: Goal: Will not experience complications related to bowel motility Outcome: Progressing Goal: Will not experience complications related to urinary retention Outcome: Progressing   Problem: Pain Management: Goal: General experience of comfort will improve Outcome: Progressing   Problem: Safety: Goal: Ability to remain free from injury will improve Outcome: Progressing    Problem: Skin Integrity: Goal: Risk for impaired skin integrity will decrease Outcome: Progressing

## 2023-09-23 NOTE — ED Notes (Signed)
PRN medication given for BP. Pt called out x2 for urge to urinate. Due to high fall risk and staffing RN placed male primofit on pt. Pt provided breakfast tray. TV turned on and changed to requested channel. Pt denies any further needs at this time.

## 2023-09-24 ENCOUNTER — Encounter: Payer: Self-pay | Admitting: Internal Medicine

## 2023-09-24 ENCOUNTER — Inpatient Hospital Stay
Admit: 2023-09-24 | Discharge: 2023-09-24 | Disposition: A | Discharge status: Home / Self Care | Payer: No Typology Code available for payment source | Attending: Student

## 2023-09-24 DIAGNOSIS — I161 Hypertensive emergency: Secondary | ICD-10-CM | POA: Diagnosis not present

## 2023-09-24 LAB — BASIC METABOLIC PANEL
Anion gap: 14 (ref 5–15)
BUN: 24 mg/dL — ABNORMAL HIGH (ref 8–23)
CO2: 24 mmol/L (ref 22–32)
Calcium: 8.9 mg/dL (ref 8.9–10.3)
Chloride: 102 mmol/L (ref 98–111)
Creatinine, Ser: 1.41 mg/dL — ABNORMAL HIGH (ref 0.61–1.24)
GFR, Estimated: 48 mL/min — ABNORMAL LOW (ref >60)
Glucose, Bld: 120 mg/dL — ABNORMAL HIGH (ref 70–99)
Potassium: 3.5 mmol/L (ref 3.5–5.1)
Sodium: 140 mmol/L (ref 135–145)

## 2023-09-24 LAB — CBC WITH DIFFERENTIAL/PLATELET
Abs Immature Granulocytes: 0.04 10*3/uL (ref 0.00–0.07)
Basophils Absolute: 0.1 10*3/uL (ref 0.0–0.1)
Basophils Relative: 0 %
Eosinophils Absolute: 0.2 10*3/uL (ref 0.0–0.5)
Eosinophils Relative: 2 %
HCT: 38.6 % — ABNORMAL LOW (ref 39.0–52.0)
Hemoglobin: 12.4 g/dL — ABNORMAL LOW (ref 13.0–17.0)
Immature Granulocytes: 0 %
Lymphocytes Relative: 44 %
Lymphs Abs: 5.7 10*3/uL — ABNORMAL HIGH (ref 0.7–4.0)
MCH: 28.4 pg (ref 26.0–34.0)
MCHC: 32.1 g/dL (ref 30.0–36.0)
MCV: 88.5 fL (ref 80.0–100.0)
Monocytes Absolute: 1.3 10*3/uL — ABNORMAL HIGH (ref 0.1–1.0)
Monocytes Relative: 10 %
Neutro Abs: 5.8 10*3/uL (ref 1.7–7.7)
Neutrophils Relative %: 44 %
Platelets: 223 10*3/uL (ref 150–400)
RBC: 4.36 MIL/uL (ref 4.22–5.81)
RDW: 15.9 % — ABNORMAL HIGH (ref 11.5–15.5)
WBC: 13.1 10*3/uL — ABNORMAL HIGH (ref 4.0–10.5)
nRBC: 0 % (ref 0.0–0.2)

## 2023-09-24 LAB — ECHOCARDIOGRAM COMPLETE
AR max vel: 2.03 cm2
AV Area VTI: 2.12 cm2
AV Area mean vel: 2.16 cm2
AV Mean grad: 5 mm[Hg]
AV Peak grad: 10.3 mm[Hg]
Ao pk vel: 1.61 m/s
Area-P 1/2: 4.99 cm2
Height: 68 in
MV VTI: 2.76 cm2
S' Lateral: 2.7 cm
Weight: 3424 [oz_av]

## 2023-09-24 MED ORDER — AMLODIPINE BESYLATE 5 MG PO TABS
5.0000 mg | ORAL_TABLET | Freq: Every day | ORAL | Status: DC
  Administered 2023-09-24: 5 mg via ORAL
  Filled 2023-09-24: qty 1

## 2023-09-24 MED ORDER — HYDRALAZINE HCL 20 MG/ML IJ SOLN
10.0000 mg | Freq: Once | INTRAMUSCULAR | Status: AC
  Administered 2023-09-24: 10 mg via INTRAVENOUS
  Filled 2023-09-24: qty 1

## 2023-09-24 MED ORDER — FUROSEMIDE 10 MG/ML IJ SOLN
40.0000 mg | Freq: Two times a day (BID) | INTRAMUSCULAR | Status: AC
  Administered 2023-09-24: 40 mg via INTRAVENOUS
  Filled 2023-09-24: qty 4

## 2023-09-24 MED ORDER — POTASSIUM CHLORIDE CRYS ER 20 MEQ PO TBCR
40.0000 meq | EXTENDED_RELEASE_TABLET | Freq: Once | ORAL | Status: AC
  Administered 2023-09-24: 40 meq via ORAL
  Filled 2023-09-24: qty 2
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
## 2023-09-24 NOTE — Discharge Instructions (Signed)

## 2023-09-24 NOTE — Plan of Care (Signed)
  Problem: Clinical Measurements: Goal: Respiratory complications will improve Outcome: Progressing   Problem: Clinical Measurements: Goal: Cardiovascular complication will be avoided Outcome: Progressing   Problem: Elimination: Goal: Will not experience complications related to bowel motility Outcome: Progressing   Problem: Elimination: Goal: Will not experience complications related to urinary retention Outcome: Progressing   Problem: Pain Management: Goal: General experience of comfort will improve Outcome: Progressing   Problem: Safety: Goal: Ability to remain free from injury will improve Outcome: Progressing

## 2023-09-24 NOTE — Progress Notes (Signed)
Pt has been pulling off the tele leads, purewick, hospital gown off all night. He is also trying to get out of bed. Redirectable but only for a very short amount of time. Pt's blood pressure was also elevated. PRN Hydralazine PO ineffective. NP Jawo informed via secure chat. PRN IV Hydalazine was ordered and given. Unsure if the medication is effective because pt continues to be restless, pulling at equipment and trying to get out of bed.

## 2023-09-24 NOTE — Progress Notes (Signed)
[facility] CLINIC CARDIOLOGY PROGRESS NOTE       Patient ID: Donald [name] MRN: [id number] DOB/AGE: [birth year] 89 y.o.  Admit date: 09/22/2023 Referring Physician Dr. Lurene Shadow Primary Physician Ricky Ala, NP  Primary Cardiologist Dr. Gwen Pounds (last seen 2022) Reason for Consultation acute heart failure, elevated troponins  HPI: Donald [name] is a 89 y.o. male  with a past medical history of coronary artery disease by CT, atrial fibrillation/flutter, COPD, CLL, peripheral vascular disease, hypertension, hyperlipidemia who presented to the ED on 09/22/2023 for shortness of breath and chest pain. Cardiology was consulted for further evaluation.   Interval history: -Patient reports he feels better overall this AM.  -Weaned to room air. Reports SOB is improving.  -Cr slightly increased this AM, reports good UOP. Urine clear. -BP elevated, he was weaned off of nitroglycerin gtt yesterday.  Review of systems complete and found to be negative unless listed above    Past Medical History:  Diagnosis Date   Anemia    Arthritis    BPH (benign prostatic hyperplasia)    Cancer (HCC)    Lung   COPD (chronic obstructive pulmonary disease) (HCC)    Emphysema of lung (HCC)    Emphysema of lung (HCC)    Esophageal reflux    Hyperlipidemia    Hypertension    Internal carotid artery stenosis, right    Neuropathy of both feet    Pneumonia    recurrent   PTSD (post-traumatic stress disorder)    [place] vet   Sebaceous cyst    SOB (shortness of breath) on exertion    TIA (transient ischemic attack)    Urge incontinence     Past Surgical History:  Procedure Laterality Date   CYST EXCISION     buttocks   ELECTROMAGNETIC NAVIGATION BROCHOSCOPY Left 08/23/2019   Procedure: ELECTROMAGNETIC NAVIGATION BRONCHOSCOPY;  Surgeon: Salena Saner, MD;  Location: [facility] ORS;  Service: Cardiopulmonary;  Laterality: Left;   ENDOBRONCHIAL ULTRASOUND N/A 08/23/2019   Procedure:  ENDOBRONCHIAL ULTRASOUND;  Surgeon: Salena Saner, MD;  Location: [facility] ORS;  Service: Cardiopulmonary;  Laterality: N/A;   HEMORRHOID SURGERY     shave biopsy  03/13/2020   left neck 03/13/20 Lucien Derm Mikki Santee wart with atypica inflamed +AK no carcinoma    SINUSOTOMY     VIDEO BRONCHOSCOPY WITH ENDOBRONCHIAL NAVIGATION Left 06/26/2020   Procedure: VIDEO BRONCHOSCOPY WITH ENDOBRONCHIAL NAVIGATION;  Surgeon: Salena Saner, MD;  Location: [facility] ORS;  Service: Pulmonary;  Laterality: Left;    Medications Prior to Admission  Medication Sig Dispense Refill Last Dose   acetaminophen (TYLENOL) 325 MG tablet Take 2 tablets (650 mg total) by mouth every 6 (six) hours.   prn at unk   albuterol (VENTOLIN HFA) 108 (90 Base) MCG/ACT inhaler Inhale 2 puffs into the lungs every 6 (six) hours as needed for wheezing or shortness of breath. 18 g 6 prn at unk   apixaban (ELIQUIS) 2.5 MG TABS tablet Take 1 tablet (2.5 mg total) by mouth 2 (two) times daily.   09/22/2023   atorvastatin (LIPITOR) 80 MG tablet Take 40 mg by mouth at bedtime.   Past Week   DULoxetine (CYMBALTA) 30 MG capsule Take 30 mg by mouth 2 (two) times daily.   09/22/2023   famotidine (PEPCID) 20 MG tablet Take 20 mg by mouth daily as needed for indigestion or heartburn.   prn at unk   finasteride (PROSCAR) 5 MG tablet Take 5 mg by mouth daily.  09/22/2023   guaifenesin (HUMIBID E) 400 MG TABS tablet Take 400 mg by mouth 2 (two) times daily as needed (cough).   prn at unk   ipratropium (ATROVENT) 0.03 % nasal spray Place 2 sprays into both nostrils daily.   09/22/2023   lisinopril (ZESTRIL) 10 MG tablet Take 5 mg by mouth daily.   09/22/2023   metoprolol tartrate (LOPRESSOR) 25 MG tablet Take 25 mg by mouth 2 (two) times daily.   09/22/2023   senna-docusate (SENOKOT-S) 8.6-50 MG tablet Take 1 tablet by mouth 2 (two) times daily between meals as needed for mild constipation. 60 tablet 0 prn at unk   tamsulosin (FLOMAX) 0.4 MG CAPS  capsule Take 0.4 mg by mouth every evening.   Past Week   Tiotropium Bromide-Olodaterol 2.5-2.5 MCG/ACT AERS Inhale 2 puffs into the lungs daily.   09/22/2023   valACYclovir (VALTREX) 500 MG tablet Take 500 mg by mouth every other day.   Past Week   amLODipine (NORVASC) 5 MG tablet Take 1 tablet (5 mg total) by mouth daily. (Patient not taking: Reported on 09/23/2023)   Not Taking   carvedilol (COREG) 12.5 MG tablet Take 1 tablet (12.5 mg total) by mouth 2 (two) times daily with a meal. (Patient not taking: Reported on 09/23/2023)   Not Taking   cholecalciferol (VITAMIN D) 1000 UNITS tablet Take 1,000 Units by mouth daily. (Patient not taking: Reported on 09/23/2023)   Not Taking   hydrALAZINE (APRESOLINE) 25 MG tablet Take 1 tablet (25 mg total) by mouth every 6 (six) hours as needed (SBP>160 or DBP>110). (Patient not taking: Reported on 09/23/2023)   Not Taking   LYCOPENE PO Take 2 tablets by mouth daily. (Patient not taking: Reported on 09/23/2023)   Not Taking   Multiple Vitamins-Minerals (MEGA MULTIVITAMIN FOR MEN) TABS Take 1 tablet by mouth daily.   unk   polyethylene glycol (MIRALAX / GLYCOLAX) 17 g packet Take 17 g by mouth 2 (two) times daily as needed for mild constipation. (Patient not taking: Reported on 09/23/2023) 14 each 0 Not Taking   sodium phosphate (FLEET) 7-19 GM/118ML ENEM Place 133 mLs (1 enema total) rectally daily as needed for severe constipation. (Patient not taking: Reported on 09/23/2023)  0 Not Taking   Social History   Socioeconomic History   Marital status: Married    Spouse name: Donald [name]   Number of children: 2   Years of education: Not on file   Highest education level: Not on file  Occupational History   Not on file  Tobacco Use   Smoking status: Former    Current packs/day: 0.00    Average packs/day: 1.5 packs/day for 50.0 years (75.0 ttl pk-yrs)    Types: Cigarettes    Start date: 09/09/1949    Quit date: 09/10/1999    Years since quitting:  24.0   Smokeless tobacco: Never  Vaping Use   Vaping status: Never Used  Substance and Sexual Activity   Alcohol use: No    Alcohol/week: 0.0 standard drinks of alcohol    Comment: not since 12/1980   Drug use: No   Sexual activity: Yes  Other Topics Concern   Not on file  Social History Narrative   Lives in [place] with wife. Dog and cat in home.      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  Recruitment consultant.      Diet - regular      Exercise - Walking   Social Determinants of Health  Financial Resource Strain: Low Risk  (10/21/2018)   Overall Financial Resource Strain (CARDIA)    Difficulty of Paying Living Expenses: Not hard at all  Food Insecurity: No Food Insecurity (10/21/2018)   Hunger Vital Sign    Worried About Running Out of Food in the Last Year: Never true    Ran Out of Food in the Last Year: Never true  Transportation Needs: No Transportation Needs (05/24/2020)   PRAPARE - Administrator, Civil Service (Medical): No    Lack of Transportation (Non-Medical): No  Physical Activity: Insufficiently Active (05/24/2020)   Exercise Vital Sign    Days of Exercise per Week: 5 days    Minutes of Exercise per Session: 20 min  Stress: No Stress Concern Present (05/24/2020)   [employer] of Occupational Health - Occupational Stress Questionnaire    Feeling of Stress : Not at all  Social Connections: Unknown (05/24/2020)   Social Connection and Isolation Panel [NHANES]    Frequency of Communication with Friends and Family: Not on file    Frequency of Social Gatherings with Friends and Family: Not on file    Attends Religious Services: Not on file    Active Member of Clubs or Organizations: Not on file    Attends Banker Meetings: Not on file    Marital Status: Married  Intimate Partner Violence: Not At Risk (05/24/2020)   Humiliation, Afraid, Rape, and Kick questionnaire    Fear of Current or Ex-Partner: No    Emotionally Abused: No     Physically Abused: No    Sexually Abused: No    Family History  Problem Relation Age of Onset   Heart disease Mother    Cancer Maternal Aunt        lung   Lung cancer Maternal Aunt    Leukemia Maternal Aunt    Hypertension Son      Vitals:   09/24/23 0337 09/24/23 0445 09/24/23 0505 09/24/23 0705  BP: (!) 187/65 (!) 198/73 (!) 171/69   Pulse:  77 75   Resp:  20  20  Temp:    98.5 F (36.9 C)  TempSrc:    Oral  SpO2:      Weight:      Height:        PHYSICAL EXAM General: Chronically ill-appearing, well nourished, in no acute distress. HEENT: Normocephalic and atraumatic. Neck: No JVD.  Lungs: Normal respiratory effort on RA. Clear bilaterally to auscultation. + Rhonchi.  No wheezing. Heart: Irregularly irregular, controlled rate. Normal S1 and S2 without gallops or murmurs.  Abdomen: Non-distended appearing.  Msk: Normal strength and tone for age. Extremities: Warm and well perfused. No clubbing, cyanosis.  Trace edema.  Neuro: Alert and oriented X 3. Psych: Answers questions appropriately.   Labs: Basic Metabolic Panel: Recent Labs    09/22/23 1828 09/24/23 0410  NA 142 140  K 3.5 3.5  CL 109 102  CO2 24 24  GLUCOSE 135* 120*  BUN 17 24*  CREATININE 1.23 1.41*  CALCIUM 8.8* 8.9   Liver Function Tests: No results for input(s): "AST", "ALT", "ALKPHOS", "BILITOT", "PROT", "ALBUMIN" in the last 72 hours. No results for input(s): "LIPASE", "AMYLASE" in the last 72 hours. CBC: Recent Labs    09/22/23 1828 09/24/23 0410  WBC 11.7* 13.1*  NEUTROABS  --  5.8  HGB 10.4* 12.4*  HCT 32.9* 38.6*  MCV 90.6 88.5  PLT 184 223   Cardiac Enzymes: Recent Labs    09/22/23  1828 09/22/23 2215  TROPONINIHS 74* 81*   BNP: Recent Labs    09/22/23 2215  BNP 419.0*   D-Dimer: No results for input(s): "DDIMER" in the last 72 hours. Hemoglobin A1C: No results for input(s): "HGBA1C" in the last 72 hours. Fasting Lipid Panel: No results for input(s): "CHOL",  "HDL", "LDLCALC", "TRIG", "CHOLHDL", "LDLDIRECT" in the last 72 hours. Thyroid Function Tests: No results for input(s): "TSH", "T4TOTAL", "T3FREE", "THYROIDAB" in the last 72 hours.  Invalid input(s): "FREET3" Anemia Panel: No results for input(s): "VITAMINB12", "FOLATE", "FERRITIN", "TIBC", "IRON", "RETICCTPCT" in the last 72 hours.   Radiology: DG Chest 2 View  Result Date: 09/22/2023 CLINICAL DATA:  Chest pain short of breath EXAM: CHEST - 2 VIEW COMPARISON:  06/13/2022, CT 04/15/2022 FINDINGS: Small bilateral effusions. Cardiomegaly with mild diffuse interstitial opacity. Irregular left apical opacity corresponding to prior nodule and possible post treatment changes. This appears more confluent in the interim. IMPRESSION: 1. Cardiomegaly with mild diffuse interstitial opacity and small bilateral effusions, suspect for CHF. 2. Irregular left apical opacity corresponding to prior nodule and post treatment changes but radiographically appears more dense and confluent,, suggest correlation with chest CT. Electronically Signed   By: Jasmine Pang M.D.   On: 09/22/2023 22:35    ECHO pending  TELEMETRY reviewed by me 09/24/2023: Atrial flutter rate 90-100s  EKG reviewed by me: Atrial flutter with variable block rate 79 bpm  Data reviewed by me 09/24/2023: last 24h vitals tele labs imaging I/O hospitalist progress note  Principal Problem:   Hypertensive emergency Active Problems:   COPD   CLL   Coronary artery disease involving native coronary artery of native heart without angina pectoris   Atrial fibrillation, chronic (HCC)   Acute CHF (congestive heart failure) (HCC)   Long term current use of anticoagulant   Elevated troponin    ASSESSMENT AND PLAN:  Jontae Schad is a 89 y.o. male  with a past medical history of coronary artery disease by CT, atrial fibrillation/flutter, COPD, CLL, peripheral vascular disease, hypertension, hyperlipidemia who presented to the ED on  09/22/2023 for shortness of breath and chest pain. Cardiology was consulted for further evaluation.   # Acute on chronic HFpEF (EF 50-55% in 03/2021) # Hypertensive emergency # Demand ischemia Patient presented with shortness of breath worsening since Saturday.  BNP 419.  Troponins mildly elevated and flat trending 74 > 81.  EKG with rate controlled atrial flutter.  BP significantly elevated while in the ED.  -Echo pending -Continue IV Lasix 40 mg twice daily -Continue lisinopril 40 mg daily and metoprolol 25 mg twice daily. Amlodipine 10 mg daily added today by primary. -Troponin elevation most consistent with demand/supply mismatch and not ACS in the setting of acute heart failure and hypertensive emergency. -Continue to monitor renal function closely with diuresis.  # Atrial flutter Patient with known history of atrial fibrillation/flutter.  Noted to be in a flutter with controlled rates since admitted. -Continue metoprolol as above for rate control. -Continue Eliquis 2.5 mg twice daily for stroke risk reduction.  Based on criteria patient does not qualify for reduced dose but it appears he has been taking this dose since 2022 or before.   This patient's plan of care was discussed and created with Dr. Juliann Pares and he is in agreement.  Signed: Gale Journey, PA-C  09/24/2023, 10:29 AM [facility] Cardiology

## 2023-09-24 NOTE — Progress Notes (Addendum)
Progress Note    Donald [name]  UJW:[id number] DOB: 05-07-1933  DOA: 09/22/2023 PCP: Ricky Ala, NP      Brief Narrative:    Medical records reviewed and are as summarized below:  Donald [name] is a 89 y.o. male  with medical history significant for COPD, no longer on home O2, HTN, A flutter on Eliquis, CLL, lung cancer s/p radiation, right ICA stenosis, BPH, paronychia and onychomycosis of left great toe, who presented to the hospital with right-sided chest pain and shortness of breath with exertion for about 3 days duration.  He also reported swelling in his legs.  Reportedly, oxygen saturation dropped into the 80s.  In the ED, he was afebrile, BP was up to 220/81, tachypneic with respiratory rate between 20 and 30, oxygen saturation went down to 85% on room air He was admitted to the hospital for acute CHF exacerbation.     Assessment/Plan:   Principal Problem:   Hypertensive emergency Active Problems:   Acute CHF (congestive heart failure) (HCC)   Coronary artery disease involving native coronary artery of native heart without angina pectoris   Elevated troponin   COPD   Atrial fibrillation, chronic (HCC)   Long term current use of anticoagulant   CLL    Body mass index is 32.54 kg/m.  (Obesity)   Acute CHF exacerbation: Continue IV Lasix per cardiology team.  Monitor BMP, daily weight and urine output.  BNP 419. 2D echo is pending.   Right-sided chest pain, mildly elevated troponins, underlying CAD: Chest pain has resolved.  Elevated troponins likely due to demand ischemia. Patient was evaluated by the cardiologist.  No plan for cardiac catheterization.   Hypertensive emergency: IV nitroglycerin drip has been discontinued.  Continue oral antihypertensives (lisinopril, metoprolol).  Resume amlodipine.    Elevated creatinine: Suspect underlying CKD stage IIIa.  Monitor BMP closely.   Acute hypoxemic respiratory failure:  Improved.   Atrial flutter: Patient with known history of atrial fibrillation and atrial flutter. Continue Eliquis and metoprolol   Acute metabolic encephalopathy, underlying memory/cognitive impairment: Continue supportive care   COPD: Compensated.  Continue bronchodilators   Chronic lymphocytic leukemia: His daughter confirmed that he is going to start the new medicine for this but his oncologist wanted to wait until acute issues are over. Follow-up with oncologist at the [facility].   Plan of care was discussed with Donald [name], daughter, at the bedside.  Diet Order             Diet 2 gram sodium Fluid consistency: Thin  Diet effective now                            Consultants: Cardiologist  Procedures: None    Medications:    amLODipine  5 mg Oral Daily   apixaban  2.5 mg Oral BID   atorvastatin  80 mg Oral Daily   furosemide  40 mg Intravenous Q12H   lisinopril  40 mg Oral Daily   metoprolol tartrate  25 mg Oral BID   tamsulosin  0.4 mg Oral Daily   Continuous Infusions:     Anti-infectives (From admission, onward)    None              Family Communication/Anticipated D/C date and plan/Code Status   DVT prophylaxis: apixaban (ELIQUIS) tablet 2.5 mg Start: 09/23/23 0800 apixaban (ELIQUIS) tablet 2.5 mg     Code Status: Full Code  Family Communication: Donald [name], daughter, at the bedside Disposition Plan: Plan to discharge home   Status is: Inpatient Remains inpatient appropriate because: CHF exacerbation       Subjective:   Interval events noted.  He feels better.  No chest pain or shortness of breath at rest.  He has really gotten out of bed.  Donald [name], daughter, was at the bedside.  Objective:    Vitals:   09/24/23 0445 09/24/23 0505 09/24/23 0705 09/24/23 1253  BP: (!) 198/73 (!) 171/69  (!) 114/46  Pulse: 77 75  76  Resp: 20  20 15   Temp:   98.5 F (36.9 C) 98 F (36.7 C)  TempSrc:   Oral   SpO2:    98%   Weight:      Height:       No data found.   Intake/Output Summary (Last 24 hours) at 09/24/2023 1313 Last data filed at 09/24/2023 1046 Gross per 24 hour  Intake 125 ml  Output 1650 ml  Net -1525 ml   Filed Weights   09/22/23 1826  Weight: 97.1 kg    Exam:  GEN: NAD SKIN: Warm and dry EYES:No pallor or icterus ENT: MMM CV: Irregular rate and rhythm. PULM: Mild rhonchi. ABD: soft, ND, NT, +BS CNS: AAO x 1 (person), non focal EXT: No edema or tenderness      Data Reviewed:   I have personally reviewed following labs and imaging studies:  Labs: Labs show the following:   Basic Metabolic Panel: Recent Labs  Lab 09/22/23 1828 09/24/23 0410  NA 142 140  K 3.5 3.5  CL 109 102  CO2 24 24  GLUCOSE 135* 120*  BUN 17 24*  CREATININE 1.23 1.41*  CALCIUM 8.8* 8.9   GFR Estimated Creatinine Clearance: 40.9 mL/min (A) (by C-G formula based on SCr of 1.41 mg/dL (H)). Liver Function Tests: No results for input(s): "AST", "ALT", "ALKPHOS", "BILITOT", "PROT", "ALBUMIN" in the last 168 hours. No results for input(s): "LIPASE", "AMYLASE" in the last 168 hours. No results for input(s): "AMMONIA" in the last 168 hours. Coagulation profile Recent Labs  Lab 09/22/23 1828  INR 1.1    CBC: Recent Labs  Lab 09/22/23 1828 09/24/23 0410  WBC 11.7* 13.1*  NEUTROABS  --  5.8  HGB 10.4* 12.4*  HCT 32.9* 38.6*  MCV 90.6 88.5  PLT 184 223   Cardiac Enzymes: No results for input(s): "CKTOTAL", "CKMB", "CKMBINDEX", "TROPONINI" in the last 168 hours. BNP (last 3 results) No results for input(s): "PROBNP" in the last 8760 hours. CBG: No results for input(s): "GLUCAP" in the last 168 hours. D-Dimer: No results for input(s): "DDIMER" in the last 72 hours. Hgb A1c: No results for input(s): "HGBA1C" in the last 72 hours. Lipid Profile: No results for input(s): "CHOL", "HDL", "LDLCALC", "TRIG", "CHOLHDL", "LDLDIRECT" in the last 72 hours. Thyroid function studies: No  results for input(s): "TSH", "T4TOTAL", "T3FREE", "THYROIDAB" in the last 72 hours.  Invalid input(s): "FREET3" Anemia work up: No results for input(s): "VITAMINB12", "FOLATE", "FERRITIN", "TIBC", "IRON", "RETICCTPCT" in the last 72 hours. Sepsis Labs: Recent Labs  Lab 09/22/23 1828 09/24/23 0410  WBC 11.7* 13.1*    Microbiology No results found for this or any previous visit (from the past 240 hour(s)).  Procedures and diagnostic studies:  DG Chest 2 View  Result Date: 09/22/2023 CLINICAL DATA:  Chest pain short of breath EXAM: CHEST - 2 VIEW COMPARISON:  06/13/2022, CT 04/15/2022 FINDINGS: Small bilateral effusions. Cardiomegaly with mild diffuse interstitial  opacity. Irregular left apical opacity corresponding to prior nodule and possible post treatment changes. This appears more confluent in the interim. IMPRESSION: 1. Cardiomegaly with mild diffuse interstitial opacity and small bilateral effusions, suspect for CHF. 2. Irregular left apical opacity corresponding to prior nodule and post treatment changes but radiographically appears more dense and confluent,, suggest correlation with chest CT. Electronically Signed   By: Jasmine Pang M.D.   On: 09/22/2023 22:35               LOS: 2 days   Keante Urizar  [facility]   Pager on [email or web address]. If 7PM-7AM, please contact night-coverage at [email or web address]     09/24/2023, 1:13 PM

## 2023-09-24 NOTE — Progress Notes (Signed)
*  PRELIMINARY RESULTS* Echocardiogram 2D Echocardiogram has been performed.  Carolyne Fiscal 09/24/2023, 4:33 PM

## 2023-09-24 NOTE — Plan of Care (Signed)
  Problem: Clinical Measurements: Goal: Will remain free from infection Outcome: Progressing   Problem: Nutrition: Goal: Adequate nutrition will be maintained Outcome: Progressing   Problem: Elimination: Goal: Will not experience complications related to urinary retention Outcome: Progressing   Problem: Safety: Goal: Ability to remain free from injury will improve Outcome: Progressing   Problem: Skin Integrity: Goal: Risk for impaired skin integrity will decrease Outcome: Progressing

## 2023-09-24 NOTE — Progress Notes (Signed)
Mittens applied  ° °

## 2023-09-24 NOTE — Progress Notes (Signed)
Education Assessment and Provision:  Detailed education and instructions provided on heart failure disease management including the following:  Signs and symptoms of Heart Failure When to call the physician Importance of daily weights Low sodium diet Fluid restriction Medication management Anticipated future follow-up appointments- within 1 week post hospital discharge.  Patient education given on each of the above topics. Teaching completed with patients daughter Estrellita Ludwig @ the bedside.  Pt sleeping the whole time.  She will share the booklet with his significant other Nettie Elm. Elease Hashimoto acknowledges understanding via teach back method and acceptance of all instructions.  Education Materials:  "Living Better With Heart Failure" Booklet, HF zone tool, & Daily Weight Tracker Tool.  Patient has scale at home: Yes Patient has pill box at home: Yes    Navigator will sign off at this time.  Roxy Horseman, RN, BSN [facility] Heart Failure Navigator Secure Chat Only

## 2023-09-25 ENCOUNTER — Other Ambulatory Visit (HOSPITAL_COMMUNITY): Payer: Self-pay

## 2023-09-25 ENCOUNTER — Inpatient Hospital Stay: Payer: No Typology Code available for payment source

## 2023-09-25 DIAGNOSIS — I161 Hypertensive emergency: Secondary | ICD-10-CM | POA: Diagnosis not present

## 2023-09-25 LAB — CBC WITH DIFFERENTIAL/PLATELET
Abs Immature Granulocytes: 0.05 10*3/uL (ref 0.00–0.07)
Basophils Absolute: 0.1 10*3/uL (ref 0.0–0.1)
Basophils Relative: 1 %
Eosinophils Absolute: 0.3 10*3/uL (ref 0.0–0.5)
Eosinophils Relative: 2 %
HCT: 38.1 % — ABNORMAL LOW (ref 39.0–52.0)
Hemoglobin: 12.2 g/dL — ABNORMAL LOW (ref 13.0–17.0)
Immature Granulocytes: 0 %
Lymphocytes Relative: 50 %
Lymphs Abs: 7.3 10*3/uL — ABNORMAL HIGH (ref 0.7–4.0)
MCH: 28.2 pg (ref 26.0–34.0)
MCHC: 32 g/dL (ref 30.0–36.0)
MCV: 88.2 fL (ref 80.0–100.0)
Monocytes Absolute: 1.4 10*3/uL — ABNORMAL HIGH (ref 0.1–1.0)
Monocytes Relative: 10 %
Neutro Abs: 5.4 10*3/uL (ref 1.7–7.7)
Neutrophils Relative %: 37 %
Platelets: 245 10*3/uL (ref 150–400)
RBC: 4.32 MIL/uL (ref 4.22–5.81)
RDW: 15.8 % — ABNORMAL HIGH (ref 11.5–15.5)
Smear Review: NORMAL
WBC: 14.7 10*3/uL — ABNORMAL HIGH (ref 4.0–10.5)
nRBC: 0 % (ref 0.0–0.2)

## 2023-09-25 LAB — BASIC METABOLIC PANEL
Anion gap: 14 (ref 5–15)
BUN: 38 mg/dL — ABNORMAL HIGH (ref 8–23)
CO2: 23 mmol/L (ref 22–32)
Calcium: 8.9 mg/dL (ref 8.9–10.3)
Chloride: 104 mmol/L (ref 98–111)
Creatinine, Ser: 1.92 mg/dL — ABNORMAL HIGH (ref 0.61–1.24)
GFR, Estimated: 33 mL/min — ABNORMAL LOW (ref >60)
Glucose, Bld: 116 mg/dL — ABNORMAL HIGH (ref 70–99)
Potassium: 2.9 mmol/L — ABNORMAL LOW (ref 3.5–5.1)
Sodium: 141 mmol/L (ref 135–145)

## 2023-09-25 MED ORDER — AMLODIPINE BESYLATE 10 MG PO TABS
10.0000 mg | ORAL_TABLET | Freq: Every day | ORAL | Status: DC
  Administered 2023-09-25 – 2023-09-26 (×2): 10 mg via ORAL
  Filled 2023-09-25 (×2): qty 1

## 2023-09-25 MED ORDER — POTASSIUM CHLORIDE CRYS ER 20 MEQ PO TBCR
40.0000 meq | EXTENDED_RELEASE_TABLET | Freq: Two times a day (BID) | ORAL | Status: AC
  Administered 2023-09-25 (×2): 40 meq via ORAL
  Filled 2023-09-25 (×2): qty 2

## 2023-09-25 MED ORDER — LISINOPRIL 20 MG PO TABS: 40.0000 mg | ORAL_TABLET | Freq: Every day | ORAL | Status: DC

## 2023-09-25 MED ORDER — NYSTATIN 100000 UNIT/ML MT SUSP
5.0000 mL | Freq: Four times a day (QID) | OROMUCOSAL | Status: DC
Start: 2023-09-25 — End: 2023-09-26
  Administered 2023-09-25 – 2023-09-26 (×5): 500000 [IU] via OROMUCOSAL
  Filled 2023-09-25 (×5): qty 5

## 2023-09-25 NOTE — TOC Progression Note (Signed)
Transition of Care [facility]) - Progression Note    Patient Details  Name: Donald [name] MRN: [id number] Date of Birth: [birth year]  [facility]) CM/SW Contact  Truddie Hidden, RN Phone Number: 09/25/2023, 10:27 AM  Clinical Narrative:    TOC continuing to follow patient's progress throughout discharge planning.        Expected Discharge Plan and Services                                               Social Determinants of Health (SDOH) Interventions SDOH Screenings   Food Insecurity: No Food Insecurity (10/21/2018)  Housing: Low Risk  (09/24/2023)  Transportation Needs: No Transportation Needs (09/24/2023)  Alcohol Screen: Low Risk  (09/24/2023)  Depression (PHQ2-9): Low Risk  (05/31/2021)  Financial Resource Strain: Low Risk  (09/24/2023)  Physical Activity: Insufficiently Active (05/24/2020)  Social Connections: Unknown (05/24/2020)  Stress: No Stress Concern Present (05/24/2020)  Tobacco Use: Medium Risk (09/22/2023)    Readmission Risk Interventions     No data to display

## 2023-09-25 NOTE — Progress Notes (Signed)
[facility] CLINIC CARDIOLOGY PROGRESS NOTE       Patient ID: Donald [name] MRN: [id number] DOB/AGE: [birth year] 89 y.o.  Admit date: 09/22/2023 Referring Physician Dr. Lurene Shadow Primary Physician Ricky Ala, NP  Primary Cardiologist Dr. Gwen Pounds (last seen 2022) Reason for Consultation acute heart failure, elevated troponins  HPI: Donald [name] is a 89 y.o. male  with a past medical history of coronary artery disease by CT, atrial fibrillation/flutter, COPD, CLL, peripheral vascular disease, hypertension, hyperlipidemia who presented to the ED on 09/22/2023 for shortness of breath and chest pain. Cardiology was consulted for further evaluation.   Interval history: -Patient states he feels well this AM. Denies any CP or SOB.  -Bump in Cr this AM, urine dark. -BP remains elevated. HR stable.   Review of systems complete and found to be negative unless listed above    Past Medical History:  Diagnosis Date   Anemia    Arthritis    BPH (benign prostatic hyperplasia)    Cancer (HCC)    Lung   COPD (chronic obstructive pulmonary disease) (HCC)    Emphysema of lung (HCC)    Emphysema of lung (HCC)    Esophageal reflux    Hyperlipidemia    Hypertension    Internal carotid artery stenosis, right    Neuropathy of both feet    Pneumonia    recurrent   PTSD (post-traumatic stress disorder)    [place] vet   Sebaceous cyst    SOB (shortness of breath) on exertion    TIA (transient ischemic attack)    Urge incontinence     Past Surgical History:  Procedure Laterality Date   CYST EXCISION     buttocks   ELECTROMAGNETIC NAVIGATION BROCHOSCOPY Left 08/23/2019   Procedure: ELECTROMAGNETIC NAVIGATION BRONCHOSCOPY;  Surgeon: Salena Saner, MD;  Location: [facility] ORS;  Service: Cardiopulmonary;  Laterality: Left;   ENDOBRONCHIAL ULTRASOUND N/A 08/23/2019   Procedure: ENDOBRONCHIAL ULTRASOUND;  Surgeon: Salena Saner, MD;  Location: [facility] ORS;  Service:  Cardiopulmonary;  Laterality: N/A;   HEMORRHOID SURGERY     shave biopsy  03/13/2020   left neck 03/13/20 [facility] Mikki Santee wart with atypica inflamed +AK no carcinoma    SINUSOTOMY     VIDEO BRONCHOSCOPY WITH ENDOBRONCHIAL NAVIGATION Left 06/26/2020   Procedure: VIDEO BRONCHOSCOPY WITH ENDOBRONCHIAL NAVIGATION;  Surgeon: Salena Saner, MD;  Location: [facility] ORS;  Service: Pulmonary;  Laterality: Left;    Medications Prior to Admission  Medication Sig Dispense Refill Last Dose   acetaminophen (TYLENOL) 325 MG tablet Take 2 tablets (650 mg total) by mouth every 6 (six) hours.   prn at unk   albuterol (VENTOLIN HFA) 108 (90 Base) MCG/ACT inhaler Inhale 2 puffs into the lungs every 6 (six) hours as needed for wheezing or shortness of breath. 18 g 6 prn at unk   apixaban (ELIQUIS) 2.5 MG TABS tablet Take 1 tablet (2.5 mg total) by mouth 2 (two) times daily.   09/22/2023   atorvastatin (LIPITOR) 80 MG tablet Take 40 mg by mouth at bedtime.   Past Week   DULoxetine (CYMBALTA) 30 MG capsule Take 30 mg by mouth 2 (two) times daily.   09/22/2023   famotidine (PEPCID) 20 MG tablet Take 20 mg by mouth daily as needed for indigestion or heartburn.   prn at unk   finasteride (PROSCAR) 5 MG tablet Take 5 mg by mouth daily.   09/22/2023   guaifenesin (HUMIBID E) 400 MG TABS tablet Take 400  mg by mouth 2 (two) times daily as needed (cough).   prn at unk   ipratropium (ATROVENT) 0.03 % nasal spray Place 2 sprays into both nostrils daily.   09/22/2023   lisinopril (ZESTRIL) 10 MG tablet Take 5 mg by mouth daily.   09/22/2023   metoprolol tartrate (LOPRESSOR) 25 MG tablet Take 25 mg by mouth 2 (two) times daily.   09/22/2023   senna-docusate (SENOKOT-S) 8.6-50 MG tablet Take 1 tablet by mouth 2 (two) times daily between meals as needed for mild constipation. 60 tablet 0 prn at unk   tamsulosin (FLOMAX) 0.4 MG CAPS capsule Take 0.4 mg by mouth every evening.   Past Week   Tiotropium Bromide-Olodaterol  2.5-2.5 MCG/ACT AERS Inhale 2 puffs into the lungs daily.   09/22/2023   valACYclovir (VALTREX) 500 MG tablet Take 500 mg by mouth every other day.   Past Week   amLODipine (NORVASC) 5 MG tablet Take 1 tablet (5 mg total) by mouth daily. (Patient not taking: Reported on 09/23/2023)   Not Taking   carvedilol (COREG) 12.5 MG tablet Take 1 tablet (12.5 mg total) by mouth 2 (two) times daily with a meal. (Patient not taking: Reported on 09/23/2023)   Not Taking   cholecalciferol (VITAMIN D) 1000 UNITS tablet Take 1,000 Units by mouth daily. (Patient not taking: Reported on 09/23/2023)   Not Taking   hydrALAZINE (APRESOLINE) 25 MG tablet Take 1 tablet (25 mg total) by mouth every 6 (six) hours as needed (SBP>160 or DBP>110). (Patient not taking: Reported on 09/23/2023)   Not Taking   LYCOPENE PO Take 2 tablets by mouth daily. (Patient not taking: Reported on 09/23/2023)   Not Taking   Multiple Vitamins-Minerals (MEGA MULTIVITAMIN FOR MEN) TABS Take 1 tablet by mouth daily.   unk   polyethylene glycol (MIRALAX / GLYCOLAX) 17 g packet Take 17 g by mouth 2 (two) times daily as needed for mild constipation. (Patient not taking: Reported on 09/23/2023) 14 each 0 Not Taking   sodium phosphate (FLEET) 7-19 GM/118ML ENEM Place 133 mLs (1 enema total) rectally daily as needed for severe constipation. (Patient not taking: Reported on 09/23/2023)  0 Not Taking   Social History   Socioeconomic History   Marital status: Married    Spouse name: Cassandria Anger   Number of children: 2   Years of education: Not on file   Highest education level: Some college, no degree  Occupational History   Not on file  Tobacco Use   Smoking status: Former    Current packs/day: 0.00    Average packs/day: 1.5 packs/day for 50.0 years (75.0 ttl pk-yrs)    Types: Cigarettes    Start date: 09/09/1949    Quit date: 09/10/1999    Years since quitting: 24.0   Smokeless tobacco: Never  Vaping Use   Vaping status: Never Used   Substance and Sexual Activity   Alcohol use: No    Alcohol/week: 0.0 standard drinks of alcohol    Comment: not since 12/1980   Drug use: No   Sexual activity: Yes  Other Topics Concern   Not on file  Social History Narrative   Lives in [place] with wife. Dog and cat in home.      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  Recruitment consultant.      Diet - regular      Exercise - Walking   Social Determinants of Health   Financial Resource Strain: Low Risk  (09/24/2023)   Overall  Financial Resource Strain (CARDIA)    Difficulty of Paying Living Expenses: Not hard at all  Food Insecurity: No Food Insecurity (10/21/2018)   Hunger Vital Sign    Worried About Running Out of Food in the Last Year: Never true    Ran Out of Food in the Last Year: Never true  Transportation Needs: No Transportation Needs (09/24/2023)   PRAPARE - Administrator, Civil Service (Medical): No    Lack of Transportation (Non-Medical): No  Physical Activity: Insufficiently Active (05/24/2020)   Exercise Vital Sign    Days of Exercise per Week: 5 days    Minutes of Exercise per Session: 20 min  Stress: No Stress Concern Present (05/24/2020)   [employer] of Occupational Health - Occupational Stress Questionnaire    Feeling of Stress : Not at all  Social Connections: Unknown (05/24/2020)   Social Connection and Isolation Panel [NHANES]    Frequency of Communication with Friends and Family: Not on file    Frequency of Social Gatherings with Friends and Family: Not on file    Attends Religious Services: Not on file    Active Member of Clubs or Organizations: Not on file    Attends Banker Meetings: Not on file    Marital Status: Married  Intimate Partner Violence: Not At Risk (05/24/2020)   Humiliation, Afraid, Rape, and Kick questionnaire    Fear of Current or Ex-Partner: No    Emotionally Abused: No    Physically Abused: No    Sexually Abused: No    Family History  Problem  Relation Age of Onset   Heart disease Mother    Cancer Maternal Aunt        lung   Lung cancer Maternal Aunt    Leukemia Maternal Aunt    Hypertension Son      Vitals:   09/25/23 0015 09/25/23 0339 09/25/23 0814 09/25/23 1243  BP: 126/62 (!) 161/61 (!) 162/76 (!) 115/47  Pulse: 98 71 76 73  Resp: 18 16 16 16   Temp: 97.9 F (36.6 C) 97.8 F (36.6 C) (!) 97.5 F (36.4 C) (!) 97.5 F (36.4 C)  TempSrc: Oral Oral Oral   SpO2: 98%  95% 98%  Weight:      Height:        PHYSICAL EXAM General: Chronically ill-appearing, well nourished, in no acute distress sitting upright in hospital bed eating breakfast. HEENT: Normocephalic and atraumatic. Neck: No JVD.  Lungs: Normal respiratory effort on RA. Clear bilaterally to auscultation. + Rhonchi.  No wheezing. Heart: Irregularly irregular, controlled rate. Normal S1 and S2 without gallops or murmurs.  Abdomen: Non-distended appearing.  Msk: Normal strength and tone for age. Extremities: Warm and well perfused. No clubbing, cyanosis.  Trace edema.  Neuro: Alert and oriented X 3. Psych: Answers questions appropriately.   Labs: Basic Metabolic Panel: Recent Labs    09/24/23 0410 09/25/23 0423  NA 140 141  K 3.5 2.9*  CL 102 104  CO2 24 23  GLUCOSE 120* 116*  BUN 24* 38*  CREATININE 1.41* 1.92*  CALCIUM 8.9 8.9   Liver Function Tests: No results for input(s): "AST", "ALT", "ALKPHOS", "BILITOT", "PROT", "ALBUMIN" in the last 72 hours. No results for input(s): "LIPASE", "AMYLASE" in the last 72 hours. CBC: Recent Labs    09/24/23 0410 09/25/23 0423  WBC 13.1* 14.7*  NEUTROABS 5.8 5.4  HGB 12.4* 12.2*  HCT 38.6* 38.1*  MCV 88.5 88.2  PLT 223 245   Cardiac Enzymes:  Recent Labs    09/22/23 1828 09/22/23 2215  TROPONINIHS 74* 81*   BNP: Recent Labs    09/22/23 2215  BNP 419.0*   D-Dimer: No results for input(s): "DDIMER" in the last 72 hours. Hemoglobin A1C: No results for input(s): "HGBA1C" in the last 72  hours. Fasting Lipid Panel: No results for input(s): "CHOL", "HDL", "LDLCALC", "TRIG", "CHOLHDL", "LDLDIRECT" in the last 72 hours. Thyroid Function Tests: No results for input(s): "TSH", "T4TOTAL", "T3FREE", "THYROIDAB" in the last 72 hours.  Invalid input(s): "FREET3" Anemia Panel: No results for input(s): "VITAMINB12", "FOLATE", "FERRITIN", "TIBC", "IRON", "RETICCTPCT" in the last 72 hours.   Radiology: ECHOCARDIOGRAM COMPLETE  Result Date: 09/24/2023    ECHOCARDIOGRAM REPORT   Patient Name:   Donald [name] Date of Exam: 09/24/2023 Medical Rec #:  [id number]            Height:       68.0 in Accession #:    [id number]           Weight:       214.0 lb Date of Birth:  03/16/1933            BSA:          2.103 m Patient Age:    89 years             BP:           114/46 mmHg Patient Gender: M                    HR:           77 bpm. Exam Location:  [facility] Procedure: 2D Echo, Cardiac Doppler and Color Doppler Indications:     CHF  History:         Patient has no prior history of Echocardiogram examinations.                  CHF, CAD, Stroke and COPD, Arrythmias:Atrial Fibrillation and                  Bradycardia, Signs/Symptoms:Dizziness/Lightheadedness and                  Shortness of Breath; Risk Factors:Hypertension, Diabetes and                  Former Smoker. Lung CA, CKD, Dementia.  Sonographer:     Mikki Harbor Referring Phys:  [id number] Donald [name] Diagnosing Phys: Alwyn Pea MD  Sonographer Comments: Technically difficult study due to poor echo windows and suboptimal parasternal window. Image acquisition challenging due to respiratory motion. IMPRESSIONS  1. Left ventricular ejection fraction, by estimation, is 65 to 70%. The left ventricle has normal function. The left ventricle has no regional wall motion abnormalities. Left ventricular diastolic parameters are consistent with Grade II diastolic dysfunction (pseudonormalization).  2. Right ventricular systolic function is  normal. The right ventricular size is normal. There is normal pulmonary artery systolic pressure.  3. The mitral valve is normal in structure. Trivial mitral valve regurgitation.  4. The aortic valve is normal in structure. Aortic valve regurgitation is not visualized. FINDINGS  Left Ventricle: Left ventricular ejection fraction, by estimation, is 65 to 70%. The left ventricle has normal function. The left ventricle has no regional wall motion abnormalities. The left ventricular internal cavity size was normal in size. There is  no left ventricular hypertrophy. Left ventricular diastolic parameters are consistent with Grade II diastolic dysfunction (pseudonormalization). Right Ventricle: The  right ventricular size is normal. No increase in right ventricular wall thickness. Right ventricular systolic function is normal. There is normal pulmonary artery systolic pressure. The tricuspid regurgitant velocity is 2.27 m/s, and  with an assumed right atrial pressure of 8 mmHg, the estimated right ventricular systolic pressure is 28.6 mmHg. Left Atrium: Left atrial size was normal in size. Right Atrium: Right atrial size was normal in size. Pericardium: There is no evidence of pericardial effusion. Mitral Valve: The mitral valve is normal in structure. Trivial mitral valve regurgitation. MV peak gradient, 3.7 mmHg. The mean mitral valve gradient is 1.0 mmHg. Tricuspid Valve: The tricuspid valve is normal in structure. Tricuspid valve regurgitation is trivial. Aortic Valve: The aortic valve is normal in structure. Aortic valve regurgitation is not visualized. Aortic valve mean gradient measures 5.0 mmHg. Aortic valve peak gradient measures 10.3 mmHg. Aortic valve area, by VTI measures 2.12 cm. Pulmonic Valve: The pulmonic valve was normal in structure. Pulmonic valve regurgitation is not visualized. Aorta: The ascending aorta was not well visualized. IAS/Shunts: No atrial level shunt detected by color flow Doppler.  LEFT  VENTRICLE PLAX 2D LVIDd:         4.40 cm LVIDs:         2.70 cm LV PW:         1.30 cm LV IVS:        1.00 cm LVOT diam:     1.90 cm LV SV:         57 LV SV Index:   27 LVOT Area:     2.84 cm  RIGHT VENTRICLE RV Basal diam:  4.20 cm RV Mid diam:    2.80 cm RV S prime:     10.20 cm/s LEFT ATRIUM             Index        RIGHT ATRIUM           Index LA diam:        4.90 cm 2.33 cm/m   RA Area:     23.10 cm LA Vol (A2C):   94.5 ml 44.93 ml/m  RA Volume:   74.20 ml  35.28 ml/m LA Vol (A4C):   78.1 ml 37.13 ml/m LA Biplane Vol: 88.0 ml 41.84 ml/m  AORTIC VALVE                     PULMONIC VALVE AV Area (Vmax):    2.03 cm      PV Vmax:       0.96 m/s AV Area (Vmean):   2.16 cm      PV Peak grad:  3.7 mmHg AV Area (VTI):     2.12 cm AV Vmax:           160.50 cm/s AV Vmean:          105.500 cm/s AV VTI:            0.270 m AV Peak Grad:      10.3 mmHg AV Mean Grad:      5.0 mmHg LVOT Vmax:         115.00 cm/s LVOT Vmean:        80.250 cm/s LVOT VTI:          0.202 m LVOT/AV VTI ratio: 0.75  AORTA Ao Root diam: 3.50 cm MITRAL VALVE               TRICUSPID VALVE MV Area (PHT): 4.99 cm  TR Peak grad:   20.6 mmHg MV Area VTI:   2.76 cm    TR Vmax:        227.00 cm/s MV Peak grad:  3.7 mmHg MV Mean grad:  1.0 mmHg    SHUNTS MV Vmax:       0.97 m/s    Systemic VTI:  0.20 m MV Vmean:      53.6 cm/s   Systemic Diam: 1.90 cm MV Decel Time: 152 msec MV E velocity: 66.60 cm/s MV A velocity: 37.70 cm/s MV E/A ratio:  1.77 Donald Salome Arnt MD Electronically signed by Alwyn Pea MD Signature Date/Time: 09/24/2023/7:40:38 PM    Final    DG Chest 2 View  Result Date: 09/22/2023 CLINICAL DATA:  Chest pain short of breath EXAM: CHEST - 2 VIEW COMPARISON:  06/13/2022, CT 04/15/2022 FINDINGS: Small bilateral effusions. Cardiomegaly with mild diffuse interstitial opacity. Irregular left apical opacity corresponding to prior nodule and possible post treatment changes. This appears more confluent in the interim.  IMPRESSION: 1. Cardiomegaly with mild diffuse interstitial opacity and small bilateral effusions, suspect for CHF. 2. Irregular left apical opacity corresponding to prior nodule and post treatment changes but radiographically appears more dense and confluent,, suggest correlation with chest CT. Electronically Signed   By: Jasmine Pang M.D.   On: 09/22/2023 22:35    ECHO as above  TELEMETRY reviewed by me 09/25/2023: Atrial fibrillation rate 80s  EKG reviewed by me: Atrial flutter with variable block rate 79 bpm  Data reviewed by me 09/25/2023: last 24h vitals tele labs imaging I/O hospitalist progress note  Principal Problem:   Hypertensive emergency Active Problems:   COPD   CLL   Coronary artery disease involving native coronary artery of native heart without angina pectoris   Atrial fibrillation, chronic (HCC)   Acute CHF (congestive heart failure) (HCC)   Long term current use of anticoagulant   Elevated troponin    ASSESSMENT AND PLAN:  Donald [name] is a 89 y.o. male  with a past medical history of coronary artery disease by CT, atrial fibrillation/flutter, COPD, CLL, peripheral vascular disease, hypertension, hyperlipidemia who presented to the ED on 09/22/2023 for shortness of breath and chest pain. Cardiology was consulted for further evaluation.   # Acute on chronic HFpEF (EF 50-55% in 03/2021) # Hypertensive emergency # Demand ischemia Patient presented with shortness of breath worsening since Saturday.  BNP 419.  Troponins mildly elevated and flat trending 74 > 81.  EKG with rate controlled atrial flutter.  BP significantly elevated while in the ED. Echo this admission with EF 65-70%, grade II diastolic dysfunction.  -Discontinue lasix.  -Hold lisinopril today. Increase amlodipine to 10 mg daily. -Troponin elevation most consistent with demand/supply mismatch and not ACS in the setting of acute heart failure and hypertensive emergency.  # AKI Patient with Cr up to  1.92 this AM.  -Continue to monitor renal function closely.  -Avoid nephrotoxic medications.   # Atrial flutter/fibrillation Patient with known history of atrial fibrillation/flutter.  Noted to be in a flutter with controlled rates since admitted. -Continue metoprolol as above for rate control. -Continue Eliquis 2.5 mg twice daily for stroke risk reduction.  Based on criteria patient does not qualify for reduced dose but it appears he has been taking this dose since 2022 or before.   This patient's plan of care was discussed and created with Dr. Corky Sing and he is in agreement.  SignedGale Journey, PA-C  09/25/2023, 1:55 PM  [facility] Cardiology

## 2023-09-25 NOTE — Progress Notes (Signed)
[facility]  - [place] [facility]   PATIENT NAME: Donald [name]    MR#:  [id number]  DATE OF BIRTH:  11-02-1933  SUBJECTIVE:  no family in the room during my evaluation earlier. Spoke with wife on the phone. Came in with shortness of breath feeling better. Received IV Lasix however now discontinued due to elevated creatinine. Denies any chest pain    VITALS:  Blood pressure (!) 115/47, pulse 73, temperature (!) 97.5 F (36.4 C), resp. rate 16, height 5\' 8"  (1.727 m), weight 97.1 kg, SpO2 98%.  PHYSICAL EXAMINATION:   GENERAL:  89 y.o.-year-old patient with no acute distress.  LUNGS: decreased breath sounds bilaterally, no wheezing CARDIOVASCULAR: S1, S2 normal. No murmur   ABDOMEN: Soft, nontender, nondistended. Bowel sounds present.  EXTREMITIES: No  edema b/l.    NEUROLOGIC: nonfocal  patient is alert and awake SKIN: No obvious rash, lesion, or ulcer.   LABORATORY PANEL:  CBC Recent Labs  Lab 09/25/23 0423  WBC 14.7*  HGB 12.2*  HCT 38.1*  PLT 245    Chemistries  Recent Labs  Lab 09/25/23 0423  NA 141  K 2.9*  CL 104  CO2 23  GLUCOSE 116*  BUN 38*  CREATININE 1.92*  CALCIUM 8.9   Cardiac Enzymes No results for input(s): "TROPONINI" in the last 168 hours. RADIOLOGY:  MR BRAIN WO CONTRAST  Result Date: 09/25/2023 CLINICAL DATA:  Neuro deficit, stroke suspected EXAM: MRI HEAD WITHOUT CONTRAST TECHNIQUE: Multiplanar, multiecho pulse sequences of the brain and surrounding structures were obtained without intravenous contrast. COMPARISON:  12/31/2018 MRI head, correlation is also made with 04/15/2022 CT head FINDINGS: Brain: No restricted diffusion to suggest acute or subacute infarct. No acute hemorrhage, mass, mass effect, or midline shift. No hydrocephalus or extra-axial collection. Pituitary and craniocervical junction within normal limits. No hemosiderin deposition to suggest remote hemorrhage. Remote lacunar infarcts in the left  cerebellum and right thalamus are new from the prior MRI. Additional remote lacunar infarcts in the right basal ganglia and corona radiata are unchanged. T2 hyperintense signal in the periventricular white matter, likely the sequela of chronic small vessel ischemic disease. Vascular: Normal arterial flow voids. Skull and upper cervical spine: Normal marrow signal. Sinuses/Orbits: Mucosal thickening in the left maxillary sinus. Status post bilateral lens replacements. Other: Trace fluid in the mastoid air cells. IMPRESSION: No acute intracranial process. No evidence of acute or subacute infarct. Electronically Signed   By: Wiliam Ke M.D.   On: 09/25/2023 14:52   ECHOCARDIOGRAM COMPLETE  Result Date: 09/24/2023    ECHOCARDIOGRAM REPORT   Patient Name:   Donald [name] Date of Exam: 09/24/2023 Medical Rec #:  [id number]            Height:       68.0 in Accession #:    [id number]           Weight:       214.0 lb Date of Birth:  17-Dec-1932            BSA:          2.103 m Patient Age:    89 years             BP:           114/46 mmHg Patient Gender: M                    HR:           77 bpm.  Exam Location:  [facility] Procedure: 2D Echo, Cardiac Doppler and Color Doppler Indications:     CHF  History:         Patient has no prior history of Echocardiogram examinations.                  CHF, CAD, Stroke and COPD, Arrythmias:Atrial Fibrillation and                  Bradycardia, Signs/Symptoms:Dizziness/Lightheadedness and                  Shortness of Breath; Risk Factors:Hypertension, Diabetes and                  Former Smoker. Lung CA, CKD, Dementia.  Sonographer:     Mikki Harbor Referring Phys:  [id number] CARALYN HUDSON Diagnosing Phys: Alwyn Pea MD  Sonographer Comments: Technically difficult study due to poor echo windows and suboptimal parasternal window. Image acquisition challenging due to respiratory motion. IMPRESSIONS  1. Left ventricular ejection fraction, by estimation, is 65 to 70%. The  left ventricle has normal function. The left ventricle has no regional wall motion abnormalities. Left ventricular diastolic parameters are consistent with Grade II diastolic dysfunction (pseudonormalization).  2. Right ventricular systolic function is normal. The right ventricular size is normal. There is normal pulmonary artery systolic pressure.  3. The mitral valve is normal in structure. Trivial mitral valve regurgitation.  4. The aortic valve is normal in structure. Aortic valve regurgitation is not visualized. FINDINGS  Left Ventricle: Left ventricular ejection fraction, by estimation, is 65 to 70%. The left ventricle has normal function. The left ventricle has no regional wall motion abnormalities. The left ventricular internal cavity size was normal in size. There is  no left ventricular hypertrophy. Left ventricular diastolic parameters are consistent with Grade II diastolic dysfunction (pseudonormalization). Right Ventricle: The right ventricular size is normal. No increase in right ventricular wall thickness. Right ventricular systolic function is normal. There is normal pulmonary artery systolic pressure. The tricuspid regurgitant velocity is 2.27 m/s, and  with an assumed right atrial pressure of 8 mmHg, the estimated right ventricular systolic pressure is 28.6 mmHg. Left Atrium: Left atrial size was normal in size. Right Atrium: Right atrial size was normal in size. Pericardium: There is no evidence of pericardial effusion. Mitral Valve: The mitral valve is normal in structure. Trivial mitral valve regurgitation. MV peak gradient, 3.7 mmHg. The mean mitral valve gradient is 1.0 mmHg. Tricuspid Valve: The tricuspid valve is normal in structure. Tricuspid valve regurgitation is trivial. Aortic Valve: The aortic valve is normal in structure. Aortic valve regurgitation is not visualized. Aortic valve mean gradient measures 5.0 mmHg. Aortic valve peak gradient measures 10.3 mmHg. Aortic valve area, by VTI  measures 2.12 cm. Pulmonic Valve: The pulmonic valve was normal in structure. Pulmonic valve regurgitation is not visualized. Aorta: The ascending aorta was not well visualized. IAS/Shunts: No atrial level shunt detected by color flow Doppler.  LEFT VENTRICLE PLAX 2D LVIDd:         4.40 cm LVIDs:         2.70 cm LV PW:         1.30 cm LV IVS:        1.00 cm LVOT diam:     1.90 cm LV SV:         57 LV SV Index:   27 LVOT Area:     2.84 cm  RIGHT VENTRICLE RV Basal diam:  4.20 cm RV Mid diam:    2.80 cm RV S prime:     10.20 cm/s LEFT ATRIUM             Index        RIGHT ATRIUM           Index LA diam:        4.90 cm 2.33 cm/m   RA Area:     23.10 cm LA Vol (A2C):   94.5 ml 44.93 ml/m  RA Volume:   74.20 ml  35.28 ml/m LA Vol (A4C):   78.1 ml 37.13 ml/m LA Biplane Vol: 88.0 ml 41.84 ml/m  AORTIC VALVE                     PULMONIC VALVE AV Area (Vmax):    2.03 cm      PV Vmax:       0.96 m/s AV Area (Vmean):   2.16 cm      PV Peak grad:  3.7 mmHg AV Area (VTI):     2.12 cm AV Vmax:           160.50 cm/s AV Vmean:          105.500 cm/s AV VTI:            0.270 m AV Peak Grad:      10.3 mmHg AV Mean Grad:      5.0 mmHg LVOT Vmax:         115.00 cm/s LVOT Vmean:        80.250 cm/s LVOT VTI:          0.202 m LVOT/AV VTI ratio: 0.75  AORTA Ao Root diam: 3.50 cm MITRAL VALVE               TRICUSPID VALVE MV Area (PHT): 4.99 cm    TR Peak grad:   20.6 mmHg MV Area VTI:   2.76 cm    TR Vmax:        227.00 cm/s MV Peak grad:  3.7 mmHg MV Mean grad:  1.0 mmHg    SHUNTS MV Vmax:       0.97 m/s    Systemic VTI:  0.20 m MV Vmean:      53.6 cm/s   Systemic Diam: 1.90 cm MV Decel Time: 152 msec MV E velocity: 66.60 cm/s MV A velocity: 37.70 cm/s MV E/A ratio:  1.77 Dwayne D Callwood MD Electronically signed by Alwyn Pea MD Signature Date/Time: 09/24/2023/7:40:38 PM    Final     Assessment and Plan Donald [name] is a 89 y.o. male  with medical history significant for COPD, no longer on home O2, HTN, A  flutter on Eliquis, CLL, lung cancer s/p radiation, right ICA stenosis, BPH, paronychia and onychomycosis of left great toe, who presented to the hospital with right-sided chest pain and shortness of breath with exertion for about 3 days duration.  He also reported swelling in his legs.  Reportedly, oxygen saturation dropped into the 80s.   In the ED, he was afebrile, BP was up to 220/81, tachypneic with respiratory rate between 20 and 30, oxygen saturation went down to 85% on room air He was admitted to the hospital for acute CHF exacerbation.  Acute on chronic diastolic CHF exacerbation elevated troponin suspect demand ischemia in the setting of CHF  received IV Lasix per cardiology team.  Monitor BMP, daily weight and urine output.  BNP 419. 2D echo  EF 65 to 70% with great toe diastolic dysfunction -- discontinued Lasix due to elevated creatinine -- hold lisinopril -- cardiology increased amlodipine 10 mg   Right-sided chest pain, mildly elevated troponins, underlying CAD: Chest pain has resolved.  Elevated troponins likely due to demand ischemia. --Patient was evaluated by the cardiologist.  No plan for cardiac catheterization.    Hypertensive emergency: IV nitroglycerin drip has been discontinued.   --Continue oral antihypertensives metoprolol and amlodipine.  -- Lisinopril discontinue due to elevated creatinine   acute on CKD stage IIIa. Likely due to over diuresis    Acute hypoxemic respiratory failure: Improved.    atrial fibrillation and atrial flutter.  --Continue Eliquis and metoprolol    Acute metabolic encephalopathy, underlying memory/cognitive impairment: Continue supportive care -- MRI brain negative for stroke    COPD: Compensated.  Continue bronchodilators    Chronic lymphocytic leukemia: His daughter confirmed that he is going to start the new medicine for this but his oncologist wanted to wait until acute issues are over. Follow-up with oncologist at the [facility]  cl[facility].  PT OT to see patient   Procedures: Family communication : wife on the phone Consults : cardiology CODE STATUS: full DVT Prophylaxis : eliquis Level of care: Progressive Status is: Inpatient Remains inpatient appropriate because: CHF    TOTAL TIME TAKING CARE OF THIS PATIENT: 35 minutes.  >50% time spent on counselling and coordination of care  Note: This dictation was prepared with Dragon dictation along with smaller phrase technology. Any transcriptional errors that result from this process are unintentional.  Enedina Finner M.D    [facility]   CC: Primary care physician; Ricky Ala, NP

## 2023-09-25 NOTE — Evaluation (Signed)
Physical Therapy Evaluation Patient Details Name: Donald [name] MRN: [id number] DOB: Jul 26, 1933 Today's Date: 09/25/2023  History of Present Illness  Pt is an 89 y.o. male presenting to hospital 09/22/23 with c/o SOB, cough, and intermittent chest tightness.  Pt admitted with acute CHF, hypertensive emergency, elevated troponin (suspect demand ischemia per MD note), chronic a-fib.  PMH includes CLL (in remission), htn, HLD, COPD, a-flutter on Eliquis, lung CA s/p radiation, R ICA stenosis, BPH, neuropathy B feet. PTSD, urge incontinence, paronychia and onychomycosis of L great toe.  Clinical Impression  Prior to recent medical concerns, pt was ambulatory (no AD use in home unless feeling weak--held onto furniture as needed; use of 4ww in community); lives with his wife (and dog) in 1 level home with ramp to enter.  Pt sitting in recliner upon PT arrival; pt's daughter present.  Pt was oriented to person, hospital, general situation, and month/year but pt did demonstrate some generalized confusion and difficulty following cues during session.  When pt stood up from recliner with assist, pt was leaning heavily to the L side and then fell backwards (therapist supported pt and assisted pt back to sitting safely in recliner). Pt reported dizziness when he first stood up but resolved after that (pt and pt's daughter report pt with h/o vertigo). 2nd and 3rd time pt stood up pt had only a mild L lean and was able to walk 80 feet with RW (min assist x1 plus CGA of 2nd)--pt still noted with mild L lean (and decreased L heel-strike). UE/LE strength testing performed: L DF weakness (3+/5) noted.  Pt's daughter reports L lean and weakness is new; pt's nurse and MD Allena Katz notified of these concerns and updated on session.  Pt would currently benefit from skilled PT to address noted impairments and functional limitations (see below for any additional details).  Upon hospital discharge, pt would benefit from ongoing  therapy.     If plan is discharge home, recommend the following: A lot of help with walking and/or transfers;A lot of help with bathing/dressing/bathroom;Assistance with cooking/housework;Direct supervision/assist for medications management;Direct supervision/assist for financial management;Assist for transportation;Help with stairs or ramp for entrance;Supervision due to cognitive status   Can travel by private vehicle   No    Equipment Recommendations Rolling walker (2 wheels);BSC/3in1  Recommendations for Other Services       Functional Status Assessment Patient has had a recent decline in their functional status and demonstrates the ability to make significant improvements in function in a reasonable and predictable amount of time.     Precautions / Restrictions Precautions Precautions: Fall Restrictions Weight Bearing Restrictions: No      Mobility  Bed Mobility               General bed mobility comments: Deferred (pt in recliner beginning/end of session)    Transfers Overall transfer level: Needs assistance Equipment used: Rolling walker (2 wheels) Transfers: Sit to/from Stand Sit to Stand: Max assist, Min assist           General transfer comment: max assist 1st trial standing d/t heavy L lean and then posterior loss of balance requiring assist to safely sit back down in recliner; min assist next 2 trials standing up from recliner    Ambulation/Gait Ambulation/Gait assistance: Min assist, Contact guard assist, +2 physical assistance Gait Distance (Feet): 80 Feet Assistive device: Rolling walker (2 wheels) Gait Pattern/deviations: Step-through pattern, Decreased step length - right, Decreased step length - left Gait velocity: decreased  General Gait Details: Mild L lean; decreased L heelstrike; assist to steady; vc's for upright posture  Stairs            Wheelchair Mobility     Tilt Bed    Modified Rankin (Stroke Patients Only)        Balance Overall balance assessment: Needs assistance Sitting-balance support: No upper extremity supported, Feet supported Sitting balance-Leahy Scale: Fair Sitting balance - Comments: steady static sitting but L lean noted intermittently   Standing balance support: Bilateral upper extremity supported, Reliant on assistive device for balance Standing balance-Leahy Scale: Poor Standing balance comment: L lean noted in standing (improved with cueing and practice) with B UE support on RW; assist and cueing required to correct to midline posture                             Pertinent Vitals/Pain Pain Assessment Pain Assessment: No/denies pain Vitals (HR and SpO2 on room air) stable and WFL throughout treatment session; BP 110/53 end of session at rest.    Home Living Family/patient expects to be discharged to:: Private residence Living Arrangements: Spouse/significant other Available Help at Discharge: Family;Available PRN/intermittently (Wife unable to physically assist pt) Type of Home: House Home Access: Ramped entrance       Home Layout: One level Home Equipment: Shower seat - built in;Grab bars - tub/shower;Grab bars - toilet;Toilet riser;Rollator (4 wheels)      Prior Function Prior Level of Function : Independent/Modified Independent             Mobility Comments: No AD use in home (unless feeling week)--holds onto furniture as needed.  Uses 4ww in community (can sit down on seat of rollator as needed).  1 recent fall (tripped on someone's shoe). ADLs Comments: (-) driving     Extremity/Trunk Assessment   Upper Extremity Assessment Upper Extremity Assessment:  (B shoulder flexion 4/5; elbow flexion/extension 4+/5; R>L grip strength (pt R handed) but appearing WFL; no pronator drift noted)    Lower Extremity Assessment Lower Extremity Assessment: RLE deficits/detail;LLE deficits/detail RLE Deficits / Details: hip flexion, knee flexion/extension, and DF  4+/5 LLE Deficits / Details: hip flexion and knee flexion/extension 4+/5; DF 3+/5    Cervical / Trunk Assessment Cervical / Trunk Assessment: Other exceptions Cervical / Trunk Exceptions: forward head/shoulders  Communication   Communication Communication: Difficulty following commands/understanding Following commands: Follows one step commands inconsistently;Follows one step commands with increased time Cueing Techniques: Verbal cues;Visual cues;Tactile cues;Gestural cues  Cognition Arousal: Alert Behavior During Therapy: WFL for tasks assessed/performed Overall Cognitive Status: Impaired/Different from baseline (per pt's daughter)                                 General Comments: Oriented to person, hospital, month/year, and general situation.  Inconsistent with following 1 step cues; generalized confusion noted.        General Comments  Nursing cleared pt for participation in physical therapy.  Pt agreeable to PT session.    Exercises     Assessment/Plan    PT Assessment Patient needs continued PT services  PT Problem List Decreased strength;Decreased activity tolerance;Decreased balance;Decreased mobility;Decreased cognition;Decreased knowledge of use of DME;Decreased safety awareness;Decreased knowledge of precautions       PT Treatment Interventions DME instruction;Gait training;Functional mobility training;Therapeutic activities;Therapeutic exercise;Balance training;Neuromuscular re-education;Patient/family education;Cognitive remediation    PT Goals (Current goals can be found in  the Care Plan section)  Acute Rehab PT Goals Patient Stated Goal: to improve strength and balance PT Goal Formulation: With patient/family Time For Goal Achievement: 10/09/23 Potential to Achieve Goals: Good    Frequency Min 1X/week     Co-evaluation               AM-PAC PT "6 Clicks" Mobility  Outcome Measure Help needed turning from your back to your side while  in a flat bed without using bedrails?: A Little Help needed moving from lying on your back to sitting on the side of a flat bed without using bedrails?: A Little Help needed moving to and from a bed to a chair (including a wheelchair)?: A Lot Help needed standing up from a chair using your arms (e.g., wheelchair or bedside chair)?: A Lot Help needed to walk in hospital room?: A Lot Help needed climbing 3-5 steps with a railing? : Total 6 Click Score: 13    End of Session Equipment Utilized During Treatment: Gait belt Activity Tolerance: Patient tolerated treatment well Patient left: in chair;with call bell/phone within reach;with chair alarm set;with family/visitor present Nurse Communication: Mobility status;Precautions;Other (comment) (Pt's symptoms (L lean and L DF weakness)) PT Visit Diagnosis: Unsteadiness on feet (R26.81);Other abnormalities of gait and mobility (R26.89);Muscle weakness (generalized) (M62.81);History of falling (Z91.81)    Time: 1100-1135 PT Time Calculation (min) (ACUTE ONLY): 35 min   Charges:   PT Evaluation $PT Eval Low Complexity: 1 Low PT Treatments $Therapeutic Activity: 8-22 mins PT General Charges $$ ACUTE PT VISIT: 1 Visit        Hendricks Limes, PT 09/25/23, 3:54 PM

## 2023-09-26 DIAGNOSIS — I161 Hypertensive emergency: Secondary | ICD-10-CM | POA: Diagnosis not present

## 2023-09-26 LAB — BASIC METABOLIC PANEL
Anion gap: 13 (ref 5–15)
Anion gap: 13 (ref 5–15)
BUN: 52 mg/dL — ABNORMAL HIGH (ref 8–23)
BUN: 46 mg/dL — ABNORMAL HIGH (ref 8–23)
CO2: 23 mmol/L (ref 22–32)
CO2: 20 mmol/L — ABNORMAL LOW (ref 22–32)
Calcium: 9.1 mg/dL (ref 8.9–10.3)
Calcium: 8.7 mg/dL — ABNORMAL LOW (ref 8.9–10.3)
Chloride: 109 mmol/L (ref 98–111)
Chloride: 107 mmol/L (ref 98–111)
Creatinine, Ser: 2.09 mg/dL — ABNORMAL HIGH (ref 0.61–1.24)
Creatinine, Ser: 1.8 mg/dL — ABNORMAL HIGH (ref 0.61–1.24)
GFR, Estimated: 30 mL/min — ABNORMAL LOW (ref >60)
GFR, Estimated: 36 mL/min — ABNORMAL LOW (ref >60)
Glucose, Bld: 127 mg/dL — ABNORMAL HIGH (ref 70–99)
Glucose, Bld: 113 mg/dL — ABNORMAL HIGH (ref 70–99)
Potassium: 4.3 mmol/L (ref 3.5–5.1)
Potassium: 3.7 mmol/L (ref 3.5–5.1)
Sodium: 145 mmol/L (ref 135–145)
Sodium: 140 mmol/L (ref 135–145)

## 2023-09-26 LAB — CBC
HCT: 37.1 % — ABNORMAL LOW (ref 39.0–52.0)
Hemoglobin: 12 g/dL — ABNORMAL LOW (ref 13.0–17.0)
MCH: 28.2 pg (ref 26.0–34.0)
MCHC: 32.3 g/dL (ref 30.0–36.0)
MCV: 87.3 fL (ref 80.0–100.0)
Platelets: 249 10*3/uL (ref 150–400)
RBC: 4.25 MIL/uL (ref 4.22–5.81)
RDW: 15.9 % — ABNORMAL HIGH (ref 11.5–15.5)
WBC: 14.5 10*3/uL — ABNORMAL HIGH (ref 4.0–10.5)
nRBC: 0 % (ref 0.0–0.2)

## 2023-09-26 MED ORDER — FUROSEMIDE 20 MG PO TABS: 20.0000 mg | ORAL_TABLET | Freq: Every day | ORAL | 0 refills | Status: DC | PRN

## 2023-09-26 MED ORDER — SODIUM CHLORIDE 0.9 % IV BOLUS
500.0000 mL | Freq: Once | INTRAVENOUS | Status: AC
  Administered 2023-09-26: 500 mL via INTRAVENOUS

## 2023-09-26 MED ORDER — AMLODIPINE BESYLATE 10 MG PO TABS: 10.0000 mg | ORAL_TABLET | Freq: Every day | ORAL | 1 refills | Status: AC

## 2023-09-26 MED ORDER — ACETAMINOPHEN 325 MG PO TABS: 650.0000 mg | ORAL_TABLET | Freq: Four times a day (QID) | ORAL | 0 refills | Status: AC | PRN

## 2023-09-26 NOTE — Discharge Summary (Addendum)
Physician Discharge Summary   Patient: Donald [name] MRN: [id number] DOB: 1933-02-02  Admit date:     09/22/2023  Discharge date: 09/26/23  Discharge Physician: Enedina Finner   PCP: Ricky Ala, NP   Recommendations at discharge:    F/u Dr Juliann Pares in 1-2 weeks F/u PCPin 1-2 weeks Take lasix 20 gm (water pill) as needed for SOB or leg edema  Discharge Diagnoses: Principal Problem:   Hypertensive emergency Active Problems:   Acute CHF (congestive heart failure) (HCC)   Coronary artery disease involving native coronary artery of native heart without angina pectoris   Elevated troponin   COPD   Atrial fibrillation, chronic (HCC)   Long term current use of anticoagulant   CLL  Donald [name] is a 89 y.o. male  with medical history significant for COPD, no longer on home O2, HTN, A flutter on Eliquis, CLL, lung cancer s/p radiation, right ICA stenosis, BPH, paronychia and onychomycosis of left great toe, who presented to the hospital with right-sided chest pain and shortness of breath with exertion for about 3 days duration.  He also reported swelling in his legs.  Reportedly, oxygen saturation dropped into the 80s.   In the ED, he was afebrile, BP was up to 220/81, tachypneic with respiratory rate between 20 and 30, oxygen saturation went down to 85% on room air He was admitted to the hospital for acute CHF exacerbation.   Acute on chronic diastolic CHF exacerbation elevated troponin suspect demand ischemia in the setting of CHF  received IV Lasix per cardiology team.  Monitor BMP, daily weight and urine output.  BNP 419. 2D echo EF 65 to 70% with great toe diastolic dysfunction -- discontinued Lasix due to elevated creatinine--will give it as prn -- d/c lisinopril -- cardiology increased amlodipine 10 mg --on RA--f/u dr Juliann Pares as out pt   Right-sided chest pain, mildly elevated troponins, underlying CAD: Chest pain has resolved.  Elevated troponins likely due to  demand ischemia. --Patient was evaluated by the cardiologist.  No plan for cardiac catheterization.    Hypertensive emergency: IV nitroglycerin drip has been discontinued.   --Continue oral antihypertensives metoprolol and amlodipine.  -- Lisinopril discontinue due to elevated creatinine   acute on CKD stage IIIa. Likely due to over diuresis --recieved NS 500 cc--creat going down    Acute hypoxemic respiratory failure: Improved.    atrial fibrillation and atrial flutter.  --Continue Eliquis and metoprolol    Acute metabolic encephalopathy, underlying memory/cognitive impairment: Continue supportive care -- MRI brain negative for stroke    COPD: Compensated.  Continue bronchodilators    Chronic lymphocytic leukemia: His daughter confirmed that he is going to start the new medicine for this but his oncologist wanted to wait until acute issues are over. Follow-up with oncologist at the [facility] clinic.   PT OT recommends HHPT today Overall feels ok    Family communication : dter patricia on the phone Consults : cardiology CODE STATUS: full DVT Prophylaxis : eliquis     Disposition: Home health Diet recommendation:  Discharge Diet Orders (From admission, onward)     Start     Ordered   09/26/23 0000  Diet - low sodium heart healthy        09/26/23 1633           Cardiac diet DISCHARGE MEDICATION: Allergies as of 09/26/2023       Reactions   Lovastatin Other (See Comments)   Cephalexin Rash, Hives  Medication List     STOP taking these medications    carvedilol 12.5 MG tablet Commonly known as: COREG   cholecalciferol 1000 units tablet Commonly known as: VITAMIN D   hydrALAZINE 25 MG tablet Commonly known as: APRESOLINE   lisinopril 10 MG tablet Commonly known as: ZESTRIL   LYCOPENE PO   polyethylene glycol 17 g packet Commonly known as: MIRALAX / GLYCOLAX   sodium phosphate 7-19 GM/118ML Enem   valACYclovir 500 MG tablet Commonly known  as: VALTREX       TAKE these medications    acetaminophen 325 MG tablet Commonly known as: TYLENOL Take 2 tablets (650 mg total) by mouth every 6 (six) hours as needed. What changed:  when to take this reasons to take this   albuterol 108 (90 Base) MCG/ACT inhaler Commonly known as: VENTOLIN HFA Inhale 2 puffs into the lungs every 6 (six) hours as needed for wheezing or shortness of breath.   amLODipine 10 MG tablet Commonly known as: NORVASC Take 1 tablet (10 mg total) by mouth daily. Start taking on: September 27, 2023 What changed:  medication strength how much to take   apixaban 2.5 MG Tabs tablet Commonly known as: ELIQUIS Take 1 tablet (2.5 mg total) by mouth 2 (two) times daily.   atorvastatin 80 MG tablet Commonly known as: LIPITOR Take 40 mg by mouth at bedtime.   DULoxetine 30 MG capsule Commonly known as: CYMBALTA Take 30 mg by mouth 2 (two) times daily.   famotidine 20 MG tablet Commonly known as: PEPCID Take 20 mg by mouth daily as needed for indigestion or heartburn.   finasteride 5 MG tablet Commonly known as: PROSCAR Take 5 mg by mouth daily.   furosemide 20 MG tablet Commonly known as: Lasix Take 1 tablet (20 mg total) by mouth daily as needed. For SHortness of breath or leg edema   guaifenesin 400 MG Tabs tablet Commonly known as: HUMIBID E Take 400 mg by mouth 2 (two) times daily as needed (cough).   ipratropium 0.03 % nasal spray Commonly known as: ATROVENT Place 2 sprays into both nostrils daily.   Mega Multivitamin for Men Tabs Take 1 tablet by mouth daily.   metoprolol tartrate 25 MG tablet Commonly known as: LOPRESSOR Take 25 mg by mouth 2 (two) times daily.   senna-docusate 8.6-50 MG tablet Commonly known as: Senokot-S Take 1 tablet by mouth 2 (two) times daily between meals as needed for mild constipation.   tamsulosin 0.4 MG Caps capsule Commonly known as: FLOMAX Take 0.4 mg by mouth every evening.   Tiotropium  Bromide-Olodaterol 2.5-2.5 MCG/ACT Aers Inhale 2 puffs into the lungs daily.               Durable Medical Equipment  (From admission, onward)           Start     Ordered   09/26/23 1240  For home use only DME Bedside commode  Once       Question:  Patient needs a bedside commode to treat with the following condition  Answer:  Weakness   09/26/23 1239            Follow-up Information     Callwood, Gerda Diss D, MD. Go in 1 week(s).   Specialties: Cardiology, Internal Medicine Contact information: [address] [place] [address] [phone]         Ricky Ala, NP. Schedule an appointment as soon as possible for a visit in 1 week(s).  Specialty: Nurse Practitioner Contact information: [address] [place] [address] [phone]                Discharge Exam: Ceasar Mons Weights   09/22/23 1826  Weight: 97.1 kg     Condition at discharge: fair  The results of significant diagnostics from this hospitalization (including imaging, microbiology, ancillary and laboratory) are listed below for reference.   Imaging Studies: MR BRAIN WO CONTRAST  Result Date: 09/25/2023 CLINICAL DATA:  Neuro deficit, stroke suspected EXAM: MRI HEAD WITHOUT CONTRAST TECHNIQUE: Multiplanar, multiecho pulse sequences of the brain and surrounding structures were obtained without intravenous contrast. COMPARISON:  12/31/2018 MRI head, correlation is also made with 04/15/2022 CT head FINDINGS: Brain: No restricted diffusion to suggest acute or subacute infarct. No acute hemorrhage, mass, mass effect, or midline shift. No hydrocephalus or extra-axial collection. Pituitary and craniocervical junction within normal limits. No hemosiderin deposition to suggest remote hemorrhage. Remote lacunar infarcts in the left cerebellum and right thalamus are new from the prior MRI. Additional remote lacunar infarcts in the right basal ganglia and corona radiata are unchanged. T2  hyperintense signal in the periventricular white matter, likely the sequela of chronic small vessel ischemic disease. Vascular: Normal arterial flow voids. Skull and upper cervical spine: Normal marrow signal. Sinuses/Orbits: Mucosal thickening in the left maxillary sinus. Status post bilateral lens replacements. Other: Trace fluid in the mastoid air cells. IMPRESSION: No acute intracranial process. No evidence of acute or subacute infarct. Electronically Signed   By: Wiliam Ke M.D.   On: 09/25/2023 14:52   ECHOCARDIOGRAM COMPLETE  Result Date: 09/24/2023    ECHOCARDIOGRAM REPORT   Patient Name:   URYAH CLENDANIEL Date of Exam: 09/24/2023 Medical Rec #:  [id number]            Height:       68.0 in Accession #:    [id number]           Weight:       214.0 lb Date of Birth:  08/26/1933            BSA:          2.103 m Patient Age:    89 years             BP:           114/46 mmHg Patient Gender: M                    HR:           77 bpm. Exam Location:  [facility] Procedure: 2D Echo, Cardiac Doppler and Color Doppler Indications:     CHF  History:         Patient has no prior history of Echocardiogram examinations.                  CHF, CAD, Stroke and COPD, Arrythmias:Atrial Fibrillation and                  Bradycardia, Signs/Symptoms:Dizziness/Lightheadedness and                  Shortness of Breath; Risk Factors:Hypertension, Diabetes and                  Former Smoker. Lung CA, CKD, Dementia.  Sonographer:     Mikki Harbor Referring Phys:  [id number] CARALYN HUDSON Diagnosing Phys: Alwyn Pea MD  Sonographer Comments: Technically difficult study due to poor echo windows and suboptimal parasternal  window. Image acquisition challenging due to respiratory motion. IMPRESSIONS  1. Left ventricular ejection fraction, by estimation, is 65 to 70%. The left ventricle has normal function. The left ventricle has no regional wall motion abnormalities. Left ventricular diastolic parameters are consistent with  Grade II diastolic dysfunction (pseudonormalization).  2. Right ventricular systolic function is normal. The right ventricular size is normal. There is normal pulmonary artery systolic pressure.  3. The mitral valve is normal in structure. Trivial mitral valve regurgitation.  4. The aortic valve is normal in structure. Aortic valve regurgitation is not visualized. FINDINGS  Left Ventricle: Left ventricular ejection fraction, by estimation, is 65 to 70%. The left ventricle has normal function. The left ventricle has no regional wall motion abnormalities. The left ventricular internal cavity size was normal in size. There is  no left ventricular hypertrophy. Left ventricular diastolic parameters are consistent with Grade II diastolic dysfunction (pseudonormalization). Right Ventricle: The right ventricular size is normal. No increase in right ventricular wall thickness. Right ventricular systolic function is normal. There is normal pulmonary artery systolic pressure. The tricuspid regurgitant velocity is 2.27 m/s, and  with an assumed right atrial pressure of 8 mmHg, the estimated right ventricular systolic pressure is 28.6 mmHg. Left Atrium: Left atrial size was normal in size. Right Atrium: Right atrial size was normal in size. Pericardium: There is no evidence of pericardial effusion. Mitral Valve: The mitral valve is normal in structure. Trivial mitral valve regurgitation. MV peak gradient, 3.7 mmHg. The mean mitral valve gradient is 1.0 mmHg. Tricuspid Valve: The tricuspid valve is normal in structure. Tricuspid valve regurgitation is trivial. Aortic Valve: The aortic valve is normal in structure. Aortic valve regurgitation is not visualized. Aortic valve mean gradient measures 5.0 mmHg. Aortic valve peak gradient measures 10.3 mmHg. Aortic valve area, by VTI measures 2.12 cm. Pulmonic Valve: The pulmonic valve was normal in structure. Pulmonic valve regurgitation is not visualized. Aorta: The ascending aorta  was not well visualized. IAS/Shunts: No atrial level shunt detected by color flow Doppler.  LEFT VENTRICLE PLAX 2D LVIDd:         4.40 cm LVIDs:         2.70 cm LV PW:         1.30 cm LV IVS:        1.00 cm LVOT diam:     1.90 cm LV SV:         57 LV SV Index:   27 LVOT Area:     2.84 cm  RIGHT VENTRICLE RV Basal diam:  4.20 cm RV Mid diam:    2.80 cm RV S prime:     10.20 cm/s LEFT ATRIUM             Index        RIGHT ATRIUM           Index LA diam:        4.90 cm 2.33 cm/m   RA Area:     23.10 cm LA Vol (A2C):   94.5 ml 44.93 ml/m  RA Volume:   74.20 ml  35.28 ml/m LA Vol (A4C):   78.1 ml 37.13 ml/m LA Biplane Vol: 88.0 ml 41.84 ml/m  AORTIC VALVE                     PULMONIC VALVE AV Area (Vmax):    2.03 cm      PV Vmax:       0.96 m/s AV Area (  Vmean):   2.16 cm      PV Peak grad:  3.7 mmHg AV Area (VTI):     2.12 cm AV Vmax:           160.50 cm/s AV Vmean:          105.500 cm/s AV VTI:            0.270 m AV Peak Grad:      10.3 mmHg AV Mean Grad:      5.0 mmHg LVOT Vmax:         115.00 cm/s LVOT Vmean:        80.250 cm/s LVOT VTI:          0.202 m LVOT/AV VTI ratio: 0.75  AORTA Ao Root diam: 3.50 cm MITRAL VALVE               TRICUSPID VALVE MV Area (PHT): 4.99 cm    TR Peak grad:   20.6 mmHg MV Area VTI:   2.76 cm    TR Vmax:        227.00 cm/s MV Peak grad:  3.7 mmHg MV Mean grad:  1.0 mmHg    SHUNTS MV Vmax:       0.97 m/s    Systemic VTI:  0.20 m MV Vmean:      53.6 cm/s   Systemic Diam: 1.90 cm MV Decel Time: 152 msec MV E velocity: 66.60 cm/s MV A velocity: 37.70 cm/s MV E/A ratio:  1.77 Dwayne Salome Arnt MD Electronically signed by Alwyn Pea MD Signature Date/Time: 09/24/2023/7:40:38 PM    Final    DG Chest 2 View  Result Date: 09/22/2023 CLINICAL DATA:  Chest pain short of breath EXAM: CHEST - 2 VIEW COMPARISON:  06/13/2022, CT 04/15/2022 FINDINGS: Small bilateral effusions. Cardiomegaly with mild diffuse interstitial opacity. Irregular left apical opacity corresponding to prior  nodule and possible post treatment changes. This appears more confluent in the interim. IMPRESSION: 1. Cardiomegaly with mild diffuse interstitial opacity and small bilateral effusions, suspect for CHF. 2. Irregular left apical opacity corresponding to prior nodule and post treatment changes but radiographically appears more dense and confluent,, suggest correlation with chest CT. Electronically Signed   By: Jasmine Pang M.D.   On: 09/22/2023 22:35    Microbiology: Results for orders placed or performed during the hospital encounter of 06/13/22  SARS Coronavirus 2 by RT PCR (hospital order, performed in [facility] hospital lab) *cepheid single result test* Anterior Nasal Swab     Status: None   Collection Time: 06/13/22  4:54 PM   Specimen: Anterior Nasal Swab  Result Value Ref Range Status   SARS Coronavirus 2 by RT PCR NEGATIVE NEGATIVE Final    Comment: (NOTE) SARS-CoV-2 target nucleic acids are NOT DETECTED.  The SARS-CoV-2 RNA is generally detectable in upper and lower respiratory specimens during the acute phase of infection. The lowest concentration of SARS-CoV-2 viral copies this assay can detect is 250 copies / mL. A negative result does not preclude SARS-CoV-2 infection and should not be used as the sole basis for treatment or other patient management decisions.  A negative result may occur with improper specimen collection / handling, submission of specimen other than nasopharyngeal swab, presence of viral mutation(s) within the areas targeted by this assay, and inadequate number of viral copies (<250 copies / mL). A negative result must be combined with clinical observations, patient history, and epidemiological information.  Fact Sheet for Patients:   RoadLapTop.co.za  Fact  Sheet for Healthcare Providers: http://kim-miller.com/  This test is not yet approved or  cleared by the [place] and has been authorized for  detection and/or diagnosis of SARS-CoV-2 by FDA under an Emergency Use Authorization (EUA).  This EUA will remain in effect (meaning this test can be used) for the duration of the COVID-19 declaration under Section 564(b)(1) of the Act, 21 U.S.C. section 360bbb-3(b)(1), unless the authorization is terminated or revoked sooner.  Performed at [facility], [address] [place], [place] [address]   GENERAL:  89 y.o.-year-old patient with no acute distress.  LUNGS: decreased breath sounds bilaterally, no wheezing CARDIOVASCULAR: S1, S2 normal. No murmur   ABDOMEN: Soft, nontender, nondistended. Bowel sounds present.  EXTREMITIES: No  edema b/l.    NEUROLOGIC: nonfocal  patient is alert and awake  Labs: CBC: Recent Labs  Lab 09/22/23 1828 09/24/23 0410 09/25/23 0423 09/26/23 0348  WBC 11.7* 13.1* 14.7* 14.5*  NEUTROABS  --  5.8 5.4  --   HGB 10.4* 12.4* 12.2* 12.0*  HCT 32.9* 38.6* 38.1* 37.1*  MCV 90.6 88.5 88.2 87.3  PLT 184 223 245 249   Basic Metabolic Panel: Recent Labs  Lab 09/22/23 1828 09/24/23 0410 09/25/23 0423 09/26/23 0348 09/26/23 1434  NA 142 140 141 145 140  K 3.5 3.5 2.9* 4.3 3.7  CL 109 102 104 109 107  CO2 24 24 23 23  20*  GLUCOSE 135* 120* 116* 127* 113*  BUN 17 24* 38* 52* 46*  CREATININE 1.23 1.41* 1.92* 2.09* 1.80*  CALCIUM 8.8* 8.9 8.9 9.1 8.7*   Discharge time spent: greater than 30 minutes.  Signed: Enedina Finner, MD [facility] 09/26/2023

## 2023-09-26 NOTE — Progress Notes (Signed)
Physical Therapy Treatment Patient Details Name: Donald [name] MRN: [id number] DOB: 1932-12-11 Today's Date: 09/26/2023   History of Present Illness Pt is an 89 y.o. male presenting to hospital 09/22/23 with c/o SOB, cough, and intermittent chest tightness.  Pt admitted with acute CHF, hypertensive emergency, elevated troponin (suspect demand ischemia per MD note), chronic a-fib.  PMH includes CLL (in remission), htn, HLD, COPD, a-flutter on Eliquis, lung CA s/p radiation, R ICA stenosis, BPH, neuropathy B feet. PTSD, urge incontinence, paronychia and onychomycosis of L great toe.    PT Comments  Pt alert, oriented, denies pain, is in good spirits, and very receptive to PT session today. Pt demonstrated mod I with STS from recliner with RW. Pt amb 160' with RW and CGA, no LOB noted and no postural deviations observed this session vs eval. Pt able to sing and talk t/o ambulation without SOB, SpO2 94% on room air post ambulation sitting in recliner. B/L ankle DF strength assessed in sitting (R 4/5, L 3-/5). Pt left with all needs met and all questions answered regarding safety with mobility at home. Pt would benefit from continued therapy to address remaining functional deficits and L-sided weakness. D/c recommendation updated- MD and TOC notified.   If plan is discharge home, recommend the following: A little help with walking and/or transfers;A little help with bathing/dressing/bathroom;Assist for transportation;Assistance with cooking/housework   Can travel by private vehicle     Yes  Equipment Recommendations  BSC/3in1 (Pt states he has 2WW at home)    Recommendations for Other Services       Precautions / Restrictions Precautions Precautions: Fall Restrictions Weight Bearing Restrictions: No     Mobility  Bed Mobility               General bed mobility comments: Deferred (pt in recliner beginning/end of session)    Transfers Overall transfer level: Modified  independent Equipment used: Rolling walker (2 wheels) Transfers: Sit to/from Stand Sit to Stand: Modified independent (Device/Increase time)                Ambulation/Gait Ambulation/Gait assistance: Contact guard assist Gait Distance (Feet): 160 Feet Assistive device: Rolling walker (2 wheels) Gait Pattern/deviations: WFL(Within Functional Limits)       General Gait Details: pt ambulated safely with RW today, no signifiacnt gait impairments observed; O2 94% after ambulation, no SOB   Stairs             Wheelchair Mobility     Tilt Bed    Modified Rankin (Stroke Patients Only)       Balance Overall balance assessment: Needs assistance Sitting-balance support: Feet supported, No upper extremity supported Sitting balance-Leahy Scale: Normal     Standing balance support: Bilateral upper extremity supported, During functional activity Standing balance-Leahy Scale: Fair                              Cognition Arousal: Alert Behavior During Therapy: WFL for tasks assessed/performed Overall Cognitive Status: Within Functional Limits for tasks assessed                                 General Comments: alert and oriented, able to follow commands, and in good spirits        Exercises      General Comments  B/L ankle DF MMT tested sitting in recliner: R 4/5, L 3-/5  Pertinent Vitals/Pain Pain Assessment Pain Assessment: No/denies pain           PT Goals (current goals can now be found in the care plan section) Acute Rehab PT Goals Patient Stated Goal: to improve strength and balance PT Goal Formulation: With patient/family Time For Goal Achievement: 10/09/23 Potential to Achieve Goals: Good Progress towards PT goals: Progressing toward goals    Frequency    Min 1X/week      PT Plan      Co-evaluation              AM-PAC PT "6 Clicks" Mobility   Outcome Measure  Help needed turning from your back  to your side while in a flat bed without using bedrails?: A Little Help needed moving from lying on your back to sitting on the side of a flat bed without using bedrails?: A Little Help needed moving to and from a bed to a chair (including a wheelchair)?: A Little Help needed standing up from a chair using your arms (e.g., wheelchair or bedside chair)?: None Help needed to walk in hospital room?: A Little Help needed climbing 3-5 steps with a railing? : A Little 6 Click Score: 19    End of Session Equipment Utilized During Treatment: Gait belt Activity Tolerance: Patient tolerated treatment well Patient left: in chair;with call bell/phone within reach;with chair alarm set;with family/visitor present Nurse Communication: Mobility status (MD notified on d/c update) PT Visit Diagnosis: Unsteadiness on feet (R26.81);Other abnormalities of gait and mobility (R26.89);Muscle weakness (generalized) (M62.81);History of falling (Z91.81)     Time: 1478-2956 PT Time Calculation (min) (ACUTE ONLY): 13 min  Charges:    $Gait Training: 8-22 mins PT General Charges $$ ACUTE PT VISIT: 1 Visit                        Shauna Hugh, SPT 09/26/2023, 11:35 AM

## 2023-09-26 NOTE — TOC Progression Note (Signed)
Transition of Care [facility]) - Progression Note    Patient Details  Name: Donald [name] MRN: [id number] Date of Birth: [birth year]  Transition of [facility]) CM/SW Contact  Truddie Hidden, RN Phone Number: 09/26/2023, 9:50 AM  Clinical Narrative:    Spoke with patient regarding therapy's recommendation for SNF. He does wish to go to a SNF but is agreeable to [facility]. He does not have a preference of an agency. He is requesting for his daughter, Elease Hashimoto to be contacted by the accepting [facility] agency.   Referral made to [facility] from [place].          Expected Discharge Plan and Services                                               Social Determinants of Health (SDOH) Interventions SDOH Screenings   Food Insecurity: No Food Insecurity (10/21/2018)  Housing: Low Risk  (09/24/2023)  Transportation Needs: No Transportation Needs (09/24/2023)  Alcohol Screen: Low Risk  (09/24/2023)  Depression (PHQ2-9): Low Risk  (05/31/2021)  Financial Resource Strain: Low Risk  (09/24/2023)  Physical Activity: Insufficiently Active (05/24/2020)  Social Connections: Unknown (05/24/2020)  Stress: No Stress Concern Present (05/24/2020)  Tobacco Use: Medium Risk (09/22/2023)    Readmission Risk Interventions     No data to display

## 2023-09-26 NOTE — Progress Notes (Signed)
PHARMACY CONSULT NOTE - ELECTROLYTES  Pharmacy Consult for Electrolyte Monitoring and Replacement   Recent Labs: Height: 5\' 8"  (172.7 cm) Weight: 97.1 kg (214 lb) IBW/kg (Calculated) : 68.4 Estimated Creatinine Clearance: 27.6 mL/min (A) (by C-G formula based on SCr of 2.09 mg/dL (H)). Potassium (mmol/L)  Date Value  09/26/2023 4.3  09/08/2013 4.2   Magnesium (mg/dL)  Date Value  16/08/9603 2.3   Calcium (mg/dL)  Date Value  54/07/8118 9.1   Calcium, Total (mg/dL)  Date Value  14/78/2956 9.2   Albumin (g/dL)  Date Value  21/30/8657 3.5  09/08/2013 4.1   Phosphorus (mg/dL)  Date Value  84/69/6295 4.5   Sodium (mmol/L)  Date Value  09/26/2023 145  09/08/2013 135 (L)    Assessment  Donald [name] is a 89 y.o. male presenting with SOB and chest pain. PMH significant for coronary artery disease by CT, atrial fibrillation/flutter, COPD, CLL, peripheral vascular disease, hypertension, hyperlipidemia. Pharmacy has been consulted to monitor and replace electrolytes.  Diet: PO sodium restricted diet MIVF: None Pertinent medications: Lisinopril  Goal of Therapy: Electrolytes WNL  Plan:  No supplementation needed at this time Check BMP, Mg, Phos with AM labs- want to make sure potassium remains WNL after level of 2.9 on 11/14  Thank you for allowing pharmacy to be a part of this patient's care.  Merryl Hacker, PharmD Clinical Pharmacist 09/26/2023 7:14 AM

## 2023-09-26 NOTE — Progress Notes (Signed)
[facility] Cardiology  CARDIOLOGY PROGRESS NOTE  Patient ID: Donald [name] MRN: [id number] DOB/AGE: 1933-10-18 89 y.o.  Admit date: 09/22/2023 Referring Physician Dr. Allena Katz Primary Cardiologist [facility] cardiology Reason for Consultation heart failure  HPI: 89 year old male with past medical history of CAD on CT, A-fib, COPD, hypertension, hyperlipidemia who presented to hospital with shortness of breath, managed for hypertensive urgency and heart failure with preserved EF.  Review of systems complete and found to be negative unless listed above     Past Medical History:  Diagnosis Date   Anemia    Arthritis    BPH (benign prostatic hyperplasia)    Cancer (HCC)    Lung   COPD (chronic obstructive pulmonary disease) (HCC)    Emphysema of lung (HCC)    Emphysema of lung (HCC)    Esophageal reflux    Hyperlipidemia    Hypertension    Internal carotid artery stenosis, right    Neuropathy of both feet    Pneumonia    recurrent   PTSD (post-traumatic stress disorder)    [place] vet   Sebaceous cyst    SOB (shortness of breath) on exertion    TIA (transient ischemic attack)    Urge incontinence     Past Surgical History:  Procedure Laterality Date   CYST EXCISION     buttocks   ELECTROMAGNETIC NAVIGATION BROCHOSCOPY Left 08/23/2019   Procedure: ELECTROMAGNETIC NAVIGATION BRONCHOSCOPY;  Surgeon: Salena Saner, MD;  Location: [facility] ORS;  Service: Cardiopulmonary;  Laterality: Left;   ENDOBRONCHIAL ULTRASOUND N/A 08/23/2019   Procedure: ENDOBRONCHIAL ULTRASOUND;  Surgeon: Salena Saner, MD;  Location: [facility] ORS;  Service: Cardiopulmonary;  Laterality: N/A;   HEMORRHOID SURGERY     shave biopsy  03/13/2020   left neck 03/13/20 [facility] Mikki Santee wart with atypica inflamed +AK no carcinoma    SINUSOTOMY     VIDEO BRONCHOSCOPY WITH ENDOBRONCHIAL NAVIGATION Left 06/26/2020   Procedure: VIDEO BRONCHOSCOPY WITH ENDOBRONCHIAL NAVIGATION;  Surgeon: Salena Saner, MD;   Location: [facility] ORS;  Service: Pulmonary;  Laterality: Left;    Medications Prior to Admission  Medication Sig Dispense Refill Last Dose   acetaminophen (TYLENOL) 325 MG tablet Take 2 tablets (650 mg total) by mouth every 6 (six) hours.   prn at unk   albuterol (VENTOLIN HFA) 108 (90 Base) MCG/ACT inhaler Inhale 2 puffs into the lungs every 6 (six) hours as needed for wheezing or shortness of breath. 18 g 6 prn at unk   apixaban (ELIQUIS) 2.5 MG TABS tablet Take 1 tablet (2.5 mg total) by mouth 2 (two) times daily.   09/22/2023   atorvastatin (LIPITOR) 80 MG tablet Take 40 mg by mouth at bedtime.   Past Week   DULoxetine (CYMBALTA) 30 MG capsule Take 30 mg by mouth 2 (two) times daily.   09/22/2023   famotidine (PEPCID) 20 MG tablet Take 20 mg by mouth daily as needed for indigestion or heartburn.   prn at unk   finasteride (PROSCAR) 5 MG tablet Take 5 mg by mouth daily.   09/22/2023   guaifenesin (HUMIBID E) 400 MG TABS tablet Take 400 mg by mouth 2 (two) times daily as needed (cough).   prn at unk   ipratropium (ATROVENT) 0.03 % nasal spray Place 2 sprays into both nostrils daily.   09/22/2023   lisinopril (ZESTRIL) 10 MG tablet Take 5 mg by mouth daily.   09/22/2023   metoprolol tartrate (LOPRESSOR) 25 MG tablet Take 25 mg by mouth 2 (two)  times daily.   09/22/2023   senna-docusate (SENOKOT-S) 8.6-50 MG tablet Take 1 tablet by mouth 2 (two) times daily between meals as needed for mild constipation. 60 tablet 0 prn at unk   tamsulosin (FLOMAX) 0.4 MG CAPS capsule Take 0.4 mg by mouth every evening.   Past Week   Tiotropium Bromide-Olodaterol 2.5-2.5 MCG/ACT AERS Inhale 2 puffs into the lungs daily.   09/22/2023   valACYclovir (VALTREX) 500 MG tablet Take 500 mg by mouth every other day.   Past Week   amLODipine (NORVASC) 5 MG tablet Take 1 tablet (5 mg total) by mouth daily. (Patient not taking: Reported on 09/23/2023)   Not Taking   carvedilol (COREG) 12.5 MG tablet Take 1 tablet (12.5 mg  total) by mouth 2 (two) times daily with a meal. (Patient not taking: Reported on 09/23/2023)   Not Taking   cholecalciferol (VITAMIN D) 1000 UNITS tablet Take 1,000 Units by mouth daily. (Patient not taking: Reported on 09/23/2023)   Not Taking   hydrALAZINE (APRESOLINE) 25 MG tablet Take 1 tablet (25 mg total) by mouth every 6 (six) hours as needed (SBP>160 or DBP>110). (Patient not taking: Reported on 09/23/2023)   Not Taking   LYCOPENE PO Take 2 tablets by mouth daily. (Patient not taking: Reported on 09/23/2023)   Not Taking   Multiple Vitamins-Minerals (MEGA MULTIVITAMIN FOR MEN) TABS Take 1 tablet by mouth daily.   unk   polyethylene glycol (MIRALAX / GLYCOLAX) 17 g packet Take 17 g by mouth 2 (two) times daily as needed for mild constipation. (Patient not taking: Reported on 09/23/2023) 14 each 0 Not Taking   sodium phosphate (FLEET) 7-19 GM/118ML ENEM Place 133 mLs (1 enema total) rectally daily as needed for severe constipation. (Patient not taking: Reported on 09/23/2023)  0 Not Taking   Social History   Socioeconomic History   Marital status: Married    Spouse name: Cassandria Anger   Number of children: 2   Years of education: Not on file   Highest education level: Some college, no degree  Occupational History   Not on file  Tobacco Use   Smoking status: Former    Current packs/day: 0.00    Average packs/day: 1.5 packs/day for 50.0 years (75.0 ttl pk-yrs)    Types: Cigarettes    Start date: 09/09/1949    Quit date: 09/10/1999    Years since quitting: 24.0   Smokeless tobacco: Never  Vaping Use   Vaping status: Never Used  Substance and Sexual Activity   Alcohol use: No    Alcohol/week: 0.0 standard drinks of alcohol    Comment: not since 12/1980   Drug use: No   Sexual activity: Yes  Other Topics Concern   Not on file  Social History Narrative   Lives in [place] with wife. Dog and cat in home.      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  Recruitment consultant.      Diet -  regular      Exercise - Walking   Social Determinants of Health   Financial Resource Strain: Low Risk  (09/24/2023)   Overall Financial Resource Strain (CARDIA)    Difficulty of Paying Living Expenses: Not hard at all  Food Insecurity: No Food Insecurity (10/21/2018)   Hunger Vital Sign    Worried About Running Out of Food in the Last Year: Never true    Ran Out of Food in the Last Year: Never true  Transportation Needs: No Transportation Needs (09/24/2023)  PRAPARE - Administrator, Civil Service (Medical): No    Lack of Transportation (Non-Medical): No  Physical Activity: Insufficiently Active (05/24/2020)   Exercise Vital Sign    Days of Exercise per Week: 5 days    Minutes of Exercise per Session: 20 min  Stress: No Stress Concern Present (05/24/2020)   [employer] of Occupational Health - Occupational Stress Questionnaire    Feeling of Stress : Not at all  Social Connections: Unknown (05/24/2020)   Social Connection and Isolation Panel [NHANES]    Frequency of Communication with Friends and Family: Not on file    Frequency of Social Gatherings with Friends and Family: Not on file    Attends Religious Services: Not on file    Active Member of Clubs or Organizations: Not on file    Attends Banker Meetings: Not on file    Marital Status: Married  Intimate Partner Violence: Not At Risk (05/24/2020)   Humiliation, Afraid, Rape, and Kick questionnaire    Fear of Current or Ex-Partner: No    Emotionally Abused: No    Physically Abused: No    Sexually Abused: No    Family History  Problem Relation Age of Onset   Heart disease Mother    Cancer Maternal Aunt        lung   Lung cancer Maternal Aunt    Leukemia Maternal Aunt    Hypertension Son       Review of systems complete and found to be negative unless listed above      PHYSICAL EXAM  General: Well developed, well nourished, in no acute distress HEENT:  Normocephalic and  atramatic Neck:  No JVD.  Lungs: Clear Heart: Grade 2 systolic murmur left lower sternal border  Labs:   Lab Results  Component Value Date   WBC 14.5 (H) 09/26/2023   HGB 12.0 (L) 09/26/2023   HCT 37.1 (L) 09/26/2023   MCV 87.3 09/26/2023   PLT 249 09/26/2023    Recent Labs  Lab 09/26/23 0348  NA 145  K 4.3  CL 109  CO2 23  BUN 52*  CREATININE 2.09*  CALCIUM 9.1  GLUCOSE 127*   Lab Results  Component Value Date   CKTOTAL 193 04/24/2022   CKMB 2.3 09/08/2013   TROPONINI <0.03 02/17/2019    Lab Results  Component Value Date   CHOL 133 06/01/2021   CHOL 192 03/06/2020   CHOL 155 02/24/2019   Lab Results  Component Value Date   HDL 39.00 (L) 06/01/2021   HDL 52.30 03/06/2020   HDL 47.40 02/24/2019   Lab Results  Component Value Date   LDLCALC 63 06/01/2021   LDLCALC 105 (H) 03/06/2020   LDLCALC 83 11/17/2015   Lab Results  Component Value Date   TRIG 151.0 (H) 06/01/2021   TRIG 172.0 (H) 03/06/2020   TRIG 276.0 (H) 02/24/2019   Lab Results  Component Value Date   CHOLHDL 3 06/01/2021   CHOLHDL 4 03/06/2020   CHOLHDL 3 02/24/2019   Lab Results  Component Value Date   LDLDIRECT 57.0 02/24/2019   LDLDIRECT 88.0 10/09/2017   LDLDIRECT 110.3 06/20/2014      Radiology: MR BRAIN WO CONTRAST  Result Date: 09/25/2023 CLINICAL DATA:  Neuro deficit, stroke suspected EXAM: MRI HEAD WITHOUT CONTRAST TECHNIQUE: Multiplanar, multiecho pulse sequences of the brain and surrounding structures were obtained without intravenous contrast. COMPARISON:  12/31/2018 MRI head, correlation is also made with 04/15/2022 CT head FINDINGS: Brain: No restricted  diffusion to suggest acute or subacute infarct. No acute hemorrhage, mass, mass effect, or midline shift. No hydrocephalus or extra-axial collection. Pituitary and craniocervical junction within normal limits. No hemosiderin deposition to suggest remote hemorrhage. Remote lacunar infarcts in the left cerebellum and right  thalamus are new from the prior MRI. Additional remote lacunar infarcts in the right basal ganglia and corona radiata are unchanged. T2 hyperintense signal in the periventricular white matter, likely the sequela of chronic small vessel ischemic disease. Vascular: Normal arterial flow voids. Skull and upper cervical spine: Normal marrow signal. Sinuses/Orbits: Mucosal thickening in the left maxillary sinus. Status post bilateral lens replacements. Other: Trace fluid in the mastoid air cells. IMPRESSION: No acute intracranial process. No evidence of acute or subacute infarct. Electronically Signed   By: Wiliam Ke M.D.   On: 09/25/2023 14:52   ECHOCARDIOGRAM COMPLETE  Result Date: 09/24/2023    ECHOCARDIOGRAM REPORT   Patient Name:   DEVINN LOSCALZO Date of Exam: 09/24/2023 Medical Rec #:  [id number]            Height:       68.0 in Accession #:    [id number]           Weight:       214.0 lb Date of Birth:  05/22/1933            BSA:          2.103 m Patient Age:    89 years             BP:           114/46 mmHg Patient Gender: M                    HR:           77 bpm. Exam Location:  [facility] Procedure: 2D Echo, Cardiac Doppler and Color Doppler Indications:     CHF  History:         Patient has no prior history of Echocardiogram examinations.                  CHF, CAD, Stroke and COPD, Arrythmias:Atrial Fibrillation and                  Bradycardia, Signs/Symptoms:Dizziness/Lightheadedness and                  Shortness of Breath; Risk Factors:Hypertension, Diabetes and                  Former Smoker. Lung CA, CKD, Dementia.  Sonographer:     Mikki Harbor Referring Phys:  [id number] CARALYN HUDSON Diagnosing Phys: Alwyn Pea MD  Sonographer Comments: Technically difficult study due to poor echo windows and suboptimal parasternal window. Image acquisition challenging due to respiratory motion. IMPRESSIONS  1. Left ventricular ejection fraction, by estimation, is 65 to 70%. The left ventricle has  normal function. The left ventricle has no regional wall motion abnormalities. Left ventricular diastolic parameters are consistent with Grade II diastolic dysfunction (pseudonormalization).  2. Right ventricular systolic function is normal. The right ventricular size is normal. There is normal pulmonary artery systolic pressure.  3. The mitral valve is normal in structure. Trivial mitral valve regurgitation.  4. The aortic valve is normal in structure. Aortic valve regurgitation is not visualized. FINDINGS  Left Ventricle: Left ventricular ejection fraction, by estimation, is 65 to 70%. The left ventricle has normal function. The left ventricle has no regional  wall motion abnormalities. The left ventricular internal cavity size was normal in size. There is  no left ventricular hypertrophy. Left ventricular diastolic parameters are consistent with Grade II diastolic dysfunction (pseudonormalization). Right Ventricle: The right ventricular size is normal. No increase in right ventricular wall thickness. Right ventricular systolic function is normal. There is normal pulmonary artery systolic pressure. The tricuspid regurgitant velocity is 2.27 m/s, and  with an assumed right atrial pressure of 8 mmHg, the estimated right ventricular systolic pressure is 28.6 mmHg. Left Atrium: Left atrial size was normal in size. Right Atrium: Right atrial size was normal in size. Pericardium: There is no evidence of pericardial effusion. Mitral Valve: The mitral valve is normal in structure. Trivial mitral valve regurgitation. MV peak gradient, 3.7 mmHg. The mean mitral valve gradient is 1.0 mmHg. Tricuspid Valve: The tricuspid valve is normal in structure. Tricuspid valve regurgitation is trivial. Aortic Valve: The aortic valve is normal in structure. Aortic valve regurgitation is not visualized. Aortic valve mean gradient measures 5.0 mmHg. Aortic valve peak gradient measures 10.3 mmHg. Aortic valve area, by VTI measures 2.12 cm.  Pulmonic Valve: The pulmonic valve was normal in structure. Pulmonic valve regurgitation is not visualized. Aorta: The ascending aorta was not well visualized. IAS/Shunts: No atrial level shunt detected by color flow Doppler.  LEFT VENTRICLE PLAX 2D LVIDd:         4.40 cm LVIDs:         2.70 cm LV PW:         1.30 cm LV IVS:        1.00 cm LVOT diam:     1.90 cm LV SV:         57 LV SV Index:   27 LVOT Area:     2.84 cm  RIGHT VENTRICLE RV Basal diam:  4.20 cm RV Mid diam:    2.80 cm RV S prime:     10.20 cm/s LEFT ATRIUM             Index        RIGHT ATRIUM           Index LA diam:        4.90 cm 2.33 cm/m   RA Area:     23.10 cm LA Vol (A2C):   94.5 ml 44.93 ml/m  RA Volume:   74.20 ml  35.28 ml/m LA Vol (A4C):   78.1 ml 37.13 ml/m LA Biplane Vol: 88.0 ml 41.84 ml/m  AORTIC VALVE                     PULMONIC VALVE AV Area (Vmax):    2.03 cm      PV Vmax:       0.96 m/s AV Area (Vmean):   2.16 cm      PV Peak grad:  3.7 mmHg AV Area (VTI):     2.12 cm AV Vmax:           160.50 cm/s AV Vmean:          105.500 cm/s AV VTI:            0.270 m AV Peak Grad:      10.3 mmHg AV Mean Grad:      5.0 mmHg LVOT Vmax:         115.00 cm/s LVOT Vmean:        80.250 cm/s LVOT VTI:          0.202 m LVOT/AV  VTI ratio: 0.75  AORTA Ao Root diam: 3.50 cm MITRAL VALVE               TRICUSPID VALVE MV Area (PHT): 4.99 cm    TR Peak grad:   20.6 mmHg MV Area VTI:   2.76 cm    TR Vmax:        227.00 cm/s MV Peak grad:  3.7 mmHg MV Mean grad:  1.0 mmHg    SHUNTS MV Vmax:       0.97 m/s    Systemic VTI:  0.20 m MV Vmean:      53.6 cm/s   Systemic Diam: 1.90 cm MV Decel Time: 152 msec MV E velocity: 66.60 cm/s MV A velocity: 37.70 cm/s MV E/A ratio:  1.77 Dwayne Salome Arnt MD Electronically signed by Alwyn Pea MD Signature Date/Time: 09/24/2023/7:40:38 PM    Final    DG Chest 2 View  Result Date: 09/22/2023 CLINICAL DATA:  Chest pain short of breath EXAM: CHEST - 2 VIEW COMPARISON:  06/13/2022, CT 04/15/2022  FINDINGS: Small bilateral effusions. Cardiomegaly with mild diffuse interstitial opacity. Irregular left apical opacity corresponding to prior nodule and possible post treatment changes. This appears more confluent in the interim. IMPRESSION: 1. Cardiomegaly with mild diffuse interstitial opacity and small bilateral effusions, suspect for CHF. 2. Irregular left apical opacity corresponding to prior nodule and post treatment changes but radiographically appears more dense and confluent,, suggest correlation with chest CT. Electronically Signed   By: Jasmine Pang M.D.   On: 09/22/2023 22:35     ASSESSMENT AND PLAN:  Heart failure with preserved EF Hypertensive urgency Type II mild flat troponin elevation Atrial fibrillation AKI on CKD  Continue to hold diuretics with worsening renal function.  Avoid nephrotoxic agents.  Will stop lisinopril.  Continue amlodipine.  Can add p.o. hydralazine if needed for blood pressure.  Currently blood pressure in normal range. Continue p.o. metoprolol, heart rate well-controlled.  Continue Eliquis for anticoagulation He would need as needed Lasix at discharge.  Monitor until renal function started to improve. Cardiology follow-up as outpatient Will sign off, call with questions  Signed: Kathryne Gin MD,PhD, [facility] 09/26/2023, 12:25 PM

## 2023-09-26 NOTE — Plan of Care (Signed)
  Problem: Education: Goal: Knowledge of General Education information will improve Description: Including pain rating scale, medication(s)/side effects and non-pharmacologic comfort measures Outcome: Progressing   Problem: Clinical Measurements: Goal: Will remain free from infection Outcome: Progressing   Problem: Activity: Goal: Risk for activity intolerance will decrease Outcome: Progressing   Problem: Coping: Goal: Level of anxiety will decrease Outcome: Progressing   Problem: Elimination: Goal: Will not experience complications related to bowel motility Outcome: Progressing   Problem: Elimination: Goal: Will not experience complications related to urinary retention Outcome: Progressing   Problem: Pain Management: Goal: General experience of comfort will improve Outcome: Progressing   Problem: Safety: Goal: Ability to remain free from injury will improve Outcome: Progressing

## 2023-09-26 NOTE — Evaluation (Signed)
Occupational Therapy Evaluation Patient Details Name: Donald [name] MRN: [id number] DOB: [birth year] Today's Date: 09/26/2023   History of Present Illness Pt is an 89 y.o. male presenting to hospital 09/22/23 with c/o SOB, cough, and intermittent chest tightness.  Pt admitted with acute CHF, hypertensive emergency, elevated troponin (suspect demand ischemia per MD note), chronic a-fib.  PMH includes CLL (in remission), htn, HLD, COPD, a-flutter on Eliquis, lung CA s/p radiation, R ICA stenosis, BPH, neuropathy B feet. PTSD, urge incontinence, paronychia and onychomycosis of L great toe.   Clinical Impression   Pt seen for OT evaluation this date.  Pt pleasant, cooperative, appeared to be a good historian, endorsing some struggles with balance and weakness during his PT evaluation the other day.  Pt verbalized feeling back to himself this morning.  Noted all functional transfers from bed, to toilet, and from recliner with modified indep this date.  BUEs 5/5; good sitting balance, and able to maintain standing balance with RW and good ability to release 1 hand from walker to perform ADLs at sink, as well as clothing management for toileting in standing.  Sp02 low 90s on room air following bathroom ADLs.  EC strategies reviewed with good understanding.  No additional AE necessary for ADL completion needed upon d/c.  No ongoing OT needed at this time.  Will reassess as needed if condition changes.  See below for additional session details.        If plan is discharge home, recommend the following: Assist for transportation    Functional Status Assessment  Patient has had a recent decline in their functional status and demonstrates the ability to make significant improvements in function in a reasonable and predictable amount of time.  Equipment Recommendations  None recommended by OT    Recommendations for Other Services       Precautions / Restrictions Precautions Precautions:  Fall Restrictions Weight Bearing Restrictions: No      Mobility Bed Mobility Overal bed mobility: Modified Independent               Patient Response: Cooperative  Transfers Overall transfer level: Modified independent Equipment used: Rolling walker (2 wheels) Transfers: Sit to/from Stand Sit to Stand: Modified independent (Device/Increase time)           General transfer comment: Pt verbalized struggling with transfers during PT eval and having balance impairments.  Pt endorsed feeling back to himself today and demonstrated good safety with all transfers (out of bed, toilet, recliner) using RW without any LOB.      Balance   Sitting-balance support: No upper extremity supported, Feet supported Sitting balance-Leahy Scale: Normal Sitting balance - Comments: Able to sit EOB unsupported and adjust both socks   Standing balance support: Reliant on assistive device for balance, Single extremity supported Standing balance-Leahy Scale: Fair Standing balance comment: Able to manage standing ADLs at toilet and sink with 1 hand on RW or sink countertop for support without LOB, no physical assist from OT.                           ADL either performed or assessed with clinical judgement   ADL Overall ADL's : Modified independent;At baseline                                       General ADL Comments: Able to sit EOB  to adjust socks, ambulatory to bathroom with RW with modified indep.  Pt able to void both urine and BM, perform peri care, toilet transfer, and standing to perform hand hygiene and oral care with modified indep.     Vision Patient Visual Report: No change from baseline                         Pertinent Vitals/Pain Pain Assessment Pain Assessment: No/denies pain     Extremity/Trunk Assessment Upper Extremity Assessment Upper Extremity Assessment: Overall WFL for tasks assessed (BUEs equally 5/5; no L lean noted this  date)   Lower Extremity Assessment Lower Extremity Assessment: Defer to PT evaluation   Cervical / Trunk Assessment Cervical / Trunk Exceptions: forward head/shoulders   Communication Communication Communication: No apparent difficulties Cueing Techniques: Verbal cues   Cognition Arousal: Alert Behavior During Therapy: WFL for tasks assessed/performed Overall Cognitive Status: Within Functional Limits for tasks assessed                                 General Comments: good ability to follow commands and no confusion noted this morning.     General Comments       Exercises Other Exercises Other Exercises: OT educ provided on EC strategies, including use of pacing strategies, sitting to perform ADLs as needed, allowing rest breaks as needed in order to reduce SOB with activity.   Shoulder Instructions      Home Living Family/patient expects to be discharged to:: Private residence Living Arrangements: Spouse/significant other Available Help at Discharge: Family;Available PRN/intermittently Type of Home: House Home Access: Ramped entrance     Home Layout: One level     Bathroom Shower/Tub: Producer, television/film/video: Standard (a second bathroom with an ETS) Bathroom Accessibility: Yes   Home Equipment: Shower seat - built in;Grab bars - tub/shower;Grab bars - toilet;Toilet riser;Rollator (4 wheels)          Prior Functioning/Environment Prior Level of Function : Independent/Modified Independent             Mobility Comments: tends to furniture walk in the home and use rollator for community distances. ADLs Comments: Pt reports he no longer drives.  Able to manage all ADLs with modified indep.        OT Problem List: Decreased activity tolerance;Impaired balance (sitting and/or standing)      OT Treatment/Interventions:      OT Goals(Current goals can be found in the care plan section) Acute Rehab OT Goals Patient Stated Goal: To go  home to spouse OT Goal Formulation: With patient Time For Goal Achievement: 10/10/23 Potential to Achieve Goals: Good ADL Goals Additional ADL Goal #1: Pt will perform basic ADLs with modified indep.  OT Frequency:                    AM-PAC OT "6 Clicks" Daily Activity     Outcome Measure Help from another person eating meals?: None Help from another person taking care of personal grooming?: None Help from another person toileting, which includes using toliet, bedpan, or urinal?: None Help from another person bathing (including washing, rinsing, drying)?: None Help from another person to put on and taking off regular upper body clothing?: None Help from another person to put on and taking off regular lower body clothing?: None 6 Click Score: 24   End of Session Equipment Utilized  During Treatment: Gait belt;Rolling walker (2 wheels) Nurse Communication: Mobility status  Activity Tolerance: Patient tolerated treatment well Patient left: in chair;with call bell/phone within reach (Notified NT and student nurse of pt able to have BM and urinate in commode)  OT Visit Diagnosis: Unsteadiness on feet (R26.81);Muscle weakness (generalized) (M62.81)                Time: 8295-6213 OT Time Calculation (min): 30 min Charges:  OT General Charges $OT Visit: 1 Visit OT Evaluation $OT Eval Low Complexity: 1 Low OT Treatments $Self Care/Home Management : 8-22 mins Donald Earthly, MS, OTR/L Otis Dials 09/26/2023, 10:52 AM

## 2023-09-26 NOTE — TOC Progression Note (Signed)
Transition of Care [facility]) - Progression Note    Patient Details  Name: Donald [name] MRN: [id number] Date of Birth: [birth year]  [facility] CM/SW Contact  Truddie Hidden, RN Phone Number: 09/26/2023, 3:33 PM  Clinical Narrative:    Spoke with patient at bedside regarding discharge plans. Patient advised his referral had been accepted by [facility]. He has refused a BSC.  Spoke with Bes from [facility] she is requesting H&P, progress notes, and TOC notes by faxed to [phone]. Requested documents faxed.         Expected Discharge Plan and Services                                               Social Determinants of Health (SDOH) Interventions SDOH Screenings   Food Insecurity: No Food Insecurity (10/21/2018)  Housing: Low Risk  (09/24/2023)  Transportation Needs: No Transportation Needs (09/24/2023)  Alcohol Screen: Low Risk  (09/24/2023)  Depression (PHQ2-9): Low Risk  (05/31/2021)  Financial Resource Strain: Low Risk  (09/24/2023)  Physical Activity: Insufficiently Active (05/24/2020)  Social Connections: Unknown (05/24/2020)  Stress: No Stress Concern Present (05/24/2020)  Tobacco Use: Medium Risk (09/22/2023)    Readmission Risk Interventions     No data to display

## 2023-09-26 NOTE — Progress Notes (Signed)
Patient is not able to walk the distance required to go the bathroom, or he/she is unable to safely negotiate stairs required to access the bathroom.  A BSC will alleviate this problem.

## 2023-10-19 ENCOUNTER — Emergency Department
Admission: EM | Admit: 2023-10-19 | Discharge: 2023-10-19 | Discharge status: Against Medical Advice | Payer: No Typology Code available for payment source

## 2023-10-19 NOTE — ED Notes (Signed)
Pt daughter reports they are going to go to UC in the AM, pt does not wish to stay with this many "people in the waiting room".

## 2023-12-17 ENCOUNTER — Ambulatory Visit: Payer: Medicare Other | Admitting: Nurse Practitioner

## 2023-12-17 ENCOUNTER — Ambulatory Visit: Payer: Self-pay

## 2023-12-17 ENCOUNTER — Ambulatory Visit
Admission: EM | Admit: 2023-12-17 | Discharge: 2023-12-17 | Disposition: A | Discharge status: Home / Self Care | Payer: No Typology Code available for payment source | Attending: Emergency Medicine | Admitting: Emergency Medicine

## 2023-12-17 DIAGNOSIS — U071 COVID-19: Secondary | ICD-10-CM | POA: Diagnosis not present

## 2023-12-17 LAB — POC COVID19/FLU A&B COMBO
Covid Antigen, POC: POSITIVE — AB
Influenza A Antigen, POC: NEGATIVE
Influenza B Antigen, POC: NEGATIVE

## 2023-12-17 MED ORDER — PREDNISONE 10 MG (21) PO TBPK: ORAL_TABLET | Freq: Every day | ORAL | 0 refills | Status: DC

## 2023-12-17 MED ORDER — PAXLOVID (150/100) 10 X 150 MG & 10 X 100MG PO TBPK: 2.0000 | ORAL_TABLET | Freq: Two times a day (BID) | ORAL | 0 refills | Status: AC
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
## 2023-12-17 NOTE — ED Triage Notes (Signed)
 Patient presents to UC for cough and congestion x 2 days. Treating symptoms with his inhaler. Hx of pneumonia.   Denies fever.

## 2023-12-17 NOTE — Discharge Instructions (Addendum)
 Covid 19 is a virus and should steadily improve in time it can take up to 7 to 10 days before you truly start to see a turnaround however things will get better  Will need to quarantine until without fever for 24 hours  Begin Paxlovid  every morning and every evening for 5 days to reduce the amount of virus in the body which will help to minimize symptoms  If shortness of breath or wheezing worsens begin prednisone  every morning as directed  Stop use of atorvastatin  for 10 days, may begin again on feb 15th     You can take Tylenol  and/or Ibuprofen as needed for fever reduction and pain relief.   For cough: honey 1/2 to 1 teaspoon (you can dilute the honey in water or another fluid).  You can also use guaifenesin  and dextromethorphan for cough. You can use a humidifier for chest congestion and cough.  If you don't have a humidifier, you can sit in the bathroom with the hot shower running.      For sore throat: try warm salt water gargles, cepacol lozenges, throat spray, warm tea or water with lemon/honey, popsicles or ice, or OTC cold relief medicine for throat discomfort.   For congestion: take a daily anti-histamine like Zyrtec , Claritin, and a oral decongestant, such as pseudoephedrine.  You can also use Flonase  1-2 sprays in each nostril daily.   It is important to stay hydrated: drink plenty of fluids (water, gatorade/powerade/pedialyte, juices, or teas) to keep your throat moisturized and help further relieve irritation/discomfort.

## 2023-12-18 ENCOUNTER — Ambulatory Visit: Payer: Medicare Other | Admitting: Internal Medicine

## 2023-12-18 ENCOUNTER — Encounter: Payer: Self-pay | Admitting: Internal Medicine

## 2023-12-18 DIAGNOSIS — R059 Cough, unspecified: Secondary | ICD-10-CM | POA: Insufficient documentation

## 2023-12-18 NOTE — Progress Notes (Signed)
 Patient ID: Donald [name], male   DOB: [birth year], 89 y.o.   MRN: [id number] Did not show for appt

## 2024-04-15 ENCOUNTER — Observation Stay
Admission: EM | Admit: 2024-04-15 | Discharge: 2024-04-16 | Disposition: A | Discharge status: Home / Self Care | Attending: Internal Medicine | Admitting: Internal Medicine

## 2024-04-15 ENCOUNTER — Emergency Department

## 2024-04-15 DIAGNOSIS — N1832 Chronic kidney disease, stage 3b: Secondary | ICD-10-CM | POA: Insufficient documentation

## 2024-04-15 DIAGNOSIS — J9601 Acute respiratory failure with hypoxia: Secondary | ICD-10-CM | POA: Diagnosis not present

## 2024-04-15 DIAGNOSIS — I130 Hypertensive heart and chronic kidney disease with heart failure and stage 1 through stage 4 chronic kidney disease, or unspecified chronic kidney disease: Secondary | ICD-10-CM | POA: Diagnosis present

## 2024-04-15 DIAGNOSIS — I129 Hypertensive chronic kidney disease with stage 1 through stage 4 chronic kidney disease, or unspecified chronic kidney disease: Secondary | ICD-10-CM | POA: Insufficient documentation

## 2024-04-15 DIAGNOSIS — I5033 Acute on chronic diastolic (congestive) heart failure: Principal | ICD-10-CM | POA: Insufficient documentation

## 2024-04-15 DIAGNOSIS — Z856 Personal history of leukemia: Secondary | ICD-10-CM | POA: Diagnosis not present

## 2024-04-15 DIAGNOSIS — I482 Chronic atrial fibrillation, unspecified: Secondary | ICD-10-CM | POA: Diagnosis not present

## 2024-04-15 DIAGNOSIS — Z79899 Other long term (current) drug therapy: Secondary | ICD-10-CM | POA: Insufficient documentation

## 2024-04-15 DIAGNOSIS — I2510 Atherosclerotic heart disease of native coronary artery without angina pectoris: Secondary | ICD-10-CM | POA: Diagnosis not present

## 2024-04-15 DIAGNOSIS — J810 Acute pulmonary edema: Secondary | ICD-10-CM | POA: Diagnosis not present

## 2024-04-15 DIAGNOSIS — Z1152 Encounter for screening for COVID-19: Secondary | ICD-10-CM | POA: Diagnosis not present

## 2024-04-15 DIAGNOSIS — Z85118 Personal history of other malignant neoplasm of bronchus and lung: Secondary | ICD-10-CM | POA: Insufficient documentation

## 2024-04-15 DIAGNOSIS — J449 Chronic obstructive pulmonary disease, unspecified: Secondary | ICD-10-CM | POA: Diagnosis not present

## 2024-04-15 DIAGNOSIS — N400 Enlarged prostate without lower urinary tract symptoms: Secondary | ICD-10-CM | POA: Diagnosis not present

## 2024-04-15 DIAGNOSIS — Z7901 Long term (current) use of anticoagulants: Secondary | ICD-10-CM | POA: Insufficient documentation

## 2024-04-15 DIAGNOSIS — Z87891 Personal history of nicotine dependence: Secondary | ICD-10-CM | POA: Insufficient documentation

## 2024-04-15 LAB — TROPONIN I (HIGH SENSITIVITY)
Troponin I (High Sensitivity): 15 ng/L (ref <18)
Troponin I (High Sensitivity): 15 ng/L (ref <18)

## 2024-04-15 LAB — CBC WITH DIFFERENTIAL/PLATELET
Abs Immature Granulocytes: 0.04 10*3/uL (ref 0.00–0.07)
Basophils Absolute: 0.1 10*3/uL (ref 0.0–0.1)
Basophils Relative: 1 %
Eosinophils Absolute: 0.3 10*3/uL (ref 0.0–0.5)
Eosinophils Relative: 2 %
HCT: 31.7 % — ABNORMAL LOW (ref 39.0–52.0)
Hemoglobin: 9.9 g/dL — ABNORMAL LOW (ref 13.0–17.0)
Immature Granulocytes: 0 %
Lymphocytes Relative: 43 %
Lymphs Abs: 5 10*3/uL — ABNORMAL HIGH (ref 0.7–4.0)
MCH: 27.9 pg (ref 26.0–34.0)
MCHC: 31.2 g/dL (ref 30.0–36.0)
MCV: 89.3 fL (ref 80.0–100.0)
Monocytes Absolute: 1 10*3/uL (ref 0.1–1.0)
Monocytes Relative: 9 %
Neutro Abs: 5.3 10*3/uL (ref 1.7–7.7)
Neutrophils Relative %: 45 %
Platelets: 195 10*3/uL (ref 150–400)
RBC: 3.55 MIL/uL — ABNORMAL LOW (ref 4.22–5.81)
RDW: 16.4 % — ABNORMAL HIGH (ref 11.5–15.5)
Smear Review: NORMAL
WBC Morphology: >10
WBC: 11.7 10*3/uL — ABNORMAL HIGH (ref 4.0–10.5)
nRBC: 0 % (ref 0.0–0.2)

## 2024-04-15 LAB — COMPREHENSIVE METABOLIC PANEL WITH GFR
ALT: 14 U/L (ref 0–44)
AST: 19 U/L (ref 15–41)
Albumin: 3.6 g/dL (ref 3.5–5.0)
Alkaline Phosphatase: 91 U/L (ref 38–126)
Anion gap: 9 (ref 5–15)
BUN: 16 mg/dL (ref 8–23)
CO2: 25 mmol/L (ref 22–32)
Calcium: 9.1 mg/dL (ref 8.9–10.3)
Chloride: 108 mmol/L (ref 98–111)
Creatinine, Ser: 1.56 mg/dL — ABNORMAL HIGH (ref 0.61–1.24)
GFR, Estimated: 42 mL/min — ABNORMAL LOW (ref >60)
Glucose, Bld: 108 mg/dL — ABNORMAL HIGH (ref 70–99)
Potassium: 3.6 mmol/L (ref 3.5–5.1)
Sodium: 142 mmol/L (ref 135–145)
Total Bilirubin: 0.9 mg/dL (ref 0.0–1.2)
Total Protein: 6.6 g/dL (ref 6.5–8.1)

## 2024-04-15 LAB — BLOOD GAS, VENOUS
Acid-Base Excess: 0.1 mmol/L (ref 0.0–2.0)
Bicarbonate: 26 mmol/L (ref 20.0–28.0)
O2 Saturation: 53.1 %
Patient temperature: 37
pCO2, Ven: 46 mmHg (ref 44–60)
pH, Ven: 7.36 (ref 7.25–7.43)
pO2, Ven: 36 mmHg (ref 32–45)

## 2024-04-15 LAB — BRAIN NATRIURETIC PEPTIDE: B Natriuretic Peptide: 698.9 pg/mL — ABNORMAL HIGH (ref 0.0–100.0)

## 2024-04-15 MED ORDER — IPRATROPIUM-ALBUTEROL 0.5-2.5 (3) MG/3ML IN SOLN
6.0000 mL | Freq: Once | RESPIRATORY_TRACT | Status: AC
  Administered 2024-04-15: 6 mL via RESPIRATORY_TRACT
  Filled 2024-04-15: qty 3

## 2024-04-15 MED ORDER — FUROSEMIDE 10 MG/ML IJ SOLN
40.0000 mg | Freq: Two times a day (BID) | INTRAMUSCULAR | Status: DC
  Administered 2024-04-15 (×2): 40 mg via INTRAVENOUS
  Filled 2024-04-15 (×2): qty 4

## 2024-04-15 MED ORDER — IPRATROPIUM-ALBUTEROL 0.5-2.5 (3) MG/3ML IN SOLN: 3.0000 mL | RESPIRATORY_TRACT | Status: DC | PRN

## 2024-04-15 MED ORDER — NITROGLYCERIN 2 % TD OINT
1.0000 [in_us] | TOPICAL_OINTMENT | Freq: Once | TRANSDERMAL | Status: AC
  Administered 2024-04-15: 1 [in_us] via TOPICAL
  Filled 2024-04-15: qty 1

## 2024-04-15 MED ORDER — METOPROLOL TARTRATE 25 MG PO TABS
25.0000 mg | ORAL_TABLET | Freq: Two times a day (BID) | ORAL | Status: DC
  Administered 2024-04-15 – 2024-04-16 (×3): 25 mg via ORAL
  Filled 2024-04-15 (×3): qty 1

## 2024-04-15 MED ORDER — ATORVASTATIN CALCIUM 20 MG PO TABS
40.0000 mg | ORAL_TABLET | Freq: Every day | ORAL | Status: DC
  Administered 2024-04-15: 40 mg via ORAL
  Filled 2024-04-15: qty 2

## 2024-04-15 MED ORDER — ONDANSETRON HCL 4 MG PO TABS
4.0000 mg | ORAL_TABLET | Freq: Four times a day (QID) | ORAL | Status: DC | PRN
Start: 2024-04-15 — End: 2024-04-16

## 2024-04-15 MED ORDER — GUAIFENESIN 100 MG/5ML PO LIQD
400.0000 mg | Freq: Two times a day (BID) | ORAL | Status: DC | PRN
  Administered 2024-04-15: 400 mg via ORAL
  Filled 2024-04-15 (×2): qty 10

## 2024-04-15 MED ORDER — ADULT MULTIVITAMIN W/MINERALS CH
1.0000 | ORAL_TABLET | Freq: Every day | ORAL | Status: DC
  Administered 2024-04-15 – 2024-04-16 (×2): 1 via ORAL
  Filled 2024-04-15 (×3): qty 1

## 2024-04-15 MED ORDER — APIXABAN 2.5 MG PO TABS
2.5000 mg | ORAL_TABLET | Freq: Two times a day (BID) | ORAL | Status: DC
  Administered 2024-04-15 – 2024-04-16 (×3): 2.5 mg via ORAL
  Filled 2024-04-15 (×3): qty 1

## 2024-04-15 MED ORDER — ACETAMINOPHEN 325 MG PO TABS
650.0000 mg | ORAL_TABLET | Freq: Four times a day (QID) | ORAL | Status: DC | PRN
Start: 2024-04-15 — End: 2024-04-16

## 2024-04-15 MED ORDER — ONDANSETRON HCL 4 MG/2ML IJ SOLN: 4.0000 mg | Freq: Four times a day (QID) | INTRAMUSCULAR | Status: DC | PRN

## 2024-04-15 MED ORDER — ORAL CARE MOUTH RINSE: 15.0000 mL | OROMUCOSAL | Status: DC | PRN

## 2024-04-15 MED ORDER — TAMSULOSIN HCL 0.4 MG PO CAPS
0.4000 mg | ORAL_CAPSULE | Freq: Every evening | ORAL | Status: DC
  Administered 2024-04-15: 0.4 mg via ORAL
  Filled 2024-04-15: qty 1

## 2024-04-15 MED ORDER — ACETAMINOPHEN 650 MG RE SUPP: 650.0000 mg | Freq: Four times a day (QID) | RECTAL | Status: DC | PRN

## 2024-04-15 MED ORDER — FUROSEMIDE 10 MG/ML IJ SOLN
80.0000 mg | Freq: Once | INTRAMUSCULAR | Status: AC
  Administered 2024-04-15: 80 mg via INTRAVENOUS
  Filled 2024-04-15: qty 8

## 2024-04-15 MED ORDER — AMLODIPINE BESYLATE 10 MG PO TABS
10.0000 mg | ORAL_TABLET | Freq: Every day | ORAL | Status: DC
  Administered 2024-04-15 – 2024-04-16 (×2): 10 mg via ORAL
  Filled 2024-04-15 (×2): qty 2

## 2024-04-15 NOTE — Consult Note (Addendum)
 [facility] CARDIOLOGY CONSULT NOTE       Patient ID: Donald [name] MRN: [id number] DOB/AGE: Feb 12, 1933 89 y.o.  Admit date: 04/15/2024 Referring Physician Dr. Brenna Cam Primary Physician Estil Heman, NP Primary Cardiologist Dr. Adalberto Hollow (2022) Reason for Consultation heart failure exacerbation, hypertension  HPI: Donald [name] is a 89 y.o. male  with a past medical history of chronic HFpEF, coronary artery disease, hypertension, hyperlipidemia, atrial flutter (on Eliquis ), CKD 3b, COPD (previously on home O2), lung cancer s/p radiation PVD who presented to the ED on 04/15/2024 for shortness of breath and lower extremity edema. Cardiology was consulted for further evaluation.   Patient presented to the ED with shortness of breath and lower extremity edema with elevated blood pressures. Work up in the ED notable for sodium 142, potassium 3.6, creatinine 1.56 (near baseline), hemoglobin 11.7, platelets 195.  CXR with small left pleural effusion and pulmonary vascular congestion.  BNP elevated at 619.  Troponins negative x 2.  EKG in ED with atrial flutter, RBBB with rate 70 bpm. Upon arrival to ED patient placed on BiPAP for increased work of breathing. Patient received IV Lasix  80 mg and 40 mg, duoneb treatment in ED.  At the time of my evaluation this afternoon, patient was resting comfortably at bedside chair.  Discussed patient's in further detail.  Patient states he has been having worsening shortness of breath and elevated blood pressure. Patient states his wife checks his BP at home regularly however unable to states what his BP have been at home. Patient states his SOB has improved since admission and has weaned off BiPAP to room air with stable SpO2. Patient reports good UOP with IV lasix . Patient denies chest pain/pressure, palpitations or dizziness.   Review of systems complete and found to be negative unless listed above    Past Medical History:  Diagnosis Date    Anemia    Arthritis    BPH (benign prostatic hyperplasia)    Cancer (HCC)    Lung   COPD (chronic obstructive pulmonary disease) (HCC)    Emphysema of lung (HCC)    Emphysema of lung (HCC)    Esophageal reflux    Hyperlipidemia    Hypertension    Internal carotid artery stenosis, right    Neuropathy of both feet    Pneumonia    recurrent   PTSD (post-traumatic stress disorder)    [place] vet   Sebaceous cyst    SOB (shortness of breath) on exertion    TIA (transient ischemic attack)    Urge incontinence     Past Surgical History:  Procedure Laterality Date   CYST EXCISION     buttocks   ELECTROMAGNETIC NAVIGATION BROCHOSCOPY Left 08/23/2019   Procedure: ELECTROMAGNETIC NAVIGATION BRONCHOSCOPY;  Surgeon: Marc Senior, MD;  Location: [facility] ORS;  Service: Cardiopulmonary;  Laterality: Left;   ENDOBRONCHIAL ULTRASOUND N/A 08/23/2019   Procedure: ENDOBRONCHIAL ULTRASOUND;  Surgeon: Marc Senior, MD;  Location: [facility] ORS;  Service: Cardiopulmonary;  Laterality: N/A;   HEMORRHOID SURGERY     shave biopsy  03/13/2020   left neck 03/13/20 [facility] Alic[facility] Inoue wart with atypica inflamed +AK no carcinoma    SINUSOTOMY     VIDEO BRONCHOSCOPY WITH ENDOBRONCHIAL NAVIGATION Left 06/26/2020   Procedure: VIDEO BRONCHOSCOPY WITH ENDOBRONCHIAL NAVIGATION;  Surgeon: Marc Senior, MD;  Location: [facility] OR[facility];  Service: Pulmonary;  Laterality: Left;    (Not in a hospital admission)  Social History   Socioeconomic History   Marital  status: Married    Spouse name: Donald [name]   Number of children: 2   Years of education: Not on file   Highest education level: Some college, no degree  Occupational History   Not on file  Tobacco Use   Smoking status: Former    Current packs/day: 0.00    Average packs/day: 1.5 packs/day for 50.0 years (75.0 ttl pk-yrs)    Types: Cigarettes    Start date: 09/09/1949    Quit date: 09/10/1999    Years since quitting: 24.6   Smokeless tobacco:  Never  Vaping Use   Vaping status: Never Used  Substance and Sexual Activity   Alcohol use: No    Alcohol/week: 0.0 standard drinks of alcohol    Comment: not since 12/1980   Drug use: No   Sexual activity: Yes  Other Topics Concern   Not on file  Social History Narrative   Lives in [place] with wife. Dog and cat in home.      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  Recruitment consultant.      Diet - regular      Exercise - Walking   Social Drivers of Health   Financial Resource Strain: Low Risk  (09/24/2023)   Overall Financial Resource Strain (CARDIA)    Difficulty of Paying Living Expenses: Not hard at all  Food Insecurity: No Food Insecurity (10/21/2018)   Hunger Vital Sign    Worried About Running Out of Food in the Last Year: Never true    Ran Out of Food in the Last Year: Never true  Transportation Needs: No Transportation Needs (09/24/2023)   PRAPARE - Administrator, Civil Service (Medical): No    Lack of Transportation (Non-Medical): No  Physical Activity: Insufficiently Active (05/24/2020)   Exercise Vital Sign    Days of Exercise per Week: 5 days    Minutes of Exercise per Session: 20 min  Stress: No Stress Concern Present (05/24/2020)   [employer] of Occupational Health - Occupational Stress Questionnaire    Feeling of Stress : Not at all  Social Connections: Unknown (05/24/2020)   Social Connection and Isolation Panel [NHANES]    Frequency of Communication with Friends and Family: Not on file    Frequency of Social Gatherings with Friends and Family: Not on file    Attends Religious Services: Not on file    Active Member of Clubs or Organizations: Not on file    Attends Banker Meetings: Not on file    Marital Status: Married  Intimate Partner Violence: Not At Risk (05/24/2020)   Humiliation, Afraid, Rape, and Kick questionnaire    Fear of Current or Ex-Partner: No    Emotionally Abused: No    Physically Abused: No    Sexually  Abused: No    Family History  Problem Relation Age of Onset   Heart disease Mother    Cancer Maternal Aunt        lung   Lung cancer Maternal Aunt    Leukemia Maternal Aunt    Hypertension Son      Vitals:   04/15/24 0815 04/15/24 0830 04/15/24 0900 04/15/24 1300  BP: (!) 163/100 (!) 167/52 (!) 164/57 (!) 162/60  Pulse: 61 (!) 121 62 64  Resp: (!) 26 16 19 20   Temp: 98 F (36.7 C)   98.4 F (36.9 C)  TempSrc: Oral   Oral  SpO2: 100% 96% 100% 98%    PHYSICAL EXAM General: well appearing elderly  male, well nourished, in no acute distress. HEENT: Normocephalic and atraumatic. Neck: No JVD.   Lungs: Normal respiratory effort on room air. Clear bilaterally to auscultation. No wheezes, crackles, rhonchi.  Heart: HRRR. Normal S1 and S2 without gallops or murmurs.  Abdomen: Non-distended appearing.  Msk: Normal strength and tone for age. Extremities: Warm and well perfused. No clubbing, cyanosis. 2+ pitting edema bilaterally.  Neuro: Alert and oriented X 3. Psych: Answers questions appropriately.   Labs: Basic Metabolic Panel: Recent Labs    04/15/24 0351  NA 142  K 3.6  CL 108  CO2 25  GLUCOSE 108*  BUN 16  CREATININE 1.56*  CALCIUM  9.1   Liver Function Tests: Recent Labs    04/15/24 0351  AST 19  ALT 14  ALKPHOS 91  BILITOT 0.9  PROT 6.6  ALBUMIN 3.6   No results for input(s): "LIPASE", "AMYLASE" in the last 72 hours. CBC: Recent Labs    04/15/24 0351  WBC 11.7*  NEUTROABS 5.3  HGB 9.9*  HCT 31.7*  MCV 89.3  PLT 195   Cardiac Enzymes: Recent Labs    04/15/24 0351 04/15/24 0605  TROPONINIHS 15 15   BNP: Recent Labs    04/15/24 0351  BNP 698.9*   D-Dimer: No results for input(s): "DDIMER" in the last 72 hours. Hemoglobin A1C: No results for input(s): "HGBA1C" in the last 72 hours. Fasting Lipid Panel: No results for input(s): "CHOL", "HDL", "LDLCALC", "TRIG", "CHOLHDL", "LDLDIRECT" in the last 72 hours. Thyroid  Function Tests: No  results for input(s): "TSH", "T4TOTAL", "T3FREE", "THYROIDAB" in the last 72 hours.  Invalid input(s): "FREET3" Anemia Panel: No results for input(s): "VITAMINB12", "FOLATE", "FERRITIN", "TIBC", "IRON", "RETICCTPCT" in the last 72 hours.   Radiology: DG Chest 1 View Result Date: 04/15/2024 CLINICAL DATA:  Chest pain dyspnea CHF EXAM: CHEST  1 VIEW COMPARISON:  09/22/2023 FINDINGS: The lungs are symmetrically well expanded. Cardiac size is stably enlarged. Perihilar interstitial pulmonary infiltrates slightly progressed in the interval since prior examination in keeping mild cardiogenic failure. Small left pleural effusion. No pneumothorax. Nodular opacity seen at the left apex corresponding to an area nodular scarring, better seen on CT examination of 04/15/2022. IMPRESSION: 1. Mild cardiogenic failure with small left pleural effusion. Electronically Signed   By: Worthy Heads M.D.   On: 04/15/2024 04:20    ECHO ordered  TELEMETRY reviewed by me 04/15/2024: atrial flutter, rate 60s  EKG reviewed by me: atrial flutter, RBBB, rate 70 bpm  Data reviewed by me 04/15/2024: last 24h vitals tele labs imaging I/O ED provider note, admission H&P.  Principal Problem:   Acute on chronic heart failure with preserved ejection fraction (HFpEF) (HCC)    ASSESSMENT AND PLAN:  Donald [name] is a 89 y.o. male  with a past medical history of chronic HFpEF, coronary artery disease, hypertension, hyperlipidemia, atrial flutter (on Eliquis ), CKD 3b, COPD (previously on home O2), lung cancer s/p radiation PVD who presented to the ED on 04/15/2024 for shortness of breath and lower extremity edema. Cardiology was consulted for further evaluation.   # Acute on chronic HFpEF # Hypertensive urgency # Acute respiratory distress # Coronary artery disease # Hypertension # Hyperlipidemia # Chronic atrial flutter Patient presents with shortness of breath, lower extremity edema and hypertensive without chest  pain/pressure.  Patient placed on BiPAP arrival in the ED due to increased work of breathing.  Patient weaned off with stable SpO2 on room air.  CXR with small left pleural effusion and pulmonary vascular  congestion.  BNP elevated at 619.  Troponins negative x 2.  EKG in ED with atrial flutter, RBBB with rate 70 bpm.  Patient received 1x IV lasix  80 mg and 40 mg in ED with no documented UOP. Patient reports good UOP with IV lasix .  -Echo ordered  -Continue IV lasix  40 mg twice daily.  Closely monitor renal function and UOP. -Continue metoprolol  tartrate 25 mg twice daily. -Consider initiating SGLT2 inhibitor for GDMT optimization if renal function allows prior to discharge -Continue home Eliquis  2.5 mg twice daily for stroke risk reduction. -Continue amlodipine  10 mg daily. -Continue atorvastatin  40 mg daily.   This patient's plan of care was discussed and created with Dr. Bob Burn and he is in agreement.  Signed: Creighton Doffing, PA-C  04/15/2024, 1:57 PM [facility] Cardiology

## 2024-04-15 NOTE — ED Triage Notes (Signed)
 Per EMS pt hypertensive, 190/88, AO up with assist, hx CHF and COPD, SHOB and labored upon ambulation to bed, AO

## 2024-04-15 NOTE — H&P (Signed)
 History and Physical    Patient: Donald [name] [name] DOB: 11/03/1933 DOA: 04/15/2024 DOS: the patient was seen and examined on 04/15/2024 PCP: Estil Heman, NP  Patient coming from: Home  Chief Complaint:  Chief Complaint  Patient presents with   Hypertension    HPI: Donald [name] is a 89 y.o. male with medical history significant for COPD, previously on home O2, CAD, HTN, A flutter on Eliquis , CLL, lung cancer s/p radiation, CKD lllb,, BPH being admitted for CHF exacerbation.  He was last hospitalized for the same in November 2024 and the acute exacerbation was attributed to hypertensive emergency requiring infusion.  On today's visit but breathing, dyspnea on exertion.  Denied chest pain, lower extremity pain or swelling.  BP with EMS was 190/88. ED course and data review: BP recorded as 168/145 on arrival and tachypneic to the mid 20s with O2 sat on room air however with labored breathing.  He was placed on BiPAP for work of breathing. Labs notable for the following: Normal VBG Troponin 15 and BNP 699 Labs notable for WBC 11,700 and hemoglobin, baseline 12 CMP notable for creatinine of 1.56 which appears to be baseline  EKG, personally viewed and interpreted showing a flutter at 70 with RBBB and T wave changes.   chest x-ray shows mild cardiogenic failure with small left pleural effusion to be baseline  Patient treated with Lasix  80 mg and Nitropaste applied.  Also given a DuoNeb  Hospitalist consulted for admission.     Review of Systems: As mentioned in the history of present illness. All other systems reviewed and are negative.  Past Medical History:  Diagnosis Date   Anemia    Arthritis    BPH (benign prostatic hyperplasia)    Cancer (HCC)    Lung   COPD (chronic obstructive pulmonary disease) (HCC)    Emphysema of lung (HCC)    Emphysema of lung (HCC)    Esophageal reflux    Hyperlipidemia    Hypertension    Internal carotid artery stenosis,  right    Neuropathy of both feet    Pneumonia    recurrent   PTSD (post-traumatic stress disorder)    [place] vet   Sebaceous cyst    SOB (shortness of breath) on exertion    TIA (transient ischemic attack)    Urge incontinence    Past Surgical History:  Procedure Laterality Date   CYST EXCISION     buttocks   ELECTROMAGNETIC NAVIGATION BROCHOSCOPY Left 08/23/2019   Procedure: ELECTROMAGNETIC NAVIGATION BRONCHOSCOPY;  Surgeon: Marc Senior, MD;  Location: [facility] ORS;  Service: Cardiopulmonary;  Laterality: Left;   ENDOBRONCHIAL ULTRASOUND N/A 08/23/2019   Procedure: ENDOBRONCHIAL ULTRASOUND;  Surgeon: Marc Senior, MD;  Location: [facility];  Service: Cardiopulmonary;  Laterality: N/A;   HEMORRHOID SURGERY     shave biopsy  03/13/2020   left neck 03/13/20 [place]ou[facility] wart with atypica inflamed +AK no carcinoma    SINUSOTOMY     VIDEO BRONCHOSCOPY WITH ENDOBRONCHIAL NAVIGATION Left 06/26/2020   Procedure: VIDEO BRONCHOSCOPY WITH ENDOBRONCHIAL NAVIGATION;  Surgeon: Marc Senior, MD;  Location: [facility];  Service: Pulmonary;  Laterality: Left;   Social History:  reports that he quit smoking about 24 years ago. His smoking use included cigarettes. He started smoking about 74 years ago. He has a 75 pack-year smoking history. He has never used smokeless tobacco. He reports that he does not drink alcohol and does not use drugs.  Allergies  Allergen Reactions   Lovastatin Other (See Comments)   Cephalexin Rash and Hives    Family History  Problem Relation Age of Onset   Heart disease Mother    Cancer Maternal Aunt        lung   Lung cancer Maternal Aunt    Leukemia Maternal Aunt    Hypertension Son     Prior to Admission medications   Medication Sig Start Date End Date Taking? Authorizing Provider  acetaminophen  (TYLENOL ) 325 MG tablet Take 2 tablets (650 mg total) by mouth every 6 (six) hours as needed. 09/26/23   Patel, Sona, MD  albuterol  (VENTOLIN  HFA)  108 [phone] Base) MCG/ACT inhaler Inhale 2 puffs into the lungs every 6 (six) hours as needed for wheezing or shortness of breath. 08/12/19   Marc Senior, MD  amLODipine  (NORVASC ) 10 MG tablet Take 1 tablet (10 mg total) by mouth daily. 09/27/23   Patel, Sona, MD  apixaban  (ELIQUIS ) 2.5 MG TABS tablet Take 1 tablet (2.5 mg total) by mouth 2 (two) times daily. 05/31/21   McLean-Scocuzza, Karon Packer, MD  atorvastatin  (LIPITOR ) 80 MG tablet Take 40 mg by mouth at bedtime. 03/25/23   [provider]  DULoxetine  (CYMBALTA ) 30 MG capsule Take 30 mg by mouth 2 (two) times daily.    [provider]  famotidine  (PEPCID ) 20 MG tablet Take 20 mg by mouth daily as needed for indigestion or heartburn. 03/25/23   [provider]  finasteride  (PROSCAR ) 5 MG tablet Take 5 mg by mouth daily.    [provider]  furosemide  (LASIX ) 20 MG tablet Take 1 tablet (20 mg total) by mouth daily as needed. For SHortness of breath or leg edema 09/26/23   Melvinia Stager, MD  guaifenesin  (HUMIBID E) 400 MG TABS tablet Take 400 mg by mouth 2 (two) times daily as needed (cough). 08/28/23   [provider]  ipratropium (ATROVENT ) 0.03 % nasal spray Place 2 sprays into both nostrils daily. 12/24/22   [provider]  metoprolol  tartrate (LOPRESSOR ) 25 MG tablet Take 25 mg by mouth 2 (two) times daily. 03/25/23   [provider]  Multiple Vitamins-Minerals (MEGA MULTIVITAMIN FOR MEN) TABS Take 1 tablet by mouth daily.    [provider]  predniSONE  (STERAPRED UNI-PAK 21 TAB) 10 MG (21) TBPK tablet Take by mouth daily. Take 6 tabs by mouth daily  for 1 days, then 5 tabs for 1 days, then 4 tabs for 1 days, then 3 tabs for 1 days, 2 tabs for 1 days, then 1 tab by mouth daily for 1 days 12/17/23   Alayne Allis R, NP  senna-docusate (SENOKOT-S) 8.6-50 MG tablet Take 1 tablet by mouth 2 (two) times daily between meals as needed for mild constipation. 04/25/22   Gonfa, Taye T, MD   tamsulosin  (FLOMAX ) 0.4 MG CAPS capsule Take 0.4 mg by mouth every evening. 03/25/23   [provider]  Tiotropium Bromide -Olodaterol 2.5-2.5 MCG/ACT AERS Inhale 2 puffs into the lungs daily. 02/28/20   McLean-Scocuzza, Karon Packer, MD    Physical Exam: Vitals:   04/15/24 0341 04/15/24 0400 04/15/24 0401 04/15/24 0402  BP: (!) 168/145 137/72    Pulse: 72 (!) 59 66 73  Resp: (!) 25 20 (!) 26 (!) 22  Temp:   98.3 F (36.8 C)   TempSrc:   Oral   SpO2: 94% 95% 94% 95%   Physical Exam Vitals and nursing note reviewed.  Constitutional:      General: He  is not in acute distress.    Comments: Patient sitting bed elevated with BiPAP on.  Able to converse through mask in short sentences  HENT:     Head: Normocephalic and atraumatic.  Cardiovascular:     Rate and Rhythm: Normal rate and regular rhythm.     Heart sounds: Normal heart sounds.  Pulmonary:     Effort: Tachypnea present.     Breath sounds: Rales present.  Abdominal:     Palpations: Abdomen is soft.     Tenderness: There is no abdominal tenderness.  Musculoskeletal:     Right lower leg: Edema present.     Left lower leg: Edema present.  Neurological:     Mental Status: Mental status is at baseline.     Labs on Admission: I have personally reviewed following labs and imaging studies  CBC: Recent Labs  Lab 04/15/24 0351  WBC 11.7*  NEUTROABS PENDING  HGB 9.9*  HCT 31.7*  MCV 89.3  PLT 195   Basic Metabolic Panel: Recent Labs  Lab 04/15/24 0351  NA 142  K 3.6  CL 108  CO2 25  GLUCOSE 108*  BUN 16  CREATININE 1.56*  CALCIUM  9.1   GFR: CrCl cannot be calculated (Unknown ideal weight.). Liver Function Tests: Recent Labs  Lab 04/15/24 0351  AST 19  ALT 14  ALKPHOS 91  BILITOT 0.9  PROT 6.6  ALBUMIN 3.6   No results for input(s): "LIPASE", "AMYLASE" in the last 168 hours. No results for input(s): "AMMONIA" in the last 168 hours. Coagulation Profile: No results for input(s): "INR",  "PROTIME" in the last 168 hours. Cardiac Enzymes: No results for input(s): "CKTOTAL", "CKMB", "CKMBINDEX", "TROPONINI" in the last 168 hours. BNP (last 3 results) No results for input(s): "PROBNP" in the last 8760 hours. HbA1C: No results for input(s): "HGBA1C" in the last 72 hours. CBG: No results for input(s): "GLUCAP" in the last 168 hours. Lipid Profile: No results for input(s): "CHOL", "HDL", "LDLCALC", "TRIG", "CHOLHDL", "LDLDIRECT" in the last 72 hours. Thyroid  Function Tests: No results for input(s): "TSH", "T4TOTAL", "FREET4", "T3FREE", "THYROIDAB" in the last 72 hours. Anemia Panel: No results for input(s): "VITAMINB12", "FOLATE", "FERRITIN", "TIBC", "IRON", "RETICCTPCT" in the last 72 hours. Urine analysis:    Component Value Date/Time   COLORURINE YELLOW 04/16/2022 0030   APPEARANCEUR HAZY (A) 04/16/2022 0030   APPEARANCEUR Clear 06/22/2020 1008   LABSPEC >1.046 (H) 04/16/2022 0030   PHURINE 5.0 04/16/2022 0030   GLUCOSEU NEGATIVE 04/16/2022 0030   HGBUR SMALL (A) 04/16/2022 0030   BILIRUBINUR NEGATIVE 04/16/2022 0030   BILIRUBINUR Negative 06/22/2020 1008   KETONESUR NEGATIVE 04/16/2022 0030   PROTEINUR 100 (A) 04/16/2022 0030   UROBILINOGEN 0.2 05/05/2013 1134   NITRITE NEGATIVE 04/16/2022 0030   LEUKOCYTESUR NEGATIVE 04/16/2022 0030    Radiological Exams on Admission: DG Chest 1 View Result Date: 04/15/2024 CLINICAL DATA:  Chest pain dyspnea CHF EXAM: CHEST  1 VIEW COMPARISON:  09/22/2023 FINDINGS: The lungs are symmetrically well expanded. Cardiac size is stably enlarged. Perihilar interstitial pulmonary infiltrates slightly progressed in the interval since prior examination in keeping mild cardiogenic failure. Small left pleural effusion. No pneumothorax. Nodular opacity seen at the left apex corresponding to an area nodular scarring, better seen on CT examination of 04/15/2022. IMPRESSION: 1. Mild cardiogenic failure with small left pleural effusion.  Electronically Signed   By: Worthy Heads M.D.   On: 04/15/2024 04:20   Data Reviewed for HPI: Relevant notes from primary care and specialist visits,  past discharge summaries as available in EHR, including Care Everywhere. Prior diagnostic testing as pertinent to current admission diagnoses Updated medications and problem lists for reconciliation ED course, including vitals, labs, imaging, treatment and response to treatment Triage notes, nursing and pharmacy notes and ED provider's notes Notable results as noted above in HPI      Assessment and Plan:   Acute on chronic HFpEF hypertensive emergency Acute respiratory distress Patient presents with breathing, BNP 700, chest x-ray consistent with CHF IV Lasix  and nitropaste Continue home antihypertensives including metoprolol , lisinopril , hydralazine , carvedilol  and amlodipine   Echocardiogram (09/2023, G2 DD, EF 65-70) Daily weights with intake and output monitoring Continue BiPAP for comfort and wean as tolerated   Coronary artery disease involving native coronary artery of native heart without angina pectoris Denies chest pain and EKG nonacute.  First troponin of 15 Continue metoprolol , atorvastatin  and apixaban  Trend troponin   Atrial fibrillation, chronic (HCC) Chronic anticoagulation Continue Eliquis  and metoprolol   CKD lllb Renal function at baseline   COPD Not acutely exacerbated Continue home inhalers with DuoNebs as needed   CLL [facility] at the [facility]  BPH Continue home meds     DVT prophylaxis: Eliquis   Consults: none  Advance Care Planning:   Code Status: Prior   Family Communication: none  Disposition Plan: Back to previous home environment  Severity of Illness: The appropriate patient status for this patient is OBSERVATION. Observation status is judged to be reasonable and necessary in order to provide the required intensity of service to ensure the patient's safety. The patient's presenting  symptoms, physical exam findings, and initial radiographic and laboratory data in the context of their medical condition is felt to place them at decreased risk for further clinical deterioration. Furthermore, it is anticipated that the patient will be medically stable for discharge from the hospital within 2 midnights of admission.   Author: Lanetta Pion, MD 04/15/2024 4:56 AM  For on call review [email or web address].

## 2024-04-15 NOTE — ED Provider Notes (Addendum)
 [facility] Provider Note    Event Date/Time   First MD Initiated Contact with Patient 04/15/24 [phone]     (approximate)   History   Hypertension   HPI  Donald [name] is a 89 y.o. male   Past medical history of CHF, A-flutter on Eliquis , COPD, lung cancer on radiation but not chemotherapy, here with chest discomfort and shortness of breath he notes that it has been ongoing for 2 years and slightly worsening today and he reported high blood pressure despite taking his medications.  He has noticed worsening bilateral lower extremity edema.  He denies increasing cough or sputum production.   External Medical Documents Reviewed: Discharge summary from an admission in November 2024 when he was diagnosed with acute on chronic diastolic CHF exacerbation with elevated troponin suspected demand ischemia      Physical Exam   Triage Vital Signs: ED Triage Vitals  Encounter Vitals Group     BP      Systolic BP Percentile      Diastolic BP Percentile      Pulse      Resp      Temp      Temp src      SpO2      Weight      Height      Head Circumference      Peak Flow      Pain Score      Pain Loc      Pain Education      Exclude from Growth Chart     Most recent vital signs: Vitals:   04/15/24 0401 04/15/24 0402  BP:    Pulse: 66 73  Resp: (!) 26 (!) 22  Temp: 98.3 F (36.8 C)   SpO2: 94% 95%    General: Awake, no distress.  CV:  Good peripheral perfusion.  Resp:  Tachypneic, rales, no wheezing or focality, bilateral lower extremity pitting edema ankles and shins. Abd:  No distention.  Bedside ultrasound with B-lines bilaterally and poor ejection fraction.   ED Results / Procedures / Treatments   Labs (all labs ordered are listed, but only abnormal results are displayed) Labs Reviewed  BRAIN NATRIURETIC PEPTIDE - Abnormal; Notable for the following components:      Result Value   B Natriuretic Peptide 698.9 (*)    All other  components within normal limits  COMPREHENSIVE METABOLIC PANEL WITH GFR - Abnormal; Notable for the following components:   Glucose, Bld 108 (*)    Creatinine, Ser 1.56 (*)    GFR, Estimated 42 (*)    All other components within normal limits  CBC WITH DIFFERENTIAL/PLATELET - Abnormal; Notable for the following components:   WBC 11.7 (*)    RBC 3.55 (*)    Hemoglobin 9.9 (*)    HCT 31.7 (*)    RDW 16.4 (*)    All other components within normal limits  BLOOD GAS, VENOUS  TROPONIN I (HIGH SENSITIVITY)     I ordered and reviewed the above labs they are notable for BNP is 700 white blood cell count slightly elevated, blood gas within normal limit  EKG  ED ECG REPORT I, Buell Carmin, the attending physician, personally viewed and interpreted this ECG.   Date: 04/15/2024  EKG Time: 0342  Rate: 70  Rhythm: flutter  Axis: nl  Intervals:rbbb  ST&T Change: no stemi    RADIOLOGY I independently reviewed and interpreted chest x-ray and see pulmonary edema I  also reviewed radiologist's formal read.   PROCEDURES:  Critical Care performed: Yes, see critical care procedure note(s)  .Critical Care  Performed by: Buell Carmin, MD Authorized by: Buell Carmin, MD   Critical care provider statement:    Critical care time (minutes):  30   Critical care was time spent personally by me on the following activities:  Development of treatment plan with patient or surrogate, discussions with consultants, evaluation of patient's response to treatment, examination of patient, ordering and review of laboratory studies, ordering and review of radiographic studies, ordering and performing treatments and interventions, pulse oximetry, re-evaluation of patient's condition and review of old charts    MEDICATIONS ORDERED IN ED: Medications  nitroGLYCERIN  (NITROGLYN) 2 % ointment 1 inch (1 inch Topical Given 04/15/24 0359)  furosemide  (LASIX ) injection 80 mg (80 mg Intravenous Given 04/15/24 0359)   ipratropium-albuterol  (DUONEB) 0.5-2.5 (3) MG/3ML nebulizer solution 6 mL (6 mLs Nebulization Given 04/15/24 0430)    External physician / consultants:  I spoke with hospital medicine for admission and regarding care plan for this patient.   IMPRESSION / MDM / ASSESSMENT AND PLAN / ED COURSE  I reviewed the triage vital signs and the nursing notes.                                Patient's presentation is most consistent with acute presentation with potential threat to life or bodily function.  Differential diagnosis includes, but is not limited to, CHF exacerbation, COPD exacerbation, ACS, respiratory infection   The patient is on the cardiac monitor to evaluate for evidence of arrhythmia and/or significant heart rate changes.  MDM:    I think he has a CHF exacerbation history of diastolic failure and an admission for the same.  Evidenced by fluid overload on physical exam and bedside ultrasound with a BNP elevated in markings consistent with the same on chest x-ray.  Initially was just slightly tachypneic not in respiratory distress but after given Lasix , Nitropaste, his breathing got worse despite this treatment.  He was eager to go home to make his cancer appointment later this morning but I let him know that he should be admitted and with his worsening breathing, I have decided to put him on BiPAP.  I think this is more CHF rather than COPD given no increasing cough or sputum production no wheezing but given his worsening shortness of breath despite initial treatments for CHF, I think DuoNebs trial is prudent and we will do him little harm.  I doubt PE given his compliance with his Eliquis  and findings more consistent with CHF exacerbation to explain his chest discomfort and shortness of breath.  After BiPAP already starts to feel markedly better with the  shortness of breath.  Admission.       FINAL CLINICAL IMPRESSION(S) / ED DIAGNOSES   Final diagnoses:  Acute on chronic  diastolic congestive heart failure (HCC)     Rx / DC Orders   ED Discharge Orders     None        Note:  This document was prepared using Dragon voice recognition software and may include unintentional dictation errors.    Buell Carmin, MD 04/15/24 1610    Buell Carmin, MD 04/15/24 [phone]

## 2024-04-15 NOTE — Progress Notes (Signed)
 Heart Failure Navigator Progress Note  Assessed for Heart & Vascular TOC clinic readiness.  Does not meet criteria due to current [facility] patient.   Navigator will sign off at this time.  Roxy Horseman, RN, BSN [facility] Heart Failure Navigator Secure Chat Only

## 2024-04-15 NOTE — Final Progress Note (Signed)
 Same-day rounding progress note  Patient seen and examined while in the ED.  I agree with Dr. Harrel Lim assessment and plan.  Please see her dictated history and physical for further details.  Acute hypoxic respiratory failure Due to acute on chronic heart failure with preserved ejection fraction likely due to flash pulmonary edema from hypertensive urgency Wean off BiPAP Cardiology consult Aggressive diuresis with Lasix  40 mg IV twice daily  Goals of care Palliative care consult   Time spent: 35 minutes

## 2024-04-16 ENCOUNTER — Other Ambulatory Visit: Payer: Self-pay

## 2024-04-16 DIAGNOSIS — J810 Acute pulmonary edema: Secondary | ICD-10-CM

## 2024-04-16 DIAGNOSIS — J9601 Acute respiratory failure with hypoxia: Secondary | ICD-10-CM | POA: Diagnosis not present

## 2024-04-16 DIAGNOSIS — I5033 Acute on chronic diastolic (congestive) heart failure: Secondary | ICD-10-CM | POA: Diagnosis not present

## 2024-04-16 DIAGNOSIS — I160 Hypertensive urgency: Secondary | ICD-10-CM

## 2024-04-16 LAB — BASIC METABOLIC PANEL WITH GFR
Anion gap: 10 (ref 5–15)
BUN: 16 mg/dL (ref 8–23)
CO2: 27 mmol/L (ref 22–32)
Calcium: 8.9 mg/dL (ref 8.9–10.3)
Chloride: 105 mmol/L (ref 98–111)
Creatinine, Ser: 1.51 mg/dL — ABNORMAL HIGH (ref 0.61–1.24)
GFR, Estimated: 44 mL/min — ABNORMAL LOW (ref >60)
Glucose, Bld: 105 mg/dL — ABNORMAL HIGH (ref 70–99)
Potassium: 3.5 mmol/L (ref 3.5–5.1)
Sodium: 142 mmol/L (ref 135–145)

## 2024-04-16 LAB — CBC
HCT: 32.5 % — ABNORMAL LOW (ref 39.0–52.0)
Hemoglobin: 10.2 g/dL — ABNORMAL LOW (ref 13.0–17.0)
MCH: 27.8 pg (ref 26.0–34.0)
MCHC: 31.4 g/dL (ref 30.0–36.0)
MCV: 88.6 fL (ref 80.0–100.0)
Platelets: 208 10*3/uL (ref 150–400)
RBC: 3.67 MIL/uL — ABNORMAL LOW (ref 4.22–5.81)
RDW: 16.5 % — ABNORMAL HIGH (ref 11.5–15.5)
WBC: 11.1 10*3/uL — ABNORMAL HIGH (ref 4.0–10.5)
nRBC: 0 % (ref 0.0–0.2)

## 2024-04-16 MED ORDER — HYDRALAZINE HCL 25 MG PO TABS
25.0000 mg | ORAL_TABLET | Freq: Three times a day (TID) | ORAL | 0 refills | Status: AC
Start: 2024-04-16 — End: 2024-05-16
  Filled 2024-04-16: qty 90, 30d supply, fill #0

## 2024-04-16 MED ORDER — FUROSEMIDE 40 MG PO TABS
40.0000 mg | ORAL_TABLET | Freq: Every day | ORAL | 0 refills | Status: AC
  Filled 2024-04-16: qty 30, 30d supply, fill #0

## 2024-04-16 MED ORDER — FUROSEMIDE 40 MG PO TABS
40.0000 mg | ORAL_TABLET | Freq: Every day | ORAL | Status: DC
  Administered 2024-04-16: 40 mg via ORAL
  Filled 2024-04-16: qty 1

## 2024-04-16 MED ORDER — HYDRALAZINE HCL 25 MG PO TABS
25.0000 mg | ORAL_TABLET | Freq: Three times a day (TID) | ORAL | Status: DC
  Administered 2024-04-16: 25 mg via ORAL
  Filled 2024-04-16: qty 1

## 2024-04-16 NOTE — Progress Notes (Signed)
 [facility] CLINIC CARDIOLOGY PROGRESS NOTE       Patient ID: Donald [name] MRN: [id number] DOB/AGE: 1933/03/30 89 y.o.  Admit date: 04/15/2024 Referring Physician Dr. Brenna Cam Primary Physician Estil Heman, NP Primary Cardiologist Dr. Adalberto Hollow (2022) Reason for Consultation heart failure exacerbation, hypertension  HPI: Donald [name] is a 89 y.o. male  with a past medical history of chronic HFpEF, coronary artery disease, hypertension, hyperlipidemia, atrial flutter (on Eliquis ), CKD 3b, COPD (previously on home O2), lung cancer s/p radiation PVD who presented to the ED on 04/15/2024 for shortness of breath and lower extremity edema. Cardiology was consulted for further evaluation.   Interval History: -Patient seen and examined this AM and sitting up on site of hospital bed. Patient states he feels his SOB has improved. LEE is much improved this AM. Patient denies other cardiac sxs.  -Patients BP elevated and HR stable this this AM. Overnight Tele showed no significant events.  -No chart documented UOP. Patient reports he's had great UOP.  -Patient remains on room air with stable SpO2.  -Encouraged patient to ambulate.   Review of systems complete and found to be negative unless listed above    Past Medical History:  Diagnosis Date   Anemia    Arthritis    BPH (benign prostatic hyperplasia)    Cancer (HCC)    Lung   COPD (chronic obstructive pulmonary disease) (HCC)    Emphysema of lung (HCC)    Emphysema of lung (HCC)    Esophageal reflux    Hyperlipidemia    Hypertension    Internal carotid artery stenosis, right    Neuropathy of both feet    Pneumonia    recurrent   PTSD (post-traumatic stress disorder)    [place] vet   Sebaceous cyst    SOB (shortness of breath) on exertion    TIA (transient ischemic attack)    Urge incontinence     Past Surgical History:  Procedure Laterality Date   CYST EXCISION     buttocks   ELECTROMAGNETIC NAVIGATION  BROCHOSCOPY Left 08/23/2019   Procedure: ELECTROMAGNETIC NAVIGATION BRONCHOSCOPY;  Surgeon: Marc Senior, MD;  Location: [facility] ORS;  Service: Cardiopulmonary;  Laterality: Left;   ENDOBRONCHIAL ULTRASOUND N/A 08/23/2019   Procedure: ENDOBRONCHIAL ULTRASOUND;  Surgeon: Marc Senior, MD;  Location: [facility] ORS;  Service: Cardiopulmonary;  Laterality: N/A;   HEMORRHOID SURGERY     shave biopsy  03/13/2020   left neck 03/13/20 [facility] Alicia Inoue wart with atypica inflamed +AK no carcinoma    SINUSOTOMY     VIDEO BRONCHOSCOPY WITH ENDOBRONCHIAL NAVIGATION Left 06/26/2020   Procedure: VIDEO BRONCHOSCOPY WITH ENDOBRONCHIAL NAVIGATION;  Surgeon: Marc Senior, MD;  Location: [facility] ORS;  Service: Pulmonary;  Laterality: Left;    Medications Prior to Admission  Medication Sig Dispense Refill Last Dose/Taking   acetaminophen  (TYLENOL ) 325 MG tablet Take 2 tablets (650 mg total) by mouth every 6 (six) hours as needed. 10 tablet 0 Taking As Needed   albuterol  (VENTOLIN  HFA) 108 (90 Base) MCG/ACT inhaler Inhale 2 puffs into the lungs every 6 (six) hours as needed for wheezing or shortness of breath. 18 g 6 Taking As Needed   amLODipine  (NORVASC ) 10 MG tablet Take 1 tablet (10 mg total) by mouth daily. 30 tablet 1 Taking   apixaban  (ELIQUIS ) 2.5 MG TABS tablet Take 1 tablet (2.5 mg total) by mouth 2 (two) times daily.   04/14/2024   atorvastatin  (LIPITOR ) 80 MG tablet Take 40 mg  by mouth at bedtime.   Taking   buPROPion  (WELLBUTRIN  XL) 150 MG 24 hr tablet Take 150 mg by mouth daily.   Taking   DULoxetine  (CYMBALTA ) 30 MG capsule Take 30 mg by mouth 2 (two) times daily.   Taking   finasteride  (PROSCAR ) 5 MG tablet Take 5 mg by mouth daily.   Taking   furosemide  (LASIX ) 20 MG tablet Take 1 tablet (20 mg total) by mouth daily as needed. For SHortness of breath or leg edema 30 tablet 0 Taking As Needed   metoprolol  tartrate (LOPRESSOR ) 25 MG tablet Take 25 mg by mouth 2 (two) times daily.   Taking    Multiple Vitamins-Minerals (MEGA MULTIVITAMIN FOR MEN) TABS Take 1 tablet by mouth daily.   Taking   Tiotropium Bromide -Olodaterol 2.5-2.5 MCG/ACT AERS Inhale 2 puffs into the lungs daily.   Taking   valACYclovir  (VALTREX ) 500 MG tablet Take 500 mg by mouth every other day.   Taking   famotidine  (PEPCID ) 20 MG tablet Take 20 mg by mouth daily as needed for indigestion or heartburn.      guaifenesin  (HUMIBID E) 400 MG TABS tablet Take 400 mg by mouth 2 (two) times daily as needed (cough). (Patient not taking: Reported on 04/15/2024)   Not Taking   ipratropium (ATROVENT ) 0.03 % nasal spray Place 2 sprays into both nostrils daily.      predniSONE  (STERAPRED UNI-PAK 21 TAB) 10 MG (21) TBPK tablet Take by mouth daily. Take 6 tabs by mouth daily  for 1 days, then 5 tabs for 1 days, then 4 tabs for 1 days, then 3 tabs for 1 days, 2 tabs for 1 days, then 1 tab by mouth daily for 1 days (Patient not taking: Reported on 04/15/2024) 21 tablet 0 Not Taking   senna-docusate (SENOKOT-S) 8.6-50 MG tablet Take 1 tablet by mouth 2 (two) times daily between meals as needed for mild constipation. (Patient not taking: Reported on 04/15/2024) 60 tablet 0 Not Taking   tamsulosin  (FLOMAX ) 0.4 MG CAPS capsule Take 0.4 mg by mouth every evening.   Unknown   Social History   Socioeconomic History   Marital status: Married    Spouse name: Hadassah Letters   Number of children: 2   Years of education: Not on file   Highest education level: Some college, no degree  Occupational History   Not on file  Tobacco Use   Smoking status: Former    Current packs/day: 0.00    Average packs/day: 1.5 packs/day for 50.0 years (75.0 ttl pk-yrs)    Types: Cigarettes    Start date: 09/09/1949    Quit date: 09/10/1999    Years since quitting: 24.6   Smokeless tobacco: Never  Vaping Use   Vaping status: Never Used  Substance and Sexual Activity   Alcohol use: No    Alcohol/week: 0.0 standard drinks of alcohol    Comment: not since  12/1980   Drug use: No   Sexual activity: Yes  Other Topics Concern   Not on file  Social History Narrative   Lives in [place] with wife. Dog and cat in home.      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  Recruitment consultant.      Diet - regular      Exercise - Walking   Social Drivers of Health   Financial Resource Strain: Low Risk  (09/24/2023)   Overall Financial Resource Strain (CARDIA)    Difficulty of Paying Living Expenses: Not hard at all  Food Insecurity: No Food Insecurity (04/15/2024)   Hunger Vital Sign    Worried About Running Out of Food in the Last Year: Never true    Ran Out of Food in the Last Year: Never true  Transportation Needs: No Transportation Needs (04/15/2024)   PRAPARE - Administrator, Civil Service (Medical): No    Lack of Transportation (Non-Medical): No  Physical Activity: Insufficiently Active (05/24/2020)   Exercise Vital Sign    Days of Exercise per Week: 5 days    Minutes of Exercise per Session: 20 min  Stress: No Stress Concern Present (05/24/2020)   [employer] of Occupational Health - Occupational Stress Questionnaire    Feeling of Stress : Not at all  Social Connections: Socially Integrated (04/15/2024)   Social Connection and Isolation Panel [NHANES]    Frequency of Communication with Friends and Family: More than three times a week    Frequency of Social Gatherings with Friends and Family: More than three times a week    Attends Religious Services: 1 to 4 times per year    Active Member of Clubs or Organizations: No    Attends Banker Meetings: 1 to 4 times per year    Marital Status: Married  Catering manager Violence: Not At Risk (04/15/2024)   Humiliation, Afraid, Rape, and Kick questionnaire    Fear of Current or Ex-Partner: No    Emotionally Abused: No    Physically Abused: No    Sexually Abused: No    Family History  Problem Relation Age of Onset   Heart disease Mother    Cancer Maternal Aunt        lung    Lung cancer Maternal Aunt    Leukemia Maternal Aunt    Hypertension Son      Vitals:   04/15/24 2115 04/15/24 2314 04/16/24 0435 04/16/24 0500  BP: (!) 144/64 (!) 158/63 (!) 159/52   Pulse: 64 63 61   Resp: 18 18 18    Temp: 98 F (36.7 C) 97.6 F (36.4 C)    TempSrc: Oral     SpO2: 99% 95% 96%   Weight:    87.2 kg    PHYSICAL EXAM General: well appearing elderly male, well nourished, in no acute distress. HEENT: Normocephalic and atraumatic. Neck: No JVD.   Lungs: Normal respiratory effort on room air. Clear bilaterally to auscultation. No wheezes, crackles, rhonchi.  Heart: HRRR. Normal S1 and S2 without gallops or murmurs.  Abdomen: Non-distended appearing.  Msk: Normal strength and tone for age. Extremities: Warm and well perfused. No clubbing, cyanosis. Trace edema bilaterally.  Neuro: Alert and oriented X 3. Psych: Answers questions appropriately.   Labs: Basic Metabolic Panel: Recent Labs    04/15/24 0351 04/16/24 0554  NA 142 142  K 3.6 3.5  CL 108 105  CO2 25 27  GLUCOSE 108* 105*  BUN 16 16  CREATININE 1.56* 1.51*  CALCIUM  9.1 8.9   Liver Function Tests: Recent Labs    04/15/24 0351  AST 19  ALT 14  ALKPHOS 91  BILITOT 0.9  PROT 6.6  ALBUMIN 3.6   No results for input(s): "LIPASE", "AMYLASE" in the last 72 hours. CBC: Recent Labs    04/15/24 0351 04/16/24 0554  WBC 11.7* 11.1*  NEUTROABS 5.3  --   HGB 9.9* 10.2*  HCT 31.7* 32.5*  MCV 89.3 88.6  PLT 195 208   Cardiac Enzymes: Recent Labs    04/15/24 0351 04/15/24 0605  TROPONINIHS  15 15   BNP: Recent Labs    04/15/24 0351  BNP 698.9*   D-Dimer: No results for input(s): "DDIMER" in the last 72 hours. Hemoglobin A1C: No results for input(s): "HGBA1C" in the last 72 hours. Fasting Lipid Panel: No results for input(s): "CHOL", "HDL", "LDLCALC", "TRIG", "CHOLHDL", "LDLDIRECT" in the last 72 hours. Thyroid  Function Tests: No results for input(s): "TSH", "T4TOTAL", "T3FREE",  "THYROIDAB" in the last 72 hours.  Invalid input(s): "FREET3" Anemia Panel: No results for input(s): "VITAMINB12", "FOLATE", "FERRITIN", "TIBC", "IRON", "RETICCTPCT" in the last 72 hours.   Radiology: DG Chest 1 View Result Date: 04/15/2024 CLINICAL DATA:  Chest pain dyspnea CHF EXAM: CHEST  1 VIEW COMPARISON:  09/22/2023 FINDINGS: The lungs are symmetrically well expanded. Cardiac size is stably enlarged. Perihilar interstitial pulmonary infiltrates slightly progressed in the interval since prior examination in keeping mild cardiogenic failure. Small left pleural effusion. No pneumothorax. Nodular opacity seen at the left apex corresponding to an area nodular scarring, better seen on CT examination of 04/15/2022. IMPRESSION: 1. Mild cardiogenic failure with small left pleural effusion. Electronically Signed   By: Worthy Heads M.D.   On: 04/15/2024 04:20    ECHO 09/24/2023  1. Left ventricular ejection fraction, by estimation, is 65 to 70%. The left ventricle has normal function. The left ventricle has no regional wall motion abnormalities. Left ventricular diastolic parameters are consistent with Grade II diastolic dysfunction pseudonormalization).   2. Right ventricular systolic function is normal. The right ventricular size is normal. There is normal pulmonary artery systolic pressure.  3. The mitral valve is normal in structure. Trivial mitral valve regurgitation.   4. The aortic valve is normal in structure. Aortic valve regurgitation is not visualized.   TELEMETRY reviewed by me 04/16/2024: atrial flutter with occasional PVCs, rate 80s  EKG reviewed by me: atrial flutter, RBBB, rate 70 bpm  Data reviewed by me 04/16/2024: last 24h vitals tele labs imaging I/O hospitalist progress note.  Principal Problem:   Acute on chronic heart failure with preserved ejection fraction (HFpEF) (HCC)    ASSESSMENT AND PLAN:  Donald [name] is a 89 y.o. male  with a past medical history of chronic  HFpEF, coronary artery disease, hypertension, hyperlipidemia, atrial flutter (on Eliquis ), CKD 3b, COPD (previously on home O2), lung cancer s/p radiation PVD who presented to the ED on 04/15/2024 for shortness of breath and lower extremity edema. Cardiology was consulted for further evaluation.   # Acute on chronic HFpEF # Hypertensive urgency # Acute respiratory distress # Coronary artery disease # Hypertension # Hyperlipidemia # Chronic atrial flutter Patient presents with shortness of breath, lower extremity edema and hypertensive without chest pain/pressure. Echo from 09/2023 with pEF (65-70%), with no RWMA. Patient placed on BiPAP arrival in the ED due to increased work of breathing.  Patient weaned off with stable SpO2 on room air.  CXR with small left pleural effusion and pulmonary vascular congestion.  BNP elevated at 619.  Troponins negative x 2.  EKG in ED with atrial flutter, RBBB with rate 70 bpm.  Patient received 1x IV lasix  80 mg and 40 mg in ED with no documented UOP. Patient reports good UOP with IV lasix .  -Transitioned IV to PO lasix  40 mg daily.  Closely monitor renal function and UOP. -Continue metoprolol  tartrate 25 mg twice daily. -Consider initiating SGLT2 inhibitor for GDMT optimization at outpatient follow-up. -Continue home Eliquis  2.5 mg twice daily for stroke risk reduction. -Continue amlodipine  10 mg daily. -Ordered hydralazine   25 mg TID for better BP control.  -Per chart review, patient has hx of intolerance/renal dysfunction with ACEi/ARB. Will avoid these agents.  -Continue atorvastatin  40 mg daily. -Recommend ambulation prior to discharge.  Ok for discharge today from a cardiac perspective after patient ambulates without concern. Will arrange for follow up in clinic with Dr. Bob Burn in 1-2 weeks. Cardiology will sign off.    This patient's plan of care was discussed and created with Dr. Bob Burn and he is in agreement.  Signed: Creighton Doffing, PA-C   04/16/2024, 8:40 AM [facility] Cardiology

## 2024-04-16 NOTE — TOC Transition Note (Signed)
 Transition of Care [facility]) - Discharge Note   Patient Details  Name: Donald [name] MRN: [id number] Date of Birth: January 07, 1933  [facility]) CM/SW Contact:  Lamerle Jabs C Cloteal Isaacson, RN Phone Number: 04/16/2024, 10:20 AM   Clinical Narrative:    Spoke with patient regarding discharge plans. He is agreeable to palliative careand is requesting [facility]. Patient stated his wife and daughter will transport him home.   Referral for palliative made to [place] [facility].   TOC signing off.           Patient Goals and CMS Choice            Discharge Placement                       Discharge Plan and Services Additional resources added to the After Visit Summary for                                       Social Drivers of Health (SDOH) Interventions SDOH Screenings   Food Insecurity: No Food Insecurity (04/15/2024)  Housing: Low Risk  (04/15/2024)  Transportation Needs: No Transportation Needs (04/15/2024)  Utilities: Not At Risk (04/15/2024)  Alcohol Screen: Low Risk  (09/24/2023)  Depression (PHQ2-9): Low Risk  (05/31/2021)  Financial Resource Strain: Low Risk  (09/24/2023)  Physical Activity: Insufficiently Active (05/24/2020)  Social Connections: Socially Integrated (04/15/2024)  Stress: No Stress Concern Present (05/24/2020)  Tobacco Use: Medium Risk (04/15/2024)     Readmission Risk Interventions     No data to display

## 2024-04-16 NOTE — Care Management Obs Status (Signed)
 MEDICARE OBSERVATION STATUS NOTIFICATION   Patient Details  Name: Donald [name] MRN: [id number] Date of Birth: [birth year]   Medicare Observation Status Notification Given:  No (patient did not want a copy)    Anise Kerns 04/16/2024, 11:15 AM

## 2024-04-16 NOTE — Progress Notes (Signed)
 [facility] Liaison Note:   (new referral for outpatient palliative services)  Notified by [facility], Keona Alston, RN of patient/family request for [facility] Palliative Care services at home after discharge.  Referral submitted today.   Please call with any hospice or outpatient palliative care related questions.  Thank you for the opportunity to participate in this patient's care.  Helmut Lobe, [facility] Liaison [phone]

## 2024-04-16 NOTE — Plan of Care (Signed)
   Problem: Health Behavior/Discharge Planning: Goal: Ability to manage health-related needs will improve Outcome: Progressing   Problem: Clinical Measurements: Goal: Ability to maintain clinical measurements within normal limits will improve Outcome: Progressing   Problem: Clinical Measurements: Goal: Will remain free from infection Outcome: Progressing

## 2024-04-17 ENCOUNTER — Emergency Department
Admission: EM | Admit: 2024-04-17 | Discharge: 2024-04-17 | Disposition: A | Discharge status: Home / Self Care | Attending: Emergency Medicine | Admitting: Emergency Medicine

## 2024-04-17 ENCOUNTER — Other Ambulatory Visit: Payer: Self-pay

## 2024-04-17 DIAGNOSIS — E860 Dehydration: Secondary | ICD-10-CM | POA: Diagnosis not present

## 2024-04-17 DIAGNOSIS — R197 Diarrhea, unspecified: Secondary | ICD-10-CM

## 2024-04-17 DIAGNOSIS — J449 Chronic obstructive pulmonary disease, unspecified: Secondary | ICD-10-CM | POA: Insufficient documentation

## 2024-04-17 DIAGNOSIS — I509 Heart failure, unspecified: Secondary | ICD-10-CM | POA: Insufficient documentation

## 2024-04-17 LAB — CBC
HCT: 31.1 % — ABNORMAL LOW (ref 39.0–52.0)
Hemoglobin: 9.4 g/dL — ABNORMAL LOW (ref 13.0–17.0)
MCH: 27.1 pg (ref 26.0–34.0)
MCHC: 30.2 g/dL (ref 30.0–36.0)
MCV: 89.6 fL (ref 80.0–100.0)
Platelets: 197 10*3/uL (ref 150–400)
RBC: 3.47 MIL/uL — ABNORMAL LOW (ref 4.22–5.81)
RDW: 16.6 % — ABNORMAL HIGH (ref 11.5–15.5)
WBC: 10.2 10*3/uL (ref 4.0–10.5)
nRBC: 0 % (ref 0.0–0.2)

## 2024-04-17 LAB — TROPONIN I (HIGH SENSITIVITY): Troponin I (High Sensitivity): 14 ng/L (ref <18)

## 2024-04-17 LAB — BASIC METABOLIC PANEL WITH GFR
Anion gap: 9 (ref 5–15)
BUN: 22 mg/dL (ref 8–23)
CO2: 26 mmol/L (ref 22–32)
Calcium: 8.9 mg/dL (ref 8.9–10.3)
Chloride: 105 mmol/L (ref 98–111)
Creatinine, Ser: 1.96 mg/dL — ABNORMAL HIGH (ref 0.61–1.24)
GFR, Estimated: 32 mL/min — ABNORMAL LOW (ref >60)
Glucose, Bld: 104 mg/dL — ABNORMAL HIGH (ref 70–99)
Potassium: 3.8 mmol/L (ref 3.5–5.1)
Sodium: 140 mmol/L (ref 135–145)

## 2024-04-17 MED ORDER — LOPERAMIDE HCL 2 MG PO TABS: 2.0000 mg | ORAL_TABLET | Freq: Four times a day (QID) | ORAL | 0 refills | Status: DC | PRN

## 2024-04-17 MED ORDER — SODIUM CHLORIDE 0.9 % IV SOLN: Freq: Once | INTRAVENOUS | Status: DC

## 2024-04-17 MED ORDER — SODIUM CHLORIDE 0.9 % IV BOLUS
500.0000 mL | Freq: Once | INTRAVENOUS | Status: AC
  Administered 2024-04-17: 500 mL via INTRAVENOUS

## 2024-04-17 NOTE — ED Notes (Signed)
 Pt HR dropping into the 40's and returning to 60 within a minute. Dr Martina Sledge made aware

## 2024-04-17 NOTE — ED Provider Notes (Signed)
 [facility] Provider Note    Event Date/Time   First MD Initiated Contact with Patient 04/17/24 1410     (approximate)   History   Hypotension and Diarrhea   HPI  Donald [name] is a 89 y.o. male with a history of COPD, CHF who presents with near syncopal episode after having multiple episodes of diarrhea this morning.  He denies chest pain or abdominal pain.  Review of records and straight the patient was discharged 2 days ago after diuresis for CHF.  Patient currently feels quite well he does have a history of atrial fibrillation     Physical Exam   Triage Vital Signs: ED Triage Vitals  Encounter Vitals Group     BP 04/17/24 1409 (!) 139/59     Systolic BP Percentile --      Diastolic BP Percentile --      Pulse Rate 04/17/24 1409 61     Resp 04/17/24 1409 18     Temp 04/17/24 1409 98.2 F (36.8 C)     Temp Source 04/17/24 1409 Oral     SpO2 04/17/24 1409 97 %     Weight 04/17/24 1410 92.1 kg (203 lb)     Height 04/17/24 1410 1.727 m (5\' 8" )     Head Circumference --      Peak Flow --      Pain Score 04/17/24 1410 0     Pain Loc --      Pain Education --      Exclude from Growth Chart --     Most recent vital signs: Vitals:   04/17/24 1409 04/17/24 1430  BP: (!) 139/59   Pulse: 61 (!) 58  Resp: 18 16  Temp: 98.2 F (36.8 C)   SpO2: 97% 100%     General: Awake, no distress.  CV:  Good peripheral perfusion.  Resp:  Normal effort.  Abd:  No distention.  Other:     ED Results / Procedures / Treatments   Labs (all labs ordered are listed, but only abnormal results are displayed) Labs Reviewed  BASIC METABOLIC PANEL WITH GFR - Abnormal; Notable for the following components:      Result Value   Glucose, Bld 104 (*)    Creatinine, Ser 1.96 (*)    GFR, Estimated 32 (*)    All other components within normal limits  CBC - Abnormal; Notable for the following components:   RBC 3.47 (*)    Hemoglobin 9.4 (*)    HCT 31.1  (*)    RDW 16.6 (*)    All other components within normal limits  TROPONIN I (HIGH SENSITIVITY)     EKG  ED ECG REPORT I, Bryson Carbine, the attending physician, personally viewed and interpreted this ECG.  Date: 04/17/2024  Rhythm: Atrial fibrillation QRS Axis: normal Intervals: AB normal ST/T Wave abnormalities: normal Narrative Interpretation: no evidence of acute ischemia    RADIOLOGY     PROCEDURES:  Critical Care performed:   Procedures   MEDICATIONS ORDERED IN ED: Medications  sodium chloride  0.9 % bolus 500 mL (500 mLs Intravenous New Bag/Given 04/17/24 1451)     IMPRESSION / MDM / ASSESSMENT AND PLAN / ED COURSE  I reviewed the triage vital signs and the nursing notes. Patient's presentation is most consistent with acute presentation with potential threat to life or bodily function.  Patient presents with near syncopal episode, he reports multiple episodes of diarrhea this morning, feeling improved now.  No abdominal pain, no nausea or vomiting.  Daughter also has diarrhea but he has not been around her recently.  No fevers or chills.  Abdominal exam quite reassuring.  Was initially hypotensive per EMS but received IV fluids with improvement  Will give additional 500 cc, I suspect the patient was mildly dehydrated after being diuresed recently combined with diarrhea leading to hypotension  Lab work reviewed and is overall reassuring.  Additional fluids infusing, have asked my colleague to follow-up and reevaluate the patient after fluids, anticipate discharge home        FINAL CLINICAL IMPRESSION(S) / ED DIAGNOSES   Final diagnoses:  Dehydration     Rx / DC Orders   ED Discharge Orders     None        Note:  This document was prepared using Dragon voice recognition software and may include unintentional dictation errors.   Bryson Carbine, MD 04/17/24 [id number]

## 2024-04-17 NOTE — ED Triage Notes (Addendum)
 Pt BIB [facility] from Home for low BP and diarrhea. EMS reports initial BP of 95/57, given 400mL of NS, BP rose to 154/67. Pt stating diarrhea starting this morning around 1000, states has gone 4-5 times today. EMS stating daughter also having diarrhea. [facility] reports A Flutter 4:1 on monitor changing back to NSR en route.

## 2024-04-17 NOTE — ED Notes (Signed)
 Pt stating no dizziness when moving from sitting to standing position.

## 2024-04-17 NOTE — ED Notes (Signed)
 [name] called to inform of DC. Stated she would be here soon to get Donald [name].

## 2024-04-17 NOTE — Discharge Summary (Signed)
 Physician Discharge Summary   Patient: Donald [name] MRN: [id number] DOB: 28-Sep-1933  Admit date:     04/15/2024  Discharge date: 04/16/2024  Discharge Physician: Brenna Cam   PCP: Estil Heman, NP   Recommendations at discharge:   Follow-up with outpatient providers as requested  Discharge Diagnoses: Principal Problem:   Acute on chronic heart failure with preserved ejection fraction (HFpEF) (HCC) Active Problems:   Acute hypoxic respiratory failure (HCC)   Acute pulmonary edema [facility])  Hospital Course: Assessment and Plan:  89 y.o. male with medical history significant for COPD, previously on home O2, CAD, HTN, A flutter on Eliquis , CLL, lung cancer s/p radiation, CKD lllb,, BPH being admitted for CHF exacerbation   Acute on chronic HFpEF hypertensive emergency Acute hypoxic respiratory failure Patient presents with breathing, BNP 700, chest x-ray consistent with CHF He was diuresed aggressively with IV Lasix  with good response Cardiology seen and agrees with management plans Continue home antihypertensives including metoprolol , lisinopril , hydralazine , carvedilol  and amlodipine   Echocardiogram (09/2023, G2 DD, EF 65-70) Initially required BiPAP which was weaned off to nasal cannula and now on room air at discharge.  He is feeling much better and back to baseline and requesting to go home.  He is cleared by cardiology   Coronary artery disease involving native coronary artery of native heart without angina pectoris Denies chest pain and EKG nonacute.  First troponin of 15 Continue metoprolol , atorvastatin  and apixaban    Atrial fibrillation, chronic (HCC) Chronic anticoagulation Continue Eliquis  and metoprolol    CKD lllb COPD BPH CLL Followed at the [facility]         Consultants: Cardiology Disposition: Home Diet recommendation:  Discharge Diet Orders (From admission, onward)     Start     Ordered   04/16/24 0000  Diet - low sodium heart healthy         04/16/24 0929           Carb modified diet DISCHARGE MEDICATION: Allergies as of 04/16/2024       Reactions   Lovastatin Other (See Comments)   Cephalexin Rash, Hives        Medication List     STOP taking these medications    guaifenesin  400 MG Tabs tablet Commonly known as: HUMIBID E   predniSONE  10 MG (21) Tbpk tablet Commonly known as: STERAPRED UNI-PAK 21 TAB   senna-docusate 8.6-50 MG tablet Commonly known as: Senokot-S       TAKE these medications    acetaminophen  325 MG tablet Commonly known as: TYLENOL  Take 2 tablets (650 mg total) by mouth every 6 (six) hours as needed.   albuterol  108 (90 Base) MCG/ACT inhaler Commonly known as: VENTOLIN  HFA Inhale 2 puffs into the lungs every 6 (six) hours as needed for wheezing or shortness of breath.   amLODipine  10 MG tablet Commonly known as: NORVASC  Take 1 tablet (10 mg total) by mouth daily.   apixaban  2.5 MG Tabs tablet Commonly known as: ELIQUIS  Take 1 tablet (2.5 mg total) by mouth 2 (two) times daily.   atorvastatin  80 MG tablet Commonly known as: LIPITOR  Take 40 mg by mouth at bedtime.   buPROPion  150 MG 24 hr tablet Commonly known as: WELLBUTRIN  XL Take 150 mg by mouth daily.   DULoxetine  30 MG capsule Commonly known as: CYMBALTA  Take 30 mg by mouth 2 (two) times daily.   famotidine  20 MG tablet Commonly known as: PEPCID  Take 20 mg by mouth daily as needed for indigestion or heartburn.  finasteride  5 MG tablet Commonly known as: PROSCAR  Take 5 mg by mouth daily.   furosemide  40 MG tablet Commonly known as: LASIX  Take 1 tablet (40 mg total) by mouth daily. What changed:  medication strength how much to take when to take this reasons to take this additional instructions   hydrALAZINE  25 MG tablet Commonly known as: APRESOLINE  Take 1 tablet (25 mg total) by mouth 3 (three) times daily.   ipratropium 0.03 % nasal spray Commonly known as: ATROVENT  Place 2 sprays into both  nostrils daily.   Mega Multivitamin for Men Tabs Take 1 tablet by mouth daily.   metoprolol  tartrate 25 MG tablet Commonly known as: LOPRESSOR  Take 25 mg by mouth 2 (two) times daily.   tamsulosin  0.4 MG Caps capsule Commonly known as: FLOMAX  Take 0.4 mg by mouth every evening.   Tiotropium Bromide -Olodaterol 2.5-2.5 MCG/ACT Aers Inhale 2 puffs into the lungs daily.   valACYclovir  500 MG tablet Commonly known as: VALTREX  Take 500 mg by mouth every other day.        Follow-up Information     Alluri, Odessa Bene, MD. Go in 1 week(s).   Specialty: Cardiology Contact information: [address] [place] [address] [phone]         Estil Heman, NP. Schedule an appointment as soon as possible for a visit in 1 week(s).   Specialty: Nurse Practitioner Why: [facility] Discharge F/UP Contact information: [address] [place] [address] [phone]                Discharge Exam: Cleavon Curls Weights   04/16/24 0500  Weight: 87.2 kg   General: well appearing elderly male, well nourished, in no acute distress. HEENT: Normocephalic and atraumatic. Neck: No JVD.   Lungs: Normal respiratory effort on room air. Clear bilaterally to auscultation. No wheezes, crackles, rhonchi.  Heart: HRRR. Normal S1 and S2 without gallops or murmurs.  Abdomen: Non-distended appearing.  Msk: Normal strength and tone for age. Extremities: Warm and well perfused. No clubbing, cyanosis. Trace edema bilaterally.  Neuro: Alert and oriented X 3. Psych: Answers questions appropriately.   Condition at discharge: fair  The results of significant diagnostics from this hospitalization (including imaging, microbiology, ancillary and laboratory) are listed below for reference.   Imaging Studies: DG Chest 1 View Result Date: 04/15/2024 CLINICAL DATA:  Chest pain dyspnea CHF EXAM: CHEST  1 VIEW COMPARISON:  09/22/2023 FINDINGS: The lungs are symmetrically well expanded. Cardiac  size is stably enlarged. Perihilar interstitial pulmonary infiltrates slightly progressed in the interval since prior examination in keeping mild cardiogenic failure. Small left pleural effusion. No pneumothorax. Nodular opacity seen at the left apex corresponding to an area nodular scarring, better seen on CT examination of 04/15/2022. IMPRESSION: 1. Mild cardiogenic failure with small left pleural effusion. Electronically Signed   By: Worthy Heads M.D.   On: 04/15/2024 04:20    Microbiology: Results for orders placed or performed during the hospital encounter of 06/13/22  SARS Coronavirus 2 by RT PCR (hospital order, performed in [facility] hospital lab) *cepheid single result test* Anterior Nasal Swab     Status: None   Collection Time: 06/13/22  4:54 PM   Specimen: Anterior Nasal Swab  Result Value Ref Range Status   SARS Coronavirus 2 by RT PCR NEGATIVE NEGATIVE Final    Comment: (NOTE) SARS-CoV-2 target nucleic acids are NOT DETECTED.  The SARS-CoV-2 RNA is generally detectable in upper and lower respiratory specimens during the acute phase  of infection. The lowest concentration of SARS-CoV-2 viral copies this assay can detect is 250 copies / mL. A negative result does not preclude SARS-CoV-2 infection and should not be used as the sole basis for treatment or other patient management decisions.  A negative result may occur with improper specimen collection / handling, submission of specimen other than nasopharyngeal swab, presence of viral mutation(s) within the areas targeted by this assay, and inadequate number of viral copies (<250 copies / mL). A negative result must be combined with clinical observations, patient history, and epidemiological information.  Fact Sheet for Patients:   RoadLapTop.co.za  Fact Sheet for Healthcare Providers: http://kim-miller.com/  This test is not yet approved or  cleared by the [place]  FDA  and has been authorized for detection and/or diagnosis of SARS-CoV-2 by FDA under an Emergency Use Authorization (EUA).  This EUA will remain in effect (meaning this test can be used) for the duration of the COVID-19 declaration under Section 564(b)(1) of the Act, 21 U.S.C. section 360bbb-3(b)(1), unless the authorization is terminated or revoked sooner.  Performed at [facility], [address] [place], [place] [address]     Labs: CBC: Recent Labs  Lab 04/15/24 0351 04/16/24 0554  WBC 11.7* 11.1*  NEUTROABS 5.3  --   HGB 9.9* 10.2*  HCT 31.7* 32.5*  MCV 89.3 88.6  PLT 195 208   Basic Metabolic Panel: Recent Labs  Lab 04/15/24 0351 04/16/24 0554  NA 142 142  K 3.6 3.5  CL 108 105  CO2 25 27  GLUCOSE 108* 105*  BUN 16 16  CREATININE 1.56* 1.51*  CALCIUM  9.1 8.9   Liver Function Tests: Recent Labs  Lab 04/15/24 0351  AST 19  ALT 14  ALKPHOS 91  BILITOT 0.9  PROT 6.6  ALBUMIN 3.6   CBG: No results for input(s): "GLUCAP" in the last 168 hours.  Discharge time spent: greater than 30 minutes.  Signed: Brenna Cam, MD [facility] 04/17/2024

## 2024-04-17 NOTE — ED Notes (Signed)
Pt sitting on side of bed using urinal at this time.

## 2024-04-17 NOTE — ED Notes (Signed)
 Daughter, Devra Fontana, called to give update.

## 2024-04-23 ENCOUNTER — Encounter: Admitting: Nurse Practitioner

## 2024-04-30 ENCOUNTER — Observation Stay
Admission: EM | Admit: 2024-04-30 | Discharge: 2024-05-01 | Disposition: A | Discharge status: Home / Self Care | Attending: Emergency Medicine | Admitting: Emergency Medicine

## 2024-04-30 ENCOUNTER — Other Ambulatory Visit: Payer: Self-pay

## 2024-04-30 DIAGNOSIS — Z6830 Body mass index (BMI) 30.0-30.9, adult: Secondary | ICD-10-CM | POA: Insufficient documentation

## 2024-04-30 DIAGNOSIS — F0390 Unspecified dementia without behavioral disturbance: Secondary | ICD-10-CM | POA: Insufficient documentation

## 2024-04-30 DIAGNOSIS — I482 Chronic atrial fibrillation, unspecified: Secondary | ICD-10-CM | POA: Diagnosis not present

## 2024-04-30 DIAGNOSIS — I129 Hypertensive chronic kidney disease with stage 1 through stage 4 chronic kidney disease, or unspecified chronic kidney disease: Secondary | ICD-10-CM | POA: Diagnosis not present

## 2024-04-30 DIAGNOSIS — C9110 Chronic lymphocytic leukemia of B-cell type not having achieved remission: Secondary | ICD-10-CM | POA: Diagnosis not present

## 2024-04-30 DIAGNOSIS — R4182 Altered mental status, unspecified: Secondary | ICD-10-CM

## 2024-04-30 DIAGNOSIS — S065XAA Traumatic subdural hemorrhage with loss of consciousness status unknown, initial encounter: Secondary | ICD-10-CM | POA: Diagnosis not present

## 2024-04-30 DIAGNOSIS — G928 Other toxic encephalopathy: Secondary | ICD-10-CM | POA: Diagnosis not present

## 2024-04-30 DIAGNOSIS — E1122 Type 2 diabetes mellitus with diabetic chronic kidney disease: Secondary | ICD-10-CM | POA: Diagnosis not present

## 2024-04-30 DIAGNOSIS — Z8673 Personal history of transient ischemic attack (TIA), and cerebral infarction without residual deficits: Secondary | ICD-10-CM | POA: Insufficient documentation

## 2024-04-30 DIAGNOSIS — E785 Hyperlipidemia, unspecified: Secondary | ICD-10-CM | POA: Insufficient documentation

## 2024-04-30 DIAGNOSIS — E1159 Type 2 diabetes mellitus with other circulatory complications: Secondary | ICD-10-CM | POA: Diagnosis present

## 2024-04-30 DIAGNOSIS — Z87891 Personal history of nicotine dependence: Secondary | ICD-10-CM | POA: Insufficient documentation

## 2024-04-30 DIAGNOSIS — G9340 Encephalopathy, unspecified: Secondary | ICD-10-CM | POA: Diagnosis present

## 2024-04-30 DIAGNOSIS — Z79899 Other long term (current) drug therapy: Secondary | ICD-10-CM | POA: Diagnosis not present

## 2024-04-30 DIAGNOSIS — N400 Enlarged prostate without lower urinary tract symptoms: Secondary | ICD-10-CM | POA: Diagnosis not present

## 2024-04-30 DIAGNOSIS — E1140 Type 2 diabetes mellitus with diabetic neuropathy, unspecified: Secondary | ICD-10-CM | POA: Insufficient documentation

## 2024-04-30 DIAGNOSIS — R296 Repeated falls: Secondary | ICD-10-CM | POA: Diagnosis not present

## 2024-04-30 DIAGNOSIS — J449 Chronic obstructive pulmonary disease, unspecified: Secondary | ICD-10-CM | POA: Diagnosis not present

## 2024-04-30 DIAGNOSIS — E66811 Obesity, class 1: Secondary | ICD-10-CM | POA: Insufficient documentation

## 2024-04-30 DIAGNOSIS — N1832 Chronic kidney disease, stage 3b: Secondary | ICD-10-CM | POA: Diagnosis present

## 2024-04-30 DIAGNOSIS — R001 Bradycardia, unspecified: Secondary | ICD-10-CM

## 2024-04-30 DIAGNOSIS — E669 Obesity, unspecified: Secondary | ICD-10-CM | POA: Diagnosis present

## 2024-04-30 DIAGNOSIS — W19XXXA Unspecified fall, initial encounter: Secondary | ICD-10-CM | POA: Insufficient documentation

## 2024-04-30 DIAGNOSIS — Z85118 Personal history of other malignant neoplasm of bronchus and lung: Secondary | ICD-10-CM | POA: Diagnosis not present

## 2024-04-30 LAB — CBC WITH DIFFERENTIAL/PLATELET
Abs Immature Granulocytes: 0.05 10*3/uL (ref 0.00–0.07)
Basophils Absolute: 0 10*3/uL (ref 0.0–0.1)
Basophils Relative: 0 %
Eosinophils Absolute: 0 10*3/uL (ref 0.0–0.5)
Eosinophils Relative: 0 %
HCT: 33.6 % — ABNORMAL LOW (ref 39.0–52.0)
Hemoglobin: 10.3 g/dL — ABNORMAL LOW (ref 13.0–17.0)
Immature Granulocytes: 1 %
Lymphocytes Relative: 8 %
Lymphs Abs: 0.8 10*3/uL (ref 0.7–4.0)
MCH: 27.3 pg (ref 26.0–34.0)
MCHC: 30.7 g/dL (ref 30.0–36.0)
MCV: 89.1 fL (ref 80.0–100.0)
Monocytes Absolute: 0.1 10*3/uL (ref 0.1–1.0)
Monocytes Relative: 1 %
Neutro Abs: 9 10*3/uL — ABNORMAL HIGH (ref 1.7–7.7)
Neutrophils Relative %: 90 %
Platelets: 182 10*3/uL (ref 150–400)
RBC: 3.77 MIL/uL — ABNORMAL LOW (ref 4.22–5.81)
RDW: 16.9 % — ABNORMAL HIGH (ref 11.5–15.5)
WBC: 10 10*3/uL (ref 4.0–10.5)
nRBC: 0 % (ref 0.0–0.2)

## 2024-04-30 NOTE — ED Triage Notes (Signed)
 Pt brought in by EMS from home, family states that pt received new cancer medication today and pt appears weaker and more confused than normal, VSS in route, EMS report afib on monitor, controlled rate.  Upon arrival to ED, pt is AAOx4, VSS, pt is AAOx4 and has no complaints.  EMS also stated that they were called out earlier today and pt seemed more confused during earlier call and has been alert and oriented for them in route.  Pt has hx of dementia.

## 2024-04-30 NOTE — ED Provider Notes (Signed)
 [facility] Provider Note    Event Date/Time   First MD Initiated Contact with Patient 04/30/24 2306     (approximate)   History   Weakness   HPI  Donald [name] is a 89 y.o. male with history of A-flutter on Eliquis , mild dementia, CLL on rituximab, COPD, hypertension, hyperlipidemia, TIA who presents to the emergency department for concerns for altered mental status.  Patient denies any complaints at this time.  When he is asked what brought him to the emergency department he states my daughter and I had a disagreement and she thought I was trying to poop on the couch.  Spoke to patient's daughter by phone.  She states that the patient lives at home with his wife.  She states that he has mild dementia but is normally able to care for himself, ambulates with a walker.  She states yesterday she talked to him on the phone and asked him what he was doing and he told her that he was at the [facility].  She states at that time he was at home.  She picked him up and took him to the [facility] for his rituximab infusion.  She states that when they got home it took her about 20 minutes to get him out of the car because he seemed very confused about how to get out and use his walker to get inside.  She states that she checked on him again tonight and found him with his pants down sitting on the couch as if he was trying to have a bowel movement.  She states he had urinated.  Daughter reports that the last time he was this confused is when he had COVID encephalopathy.  She denies any recent head injury.  No changes in medication.  No fevers, cough, vomiting, diarrhea.   Patient has been intermittently bradycardic here in the emergency department with rates in the mid 30s when sleeping that improved to the 50s when awake.  He denies chest pain, shortness of breath, dizziness.  Daughter thinks he is on something to control his heart rate given his history of a flutter.   History  provided by patient, daughter by phone.    Past Medical History:  Diagnosis Date   Anemia    Arthritis    BPH (benign prostatic hyperplasia)    Cancer (HCC)    Lung   COPD (chronic obstructive pulmonary disease) (HCC)    Esophageal reflux    Hyperlipidemia    Hypertension    Internal carotid artery stenosis, right    Neuropathy of both feet    Pneumonia    recurrent   PTSD (post-traumatic stress disorder)    [place] vet   Sebaceous cyst    SOB (shortness of breath) on exertion    TIA (transient ischemic attack)    Urge incontinence     Past Surgical History:  Procedure Laterality Date   CYST EXCISION     buttocks   ELECTROMAGNETIC NAVIGATION BROCHOSCOPY Left 08/23/2019   Procedure: ELECTROMAGNETIC NAVIGATION BRONCHOSCOPY;  Surgeon: Tamea Dedra CROME, MD;  Location: [facility] ORS;  Service: Cardiopulmonary;  Laterality: Left;   ENDOBRONCHIAL ULTRASOUND N/A 08/23/2019   Procedure: ENDOBRONCHIAL ULTRASOUND;  Surgeon: Tamea Dedra CROME, MD;  Location: [facility] ORS;  Service: Cardiopulmonary;  Laterality: N/A;   HEMORRHOID SURGERY     shave biopsy  03/13/2020   left neck 03/13/20 [facility]igan wart with atypica inflamed +AK no carcinoma    SINUSOTOMY  VIDEO BRONCHOSCOPY WITH ENDOBRONCHIAL NAVIGATION Left 06/26/2020   Procedure: VIDEO BRONCHOSCOPY WITH ENDOBRONCHIAL NAVIGATION;  Surgeon: Tamea Dedra CROME, MD;  Location: [facility] OR[facility];  Service: Pulmonary;  Laterality: Left;    MEDICATIONS:  Prior to Admission medications   Medication Sig Start Date End Date Taking? Authorizing Provider  acetaminophen  (TYLENOL ) 325 MG tablet Take 2 tablets (650 mg total) by mouth every 6 (six) hours as needed. 09/26/23   Tobie Calix, MD  albuterol  (VENTOLIN  HFA) 108 [phone] Base) MCG/ACT inhaler Inhale 2 puffs into the lungs every 6 (six) hours as needed for wheezing or shortness of breath. 08/12/19   Tamea Dedra CROME, MD  amLODipine  (NORVASC ) 10 MG tablet Take 1 tablet (10 mg total) by mouth  daily. 09/27/23   Patel, Sona, MD  apixaban  (ELIQUIS ) 2.5 MG TABS tablet Take 1 tablet (2.5 mg total) by mouth 2 (two) times daily. 05/31/21   McLean-Scocuzza, Randine SAILOR, MD  atorvastatin  (LIPITOR ) 80 MG tablet Take 40 mg by mouth at bedtime. 03/25/23   [provider]  buPROPion  (WELLBUTRIN  XL) 150 MG 24 hr tablet Take 150 mg by mouth daily. 01/27/24   [provider]  DULoxetine  (CYMBALTA ) 30 MG capsule Take 30 mg by mouth 2 (two) times daily.    [provider]  famotidine  (PEPCID ) 20 MG tablet Take 20 mg by mouth daily as needed for indigestion or heartburn. 03/25/23   [provider]  finasteride  (PROSCAR ) 5 MG tablet Take 5 mg by mouth daily.    [provider]  furosemide  (LASIX ) 40 MG tablet Take 1 tablet (40 mg total) by mouth daily. 04/17/24 05/17/24  Maree Hue, MD  hydrALAZINE  (APRESOLINE ) 25 MG tablet Take 1 tablet (25 mg total) by mouth 3 (three) times daily. 04/16/24 05/16/24  Shah, Vipul, MD  ipratropium (ATROVENT ) 0.03 % nasal spray Place 2 sprays into both nostrils daily. 12/24/22   [provider]  loperamide  (IMODIUM  A-D) 2 MG tablet Take 1 tablet (2 mg total) by mouth 4 (four) times daily as needed for diarrhea or loose stools. 04/17/24   Bradler, Evan K, MD  metoprolol  tartrate (LOPRESSOR ) 25 MG tablet Take 25 mg by mouth 2 (two) times daily. 03/25/23   [provider]  Multiple Vitamins-Minerals (MEGA MULTIVITAMIN FOR MEN) TABS Take 1 tablet by mouth daily.    [provider]  tamsulosin  (FLOMAX ) 0.4 MG CAPS capsule Take 0.4 mg by mouth every evening. 03/25/23   [provider]  Tiotropium Bromide -Olodaterol 2.5-2.5 MCG/ACT AERS Inhale 2 puffs into the lungs daily. 02/28/20   McLean-Scocuzza, Randine SAILOR, MD  valACYclovir  (VALTREX ) 500 MG tablet Take 500 mg by mouth every other day. 12/24/23   [provider]    Physical Exam   Triage Vital Signs: ED Triage Vitals  Encounter Vitals Group     BP 04/30/24  2252 121/60     Girls Systolic BP Percentile --      Girls Diastolic BP Percentile --      Boys Systolic BP Percentile --      Boys Diastolic BP Percentile --      Pulse Rate 04/30/24 2252 79     Resp 04/30/24 2252 16     Temp 04/30/24 2252 98.3 F (36.8 C)     Temp Source 04/30/24 2252 Oral     SpO2 04/30/24 2245 97 %     Weight 04/30/24 2251 203 lb (92.1 kg)     Height 04/30/24 2251 5' 8 (1.727 m)  Head Circumference --      Peak Flow --      Pain Score 04/30/24 2254 0     Pain Loc --      Pain Education --      Exclude from Growth Chart --     Most recent vital signs: Vitals:   05/01/24 0730 05/01/24 0745  BP: (!) 162/57   Pulse:  62  Resp: 18 13  Temp:    SpO2:  100%    CONSTITUTIONAL: Alert, responds appropriately to questions.  Oriented x 3.  Elderly.  In no distress. HEAD: Normocephalic, atraumatic EYES: Conjunctivae clear, pupils appear equal, sclera nonicteric ENT: normal nose; moist mucous membranes NECK: Supple, normal ROM CARD: Irregular and bradycardic; S1 and S2 appreciated RESP: Normal chest excursion without splinting or tachypnea; breath sounds clear and equal bilaterally; no wheezes, no rhonchi, no rales, no hypoxia or respiratory distress, speaking full sentences ABD/GI: Non-distended; soft, non-tender, no rebound, no guarding, no peritoneal signs BACK: The back appears normal EXT: Normal ROM in all joints; no deformity noted, no edema SKIN: Normal color for age and race; warm; no rash on exposed skin NEURO: Moves all extremities equally, normal speech, no facial asymmetry, reports normal sensation diffusely PSYCH: The patient's mood and manner are appropriate.   ED Results / Procedures / Treatments   LABS: (all labs ordered are listed, but only abnormal results are displayed) Labs Reviewed  CBC WITH DIFFERENTIAL/PLATELET - Abnormal; Notable for the following components:      Result Value   RBC 3.77 (*)    Hemoglobin 10.3 (*)    HCT 33.6  (*)    RDW 16.9 (*)    Neutro Abs 9.0 (*)    All other components within normal limits  COMPREHENSIVE METABOLIC PANEL WITH GFR - Abnormal; Notable for the following components:   CO2 20 (*)    Glucose, Bld 165 (*)    BUN 31 (*)    Creatinine, Ser 1.73 (*)    Total Protein 6.0 (*)    Albumin 3.3 (*)    GFR, Estimated 37 (*)    All other components within normal limits  URINALYSIS, W/ REFLEX TO CULTURE (INFECTION SUSPECTED) - Abnormal; Notable for the following components:   Color, Urine YELLOW (*)    APPearance CLEAR (*)    Hgb urine dipstick MODERATE (*)    Bacteria, UA RARE (*)    All other components within normal limits  PROTIME-INR - Abnormal; Notable for the following components:   Prothrombin Time 15.5 (*)    All other components within normal limits  TROPONIN I (HIGH SENSITIVITY) - Abnormal; Notable for the following components:   Troponin I (High Sensitivity) 18 (*)    All other components within normal limits  TROPONIN I (HIGH SENSITIVITY) - Abnormal; Notable for the following components:   Troponin I (High Sensitivity) 19 (*)    All other components within normal limits  RESP PANEL BY RT-PCR (RSV, FLU A&B, COVID)  RVPGX2  MAGNESIUM   TSH  T4, FREE     EKG:  EKG Interpretation Date/Time:  Friday April 30 2024 22:44:15 EDT Ventricular Rate:  80 PR Interval:    QRS Duration:  149 QT Interval:  432 QTC Calculation: 453 R Axis:   229  Text Interpretation: Atrial flutter Right bundle branch block No significant change since last tracing Confirmed by Neomi Neptune [phone]) on 04/30/2024 11:43:43 PM         RADIOLOGY: My personal review and interpretation of  imaging: CT head shows bilateral chronic appearing subdural hemorrhages.  I have personally reviewed all radiology reports.   CT HEAD WO CONTRAST ( ) Result Date: 05/01/2024 EXAM: CT HEAD WITHOUT CONTRAST 05/01/2024 07:22:25 AM TECHNIQUE: CT of the head was performed without the administration of  intravenous contrast. Automated exposure control, iterative reconstruction, and/or weight based adjustment of the mA/kV was utilized to reduce the radiation dose to as low as reasonably achievable. COMPARISON: CT head without contrast 05/01/2024 at 12:15 am. MRI head without contrast 09/25/2023. CLINICAL HISTORY: Follow-up bilateral subdural hematomas. FINDINGS: BRAIN AND VENTRICLES: The lentiform extraaxial hemorrhage over the right convexity is stable in size, measuring 13 mm. A 6 mm extraaxial hemorrhage over the left convexity is also stable. Gray-white differentiation is preserved. No hydrocephalus. No extra-axial collection. No mass effect or midline shift. Note atrophy and white matter disease is stable. Remote lacunar infarcts are present in the right corona radiata. ORBITS: Bilateral lens replacements are noted. The globes and orbits are otherwise within normal limits. SINUSES: No acute abnormality. SOFT TISSUES AND SKULL: No acute soft tissue abnormality. No skull fracture. No significant midline shift is present. IMPRESSION: 1. Stable lentiform extraaxial hemorrhage over the right convexity measuring 13 mm and 6 mm extraaxial hemorrhage over the left convexity. No significant midline shift. 2. Stable atrophy and white matter disease. Electronically signed by: Lonni Necessary MD 05/01/2024 07:29 AM EDT RP Workstation: [id number]   CT HEAD WO CONTRAST ( ) Result Date: 05/01/2024 CLINICAL DATA:  Altered mental status EXAM: CT HEAD WITHOUT CONTRAST TECHNIQUE: Contiguous axial images were obtained from the base of the skull through the vertex without intravenous contrast. RADIATION DOSE REDUCTION: This exam was performed according to the departmental dose-optimization program which includes automated exposure control, adjustment of the mA and/or kV according to patient size and/or use of iterative reconstruction technique. COMPARISON:  04/15/2022 head CT 09/25/2023 brain MRI FINDINGS: Brain: There is  a right hemispheric mixed density, predominantly hypodense subdural collection measuring up to 15 mm in thickness. There is a smaller collection with similar characteristics on the left that measures 6 mm in thickness. No midline shift or significant mass effect. There is an old right basal ganglia small vessel infarct. Vascular: Calcification along the course of the right MCA. Skull: Normal. Negative for fracture or focal lesion. Sinuses/Orbits: No acute finding. Other: None IMPRESSION: Bilateral subacute to chronic subdural hematomas measuring 15 mm on the right and 6 mm on the left. Critical Value/emergent results were called by telephone at the time of interpretation on 05/01/2024 at 12:32 am to provider [facility] , who verbally acknowledged these results. Electronically Signed   By: Franky Stanford M.D.   On: 05/01/2024 00:32     PROCEDURES:  Critical Care performed: Yes, see critical care procedure note(s)   CRITICAL CARE Performed by: Josette Alicia Ackert   Total critical care time: 30 minutes  Critical care time was exclusive of separately billable procedures and treating other patients.  Critical care was necessary to treat or prevent imminent or life-threatening deterioration.  Critical care was time spent personally by me on the following activities: development of treatment plan with patient and/or surrogate as well as nursing, discussions with consultants, evaluation of patient's response to treatment, examination of patient, obtaining history from patient or surrogate, ordering and performing treatments and interventions, ordering and review of laboratory studies, ordering and review of radiographic studies, pulse oximetry and re-evaluation of patient's condition.   SABRA1-3 Lead EKG Interpretation  Performed by: Dariona Postma, Josette SAILOR, DO Authorized  by: Neomi Josette SAILOR, DO     Interpretation: abnormal     ECG rate:  54   ECG rate assessment: bradycardic     Rhythm: atrial flutter      Ectopy: none     Conduction: normal       IMPRESSION / MDM / ASSESSMENT AND PLAN / ED COURSE  I reviewed the triage vital signs and the nursing notes.    Patient here with altered mental status.  Oriented x 3 currently with no focal neurodeficits or acute complaints.  Intermittently bradycardic when he falls asleep into the 30s.  Does have history of atrial flutter.  Normotensive here.  The patient is on the cardiac monitor to evaluate for evidence of arrhythmia and/or significant heart rate changes.   DIFFERENTIAL DIAGNOSIS (includes but not limited to):   ACS, arrhythmia, sick sinus syndrome, bradycardia secondary to beta-blockers or other rate controlling medications for his atrial flutter, intracranial hemorrhage, stroke, UTI, worsening dementia, side effect of his rituximab infusion, COVID encephalopathy, metabolic encephalopathy, hepatic encephalopathy   Patient's presentation is most consistent with acute presentation with potential threat to life or bodily function.   PLAN: Will obtain labs, urine, CT head.  Will place on cardiac monitoring.  Patient currently oriented x 3, neuro intact without complaints.  Intermittently bradycardic but normotensive.   MEDICATIONS GIVEN IN ED: Medications  hydrALAZINE  (APRESOLINE ) tablet 25 mg (has no administration in time range)  albuterol  (PROVENTIL ) (2.5 MG/3ML) 0.083% nebulizer solution 2.5 mg (has no administration in time range)  acetaminophen  (TYLENOL ) tablet 650 mg (has no administration in time range)    Or  acetaminophen  (TYLENOL ) suppository 650 mg (has no administration in time range)  ondansetron  (ZOFRAN ) tablet 4 mg (has no administration in time range)    Or  ondansetron  (ZOFRAN ) injection 4 mg (has no administration in time range)  HYDROcodone -acetaminophen  (NORCO/VICODIN) 5-325 MG per tablet 1-2 tablet (2 tablets Oral Given 05/01/24 0713)     ED COURSE: COVID, flu and RSV negative.  Troponin x 2 mildly elevated but  flat.  Urine shows red blood cells but no other sign of infection.  Mild anemia which is chronic.  Mild chronic kidney disease which is stable.  Normal glucose.  TSH, free T4 normal.  CT head reviewed and interpreted by myself and the radiologist and shows bilateral chronic appearing subdural hematomas.  Family denies any recent falls.  He is not having any head injury or focal neurodeficits currently.  He is on Eliquis  per his daughter's report.  No indication currently for reversal given these appear chronic on my review as well as the radiologist review of imaging.  Will continue to monitor closely but given this episode of encephalopathy although appears to have improved, have recommended at least observation admission.  Daughter and patient comfortable with this plan.    We can also monitor his bradycardia which may be secondary to medications for rate control for his atrial flutter.  Unfortunately he does not come with a medication list.  On review of previous records that the [facility] it looks like his heart rate is normally in the 60s to 70s but it is unclear if he is on a beta-blocker.   CONSULTS:  Consulted and discussed patient's case with hospitalist, Dr. Cleatus.  I have recommended admission and consulting physician agrees and will place admission orders.  Patient (and family if present) agree with this plan.   I reviewed all nursing notes, vitals, pertinent previous records.  All labs, EKGs,  imaging ordered have been independently reviewed and interpreted by myself.    OUTSIDE RECORDS REVIEWED: Reviewed recent records at the [facility].       FINAL CLINICAL IMPRESSION(S) / ED DIAGNOSES   Final diagnoses:  Subacute subdural hematoma (HCC)  Altered mental status, unspecified altered mental status type  Bradycardia     Rx / DC Orders   ED Discharge Orders     None        Note:  This document was prepared using Dragon voice recognition software and may include unintentional dictation  errors.   [employer], Josette SAILOR, DO 05/01/24 [phone]

## 2024-05-01 ENCOUNTER — Encounter: Payer: Self-pay | Admitting: Internal Medicine

## 2024-05-01 ENCOUNTER — Emergency Department

## 2024-05-01 ENCOUNTER — Observation Stay

## 2024-05-01 DIAGNOSIS — G9340 Encephalopathy, unspecified: Secondary | ICD-10-CM

## 2024-05-01 DIAGNOSIS — S065XAA Traumatic subdural hemorrhage with loss of consciousness status unknown, initial encounter: Secondary | ICD-10-CM | POA: Diagnosis present

## 2024-05-01 LAB — URINALYSIS, W/ REFLEX TO CULTURE (INFECTION SUSPECTED)
Bilirubin Urine: NEGATIVE
Glucose, UA: NEGATIVE mg/dL
Ketones, ur: NEGATIVE mg/dL
Leukocytes,Ua: NEGATIVE
Nitrite: NEGATIVE
Protein, ur: NEGATIVE mg/dL
Specific Gravity, Urine: 1.013 (ref 1.005–1.030)
pH: 5 (ref 5.0–8.0)

## 2024-05-01 LAB — RESP PANEL BY RT-PCR (RSV, FLU A&B, COVID)  RVPGX2
Influenza A by PCR: NEGATIVE
Influenza B by PCR: NEGATIVE
Resp Syncytial Virus by PCR: NEGATIVE
SARS Coronavirus 2 by RT PCR: NEGATIVE

## 2024-05-01 LAB — COMPREHENSIVE METABOLIC PANEL WITH GFR
ALT: 16 U/L (ref 0–44)
AST: 33 U/L (ref 15–41)
Albumin: 3.3 g/dL — ABNORMAL LOW (ref 3.5–5.0)
Alkaline Phosphatase: 65 U/L (ref 38–126)
Anion gap: 11 (ref 5–15)
BUN: 31 mg/dL — ABNORMAL HIGH (ref 8–23)
CO2: 20 mmol/L — ABNORMAL LOW (ref 22–32)
Calcium: 9.3 mg/dL (ref 8.9–10.3)
Chloride: 106 mmol/L (ref 98–111)
Creatinine, Ser: 1.73 mg/dL — ABNORMAL HIGH (ref 0.61–1.24)
GFR, Estimated: 37 mL/min — ABNORMAL LOW (ref >60)
Glucose, Bld: 165 mg/dL — ABNORMAL HIGH (ref 70–99)
Potassium: 4.2 mmol/L (ref 3.5–5.1)
Sodium: 137 mmol/L (ref 135–145)
Total Bilirubin: 0.6 mg/dL (ref 0.0–1.2)
Total Protein: 6 g/dL — ABNORMAL LOW (ref 6.5–8.1)

## 2024-05-01 LAB — PROTIME-INR
INR: 1.2 (ref 0.8–1.2)
Prothrombin Time: 15.5 s — ABNORMAL HIGH (ref 11.4–15.2)

## 2024-05-01 LAB — T4, FREE: Free T4: 0.67 ng/dL (ref 0.61–1.12)

## 2024-05-01 LAB — TROPONIN I (HIGH SENSITIVITY)
Troponin I (High Sensitivity): 18 ng/L — ABNORMAL HIGH (ref <18)
Troponin I (High Sensitivity): 19 ng/L — ABNORMAL HIGH (ref <18)

## 2024-05-01 LAB — MAGNESIUM: Magnesium: 2.2 mg/dL (ref 1.7–2.4)

## 2024-05-01 LAB — TSH: TSH: 1.118 u[IU]/mL (ref 0.350–4.500)

## 2024-05-01 MED ORDER — TAMSULOSIN HCL 0.4 MG PO CAPS: 0.4000 mg | ORAL_CAPSULE | Freq: Every evening | ORAL | Status: DC

## 2024-05-01 MED ORDER — ONDANSETRON HCL 4 MG PO TABS
4.0000 mg | ORAL_TABLET | Freq: Four times a day (QID) | ORAL | Status: DC | PRN
Start: 2024-05-01 — End: 2024-05-01

## 2024-05-01 MED ORDER — AMLODIPINE BESYLATE 5 MG PO TABS
10.0000 mg | ORAL_TABLET | Freq: Every day | ORAL | Status: DC
  Administered 2024-05-01: 10 mg via ORAL
  Filled 2024-05-01: qty 2

## 2024-05-01 MED ORDER — IPRATROPIUM BROMIDE 0.03 % NA SOLN
2.0000 | Freq: Every day | NASAL | Status: DC
  Administered 2024-05-01: 2 via NASAL
  Filled 2024-05-01: qty 30

## 2024-05-01 MED ORDER — ACETAMINOPHEN 650 MG RE SUPP: 650.0000 mg | Freq: Four times a day (QID) | RECTAL | Status: DC | PRN

## 2024-05-01 MED ORDER — ACETAMINOPHEN 325 MG PO TABS
650.0000 mg | ORAL_TABLET | Freq: Four times a day (QID) | ORAL | Status: DC | PRN
Start: 2024-05-01 — End: 2024-05-01

## 2024-05-01 MED ORDER — HYDROCODONE-ACETAMINOPHEN 5-325 MG PO TABS
1.0000 | ORAL_TABLET | ORAL | Status: DC | PRN
  Administered 2024-05-01: 2 via ORAL
  Filled 2024-05-01: qty 2

## 2024-05-01 MED ORDER — HYDRALAZINE HCL 50 MG PO TABS
25.0000 mg | ORAL_TABLET | Freq: Three times a day (TID) | ORAL | Status: DC
  Administered 2024-05-01: 25 mg via ORAL
  Filled 2024-05-01: qty 1

## 2024-05-01 MED ORDER — FINASTERIDE 5 MG PO TABS
5.0000 mg | ORAL_TABLET | Freq: Every day | ORAL | Status: DC
Start: 2024-05-01 — End: 2024-05-01
  Administered 2024-05-01: 5 mg via ORAL
  Filled 2024-05-01: qty 1

## 2024-05-01 MED ORDER — BUPROPION HCL ER (XL) 150 MG PO TB24
150.0000 mg | ORAL_TABLET | Freq: Every day | ORAL | Status: DC
  Administered 2024-05-01: 150 mg via ORAL
  Filled 2024-05-01: qty 1

## 2024-05-01 MED ORDER — DULOXETINE HCL 30 MG PO CPEP
30.0000 mg | ORAL_CAPSULE | Freq: Two times a day (BID) | ORAL | Status: DC
  Administered 2024-05-01: 30 mg via ORAL
  Filled 2024-05-01: qty 1

## 2024-05-01 MED ORDER — ALBUTEROL SULFATE (2.5 MG/3ML) 0.083% IN NEBU: 2.5000 mg | INHALATION_SOLUTION | Freq: Four times a day (QID) | RESPIRATORY_TRACT | Status: DC | PRN

## 2024-05-01 MED ORDER — METOPROLOL TARTRATE 25 MG PO TABS
25.0000 mg | ORAL_TABLET | Freq: Two times a day (BID) | ORAL | Status: DC
  Administered 2024-05-01: 25 mg via ORAL
  Filled 2024-05-01: qty 1

## 2024-05-01 MED ORDER — ATORVASTATIN CALCIUM 20 MG PO TABS: 40.0000 mg | ORAL_TABLET | Freq: Every day | ORAL | Status: DC

## 2024-05-01 MED ORDER — VALACYCLOVIR HCL 500 MG PO TABS
500.0000 mg | ORAL_TABLET | ORAL | Status: DC
  Administered 2024-05-01: 500 mg via ORAL
  Filled 2024-05-01: qty 1

## 2024-05-01 MED ORDER — ONDANSETRON HCL 4 MG/2ML IJ SOLN: 4.0000 mg | Freq: Four times a day (QID) | INTRAMUSCULAR | Status: DC | PRN

## 2024-05-01 NOTE — Discharge Summary (Signed)
 Physician Discharge Summary   Patient: Donald [name] MRN: [id number] DOB: [birth year]  Admit date:     04/30/2024  Discharge date: 05/01/24  Discharge Physician: Delon Herald   PCP: Towana Iona CROME, NP   Recommendations at discharge:   You are being discharged back to home with prior services Hold Eliquis  (apixiban) for 1 week [facility] with [facility] about this episode to determine future treatment planning Follow up next week with NP [facility]  Discharge Diagnoses: Principal Problem:   Acute toxic and metabolic encephalopathy Active Problems:   COPD   Atrial fibrillation, chronic (HCC)   CLL   Obesity (BMI 30-39.9)   Frequent falls   Hypertension associated with diabetes (HCC)   Stage 3b chronic kidney disease (HCC)   Subdural hematoma [facility])    Hospital Course:  Donald [name] is a 89 y.o. male with medical history significant of BPH, lung CA, COPD, HTN, HLD, and TIA who presented on 6/20 with AMS.   He reports that he is here because of pain.  He remains mildly confused.  He does acknowledge a fall about a week ago where he struck a cabinet with his head. Symptoms started on the way home from his initial CLL treatment yesterday, which was about a 6 hour infusion.  Symptoms are thought to be related to the infusion itself vs. Overexertion given the 6 hours involved.  Regardless, he is back to baseline.  Neurosurgery reviewed imaging and SDH appears to be chronic and stable; recommend holding Eliquis  x 1 week.  PT evaluated and recommends return to home health PT through the [facility].  His daughter is comfortable with takinghim home today, as he is back to baseline.  Assessment and Plan:  Acute encephalopathy Patient with baseline dementia Received initial treatment for CLL at the [facility] yesterday and had profound fatigue with confusion to follow He currently appears to be back to baseline He was found to have subacute vs. Chronic SDH - see below At this time, this is  considered to be less likely related to his presentation He was placed in observation status overnight Daughter believes that he is back to relative baseline and is comfortable with dc to home today   SDH Patient with apparent fall at home given R periorbital ecchymosis (remote); he acknowledges a fall with head trauma within the last week B subacute to chronic SDH on CT Stable on repeat Neurosurgery has reviewed the images and agrees that this appears to be chronic and stable He appears to be stable for dc at this time He is on Eliquis  and this does not appear to need reversal but will hold Eliquis  for a week to ensure stability   Frequent falls PT evaluation in the ER to ensure stability Fall precautions at home Recommended to return home with home health via the [facility]   CLL Initial encounter at the [facility] for rituximab-PVVR yesterday While it is impossible to say whether his AMS was related to fatigue following 6 hours of infusion or to the drug itself, this was likely related to his presentation Will defer to family/[facility] about whether to continue infusions   COPD Continue albuterol  He is no longer taking tiotropium   BPH Continue finasteride , tamsulosin    HTN Continue amlodipine , metoprolol , hydralazine    HLD Continue atorvastatin    Chronic afib Rate controlled with metoprolol  Hold Eliquis  for now given SDH   Dementia Daughter reports MCI at baseline Worsened confusion and generalized weakness led to presentation, as above Continue bupropion , Cymbalta   Stage 3b CKD Appears to be stable at this time Attempt to avoid nephrotoxic medications   Class 1 obesity Body mass index is 30.87 kg/m.SABRA  Weight loss should be encouraged Outpatient PCP/bariatric medicine f/u encouraged Significantly low or high BMI is associated with higher medical risk including morbidity and mortality      Pain control - [place]  Controlled Substance Reporting System database was  reviewed. and patient was instructed, not to drive, operate heavy machinery, perform activities at heights, swimming or participation in water activities or provide baby-sitting services while on Pain, Sleep and Anxiety Medications; until their outpatient Physician has advised to do so again. Also recommended to not to take more than prescribed Pain, Sleep and Anxiety Medications.   Disposition: Home Diet recommendation:  Regular diet DISCHARGE MEDICATION: Allergies as of 05/01/2024       Reactions   Lovastatin Other (See Comments)   Cephalexin Rash, Hives        Medication List     PAUSE taking these medications    apixaban  2.5 MG Tabs tablet Wait to take this until: May 08, 2024 Commonly known as: ELIQUIS  Take 1 tablet (2.5 mg total) by mouth 2 (two) times daily.       STOP taking these medications    lisinopril  10 MG tablet Commonly known as: ZESTRIL    loperamide  2 MG tablet Commonly known as: Imodium  A-D   Tiotropium Bromide -Olodaterol 2.5-2.5 MCG/ACT Aers       TAKE these medications    acetaminophen  325 MG tablet Commonly known as: TYLENOL  Take 2 tablets (650 mg total) by mouth every 6 (six) hours as needed.   albuterol  108 (90 Base) MCG/ACT inhaler Commonly known as: VENTOLIN  HFA Inhale 2 puffs into the lungs every 6 (six) hours as needed for wheezing or shortness of breath.   amLODipine  10 MG tablet Commonly known as: NORVASC  Take 1 tablet (10 mg total) by mouth daily.   atorvastatin  80 MG tablet Commonly known as: LIPITOR  Take 40 mg by mouth at bedtime.   buPROPion  150 MG 24 hr tablet Commonly known as: WELLBUTRIN  XL Take 150 mg by mouth daily.   DULoxetine  30 MG capsule Commonly known as: CYMBALTA  Take 30 mg by mouth 2 (two) times daily.   famotidine  20 MG tablet Commonly known as: PEPCID  Take 20 mg by mouth daily as needed for indigestion or heartburn.   finasteride  5 MG tablet Commonly known as: PROSCAR  Take 5 mg by mouth daily.    furosemide  40 MG tablet Commonly known as: LASIX  Take 1 tablet (40 mg total) by mouth daily.   hydrALAZINE  25 MG tablet Commonly known as: APRESOLINE  Take 1 tablet (25 mg total) by mouth 3 (three) times daily.   ipratropium 0.03 % nasal spray Commonly known as: ATROVENT  Place 2 sprays into both nostrils daily.   Mega Multivitamin for Men Tabs Take 1 tablet by mouth daily.   metoprolol  tartrate 25 MG tablet Commonly known as: LOPRESSOR  Take 25 mg by mouth 2 (two) times daily.   tamsulosin  0.4 MG Caps capsule Commonly known as: FLOMAX  Take 0.4 mg by mouth every evening.   valACYclovir  500 MG tablet Commonly known as: VALTREX  Take 500 mg by mouth every other day.        Discharge Exam: Filed Weights   04/30/24 2251  Weight: 92.1 kg   See H&P from earlier today  Condition at discharge: stable  The results of significant diagnostics from this hospitalization (including imaging, microbiology, ancillary and laboratory) are listed  below for reference.   Imaging Studies: CT HEAD WO CONTRAST ( ) Result Date: 05/01/2024 EXAM: CT HEAD WITHOUT CONTRAST 05/01/2024 07:22:25 AM TECHNIQUE: CT of the head was performed without the administration of intravenous contrast. Automated exposure control, iterative reconstruction, and/or weight based adjustment of the mA/kV was utilized to reduce the radiation dose to as low as reasonably achievable. COMPARISON: CT head without contrast 05/01/2024 at 12:15 am. MRI head without contrast 09/25/2023. CLINICAL HISTORY: Follow-up bilateral subdural hematomas. FINDINGS: BRAIN AND VENTRICLES: The lentiform extraaxial hemorrhage over the right convexity is stable in size, measuring 13 mm. A 6 mm extraaxial hemorrhage over the left convexity is also stable. Gray-white differentiation is preserved. No hydrocephalus. No extra-axial collection. No mass effect or midline shift. Note atrophy and white matter disease is stable. Remote lacunar infarcts are  present in the right corona radiata. ORBITS: Bilateral lens replacements are noted. The globes and orbits are otherwise within normal limits. SINUSES: No acute abnormality. SOFT TISSUES AND SKULL: No acute soft tissue abnormality. No skull fracture. No significant midline shift is present. IMPRESSION: 1. Stable lentiform extraaxial hemorrhage over the right convexity measuring 13 mm and 6 mm extraaxial hemorrhage over the left convexity. No significant midline shift. 2. Stable atrophy and white matter disease. Electronically signed by: Lonni Necessary MD 05/01/2024 07:29 AM EDT RP Workstation: [id number]   CT HEAD WO CONTRAST ( ) Result Date: 05/01/2024 CLINICAL DATA:  Altered mental status EXAM: CT HEAD WITHOUT CONTRAST TECHNIQUE: Contiguous axial images were obtained from the base of the skull through the vertex without intravenous contrast. RADIATION DOSE REDUCTION: This exam was performed according to the departmental dose-optimization program which includes automated exposure control, adjustment of the mA and/or kV according to patient size and/or use of iterative reconstruction technique. COMPARISON:  04/15/2022 head CT 09/25/2023 brain MRI FINDINGS: Brain: There is a right hemispheric mixed density, predominantly hypodense subdural collection measuring up to 15 mm in thickness. There is a smaller collection with similar characteristics on the left that measures 6 mm in thickness. No midline shift or significant mass effect. There is an old right basal ganglia small vessel infarct. Vascular: Calcification along the course of the right MCA. Skull: Normal. Negative for fracture or focal lesion. Sinuses/Orbits: No acute finding. Other: None IMPRESSION: Bilateral subacute to chronic subdural hematomas measuring 15 mm on the right and 6 mm on the left. Critical Value/emergent results were called by telephone at the time of interpretation on 05/01/2024 at 12:32 am to provider [facility] , who verbally  acknowledged these results. Electronically Signed   By: Franky Stanford M.D.   On: 05/01/2024 00:32   DG Chest 1 View Result Date: 04/15/2024 CLINICAL DATA:  Chest pain dyspnea CHF EXAM: CHEST  1 VIEW COMPARISON:  09/22/2023 FINDINGS: The lungs are symmetrically well expanded. Cardiac size is stably enlarged. Perihilar interstitial pulmonary infiltrates slightly progressed in the interval since prior examination in keeping mild cardiogenic failure. Small left pleural effusion. No pneumothorax. Nodular opacity seen at the left apex corresponding to an area nodular scarring, better seen on CT examination of 04/15/2022. IMPRESSION: 1. Mild cardiogenic failure with small left pleural effusion. Electronically Signed   By: Dorethia Molt M.D.   On: 04/15/2024 04:20    Microbiology: Results for orders placed or performed during the hospital encounter of 04/30/24  Resp panel by RT-PCR (RSV, Flu A&B, Covid) Anterior Nasal Swab     Status: None   Collection Time: 05/01/24  2:22 AM   Specimen: Anterior Nasal Swab  Result Value Ref Range Status   SARS Coronavirus 2 by RT PCR NEGATIVE NEGATIVE Final    Comment: (NOTE) SARS-CoV-2 target nucleic acids are NOT DETECTED.  The SARS-CoV-2 RNA is generally detectable in upper respiratory specimens during the acute phase of infection. The lowest concentration of SARS-CoV-2 viral copies this assay can detect is 138 copies/mL. A negative result does not preclude SARS-Cov-2 infection and should not be used as the sole basis for treatment or other patient management decisions. A negative result may occur with  improper specimen collection/handling, submission of specimen other than nasopharyngeal swab, presence of viral mutation(s) within the areas targeted by this assay, and inadequate number of viral copies(<138 copies/mL). A negative result must be combined with clinical observations, patient history, and epidemiological information. The expected result is  Negative.  Fact Sheet for Patients:  BloggerCourse.com  Fact Sheet for Healthcare Providers:  SeriousBroker.it  This test is no t yet approved or cleared by the [place]  FDA and  has been authorized for detection and/or diagnosis of SARS-CoV-2 by FDA under an Emergency Use Authorization (EUA). This EUA will remain  in effect (meaning this test can be used) for the duration of the COVID-19 declaration under Section 564(b)(1) of the Act, 21 U.S.C.section 360bbb-3(b)(1), unless the authorization is terminated  or revoked sooner.       Influenza A by PCR NEGATIVE NEGATIVE Final   Influenza B by PCR NEGATIVE NEGATIVE Final    Comment: (NOTE) The Xpert Xpress SARS-CoV-2/FLU/RSV plus assay is intended as an aid in the diagnosis of influenza from Nasopharyngeal swab specimens and should not be used as a sole basis for treatment. Nasal washings and aspirates are unacceptable for Xpert Xpress SARS-CoV-2/FLU/RSV testing.  Fact Sheet for Patients: BloggerCourse.com  Fact Sheet for Healthcare Providers: SeriousBroker.it  This test is not yet approved or cleared by the [place]  FDA and has been authorized for detection and/or diagnosis of SARS-CoV-2 by FDA under an Emergency Use Authorization (EUA). This EUA will remain in effect (meaning this test can be used) for the duration of the COVID-19 declaration under Section 564(b)(1) of the Act, 21 U.S.C. section 360bbb-3(b)(1), unless the authorization is terminated or revoked.     Resp Syncytial Virus by PCR NEGATIVE NEGATIVE Final    Comment: (NOTE) Fact Sheet for Patients: BloggerCourse.com  Fact Sheet for Healthcare Providers: SeriousBroker.it  This test is not yet approved or cleared by the [place]  FDA and has been authorized for detection and/or diagnosis of  SARS-CoV-2 by FDA under an Emergency Use Authorization (EUA). This EUA will remain in effect (meaning this test can be used) for the duration of the COVID-19 declaration under Section 564(b)(1) of the Act, 21 U.S.C. section 360bbb-3(b)(1), unless the authorization is terminated or revoked.  Performed at [facility], [address] [place], [place] [address]     Labs: CBC: Recent Labs  Lab 04/30/24 2256  WBC 10.0  NEUTROABS 9.0*  HGB 10.3*  HCT 33.6*  MCV 89.1  PLT 182   Basic Metabolic Panel: Recent Labs  Lab 04/30/24 2256  NA 137  K 4.2  CL 106  CO2 20*  GLUCOSE 165*  BUN 31*  CREATININE 1.73*  CALCIUM  9.3  MG 2.2   Liver Function Tests: Recent Labs  Lab 04/30/24 2256  AST 33  ALT 16  ALKPHOS 65  BILITOT 0.6  PROT 6.0*  ALBUMIN 3.3*   CBG: No results for input(s): GLUCAP in the last 168 hours.  Discharge time spent:  greater than 30 minutes.  Signed: Delon Herald, MD [facility] 05/01/2024

## 2024-05-01 NOTE — ED Notes (Signed)
 Called CCMD and changed pt's room number.   Pt's brief changed and clean gown, socks, and fall band put on pt.

## 2024-05-01 NOTE — ED Notes (Signed)
 Pt catheterized for urine sample. Sterile technique used, urine sent to lab.  Pt tolerated well.

## 2024-05-01 NOTE — ED Notes (Signed)
 Pt cleaned, new brief placed on pt

## 2024-05-01 NOTE — Evaluation (Signed)
 Physical Therapy Evaluation Patient Details Name: Donald [name] MRN: [id number] DOB: 04/20/1933 Today's Date: 05/01/2024  History of Present Illness  Pt is an 89 yo male that presented to the ED from home for AMS. Workup noted subacute vs chronic bilateral subdural hemorrhages. PMH of BPH, lung CA, COPD, HTN, HLD, PTSD, and TIA, CHF, aflutter on eliquis .  Clinical Impression  Patient alert, agreeable to PT, oriented to self, place, family in room, per family pt appears to be near cognitive baseline. Pt lives with wife and daughter checks in, also PCA to assist Monday-Friday 3hrs a day to assist with IADLs, (ADLs if needed). Bed mobility with supervision, able to stand with CGA and RW. Able to ambulate ~150ft, CGA with RW. No unsteadiness noted, mild SOB noted HR in 60-70s, spO2 >90% on room air. Pt also displayed decreased bilateral ankle DF/PF, 3+/5. Returned to bed with needs in reach.  Overall the patient demonstrated deficits (see PT Problem List) that impede the patient's functional abilities, safety, and mobility and would benefit from skilled PT intervention, pt currently having HHPT, MD advised of benefit of continuing service.         If plan is discharge home, recommend the following: A little help with bathing/dressing/bathroom;Assistance with cooking/housework;Assist for transportation;Help with stairs or ramp for entrance   Can travel by private vehicle        Equipment Recommendations None recommended by PT  Recommendations for Other Services       Functional Status Assessment Patient has had a recent decline in their functional status and demonstrates the ability to make significant improvements in function in a reasonable and predictable amount of time.     Precautions / Restrictions Precautions Precautions: Fall Recall of Precautions/Restrictions: Impaired Restrictions Weight Bearing Restrictions Per Provider Order: No      Mobility  Bed Mobility Overal  bed mobility: Needs Assistance Bed Mobility: Supine to Sit, Sit to Supine     Supine to sit: Supervision Sit to supine: Supervision, Used rails        Transfers Overall transfer level: Needs assistance Equipment used: Rolling walker (2 wheels) Transfers: Sit to/from Stand Sit to Stand: Contact guard assist                Ambulation/Gait Ambulation/Gait assistance: Contact guard assist Gait Distance (Feet): 150 Feet Assistive device: Rolling walker (2 wheels)         General Gait Details: no unsteadiness noted, a little SOB but spO2 >90% and HR in 60-70s with activity  Stairs            Wheelchair Mobility     Tilt Bed    Modified Rankin (Stroke Patients Only)       Balance Overall balance assessment: Needs assistance Sitting-balance support: Feet supported Sitting balance-Leahy Scale: Good     Standing balance support: Bilateral upper extremity supported Standing balance-Leahy Scale: Fair                               Pertinent Vitals/Pain Pain Assessment Pain Assessment: No/denies pain    Home Living Family/patient expects to be discharged to:: Private residence Living Arrangements: Spouse/significant other Available Help at Discharge: Family;Available PRN/intermittently;Personal care attendant (daughter also present daily, PCA 3 hrs a day mon-fri to assist with IADLs, bathing as needed (meals, meds, cleaning)) Type of Home: House Home Access: Ramped entrance       Home Layout: One level Home Equipment: Shower seat -  built in;Grab bars - tub/shower;Grab bars - toilet;Patent examiner (4 wheels)      Prior Function Prior Level of Function : Independent/Modified Independent             Mobility Comments: RW at home, 1 fall recently per pt ADLs Comments: daughter assists with dressing/ADLs as needed     Extremity/Trunk Assessment   Upper Extremity Assessment Upper Extremity Assessment: Overall WFL for tasks  assessed    Lower Extremity Assessment Lower Extremity Assessment:  (MMT 4-/5, except ankle DF/PF 3+/5 bilaterally)       Communication        Cognition Arousal: Alert Behavior During Therapy: WFL for tasks assessed/performed                           PT - Cognition Comments: oriented to self, place, month/year (after time), oriented to family in room Following commands: Intact       Cueing       General Comments      Exercises     Assessment/Plan    PT Assessment Patient needs continued PT services  PT Problem List Decreased strength;Pain;Decreased range of motion;Decreased activity tolerance;Decreased balance;Decreased mobility       PT Treatment Interventions DME instruction;Balance training;Gait training;Neuromuscular re-education;Stair training;Functional mobility training;Patient/family education;Therapeutic activities;Therapeutic exercise    PT Goals (Current goals can be found in the Care Plan section)  Acute Rehab PT Goals Patient Stated Goal: to go home PT Goal Formulation: With patient Time For Goal Achievement: 05/15/24 Potential to Achieve Goals: Good    Frequency Min 2X/week     Co-evaluation               AM-PAC PT 6 Clicks Mobility  Outcome Measure Help needed turning from your back to your side while in a flat bed without using bedrails?: A Little Help needed moving from lying on your back to sitting on the side of a flat bed without using bedrails?: A Little Help needed moving to and from a bed to a chair (including a wheelchair)?: A Little Help needed standing up from a chair using your arms (e.g., wheelchair or bedside chair)?: A Little Help needed to walk in hospital room?: A Little Help needed climbing 3-5 steps with a railing? : A Little 6 Click Score: 18    End of Session Equipment Utilized During Treatment: Gait belt Activity Tolerance: Patient tolerated treatment well Patient left: in bed;with call bell/phone  within reach;with bed alarm set Nurse Communication: Mobility status PT Visit Diagnosis: Other abnormalities of gait and mobility (R26.89);Difficulty in walking, not elsewhere classified (R26.2);Muscle weakness (generalized) (M62.81)    Time: 8869-8852 PT Time Calculation (min) (ACUTE ONLY): 17 min   Charges:   PT Evaluation $PT Eval Low Complexity: 1 Low PT Treatments $Therapeutic Activity: 8-22 mins PT General Charges $$ ACUTE PT VISIT: 1 Visit         Doyal Shams PT, DPT 1:18 PM,05/01/24

## 2024-05-01 NOTE — ED Notes (Signed)
 Pt assisted to toilet.  Pt had BM.  Pt cleaned, and clean brief placed on pt.

## 2024-05-01 NOTE — H&P (Signed)
 History and Physical    Patient: Donald [name] FMW:[id number] DOB: [birth year] DOA: 04/30/2024 DOS: the patient was seen and examined on 05/01/2024 PCP: Towana Iona CROME, NP  Patient coming from: Home - lives with wife; NOK: Wife, Coy Rochford, [phone]   Chief Complaint: AMS  HPI: Donald [name] is a 89 y.o. male with medical history significant of BPH, lung CA, COPD, HTN, HLD, and TIA who presented on 6/20 with AMS.   He reports that he is here because of pain.  He remains mildly confused.  He does acknowledge a fall about a week ago where he struck a cabinet with his head.  I called to speak with his wife without a response.  I then called his daughter.  She reports that he has mild dementia, occasionally forgets things.  Yesterday AM, he had an appointment at the [facility] to start him on medicaiton for CLL (6 hours to infuse).  Prior to this, he called her and said he was at the [facility] (although this was clearly not true).  The rest of the day, he seemed to be fine.  He was also ok after the infusion other than super tired; he was able to talk when prompted.  It was too hard to get him out of the car and get into the house.  He was also clearly confused.  She was finally able to get him into the house.  At some point, his wife (who also has dementia) said he went out of the house and people had to bring him home (uncertain about the accuracy of this).  He later slid off the couch into the the floor; EMS wanted to transport and he refused to go.  Later, he was sitting naked on the couch, reported I'm trying to get my socks on.  She got a call at 0200 asking if he had fallen, reported h/o SDH.  He is on Eliquis , last dose yesterday AM.    ER Course:  Carryover per ZIE:ejupzwu here with AMS, on Eliquis  for a fib, CT head shows what appears to be subacute versus chronic bilateral subdural hemorrhages that are new compared to previous. No indication to reverse his anticoagulation now  given they are not acute. Unclear if this was causing his altered mental status today. Does have history of dementia but sounds like per the daughter he was sitting on the couch naked trying to have a bowel movement like it was the toilet and this was abnormal for him. He is oriented x 3 currently. This would just be an obs.   Also patient has had intermittent bradycardia. When he is sleeping heart rate is in the mid 30s. Awake it is in the 50s. He is in A-fib      Review of Systems: As mentioned in the history of present illness. All other systems reviewed and are negative. Past Medical History:  Diagnosis Date   Anemia    Arthritis    BPH (benign prostatic hyperplasia)    Cancer (HCC)    Lung   COPD (chronic obstructive pulmonary disease) (HCC)    Esophageal reflux    Hyperlipidemia    Hypertension    Internal carotid artery stenosis, right    Neuropathy of both feet    Pneumonia    recurrent   PTSD (post-traumatic stress disorder)    [place] vet   Sebaceous cyst    SOB (shortness of breath) on exertion    TIA (transient ischemic attack)  Urge incontinence    Past Surgical History:  Procedure Laterality Date   CYST EXCISION     buttocks   ELECTROMAGNETIC NAVIGATION BROCHOSCOPY Left 08/23/2019   Procedure: ELECTROMAGNETIC NAVIGATION BRONCHOSCOPY;  Surgeon: Tamea Dedra CROME, MD;  Location: [facility] ORS;  Service: Cardiopulmonary;  Laterality: Left;   ENDOBRONCHIAL ULTRASOUND N/A 08/23/2019   Procedure: ENDOBRONCHIAL ULTRASOUND;  Surgeon: Tamea Dedra CROME, MD;  Location: [facility] ORS;  Service: Cardiopulmonary;  Laterality: N/A;   HEMORRHOID SURGERY     shave biopsy  03/13/2020   left neck 03/13/20 [place] [facility] Mulligan wart with atypica inflamed +AK no carcinoma    SINUSOTOMY     VIDEO BRONCHOSCOPY WITH ENDOBRONCHIAL NAVIGATION Left 06/26/2020   Procedure: VIDEO BRONCHOSCOPY WITH ENDOBRONCHIAL NAVIGATION;  Surgeon: Tamea Dedra CROME, MD;  Location: [facility] ORS;  Service:  Pulmonary;  Laterality: Left;   Social History:  reports that he quit smoking about 24 years ago. His smoking use included cigarettes. He started smoking about 74 years ago. He has a 75 pack-year smoking history. He has never used smokeless tobacco. He reports that he does not drink alcohol and does not use drugs.  Allergies  Allergen Reactions   Lovastatin Other (See Comments)   Cephalexin Rash and Hives    Family History  Problem Relation Age of Onset   Heart disease Mother    Cancer Maternal Aunt        lung   Lung cancer Maternal Aunt    Leukemia Maternal Aunt    Hypertension Son     Prior to Admission medications   Medication Sig Start Date End Date Taking? Authorizing Provider  acetaminophen  (TYLENOL ) 325 MG tablet Take 2 tablets (650 mg total) by mouth every 6 (six) hours as needed. 09/26/23  Yes Tobie Calix, MD  albuterol  (VENTOLIN  HFA) 108 (90 Base) MCG/ACT inhaler Inhale 2 puffs into the lungs every 6 (six) hours as needed for wheezing or shortness of breath. 08/12/19  Yes Tamea Dedra CROME, MD  amLODipine  (NORVASC ) 10 MG tablet Take 1 tablet (10 mg total) by mouth daily. 09/27/23  Yes Patel, Sona, MD  apixaban  (ELIQUIS ) 2.5 MG TABS tablet Take 1 tablet (2.5 mg total) by mouth 2 (two) times daily. 05/31/21  Yes McLean-Scocuzza, Randine SAILOR, MD  atorvastatin  (LIPITOR ) 80 MG tablet Take 40 mg by mouth at bedtime. 03/25/23  Yes [provider]  buPROPion  (WELLBUTRIN  XL) 150 MG 24 hr tablet Take 150 mg by mouth daily. 01/27/24  Yes [provider]  DULoxetine  (CYMBALTA ) 30 MG capsule Take 30 mg by mouth 2 (two) times daily.   Yes [provider]  famotidine  (PEPCID ) 20 MG tablet Take 20 mg by mouth daily as needed for indigestion or heartburn. 03/25/23  Yes [provider]  finasteride  (PROSCAR ) 5 MG tablet Take 5 mg by mouth daily.   Yes [provider]  furosemide  (LASIX ) 40 MG tablet Take 1 tablet (40 mg total) by mouth daily. 04/17/24 05/17/24  Yes Maree Hue, MD  hydrALAZINE  (APRESOLINE ) 25 MG tablet Take 1 tablet (25 mg total) by mouth 3 (three) times daily. 04/16/24 05/16/24 Yes Shah, Vipul, MD  ipratropium (ATROVENT ) 0.03 % nasal spray Place 2 sprays into both nostrils daily. 12/24/22  Yes [provider]  metoprolol  tartrate (LOPRESSOR ) 25 MG tablet Take 25 mg by mouth 2 (two) times daily. 03/25/23  Yes [provider]  Multiple Vitamins-Minerals (MEGA MULTIVITAMIN FOR MEN) TABS Take 1 tablet by mouth daily.   Yes [provider]  tamsulosin  (  FLOMAX ) 0.4 MG CAPS capsule Take 0.4 mg by mouth every evening. 03/25/23  Yes [provider]  valACYclovir  (VALTREX ) 500 MG tablet Take 500 mg by mouth every other day. 12/24/23  Yes [provider]  lisinopril  (ZESTRIL ) 10 MG tablet Take 5 mg by mouth daily. Patient not taking: Reported on 05/01/2024 12/24/23   [provider]  loperamide  (IMODIUM  A-D) 2 MG tablet Take 1 tablet (2 mg total) by mouth 4 (four) times daily as needed for diarrhea or loose stools. Patient not taking: Reported on 05/01/2024 04/17/24   Bradler, Evan K, MD  Tiotropium Bromide -Olodaterol 2.5-2.5 MCG/ACT AERS Inhale 2 puffs into the lungs daily. Patient not taking: Reported on 05/01/2024 02/28/20   McLean-Scocuzza, Randine SAILOR, MD    Physical Exam: Vitals:   05/01/24 0730 05/01/24 0745 05/01/24 0800 05/01/24 0926  BP: (!) 162/57  (!) 159/61 (!) 156/55  Pulse:  62 62   Resp: 18 13 13 16   Temp:      TempSrc:      SpO2:  100% 100%   Weight:      Height:       General:  Appears calm and comfortable and is in NAD Eyes:  EOMI, normal lids, iris ENT:  grossly normal hearing, lips & tongue, mmm Neck:  no LAD, masses or thyromegaly Cardiovascular:  RRR. No LE edema.  Respiratory:   CTA bilaterally with no wheezes/rales/rhonchi.  Normal respiratory effort. Abdomen:  soft, NT, ND Skin:  no rash or induration seen on limited exam Musculoskeletal:  grossly normal tone BUE/BLE,  good ROM, no bony abnormality Psychiatric:  pleasant mood and affect, speech fluent and appropriate, AOx1-2 Neurologic:  CN 2-12 grossly intact, moves all extremities in coordinated fashion  Radiological Exams on Admission: Independently reviewed - see discussion in A/P where applicable  CT HEAD WO CONTRAST ( ) Result Date: 05/01/2024 EXAM: CT HEAD WITHOUT CONTRAST 05/01/2024 07:22:25 AM TECHNIQUE: CT of the head was performed without the administration of intravenous contrast. Automated exposure control, iterative reconstruction, and/or weight based adjustment of the mA/kV was utilized to reduce the radiation dose to as low as reasonably achievable. COMPARISON: CT head without contrast 05/01/2024 at 12:15 am. MRI head without contrast 09/25/2023. CLINICAL HISTORY: Follow-up bilateral subdural hematomas. FINDINGS: BRAIN AND VENTRICLES: The lentiform extraaxial hemorrhage over the right convexity is stable in size, measuring 13 mm. A 6 mm extraaxial hemorrhage over the left convexity is also stable. Gray-white differentiation is preserved. No hydrocephalus. No extra-axial collection. No mass effect or midline shift. Note atrophy and white matter disease is stable. Remote lacunar infarcts are present in the right corona radiata. ORBITS: Bilateral lens replacements are noted. The globes and orbits are otherwise within normal limits. SINUSES: No acute abnormality. SOFT TISSUES AND SKULL: No acute soft tissue abnormality. No skull fracture. No significant midline shift is present. IMPRESSION: 1. Stable lentiform extraaxial hemorrhage over the right convexity measuring 13 mm and 6 mm extraaxial hemorrhage over the left convexity. No significant midline shift. 2. Stable atrophy and white matter disease. Electronically signed by: Lonni Necessary MD 05/01/2024 07:29 AM EDT RP Workstation: [id number]   CT HEAD WO CONTRAST ( ) Result Date: 05/01/2024 CLINICAL DATA:  Altered mental status EXAM: CT HEAD  WITHOUT CONTRAST TECHNIQUE: Contiguous axial images were obtained from the base of the skull through the vertex without intravenous contrast. RADIATION DOSE REDUCTION: This exam was performed according to the departmental dose-optimization program which includes automated exposure control, adjustment of the mA and/or kV according to patient  size and/or use of iterative reconstruction technique. COMPARISON:  04/15/2022 head CT 09/25/2023 brain MRI FINDINGS: Brain: There is a right hemispheric mixed density, predominantly hypodense subdural collection measuring up to 15 mm in thickness. There is a smaller collection with similar characteristics on the left that measures 6 mm in thickness. No midline shift or significant mass effect. There is an old right basal ganglia small vessel infarct. Vascular: Calcification along the course of the right MCA. Skull: Normal. Negative for fracture or focal lesion. Sinuses/Orbits: No acute finding. Other: None IMPRESSION: Bilateral subacute to chronic subdural hematomas measuring 15 mm on the right and 6 mm on the left. Critical Value/emergent results were called by telephone at the time of interpretation on 05/01/2024 at 12:32 am to provider [facility] , who verbally acknowledged these results. Electronically Signed   By: Franky Stanford M.D.   On: 05/01/2024 00:32    EKG: Independently reviewed.  Atrial flutter with rate 80; no evidence of acute ischemia   Labs on Admission: I have personally reviewed the available labs and imaging studies at the time of the admission.  Pertinent labs:    CO2 20 Glucose 165 BUN 31/Creatinine 1.73/GFR 37 Albumin 3.3 HS troponin 18, 19 WBC 10 Hgb 10.3 TSH 1.118 COVID/flu/RSV negative UA: moderate Hgb, rare bacteria   Assessment and Plan: Principal Problem:   Acute toxic and metabolic encephalopathy Active Problems:   COPD   Atrial fibrillation, chronic (HCC)   CLL   Obesity (BMI 30-39.9)   Frequent falls   Hypertension  associated with diabetes (HCC)   Stage 3b chronic kidney disease (HCC)   Subdural hematoma (HCC)    Acute encephalopathy Patient with baseline dementia Received initial treatment for CLL at the [facility] yesterday and had profound fatigue with confusion to follow He currently appears to be back to baseline He was found to have subacute vs. Chronic SDH - see below At this time, this is considered to be less likely related to his presentation He as placed in observation status overnight Daughter is planning to come in to assess If she believes that he is back to relative baseline and is comfortable, will dc to home today  SDH Patient with apparent fall at home given R periorbital ecchymosis (remote); he acknowledges a fall with head trauma within the last week B subacute to chronic SDH on CT Stable on repeat Will await neurosurgery input but he appears to be stable for dc at this time He is on Eliquis  and this does not appear to need reversal but likely need to hold Eliquis ; will await neurosurgical input about this as well  Frequent falls ASAP PT evaluation in the ER to ensure stability Fall precautions  CLL Initial encounter at the [facility] for rituximab-PVVR yesterday While it is impossible to say whether his AMS was related to fatigue following 6 hours of infusion or to the drug itself, this was likely related to his presentation Will defer to family/[facility] about whether to continue infusions  COPD Continue albuterol  He is no longer taking tiotropium  BPH Continue finasteride , tamsulosin   HTN Continue amlodipine , metoprolol , hydralazine   HLD Continue atorvastatin   Chronic afib Rate controlled with metoprolol  Hold Eliquis  for now given SDH  Dementia Daughter reports MCI at baseline Worsened confusion and generalized weakness led to presentation, as above Continue bupropion , Cymbalta   Stage 3b CKD Appears to be stable at this time Attempt to avoid nephrotoxic  medications  Class 1 obesity Body mass index is 30.87 kg/m.SABRA  Weight loss  should be encouraged Outpatient PCP/bariatric medicine f/u encouraged Significantly low or high BMI is associated with higher medical risk including morbidity and mortality     Advance Care Planning:   Code Status: Full Code - Code status was discussed with the patient at the time of admission.  The patient would want to receive full resuscitative measures at this time.   Consults: Neurosurgery (telephone only); PT  DVT Prophylaxis: SCDs  Family Communication: None present; I spoke with his daughter by telephone  Severity of Illness: The appropriate patient status for this patient is OBSERVATION. Observation status is judged to be reasonable and necessary in order to provide the required intensity of service to ensure the patient's safety. The patient's presenting symptoms, physical exam findings, and initial radiographic and laboratory data in the context of their medical condition is felt to place them at decreased risk for further clinical deterioration. Furthermore, it is anticipated that the patient will be medically stable for discharge from the hospital within 2 midnights of admission.   Author: Delon Herald, MD 05/01/2024 10:38 AM  For on call review [email or web address].

## 2024-05-04 ENCOUNTER — Telehealth: Payer: Self-pay | Admitting: Neurosurgery

## 2024-05-04 DIAGNOSIS — S065XAA Traumatic subdural hemorrhage with loss of consciousness status unknown, initial encounter: Secondary | ICD-10-CM

## 2024-05-04 NOTE — Telephone Encounter (Signed)
 FYI

## 2024-05-04 NOTE — Telephone Encounter (Signed)
 Patient will be continuing care at the [facility] - he will not need any follow up visits with our office.

## 2024-05-04 NOTE — Telephone Encounter (Signed)
-----   Message from Penne LELON Sharps sent at 05/04/2024  7:58 AM EDT ----- Regarding: follow up with repeat head ct Clinic: [facility] Timeline: 2-4 weeks Tests to order: ct noncon

## 2024-05-04 NOTE — Telephone Encounter (Signed)
 CT has been ordered, I will send a message to scheduling to get this scheduled. Please schedule a follow up appt for the patient.

## 2024-05-04 NOTE — Telephone Encounter (Signed)
 Please order the following testing below

## 2024-05-19 ENCOUNTER — Other Ambulatory Visit: Payer: Self-pay

## 2024-05-19 DIAGNOSIS — C9110 Chronic lymphocytic leukemia of B-cell type not having achieved remission: Secondary | ICD-10-CM

## 2024-05-25 ENCOUNTER — Ambulatory Visit: Admitting: Physician Assistant

## 2024-05-25 ENCOUNTER — Ambulatory Visit

## 2024-07-14 ENCOUNTER — Ambulatory Visit: Payer: Self-pay

## 2024-07-14 ENCOUNTER — Ambulatory Visit

## 2024-07-30 ENCOUNTER — Ambulatory Visit
Admission: RE | Admit: 2024-07-30 | Discharge: 2024-07-30 | Disposition: A | Discharge status: Home / Self Care | Source: Ambulatory Visit | Attending: Pathology | Admitting: Pathology

## 2024-07-30 DIAGNOSIS — E119 Type 2 diabetes mellitus without complications: Secondary | ICD-10-CM | POA: Diagnosis not present

## 2024-07-30 DIAGNOSIS — R918 Other nonspecific abnormal finding of lung field: Secondary | ICD-10-CM | POA: Diagnosis not present

## 2024-07-30 DIAGNOSIS — C9110 Chronic lymphocytic leukemia of B-cell type not having achieved remission: Secondary | ICD-10-CM | POA: Diagnosis not present

## 2024-07-30 DIAGNOSIS — E7253 Hyperoxaluria: Secondary | ICD-10-CM | POA: Diagnosis present

## 2024-07-30 DIAGNOSIS — Z85118 Personal history of other malignant neoplasm of bronchus and lung: Secondary | ICD-10-CM | POA: Diagnosis not present

## 2024-07-30 DIAGNOSIS — W19XXXA Unspecified fall, initial encounter: Secondary | ICD-10-CM | POA: Diagnosis not present

## 2024-07-30 LAB — GLUCOSE, CAPILLARY: Glucose-Capillary: 98 mg/dL (ref 70–99)

## 2024-07-30 MED ORDER — FLUDEOXYGLUCOSE F - 18 (FDG) INJECTION
11.1100 | Freq: Once | INTRAVENOUS | Status: AC | PRN
Start: 2024-07-30 — End: 2024-07-30
  Administered 2024-07-30: 11.11 via INTRAVENOUS
